# Patient Record
Sex: Male | Born: 1945
Health system: Southern US, Community
[De-identification: ages and names within clinical notes are randomized; demographics above are authoritative.]

## PROBLEM LIST (undated history)

## (undated) DIAGNOSIS — I219 Acute myocardial infarction, unspecified: Secondary | ICD-10-CM

## (undated) DIAGNOSIS — R7301 Impaired fasting glucose: Secondary | ICD-10-CM

## (undated) DIAGNOSIS — I872 Venous insufficiency (chronic) (peripheral): Secondary | ICD-10-CM

## (undated) DIAGNOSIS — M1990 Unspecified osteoarthritis, unspecified site: Secondary | ICD-10-CM

## (undated) DIAGNOSIS — G4733 Obstructive sleep apnea (adult) (pediatric): Secondary | ICD-10-CM

## (undated) DIAGNOSIS — F32A Depression, unspecified: Secondary | ICD-10-CM

## (undated) DIAGNOSIS — C4491 Basal cell carcinoma of skin, unspecified: Secondary | ICD-10-CM

## (undated) DIAGNOSIS — G609 Hereditary and idiopathic neuropathy, unspecified: Secondary | ICD-10-CM

## (undated) DIAGNOSIS — D472 Monoclonal gammopathy: Secondary | ICD-10-CM

## (undated) DIAGNOSIS — N63 Unspecified lump in unspecified breast: Secondary | ICD-10-CM

## (undated) DIAGNOSIS — K219 Gastro-esophageal reflux disease without esophagitis: Secondary | ICD-10-CM

## (undated) DIAGNOSIS — N200 Calculus of kidney: Secondary | ICD-10-CM

## (undated) DIAGNOSIS — K635 Polyp of colon: Secondary | ICD-10-CM

## (undated) DIAGNOSIS — G629 Polyneuropathy, unspecified: Secondary | ICD-10-CM

## (undated) DIAGNOSIS — U071 COVID-19: Secondary | ICD-10-CM

## (undated) DIAGNOSIS — M75100 Unspecified rotator cuff tear or rupture of unspecified shoulder, not specified as traumatic: Secondary | ICD-10-CM

## (undated) DIAGNOSIS — Z973 Presence of spectacles and contact lenses: Secondary | ICD-10-CM

## (undated) DIAGNOSIS — K5790 Diverticulosis of intestine, part unspecified, without perforation or abscess without bleeding: Secondary | ICD-10-CM

## (undated) DIAGNOSIS — I209 Angina pectoris, unspecified: Secondary | ICD-10-CM

## (undated) DIAGNOSIS — R7303 Prediabetes: Secondary | ICD-10-CM

## (undated) DIAGNOSIS — I10 Essential (primary) hypertension: Secondary | ICD-10-CM

## (undated) DIAGNOSIS — E785 Hyperlipidemia, unspecified: Secondary | ICD-10-CM

## (undated) DIAGNOSIS — E875 Hyperkalemia: Secondary | ICD-10-CM

## (undated) DIAGNOSIS — G4730 Sleep apnea, unspecified: Secondary | ICD-10-CM

## (undated) DIAGNOSIS — I2510 Atherosclerotic heart disease of native coronary artery without angina pectoris: Secondary | ICD-10-CM

## (undated) DIAGNOSIS — F329 Major depressive disorder, single episode, unspecified: Secondary | ICD-10-CM

## (undated) HISTORY — DX: Impaired fasting glucose: R73.01

## (undated) HISTORY — DX: COVID-19: U07.1

## (undated) HISTORY — DX: Hyperlipidemia, unspecified: E78.5

## (undated) HISTORY — DX: Polyneuropathy, unspecified: G62.9

## (undated) HISTORY — DX: Hyperkalemia: E87.5

## (undated) HISTORY — DX: Monoclonal gammopathy: D47.2

## (undated) HISTORY — DX: Diverticulosis of intestine, part unspecified, without perforation or abscess without bleeding: K57.90

## (undated) HISTORY — DX: Obstructive sleep apnea (adult) (pediatric): G47.33

## (undated) HISTORY — DX: Venous insufficiency (chronic) (peripheral): I87.2

## (undated) HISTORY — DX: Unspecified rotator cuff tear or rupture of unspecified shoulder, not specified as traumatic: M75.100

## (undated) HISTORY — DX: Prediabetes: R73.03

## (undated) HISTORY — DX: Essential (primary) hypertension: I10

## (undated) HISTORY — DX: Calculus of kidney: N20.0

## (undated) HISTORY — PX: BASAL CELL CARCINOMA EXCISION: SHX1214

## (undated) HISTORY — DX: Hereditary and idiopathic neuropathy, unspecified: G60.9

## (undated) HISTORY — DX: Gastro-esophageal reflux disease without esophagitis: K21.9

## (undated) HISTORY — PX: CARDIAC CATHETERIZATION: SHX172

## (undated) HISTORY — DX: Unspecified lump in unspecified breast: N63.0

## (undated) HISTORY — DX: Acute myocardial infarction, unspecified: I21.9

## (undated) HISTORY — DX: Polyp of colon: K63.5

---

## 1985-02-15 HISTORY — PX: VASECTOMY: SHX75

## 1993-02-12 HISTORY — PX: SPINE SURGERY: SHX786

## 2001-02-15 HISTORY — PX: ANGIOPLASTY: SHX39

## 2001-11-15 ENCOUNTER — Inpatient Hospital Stay (HOSPITAL_COMMUNITY): Admission: EM | Admit: 2001-11-15 | Discharge: 2001-11-18 | Payer: Self-pay | Admitting: Emergency Medicine

## 2001-11-15 ENCOUNTER — Encounter: Payer: Self-pay | Admitting: Interventional Cardiology

## 2001-11-15 DIAGNOSIS — I219 Acute myocardial infarction, unspecified: Secondary | ICD-10-CM

## 2001-11-15 HISTORY — DX: Acute myocardial infarction, unspecified: I21.9

## 2001-11-15 HISTORY — PX: CORONARY STENT PLACEMENT: SHX6853

## 2001-11-15 HISTORY — PX: OTHER SURGICAL HISTORY: SHX169

## 2001-12-04 ENCOUNTER — Encounter (HOSPITAL_COMMUNITY): Admission: RE | Admit: 2001-12-04 | Discharge: 2002-03-04 | Payer: Self-pay | Admitting: Interventional Cardiology

## 2003-04-30 ENCOUNTER — Ambulatory Visit (HOSPITAL_BASED_OUTPATIENT_CLINIC_OR_DEPARTMENT_OTHER): Admission: RE | Admit: 2003-04-30 | Discharge: 2003-04-30 | Payer: Self-pay | Admitting: Family Medicine

## 2003-06-17 HISTORY — PX: COLONOSCOPY: SHX174

## 2009-02-15 HISTORY — PX: SHOULDER ARTHROSCOPY W/ ROTATOR CUFF REPAIR: SHX2400

## 2010-01-13 ENCOUNTER — Ambulatory Visit (HOSPITAL_BASED_OUTPATIENT_CLINIC_OR_DEPARTMENT_OTHER): Admission: RE | Admit: 2010-01-13 | Discharge: 2010-01-13 | Payer: Self-pay | Admitting: Orthopedic Surgery

## 2010-04-28 LAB — BASIC METABOLIC PANEL
BUN: 23 mg/dL (ref 6–23)
CO2: 30 mEq/L (ref 19–32)
Calcium: 8.9 mg/dL (ref 8.4–10.5)
Chloride: 108 mEq/L (ref 96–112)
Creatinine, Ser: 0.96 mg/dL (ref 0.4–1.5)
GFR calc Af Amer: >60 mL/min (ref >60)
GFR calc non Af Amer: >60 mL/min (ref >60)
Glucose, Bld: 163 mg/dL — ABNORMAL HIGH (ref 70–99)
Potassium: 4.2 mEq/L (ref 3.5–5.1)
Sodium: 141 mEq/L (ref 135–145)

## 2010-04-28 LAB — POCT HEMOGLOBIN-HEMACUE: Hemoglobin: 14.2 g/dL (ref 13.0–17.0)

## 2010-07-03 NOTE — H&P (Signed)
NAME:  AH, BOTT NO.:  [id number]   MEDICAL RECORD NO.:  [id number]                   PATIENT TYPE:   LOCATION:                                       FACILITY:  [facility]   PHYSICIAN:  Lyn Records, M.D.                DATE OF BIRTH:  05-29-1945   DATE OF ADMISSION:  11/15/2001  DATE OF DISCHARGE:                                HISTORY & PHYSICAL   PRIMARY CARE [place]:  Dr. Chales Salmon. [place] [employer].   IMPRESSION:  (As dictated by Dr. Lyn Records.)  1. Symptoms suspicious for unstable angina pectoris in this 65 year old with     cardiac risk factors including borderline hypertension, dyslipidemia and     possible family history of CAD.  Discomfort is improved after sublingual     nitrates and heparin as well as aspirin.  He has electrocardiographic     changes suggestive of inferior/lateral ischemia.  No significant change     in serial electrocardiograms.  2. Dyslipidemia for which he is on Lipitor.  3. History of gastroesophageal reflux disease for which he is on Nexium.   PLAN:  (As dictated by Dr. Verdis Prime.)  1. Cardiac catheterization this afternoon with possible percutaneous     intervention if indicated and able.  The risks, potential complications,     benefits and alternatives to procedure were discussed in detail with the     patient by Dr. [name].  The patient and his wife indicated their questions     and concerns have been addressed and is agreeable to proceed.  2. IV Integrilin, IV heparin and IV nitroglycerin.  He is status post     aspirin, 5000 unit heparin bolus by EMS.   HISTORY OF PRESENT ILLNESS:  The patient is a very pleasant 65 year old male  with history of hypertension (borderline, untreated), dyslipidemia and  possible family history of CAD.  At 11 a.m., while aerating his lawn, he  developed new onset of upper chest tightness/pressure, may have had some  slight left finger  tingling.  He denied associated shortness of breath,  nausea, nor diaphoresis.  He presented to his primary care Kalise Fickett's office  where EKG revealed T wave abnormalities, III, slight ST depression, V4  through V6, and ST segment downsloping with associated T wave abnormalities,  I and aVL.  EMS was summoned and he received a total of three sublingual  nitrates and 5000 of IV heparin bolus with some easing of discomfort,  initially 7/10 and brought down to 2-3/10; in the ER, his discomfort is down  to less than 1/10.  He had received aspirin in primary M.D. clinic.  In the  emergency room, he has very mild tightness, residual, and is in no acute  distress; he is a little anxious.  His ST segment depression in V4 through  V6  seems improved.   PREVIOUS MEDICAL HISTORY:  1. Moderate hypertension, not treated.  2. Dyslipidemia, on Lipitor.  3. GERD, on Nexium.   PREVIOUS SURGICAL HISTORY:  1. Left lower extremity vein ligation a few weeks earlier.  2. History of lower back surgery, December 1994, lumbar area.   ALLERGIES:  No known drug allergies.  No problems with seafood, shellfish  nor with iodinated products.   MEDICATIONS:  A.  Zocor 50 mg p.o. every day.  B.  Nexium 40 mg p.o. every day.  C.  Lipitor 10 mg p.o. every day.  D.  Baby aspirin 81 mg a day.  E.  Vitamin E, vitamin B1 and multivitamin every day.   SOCIAL HISTORY AND HABITS:  Tobacco:  Very remote, none currently.  ETOH:  __________.  The patient is married with children.   FAMILY HISTORY:  Father died of suicide at age 5, did have complaints of  prior angina.  Mother is without CAD.  One sister alive and well.   REVIEW OF SYSTEMS:  As in HPI/previous medical history, otherwise, wears  glasses, hearing okay, episodic dysphagia, negative history of stomach  ulcers or bleeds, history of GERD for which he takes Nexium, nothing like  presenting symptoms.  Denies constipation, diarrhea, melena nor bright red  blood  PR.  Negative dysuria nor hematuria.  Denies pedal edema, orthopnea,  PND.  Negative DOE.   PHYSICAL EXAMINATION:  (As performed by Dr. Verdis Prime.)  VITAL SIGNS:  Blood pressure is 112/68, heart rate 69 and regular,  respiratory rate 12, temperature 98.2.  GENERAL:  In general, he is a well-nourished, pleasantly conversant 65-year-  old gentleman who is a little anxious.  His wife is in attendance.  HEENT/NECK:  Brisk bilateral carotid upstrokes without bruit.  No  significant JVD nor thyromegaly.  CHEST:  Scant left basilar crackles, otherwise, clear.  CARDIAC:  Regular rate and rhythm without murmur, rub nor gallop.  Normal S1  and S2.  ABDOMEN:  Soft, nondistended, normoactive bowel sounds.  Negative abdominal  aortic, renal nor femoral bruit.  Nontender to applied pressure.  No masses  nor organomegaly appreciated.  EXTREMITIES:  Distal pulses intact.  Negative pedal edema.  NEUROLOGIC:  Cranial nerves II-XII grossly intact, alert and oriented x3.  GENITAL:  Deferred.  RECTAL:  Deferred.   LABORATORY TESTS AND DATA:  EKG revealed NSR at 66 beats per minute with  slight septal (V1) ST curving; terminal T wave abnormality, V3 and aVL; ST  downsloping, I, aVL, with associated T wave abnormalities.  Followup EKG in  [facility] ER is about the same; there is some improvement in the ST slight  depression, lateral precordial leads, however.   His i-STAT 8, CMET, CBC, coagulations, cardiac enzymes are drawn and  pending.       Salomon Fick, N.P.                       Lyn Records, M.D.    MES/MEDQ  D:  11/15/2001  T:  11/15/2001  Job:  [id number]   cc:   Chales Salmon. Abigail Miyamoto, M.D.

## 2010-07-03 NOTE — Discharge Summary (Signed)
NAME:  Tim Walters, Tim Walters NO.:  [id number]   MEDICAL RECORD NO.:  [id number]                   PATIENT TYPE:  INP   LOCATION:  3738                                 FACILITY:  [facility]   PHYSICIAN:  Lyn Records, M.D.                DATE OF BIRTH:  1946/01/19   DATE OF ADMISSION:  11/15/2001  DATE OF DISCHARGE:  11/18/2001                                 DISCHARGE SUMMARY   PRIMARY CARE PHYSICIAN:  Chales Salmon. Abigail Miyamoto, M.D., [facility].   PROCEDURE:  November 15, 2001, stent(x2) RCA mid.  Residual 70 to 80% mid LAD,  85% diagonal I.  Left-to-right collateral flow to RCA territory.  Overall  normal LV function.   DISCHARGE DIAGNOSES:  1. Coronary atherosclerotic heart disease.     a. Status post acute inferior myocardial infarction with peak CK of 728,        MB 78, troponin I 13.83.     b. November 15, 2001, stent x2 mid right coronary artery.  Residual 70 to        80% mid left anterior descending, 85% diagonal I.  Preserved left        ventricular function.  2. History of dyslipidemia for which he is on Lipitor.  Lipid profile     revealing cholesterol 137, triglycerides 89, HDL 39, LDL 80.  3. History of gastroesophageal reflux disease for which he is on Nexium.   PLAN:  1. The patient is discharged home in stable condition.  2. Discharge medications:     a. (New) Plavix 75 mg one p.o. q.d. Take with food for one month. Do not        miss any doses.     b. (New) Nitroglycerin tablet 0.4 mg one sublingual p.r.n. chest pain, up        to three tablets in 15 minutes.     c. (Increased)  Enteric coated aspirin 325 mg once daily.     d. (New) Altace 2.5 mg one p.o. q.d.     e. (New) Coreg 6.25 mg one half tablet p.o. b.i.d.     f. Lipitor 10 mg per day.     g. Zoloft 50 mg per day.     h. Nexium 40 mg per day.        a. Multivitamins as before.  3. Activity:  As outlined by cardiac rehab nurse. No heavy exertion until     after  being seen by Dr. [name] in clinic.  4. He is to have follow-up Cardiolite (perfusion scan of his heart) to see     if further work is needed on LAD lesion.  5. Diet:  Low fat, low cholesterol, no added salt.  6. Wound care:  He may shower.  7. Special instructions:  The patient is to call our clinic if he develops a  large amount of swelling or bruising in the groin area.  8. Follow-up:  Lyn Records, M.D., on Wednesday, November 29, 2001, at     12:30 p.m.  At that time will hold discussion if the patient can go on     plane flights.  Will also discuss if the patient needs to postpone left     lower extremity vascular injections for varicose veins pending completion     of work on LAD if necessary.   HISTORY OF PRESENT ILLNESS:  [name]  with cardiac risk factors including borderline hypertension, dyslipidemia,  and possible family history for CAD.  He presented with symptoms suspicious  for unstable angina consisting of new onset upper chest tightness/pressure  with some associated left finger tingling.  There were EKG abnormalities  suggesting inferior/lateral ischemia but no significant change in serial  enzymes.  His discomfort improved after sublingual nitrates and heparin as  well as aspirin.  He was counseled and accepted plans for coronary  angiography with possible PCI.   HOSPITAL COURSE:  He was taken to the catheterization lab on November 15, 2001, the day of admission and underwent coronary angiography as detailed  below.  1. Left main:  Normal.  2. LAD:  70 to 80% mid.  Diagonal I:  85%.  3. Circumflex:  Luminal irregularities up to 40% mid.  4. RCA:  100% mid.  Collaterals left-to-right.   The patient underwent successful PTCA/stent x2 to the mid RCA with reduction  of stenosis from 100% to 0% and establishment of TIMI-3 flow.   The rest of the hospital admission was uneventful; no recurrent chest pain  suggestive of angina.   Right groin with slight ecchymosis and small  hematoma.  Beta-blocker and ACE inhibitor were added to his regimen and were  well tolerated hemodynamically.  Dr. [name] raises question of possible LAD  PCI if ischemia is apparent on follow-up Cardiolite and no RCA restenosis.  The patient can ambulate up and about with cardiac rehab and there are plans  for outpatient cardiac rehab program in his future.  He is discharged home  with further details as above.   PAST MEDICAL HISTORY:  As above.   PAST SURGICAL HISTORY:  Left lower extremity vein ligation a few weeks prior  to admission.  History of lower back surgery in December of 1994, lumbar  area.   LABORATORY DATA:  Admission wbc 9.1, hemoglobin 14.8, hematocrit 44.8,  platelets 193.  Differential significant for left shift, neutrophils 87%,  absolute neutrophils at 7.8 (very slightly elevated).  Admission pro time  13.6, INR 1, PTT 113.  Sodium 138, potassium 3.9, chloride 104, CO2 28,  glucose 195; 107 on November 17, 2001.  BUN 17, creatinine 1.1 and stable  during the course of admission.  LFTs were within normal range.  First CK  276, MB fraction 4.6, troponin I 0.02.  Second CK 619, MB fraction 76.6,  troponin I 7.82.  Third CK 728, MB 78, troponin I 13.83.  Fourth MB 333, MB  15.3.  Lipid profile revealed cholesterol 137, triglycerides 89, HDL 39, LDL  80.   Admission chest x-ray revealed coarse bibasilar markings without acute or  superimposed abnormality.   Admission EKG revealed NSR at 56 beats per minute with slight septal (V1) S2  curving; terminal Q-wave abnormality, V3 and aVL; ST down sloping I, aVL  with associated T-wave abnormalities.  Follow-up EKG in [facility]  Room about the same though seemed improvement in the ST slight depression in  the lateral precordial leads, however.   Total time preparing this discharge was greater than 40 minutes including dictating this discharge summary, filling out and  reviewing discharge  instructions with the patient, providing and calling in prescriptions, and  arranging follow-up office visit.    Salomon Fick, N.P.                       Lyn Records, M.D.   MES/MEDQ  D:  12/13/2001  T:  12/14/2001  Job:  [id number]   cc:   Chales Salmon. Abigail Miyamoto, M.D.

## 2010-07-03 NOTE — Cardiovascular Report (Signed)
NAME:  Tim Walters, Tim Walters NO.:  [id number]   MEDICAL RECORD NO.:  [id number]                   PATIENT TYPE:  INP   LOCATION:  1825                                 FACILITY:  [facility]   PHYSICIAN:  Lesleigh Noe, M.D.            DATE OF BIRTH:  [birth year]   DATE OF PROCEDURE:  11/15/2001  DATE OF DISCHARGE:                              CARDIAC CATHETERIZATION   INDICATIONS FOR PROCEDURE:  Acute coronary syndrome with prolonged chest  pain and possible non-Q-wave myocardial infarction.   PROCEDURE PERFORMED:  1. Left heart catheterization.  2. Selective coronary angiography.  3. Left ventriculography.  4. Percutaneous transluminal coronary angioplasty and stent of totally     occluded acutely, right coronary.   DESCRIPTION OF PROCEDURE:  After informed consent, the patient was brought  to the catheterization lab under emergency circumstances.  A #6 French  sheath was started in the right femoral artery using the modified Seldinger  technique.  A #6 French A2 Multipurpose catheter was used for hemodynamic  recordings, left ventriculography, and selective left and right coronary  angiography.  After reviewing the cineangiograms, it was determined that the  right coronary was acutely occluded.   We felt that percutaneous intervention was necessary on the right coronary.  He received a double bolus followed by an infusion of Integrilin.  An  additional 3000 units of heparin was given.  ACT during the procedure was  292 seconds.  The ACT post procedure was 233 seconds.  We performed  angioplasty using a #6 French sidehole guide catheter, JR4.  A BMW wire was  used to cross the total occlusion.  A 2.5 x 20-mm long Maverick balloon was  used for predilatation and two 2.75-mm diameter, 20-mm long Zeta stents were  deployed, each for 13 atmospheres which was an effective diameter of 2.93  mm.  The stents overlapped in the mid vessel.  TIMI grade 3 flow  was  reestablished.  The result was quite nice with 0% stenosis noted.   RESULTS:  1. Hemodynamic data     A. Aortic pressure:  125/61.     B. Left ventricular pressure:  124/12 mmHg.  2. Left ventriculography:  The left ventricle was normal in size.  Overall     contractility was normal.  No significant wall motion abnormalities     noted.  3. Coronary angiography     A. Left main coronary:  The left main coronary artery was free of any        significant obstruction.     B. Left anterior descending coronary:  The left anterior descending        coronary artery contains 80% proximal/mid narrowing.  A large second        diagonal also contains high-grade obstruction.  A third diagonal is        also severely diseased.  The LAD in the  distal segment contains 80%        stenosis.     C. Circumflex artery:  The circumflex coronary artery is large.  It gives        a large bifurcating system to the inferolateral wall.  Irregularities        are noted in the proximal circumflex but no high-grade obstruction is        felt to be present in the circumflex or the marginals.     D. Right coronary:  Totally occluded in the mid vessel.  4. Percutaneous intervention:  The right coronary, totally occluded in the     mid vessel, reduced to 0% with TIMI grade 3 flow after percutaneous     intervention with overlapping stent implantation.   CONCLUSION:  1. Acute inferior infarction.  Total occlusion of the right coronary.  2. Successful percutaneous coronary intervention on the right coronary with     reduction in stenosis from 100% to 0% with TIMI grade 3 flow.  3. Moderate to severe proximal/mid left anterior descending artery and     diagonal.  4. Overall normal left ventricular function.   PLAN:  Medical therapy.  Look for restenosis of the right coronary.  If  develops restenosis, consider coronary artery bypass grafting.  If the right  coronary stays patent, consider intervention on the  right coronary.                                               Lesleigh Noe, M.D.    HWS/MEDQ  D:  11/15/2001  T:  11/19/2001  Job:  [id number]   cc:   Chales Salmon. Abigail Miyamoto, M.D.   Cardiac Catheterization Lab

## 2012-02-15 ENCOUNTER — Ambulatory Visit (INDEPENDENT_AMBULATORY_CARE_PROVIDER_SITE_OTHER): Payer: 59 | Admitting: *Deleted

## 2012-02-15 DIAGNOSIS — Z23 Encounter for immunization: Secondary | ICD-10-CM

## 2013-01-05 ENCOUNTER — Ambulatory Visit (INDEPENDENT_AMBULATORY_CARE_PROVIDER_SITE_OTHER): Payer: Medicare Other | Admitting: General Surgery

## 2013-01-15 ENCOUNTER — Encounter (INDEPENDENT_AMBULATORY_CARE_PROVIDER_SITE_OTHER): Payer: Self-pay | Admitting: General Surgery

## 2013-01-15 ENCOUNTER — Ambulatory Visit (INDEPENDENT_AMBULATORY_CARE_PROVIDER_SITE_OTHER): Payer: Medicare Other | Admitting: General Surgery

## 2013-01-15 ENCOUNTER — Encounter (INDEPENDENT_AMBULATORY_CARE_PROVIDER_SITE_OTHER): Payer: Self-pay

## 2013-01-15 VITALS — BP 142/84 | HR 62 | Temp 98.6°F | Resp 16 | Ht 67.5 in | Wt 196.6 lb

## 2013-01-15 DIAGNOSIS — K4090 Unilateral inguinal hernia, without obstruction or gangrene, not specified as recurrent: Secondary | ICD-10-CM | POA: Insufficient documentation

## 2013-01-15 DIAGNOSIS — I252 Old myocardial infarction: Secondary | ICD-10-CM | POA: Insufficient documentation

## 2013-01-15 DIAGNOSIS — I10 Essential (primary) hypertension: Secondary | ICD-10-CM

## 2013-01-15 DIAGNOSIS — L723 Sebaceous cyst: Secondary | ICD-10-CM | POA: Insufficient documentation

## 2013-01-15 DIAGNOSIS — I2510 Atherosclerotic heart disease of native coronary artery without angina pectoris: Secondary | ICD-10-CM | POA: Insufficient documentation

## 2013-01-15 DIAGNOSIS — F329 Major depressive disorder, single episode, unspecified: Secondary | ICD-10-CM

## 2013-01-15 NOTE — Patient Instructions (Signed)
We have discussed management of the sebaceous cyst of the left upper back and the left inguinal hernia.  We have decided to schedule elective surgery to excise the sebaceous cyst on your back under monitored sedation as an outpatient surgery.  We will not plan any surgery on the left inguinal hernia at this time, although we recognize that it will probably become necessary in the future.      Epidermal Cyst An epidermal cyst is sometimes called a sebaceous cyst, epidermal inclusion cyst, or infundibular cyst. These cysts usually contain a substance that looks "pasty" or "cheesy" and may have a bad smell. This substance is a protein called keratin. Epidermal cysts are usually found on the face, neck, or trunk. They may also occur in the vaginal area or other parts of the genitalia of both men and women. Epidermal cysts are usually small, painless, slow-growing bumps or lumps that move freely under the skin. It is important not to try to pop them. This may cause an infection and lead to tenderness and swelling. CAUSES  Epidermal cysts may be caused by a deep penetrating injury to the skin or a plugged hair follicle, often associated with acne. SYMPTOMS  Epidermal cysts can become inflamed and cause:  Redness.  Tenderness.  Increased temperature of the skin over the bumps or lumps.  Grayish-white, bad smelling material that drains from the bump or lump. DIAGNOSIS  Epidermal cysts are easily diagnosed by your caregiver during an exam. Rarely, a tissue sample (biopsy) may be taken to rule out other conditions that may resemble epidermal cysts. TREATMENT   Epidermal cysts often get better and disappear on their own. They are rarely ever cancerous.  If a cyst becomes infected, it may become inflamed and tender. This may require opening and draining the cyst. Treatment with antibiotics may be necessary. When the infection is gone, the cyst may be removed with minor surgery.  Small, inflamed  cysts can often be treated with antibiotics or by injecting steroid medicines.  Sometimes, epidermal cysts become large and bothersome. If this happens, surgical removal in your caregiver's office may be necessary. HOME CARE INSTRUCTIONS  Only take over-the-counter or prescription medicines as directed by your caregiver.  Take your antibiotics as directed. Finish them even if you start to feel better. SEEK MEDICAL CARE IF:   Your cyst becomes tender, red, or swollen.  Your condition is not improving or is getting worse.  You have any other questions or concerns. MAKE SURE YOU:  Understand these instructions.  Will watch your condition.  Will get help right away if you are not doing well or get worse. Document Released: 01/03/2004 Document Revised: 04/26/2011 Document Reviewed: 08/10/2010 [facility] Patient Information 2014 [place], [place].       Inguinal Hernia, Adult Muscles help keep everything in the body in its proper place. But if a weak spot in the muscles develops, something can poke through. That is called a hernia. When this happens in the lower part of the belly (abdomen), it is called an inguinal hernia. (It takes its name from a part of the body in this region called the inguinal canal.) A weak spot in the wall of muscles lets some fat or part of the small intestine bulge through. An inguinal hernia can develop at any age. Men get them more often than women. CAUSES  In adults, an inguinal hernia develops over time.  It can be triggered by:  Suddenly straining the muscles of the lower abdomen.  Lifting  heavy objects.  Straining to have a bowel movement. Difficult bowel movements (constipation) can lead to this.  Constant coughing. This may be caused by smoking or lung disease.  Being overweight.  Being pregnant.  Working at a job that requires long periods of standing or heavy lifting.  Having had an inguinal hernia before. One type can be an emergency  situation. It is called a strangulated inguinal hernia. It develops if part of the small intestine slips through the weak spot and cannot get back into the abdomen. The blood supply can be cut off. If that happens, part of the intestine may die. This situation requires emergency surgery. SYMPTOMS  Often, a small inguinal hernia has no symptoms. It is found when a healthcare provider does a physical exam. Larger hernias usually have symptoms.   In adults, symptoms may include:  A lump in the groin. This is easier to see when the person is standing. It might disappear when lying down.  In men, a lump in the scrotum.  Pain or burning in the groin. This occurs especially when lifting, straining or coughing.  A dull ache or feeling of pressure in the groin.  Signs of a strangulated hernia can include:  A bulge in the groin that becomes very painful and tender to the touch.  A bulge that turns red or purple.  Fever, nausea and vomiting.  Inability to have a bowel movement or to pass gas. DIAGNOSIS  To decide if you have an inguinal hernia, a healthcare provider will probably do a physical examination.  This will include asking questions about any symptoms you have noticed.  The healthcare provider might feel the groin area and ask you to cough. If an inguinal hernia is felt, the healthcare provider may try to slide it back into the abdomen.  Usually no other tests are needed. TREATMENT  Treatments can vary. The size of the hernia makes a difference. Options include:  Watchful waiting. This is often suggested if the hernia is small and you have had no symptoms.  No medical procedure will be done unless symptoms develop.  You will need to watch closely for symptoms. If any occur, contact your healthcare provider right away.  Surgery. This is used if the hernia is larger or you have symptoms.  Open surgery. This is usually an outpatient procedure (you will not stay overnight in a  hospital). An cut (incision) is made through the skin in the groin. The hernia is put back inside the abdomen. The weak area in the muscles is then repaired by herniorrhaphy or hernioplasty. Herniorrhaphy: in this type of surgery, the weak muscles are sewn back together. Hernioplasty: a patch or mesh is used to close the weak area in the abdominal wall.  Laparoscopy. In this procedure, a surgeon makes small incisions. A thin tube with a tiny video camera (called a laparoscope) is put into the abdomen. The surgeon repairs the hernia with mesh by looking with the video camera and using two long instruments. HOME CARE INSTRUCTIONS   After surgery to repair an inguinal hernia:  You will need to take pain medicine prescribed by your healthcare provider. Follow all directions carefully.  You will need to take care of the wound from the incision.  Your activity will be restricted for awhile. This will probably include no heavy lifting for several weeks. You also should not do anything too active for a few weeks. When you can return to work will depend on the type of  job that you have.  During "watchful waiting" periods, you should:  Maintain a healthy weight.  Eat a diet high in fiber (fruits, vegetables and whole grains).  Drink plenty of fluids to avoid constipation. This means drinking enough water and other liquids to keep your urine clear or pale yellow.  Do not lift heavy objects.  Do not stand for long periods of time.  Quit smoking. This should keep you from developing a frequent cough. SEEK MEDICAL CARE IF:   A bulge develops in your groin area.  You feel pain, a burning sensation or pressure in the groin. This might be worse if you are lifting or straining.  You develop a fever of more than 100.5 F (38.1 C). SEEK IMMEDIATE MEDICAL CARE IF:   Pain in the groin increases suddenly.  A bulge in the groin gets bigger suddenly and does not go down.  For men, there is sudden pain  in the scrotum. Or, the size of the scrotum increases.  A bulge in the groin area becomes red or purple and is painful to touch.  You have nausea or vomiting that does not go away.  You feel your heart beating much faster than normal.  You cannot have a bowel movement or pass gas.  You develop a fever of more than 102.0 F (38.9 C). Document Released: 06/20/2008 Document Revised: 04/26/2011 Document Reviewed: 06/20/2008 [facility] Patient Information 2014 [place], [place].

## 2013-01-15 NOTE — Progress Notes (Signed)
Patient ID: Tim Walters, male   DOB: 06-23-1945, 67 y.o.   MRN: [id number]  Chief Complaint  Patient presents with  . Mass    back mass  . Inguinal Hernia    HPI EUCLID Walters is a 67 y.o. male.  He is referred by Dr. Catha Gosselin for ligation of an asymptomatic subcutaneous hernia and a symptomatic sebaceous cyst of the upper left back. Dr. Verdis Prime is his cardiologist.  This gentleman had has noticed a enlarging cyst on his left upper back for 3 years. He says his wife occasionally presses on it to get material out of it but still growing. He would like this removed. A recent physical exam Dr. Clarene Duke identified a reducible left inguinal hernia and wanted that evaluated as well.  Comorbidities include myocardial infarction and coronary artery disease with stent placement in the RCA. Dr. [name] sees him annually. Hypertension. Unspecified sleep apnea. Hyperlipidemia. Depression. He has no history of prior hernia.  HPI  Past Medical History  Diagnosis Date  . GERD (gastroesophageal reflux disease)   . Hyperlipidemia   . Hypertension   . Myocardial infarction 11/15/2001    Dr. Verdis Prime [facility])    Past Surgical History  Procedure Laterality Date  . Joint replacement  01/13/2010    Right Rotator Cuff  . Spine surgery  02/12/1993    L2, L3 fragmented disc  . Vasectomy  1987  . Coronary artery stent  11/15/2001    No family history on file.  Social History History  Substance Use Topics  . Smoking status: Never Smoker   . Smokeless tobacco: Not on file  . Alcohol Use: .5 - 1 oz/week    1-2 drink(s) per week    No Known Allergies  Current Outpatient Prescriptions  Medication Sig Dispense Refill  . atorvastatin (LIPITOR) 40 MG tablet       . carvedilol (COREG) 12.5 MG tablet       . OMEPRAZOLE PO Take 20 mg by mouth.      . ramipril (ALTACE) 2.5 MG capsule       . sertraline (ZOLOFT) 100 MG tablet        No current facility-administered medications for  this visit.    Review of Systems Review of Systems  Constitutional: Negative for fever, chills and unexpected weight change.  HENT: Negative for congestion, hearing loss, sore throat, trouble swallowing and voice change.   Eyes: Negative for visual disturbance.  Respiratory: Negative for cough and wheezing.   Cardiovascular: Negative for chest pain, palpitations and leg swelling.  Gastrointestinal: Negative for nausea, vomiting, abdominal pain, diarrhea, constipation, blood in stool, abdominal distention, anal bleeding and rectal pain.  Genitourinary: Negative for hematuria and difficulty urinating.  Musculoskeletal: Negative for arthralgias.  Skin: Negative for rash and wound.  Neurological: Negative for seizures, syncope, weakness and headaches.  Hematological: Negative for adenopathy. Does not bruise/bleed easily.  Psychiatric/Behavioral: Negative for confusion.    Blood pressure 142/84, pulse 62, temperature 98.6 F (37 C), temperature source Temporal, resp. rate 16, height 5' 7.5" (1.715 m), weight 196 lb 9.6 oz (89.177 kg).  Physical Exam Physical Exam  Constitutional: He is oriented to person, place, and time. He appears well-developed and well-nourished. No distress.  HENT:  Head: Normocephalic.  Nose: Nose normal.  Mouth/Throat: No oropharyngeal exudate.  Eyes: Conjunctivae and EOM are normal. Pupils are equal, round, and reactive to light. Right eye exhibits no discharge. Left eye exhibits no discharge. No scleral icterus.  Neck: Normal range of motion. Neck supple. No JVD present. No tracheal deviation present. No thyromegaly present.  Cardiovascular: Normal rate, regular rhythm, normal heart sounds and intact distal pulses.   No murmur heard. Pulmonary/Chest: Effort normal and breath sounds normal. No stridor. No respiratory distress. He has no wheezes. He has no rales. He exhibits no tenderness.  2.5 cm multilobulated subcutaneous mass left upper back. Not infected.  This has the physical characteristics of an epidermoid cyst.  Abdominal: Soft. Bowel sounds are normal. He exhibits no distension and no mass. There is no tenderness. There is no rebound and no guarding.  Umbilicus normal. No umbilical hernia.  Genitourinary:  Small to medium sized left inguinal hernia. Easily reducible. No evidence of hernia on the right. Penis scrotum testes are normal. No scrotal mass.  Musculoskeletal: Normal range of motion. He exhibits no edema and no tenderness.  Lymphadenopathy:    He has no cervical adenopathy.  Neurological: He is alert and oriented to person, place, and time. He has normal reflexes. Coordination normal.  Skin: Skin is warm and dry. No rash noted. He is not diaphoretic. No erythema. No pallor.  Psychiatric: He has a normal mood and affect. His behavior is normal. Judgment and thought content normal.    Data Reviewed Office notes from Dr. Clarene Duke.  Assessment    Symptomatic, enlarging epidermoid cyst left upper back.  Reducible left inguinal hernia. Not symptomatic.  Coronary artery disease  History myocardial infarction with stent placement  Hypertension  Unspecified sleep apnea  Depression  Hyperlipidemia     Plan    We talked about the sebaceous cyst and the hernia. It was his desire to get the cyst taken care of first and do the hernia at a  later date. That is very appropriate.  He was scheduled for elective excision of sebaceous cyst of his left upper back in the prone position sometime early next year after [place]. He would like to be sedated for this which is reasonable. We will try to set this up at [facility]  day surgery center as outpatient surgery  I discussed the indications, details, techniques, numerous risk of the surgery with him. He understands all these issues. At this time all of his questions are answered. It is planned.  Also spent a long time talking to her about about the natural history  of his inguinal hernia. I gave him patient information booklets with diagrams. He understands all this and we'll consider having his hernia repair as an open procedure in the future.        Angelia Mould. Derrell Lolling, M.D., [facility] General and Minimally invasive Surgery Breast and Colorectal Surgery Office:   [phone] Pager:   [phone]  01/15/2013, 3:53 PM

## 2013-05-03 ENCOUNTER — Encounter (HOSPITAL_BASED_OUTPATIENT_CLINIC_OR_DEPARTMENT_OTHER): Payer: Self-pay | Admitting: *Deleted

## 2013-05-03 NOTE — Progress Notes (Signed)
05/03/13 1637  OBSTRUCTIVE SLEEP APNEA  Have you ever been diagnosed with sleep apnea through a sleep study? No  Do you snore loudly (loud enough to be heard through closed doors)?  1  Do you often feel tired, fatigued, or sleepy during the daytime? 0  Has anyone observed you stop breathing during your sleep? 0  Do you have, or are you being treated for high blood pressure? 1  BMI more than 35 kg/m2? 0  Age over 70 years old? 1  Neck circumference greater than 40 cm/18 inches? 0  Gender: 1  Obstructive Sleep Apnea Score 4  Score 4 or greater  Results sent to PCP   

## 2013-05-03 NOTE — Progress Notes (Signed)
To come in for bmet-had ekg dr Tamala Julian 4/14- Was here 2011 for shoulder-stayed rcc-did well

## 2013-05-03 NOTE — Progress Notes (Signed)
05/03/13 1637  OBSTRUCTIVE SLEEP APNEA  Have you ever been diagnosed with sleep apnea through a sleep study? No  Do you snore loudly (loud enough to be heard through closed doors)?  1  Do you often feel tired, fatigued, or sleepy during the daytime? 0  Has anyone observed you stop breathing during your sleep? 0  Do you have, or are you being treated for high blood pressure? 1  BMI more than 35 kg/m2? 0  Age over 70 years old? 1  Neck circumference greater than 40 cm/18 inches? 0  Gender: 1  Obstructive Sleep Apnea Score 4  Score 4 or greater  Results sent to PCP

## 2013-05-07 ENCOUNTER — Encounter (HOSPITAL_BASED_OUTPATIENT_CLINIC_OR_DEPARTMENT_OTHER)
Admission: RE | Admit: 2013-05-07 | Discharge: 2013-05-07 | Disposition: A | Discharge status: Home / Self Care | Payer: Medicare Other | Source: Ambulatory Visit | Attending: General Surgery | Admitting: General Surgery

## 2013-05-07 LAB — BASIC METABOLIC PANEL
BUN: 26 mg/dL — ABNORMAL HIGH (ref 6–23)
CO2: 26 mEq/L (ref 19–32)
Calcium: 9.6 mg/dL (ref 8.4–10.5)
Chloride: 103 mEq/L (ref 96–112)
Creatinine, Ser: 0.89 mg/dL (ref 0.50–1.35)
GFR calc Af Amer: >90 mL/min (ref >90)
GFR calc non Af Amer: 85 mL/min — ABNORMAL LOW (ref >90)
Glucose, Bld: 100 mg/dL — ABNORMAL HIGH (ref 70–99)
Potassium: 4.6 mEq/L (ref 3.7–5.3)
Sodium: 140 mEq/L (ref 137–147)

## 2013-05-07 NOTE — H&P (Signed)
Tim Walters   MRN:  [id number]   Description: 68 year old male  Provider: Adin Hector, MD  Department: [facility]           Diagnoses      Sebaceous cyst    -  Primary      706.2      Left inguinal hernia          550.90      History of myocardial infarction          18      Essential hypertension, benign          401.1      Depression          311      Coronary atherosclerosis of native coronary artery          414.01             Reason for Visit      Mass      back mass      Inguinal Hernia              Current Vitals - Last Recorded      BP Pulse Temp(Src) Resp Ht Wt      142/84 62 98.6 F (37 C) (Temporal) 16 5' 7.5" (1.715 m) 196 lb 9.6 oz (89.177 kg)            BMI  30.32 kg/m2                      History and Physical     Adin Hector, MD      Status: Signed            Patient ID: Tim Walters, male   DOB: 1945-02-25, 68 y.o.   MRN: [id number]                 HPI Tim Walters is a 68 y.o. male.  He is referred by Dr. Hulan Fess for ligation of an asymptomatic subcutaneous hernia and a symptomatic sebaceous cyst of the upper left back. Dr. Daneen Schick is his cardiologist.   This gentleman had has noticed a enlarging cyst on his left upper back for 3 years. He says his wife occasionally presses on it to get material out of it but still growing. He would like this removed. A recent physical exam Dr. Rex Kras identified a reducible left inguinal hernia and wanted that evaluated as well.   Comorbidities include myocardial infarction and coronary artery disease with stent placement in the RCA. Dr. Tamala Julian sees him annually. Hypertension. Unspecified sleep apnea. Hyperlipidemia. Depression. He has no history of prior hernia.         Past Medical History   Diagnosis  Date   .  GERD (gastroesophageal reflux disease)     .  Hyperlipidemia     .  Hypertension     .  Myocardial infarction  11/15/2001       Dr. Daneen Schick  [facility])         Past Surgical History   Procedure  Laterality  Date   .  Joint replacement    01/13/2010       Right Rotator Cuff   .  Spine surgery    02/12/1993       L2, L3 fragmented disc   .  Vasectomy    1987   .  Coronary artery stent  11/15/2001        No family history on file.   Social History History   Substance Use Topics   .  Smoking status:  Never Smoker    .  Smokeless tobacco:  Not on file   .  Alcohol Use:  .5 - 1 oz/week       1-2 drink(s) per week        No Known Allergies    Current Outpatient Prescriptions   Medication  Sig  Dispense  Refill   .  atorvastatin (LIPITOR) 40 MG tablet           .  carvedilol (COREG) 12.5 MG tablet           .  OMEPRAZOLE PO  Take 20 mg by mouth.         .  ramipril (ALTACE) 2.5 MG capsule           .  sertraline (ZOLOFT) 100 MG tablet               No current facility-administered medications for this visit.        Review of Systems   Constitutional: Negative for fever, chills and unexpected weight change.  HENT: Negative for congestion, hearing loss, sore throat, trouble swallowing and voice change.   Eyes: Negative for visual disturbance.  Respiratory: Negative for cough and wheezing.   Cardiovascular: Negative for chest pain, palpitations and leg swelling.  Gastrointestinal: Negative for nausea, vomiting, abdominal pain, diarrhea, constipation, blood in stool, abdominal distention, anal bleeding and rectal pain.  Genitourinary: Negative for hematuria and difficulty urinating.  Musculoskeletal: Negative for arthralgias.  Skin: Negative for rash and wound.  Neurological: Negative for seizures, syncope, weakness and headaches.  Hematological: Negative for adenopathy. Does not bruise/bleed easily.  Psychiatric/Behavioral: Negative for confusion.      Blood pressure 142/84, pulse 62, temperature 98.6 F (37 C), temperature source Temporal, resp. rate 16, height 5' 7.5" (1.715 m), weight  196 lb 9.6 oz (89.177 kg).   Physical Exam  Constitutional: He is oriented to person, place, and time. He appears well-developed and well-nourished. No distress.  HENT:   Head: Normocephalic.   Nose: Nose normal.   Mouth/Throat: No oropharyngeal exudate.  Eyes: Conjunctivae and EOM are normal. Pupils are equal, round, and reactive to light. Right eye exhibits no discharge. Left eye exhibits no discharge. No scleral icterus.  Neck: Normal range of motion. Neck supple. No JVD present. No tracheal deviation present. No thyromegaly present.  Cardiovascular: Normal rate, regular rhythm, normal heart sounds and intact distal pulses.    No murmur heard. Pulmonary/Chest: Effort normal and breath sounds normal. No stridor. No respiratory distress. He has no wheezes. He has no rales. He exhibits no tenderness.  2.5 cm multilobulated subcutaneous mass left upper back. Not infected. This has the physical characteristics of an epidermoid cyst.  Abdominal: Soft. Bowel sounds are normal. He exhibits no distension and no mass. There is no tenderness. There is no rebound and no guarding.  Umbilicus normal. No umbilical hernia.  Genitourinary:  Small to medium sized left inguinal hernia. Easily reducible. No evidence of hernia on the right. Penis scrotum testes are normal. No scrotal mass.  Musculoskeletal: Normal range of motion. He exhibits no edema and no tenderness.  Lymphadenopathy:    He has no cervical adenopathy.  Neurological: He is alert and oriented to person, place, and time. He has normal reflexes. Coordination normal.  Skin: Skin is warm and dry. No rash  noted. He is not diaphoretic. No erythema. No pallor.  Psychiatric: He has a normal mood and affect. His behavior is normal. Judgment and thought content normal.      Data Reviewed Office notes from Dr. Rex Kras.   Assessment    Symptomatic, enlarging epidermoid cyst left upper back.   Reducible left inguinal hernia. Not  symptomatic.   Coronary artery disease   History myocardial infarction with stent placement   Hypertension   Unspecified sleep apnea   Depression   Hyperlipidemia      Plan    We talked about the sebaceous cyst and the hernia. It was his desire to get the cyst taken care of first and do the hernia at a  later date. That is very appropriate.   He was scheduled for elective excision of sebaceous cyst of his left upper back in the prone position sometime early next year after [place] returns [place]. He would like to be sedated for this which is reasonable. We will try to set this up at [facility]  day surgery center as outpatient surgery   I discussed the indications, details, techniques, numerous risk of the surgery with him. He understands all these issues. At this time all of his questions are answered. It is planned.   Also spent a long time talking to her about about the natural history of his inguinal hernia. I gave him patient information booklets with diagrams. He understands all this and we'll consider having his hernia repair as an open procedure in the future.           Edsel Petrin. Dalbert Batman, M.D., [facility] General and Minimally invasive Surgery Breast and Colorectal Surgery Office:   [phone] Pager:   [phone]

## 2013-05-08 ENCOUNTER — Encounter (HOSPITAL_BASED_OUTPATIENT_CLINIC_OR_DEPARTMENT_OTHER)
Admission: RE | Disposition: A | Discharge status: Home / Self Care | Payer: Self-pay | Source: Ambulatory Visit | Attending: General Surgery

## 2013-05-08 ENCOUNTER — Encounter (HOSPITAL_BASED_OUTPATIENT_CLINIC_OR_DEPARTMENT_OTHER): Payer: Medicare Other | Admitting: Anesthesiology

## 2013-05-08 ENCOUNTER — Encounter (HOSPITAL_BASED_OUTPATIENT_CLINIC_OR_DEPARTMENT_OTHER): Payer: Self-pay | Admitting: *Deleted

## 2013-05-08 ENCOUNTER — Ambulatory Visit (HOSPITAL_BASED_OUTPATIENT_CLINIC_OR_DEPARTMENT_OTHER)
Admission: RE | Admit: 2013-05-08 | Discharge: 2013-05-08 | Disposition: A | Discharge status: Home / Self Care | Payer: Medicare Other | Source: Ambulatory Visit | Attending: General Surgery | Admitting: General Surgery

## 2013-05-08 ENCOUNTER — Ambulatory Visit (HOSPITAL_BASED_OUTPATIENT_CLINIC_OR_DEPARTMENT_OTHER): Payer: Medicare Other | Admitting: Anesthesiology

## 2013-05-08 DIAGNOSIS — K219 Gastro-esophageal reflux disease without esophagitis: Secondary | ICD-10-CM | POA: Insufficient documentation

## 2013-05-08 DIAGNOSIS — K4090 Unilateral inguinal hernia, without obstruction or gangrene, not specified as recurrent: Secondary | ICD-10-CM | POA: Insufficient documentation

## 2013-05-08 DIAGNOSIS — L723 Sebaceous cyst: Secondary | ICD-10-CM

## 2013-05-08 DIAGNOSIS — I10 Essential (primary) hypertension: Secondary | ICD-10-CM | POA: Insufficient documentation

## 2013-05-08 DIAGNOSIS — G4730 Sleep apnea, unspecified: Secondary | ICD-10-CM | POA: Insufficient documentation

## 2013-05-08 DIAGNOSIS — Z9660 Presence of unspecified orthopedic joint implant: Secondary | ICD-10-CM | POA: Insufficient documentation

## 2013-05-08 DIAGNOSIS — E785 Hyperlipidemia, unspecified: Secondary | ICD-10-CM | POA: Insufficient documentation

## 2013-05-08 DIAGNOSIS — F329 Major depressive disorder, single episode, unspecified: Secondary | ICD-10-CM | POA: Insufficient documentation

## 2013-05-08 DIAGNOSIS — Z01812 Encounter for preprocedural laboratory examination: Secondary | ICD-10-CM | POA: Insufficient documentation

## 2013-05-08 DIAGNOSIS — Z9861 Coronary angioplasty status: Secondary | ICD-10-CM | POA: Insufficient documentation

## 2013-05-08 DIAGNOSIS — I252 Old myocardial infarction: Secondary | ICD-10-CM | POA: Insufficient documentation

## 2013-05-08 DIAGNOSIS — I2510 Atherosclerotic heart disease of native coronary artery without angina pectoris: Secondary | ICD-10-CM | POA: Insufficient documentation

## 2013-05-08 DIAGNOSIS — F3289 Other specified depressive episodes: Secondary | ICD-10-CM | POA: Insufficient documentation

## 2013-05-08 HISTORY — DX: Presence of spectacles and contact lenses: Z97.3

## 2013-05-08 HISTORY — DX: Major depressive disorder, single episode, unspecified: F32.9

## 2013-05-08 HISTORY — DX: Unspecified osteoarthritis, unspecified site: M19.90

## 2013-05-08 HISTORY — PX: CYST REMOVAL TRUNK: SHX6283

## 2013-05-08 HISTORY — DX: Atherosclerotic heart disease of native coronary artery without angina pectoris: I25.10

## 2013-05-08 HISTORY — DX: Depression, unspecified: F32.A

## 2013-05-08 HISTORY — DX: Sleep apnea, unspecified: G47.30

## 2013-05-08 LAB — POCT HEMOGLOBIN-HEMACUE: Hemoglobin: 15.9 g/dL (ref 13.0–17.0)

## 2013-05-08 SURGERY — CYST REMOVAL TRUNK
Anesthesia: Monitor Anesthesia Care | Laterality: Left

## 2013-05-08 MED ORDER — PROPOFOL INFUSION 10 MG/ML OPTIME
INTRAVENOUS | Status: DC | PRN
  Administered 2013-05-08: 100 ug/kg/min via INTRAVENOUS

## 2013-05-08 MED ORDER — FENTANYL CITRATE 0.05 MG/ML IJ SOLN: 50.0000 ug | INTRAMUSCULAR | Status: DC | PRN

## 2013-05-08 MED ORDER — MIDAZOLAM HCL 5 MG/5ML IJ SOLN
INTRAMUSCULAR | Status: DC | PRN
  Administered 2013-05-08 (×2): 1 mg via INTRAVENOUS

## 2013-05-08 MED ORDER — SODIUM BICARBONATE 4 % IV SOLN
INTRAVENOUS | Status: DC | PRN
  Administered 2013-05-08: 5 mL via INTRAVENOUS

## 2013-05-08 MED ORDER — FENTANYL CITRATE 0.05 MG/ML IJ SOLN
INTRAMUSCULAR | Status: DC | PRN
  Administered 2013-05-08 (×2): 50 ug via INTRAVENOUS

## 2013-05-08 MED ORDER — LIDOCAINE-EPINEPHRINE (PF) 1 %-1:200000 IJ SOLN
INTRAMUSCULAR | Status: DC | PRN
  Administered 2013-05-08: 17 mL

## 2013-05-08 MED ORDER — OXYCODONE HCL 5 MG/5ML PO SOLN: 5.0000 mg | Freq: Once | ORAL | Status: DC | PRN

## 2013-05-08 MED ORDER — LIDOCAINE HCL (CARDIAC) 20 MG/ML IV SOLN
INTRAVENOUS | Status: DC | PRN
  Administered 2013-05-08: 50 mg via INTRAVENOUS

## 2013-05-08 MED ORDER — CEFAZOLIN SODIUM-DEXTROSE 2-3 GM-% IV SOLR
INTRAVENOUS | Status: DC | PRN
  Administered 2013-05-08: 2 g via INTRAVENOUS

## 2013-05-08 MED ORDER — LIDOCAINE HCL (PF) 1 % IJ SOLN
INTRAMUSCULAR | Status: AC
  Filled 2013-05-08: qty 30

## 2013-05-08 MED ORDER — HYDROMORPHONE HCL PF 1 MG/ML IJ SOLN: 0.2500 mg | INTRAMUSCULAR | Status: DC | PRN

## 2013-05-08 MED ORDER — BUPIVACAINE-EPINEPHRINE PF 0.5-1:200000 % IJ SOLN
INTRAMUSCULAR | Status: AC
  Filled 2013-05-08: qty 30

## 2013-05-08 MED ORDER — LACTATED RINGERS IV SOLN
INTRAVENOUS | Status: DC
  Administered 2013-05-08 (×2): via INTRAVENOUS

## 2013-05-08 MED ORDER — FENTANYL CITRATE 0.05 MG/ML IJ SOLN
INTRAMUSCULAR | Status: AC
  Filled 2013-05-08: qty 4

## 2013-05-08 MED ORDER — HYDROCODONE-ACETAMINOPHEN 5-325 MG PO TABS: 1.0000 | ORAL_TABLET | Freq: Four times a day (QID) | ORAL | Status: DC | PRN

## 2013-05-08 MED ORDER — OXYCODONE HCL 5 MG PO TABS: 5.0000 mg | ORAL_TABLET | Freq: Once | ORAL | Status: DC | PRN

## 2013-05-08 MED ORDER — MIDAZOLAM HCL 2 MG/2ML IJ SOLN
INTRAMUSCULAR | Status: AC
  Filled 2013-05-08: qty 2

## 2013-05-08 MED ORDER — SODIUM BICARBONATE 4 % IV SOLN
INTRAVENOUS | Status: AC
  Filled 2013-05-08: qty 5

## 2013-05-08 MED ORDER — MIDAZOLAM HCL 2 MG/2ML IJ SOLN: 1.0000 mg | INTRAMUSCULAR | Status: DC | PRN

## 2013-05-08 SURGICAL SUPPLY — 54 items
BANDAGE ELASTIC 6 VELCRO ST LF (GAUZE/BANDAGES/DRESSINGS) IMPLANT
BENZOIN TINCTURE PRP APPL 2/3 (GAUZE/BANDAGES/DRESSINGS) IMPLANT
BLADE HEX COATED 2.75 (ELECTRODE) ×3 IMPLANT
BLADE SURG 15 STRL LF DISP TIS (BLADE) ×1 IMPLANT
BLADE SURG 15 STRL SS (BLADE) ×2
CANISTER SUCT 1200ML W/VALVE (MISCELLANEOUS) IMPLANT
CHLORAPREP W/TINT 26ML (MISCELLANEOUS) ×3 IMPLANT
CLOSURE WOUND 1/2 X4 (GAUZE/BANDAGES/DRESSINGS)
COVER MAYO STAND STRL (DRAPES) ×3 IMPLANT
COVER TABLE BACK 60X90 (DRAPES) ×3 IMPLANT
DECANTER SPIKE VIAL GLASS SM (MISCELLANEOUS) IMPLANT
DERMABOND ADVANCED (GAUZE/BANDAGES/DRESSINGS)
DERMABOND ADVANCED .7 DNX12 (GAUZE/BANDAGES/DRESSINGS) IMPLANT
DRAPE LAPAROTOMY TRNSV 102X78 (DRAPE) IMPLANT
DRAPE PED LAPAROTOMY (DRAPES) ×3 IMPLANT
DRAPE UTILITY XL STRL (DRAPES) ×3 IMPLANT
ELECT REM PT RETURN 9FT ADLT (ELECTROSURGICAL) ×3
ELECTRODE REM PT RTRN 9FT ADLT (ELECTROSURGICAL) ×1 IMPLANT
GAUZE PACKING IODOFORM 1/4X15 (GAUZE/BANDAGES/DRESSINGS) ×3 IMPLANT
GAUZE SPONGE 4X4 16PLY XRAY LF (GAUZE/BANDAGES/DRESSINGS) ×3 IMPLANT
GLOVE EUDERMIC 7 POWDERFREE (GLOVE) ×3 IMPLANT
GLOVE SURG SS PI 7.0 STRL IVOR (GLOVE) ×3 IMPLANT
GOWN STRL REUS W/ TWL LRG LVL3 (GOWN DISPOSABLE) ×1 IMPLANT
GOWN STRL REUS W/ TWL XL LVL3 (GOWN DISPOSABLE) ×1 IMPLANT
GOWN STRL REUS W/TWL LRG LVL3 (GOWN DISPOSABLE) ×2
GOWN STRL REUS W/TWL XL LVL3 (GOWN DISPOSABLE) ×2
NEEDLE HYPO 22GX1.5 SAFETY (NEEDLE) IMPLANT
NEEDLE HYPO 25X1 1.5 SAFETY (NEEDLE) ×3 IMPLANT
NS IRRIG 1000ML POUR BTL (IV SOLUTION) ×3 IMPLANT
PACK BASIN DAY SURGERY FS (CUSTOM PROCEDURE TRAY) ×3 IMPLANT
PENCIL BUTTON HOLSTER BLD 10FT (ELECTRODE) ×3 IMPLANT
SLEEVE SCD COMPRESS KNEE MED (MISCELLANEOUS) IMPLANT
SPONGE GAUZE 4X4 12PLY STER LF (GAUZE/BANDAGES/DRESSINGS) IMPLANT
SPONGE LAP 4X18 X RAY DECT (DISPOSABLE) IMPLANT
STAPLER VISISTAT 35W (STAPLE) IMPLANT
STRIP CLOSURE SKIN 1/2X4 (GAUZE/BANDAGES/DRESSINGS) IMPLANT
SUT ETHILON 3 0 FSL (SUTURE) ×3 IMPLANT
SUT ETHILON 4 0 PS 2 18 (SUTURE) IMPLANT
SUT MNCRL AB 4-0 PS2 18 (SUTURE) IMPLANT
SUT SILK 2 0 SH (SUTURE) ×3 IMPLANT
SUT VIC AB 2-0 SH 27 (SUTURE)
SUT VIC AB 2-0 SH 27XBRD (SUTURE) IMPLANT
SUT VIC AB 3-0 FS2 27 (SUTURE) IMPLANT
SUT VIC AB 4-0 P-3 18XBRD (SUTURE) IMPLANT
SUT VIC AB 4-0 P3 18 (SUTURE)
SUT VICRYL 3-0 CR8 SH (SUTURE) ×3 IMPLANT
SUT VICRYL 4-0 PS2 18IN ABS (SUTURE) IMPLANT
SYR BULB 3OZ (MISCELLANEOUS) IMPLANT
SYR CONTROL 10ML LL (SYRINGE) ×3 IMPLANT
TAPE HYPAFIX 4 X10 (GAUZE/BANDAGES/DRESSINGS) IMPLANT
TOWEL OR NON WOVEN STRL DISP B (DISPOSABLE) ×3 IMPLANT
TUBE CONNECTING 20'X1/4 (TUBING)
TUBE CONNECTING 20X1/4 (TUBING) IMPLANT
YANKAUER SUCT BULB TIP NO VENT (SUCTIONS) IMPLANT

## 2013-05-08 NOTE — Transfer of Care (Signed)
Immediate Anesthesia Transfer of Care Note  Patient: Tim Walters  Procedure(s) Performed: Procedure(s): CYST REMOVAL BACK (Left)  Patient Location: PACU  Anesthesia Type:MAC  Level of Consciousness: awake and alert   Airway & Oxygen Therapy: Patient Spontanous Breathing and Patient connected to face mask oxygen  Post-op Assessment: Report given to PACU RN and Post -op Vital signs reviewed and stable  Post vital signs: Reviewed and stable  Complications: No apparent anesthesia complications

## 2013-05-08 NOTE — Discharge Instructions (Signed)
Keep the wound clean and dry for 48-hour period.    change the dry gauze bandage if necessary.  On Thursday, remove the bandage and pulled the  wicks out of the wound and discarded. Do not repack the wound. At that point simply cover the wound with dry 4 x 4 gauze.  You may start taking a shower on Thursday night  Return to see Dr. Dalbert Batman in 10-14 days to get the sutures removed.  No sports or dirty activities for 2 weeks. You may walk as much as you wish. You may drive your car starting tomorrow.   Post Anesthesia Home Care Instructions  Activity: Get plenty of rest for the remainder of the day. A responsible adult should stay with you for 24 hours following the procedure.  For the next 24 hours, DO NOT: -Drive a car -Paediatric nurse -Drink alcoholic beverages -Take any medication unless instructed by your physician -Make any legal decisions or sign important papers.  Meals: Start with liquid foods such as gelatin or soup. Progress to regular foods as tolerated. Avoid greasy, spicy, heavy foods. If nausea and/or vomiting occur, drink only clear liquids until the nausea and/or vomiting subsides. Call your physician if vomiting continues.  Special Instructions/Symptoms: Your throat may feel dry or sore from the anesthesia or the breathing tube placed in your throat during surgery. If this causes discomfort, gargle with warm salt water. The discomfort should disappear within 24 hours.

## 2013-05-08 NOTE — Interval H&P Note (Signed)
History and Physical Interval Note:  05/08/2013 11:18 AM  Tim Walters  has presented today for surgery, with the diagnosis of back cyst  The various methods of treatment have been discussed with the patient and family. After consideration of risks, benefits and other options for treatment, the patient has consented to  Procedure(s): CYST REMOVAL BACK (N/A) as a surgical intervention .  [name] history has been reviewed, patient examined, no change in status, stable for surgery.  I have reviewed the patient's chart and labs.  Questions were answered to the patient's satisfaction.     Adin Hector

## 2013-05-08 NOTE — Anesthesia Postprocedure Evaluation (Signed)
  Anesthesia Post-op Note  Patient: Tim Walters  Procedure(s) Performed: Procedure(s): CYST REMOVAL BACK (Left)  Patient Location: PACU  Anesthesia Type: MAC  Level of Consciousness: awake and alert   Airway and Oxygen Therapy: Patient Spontanous Breathing  Post-op Pain: none  Post-op Assessment: Post-op Vital signs reviewed, Patient's Cardiovascular Status Stable and Respiratory Function Stable  Post-op Vital Signs: Reviewed  Filed Vitals:   05/08/13 1315  BP: 138/74  Pulse: 53  Temp:   Resp: 12    Complications: No apparent anesthesia complications

## 2013-05-08 NOTE — Anesthesia Preprocedure Evaluation (Addendum)
Anesthesia Evaluation  Patient identified by MRN, date of birth, ID band Patient awake    Reviewed: Allergy & Precautions, H&P , NPO status , Patient's Chart, lab work & pertinent test results, reviewed documented beta blocker date and time   Airway Mallampati: III TM Distance: >3 FB Neck ROM: Full    Dental no notable dental hx. (+) Teeth Intact, Dental Advisory Given   Pulmonary sleep apnea ,  breath sounds clear to auscultation  Pulmonary exam normal       Cardiovascular hypertension, On Medications and On Home Beta Blockers + CAD and + Past MI Rhythm:Regular Rate:Normal     Neuro/Psych PSYCHIATRIC DISORDERS Depression negative neurological ROS     GI/Hepatic Neg liver ROS, GERD-  Medicated and Controlled,  Endo/Other  negative endocrine ROS  Renal/GU negative Renal ROS  negative genitourinary   Musculoskeletal   Abdominal   Peds  Hematology negative hematology ROS (+)   Anesthesia Other Findings   Reproductive/Obstetrics negative OB ROS                         Anesthesia Physical Anesthesia Plan  ASA: III  Anesthesia Plan: MAC   Post-op Pain Management:    Induction: Intravenous  Airway Management Planned: Simple Face Mask  Additional Equipment:   Intra-op Plan:   Post-operative Plan:   Informed Consent: I have reviewed the patients History and Physical, chart, labs and discussed the procedure including the risks, benefits and alternatives for the proposed anesthesia with the patient or authorized representative who has indicated his/her understanding and acceptance.   Dental advisory given  Plan Discussed with: CRNA  Anesthesia Plan Comments:         Anesthesia Quick Evaluation

## 2013-05-08 NOTE — Op Note (Signed)
Patient Name:           Tim Walters   Date of Surgery:        05/08/2013  Pre op Diagnosis:      3 cm deep subcutaneous mass, left upper back, suspect epidermoid cyst  Post op Diagnosis:    Same  Procedure:                  3 cm soft tissue mass left upper back  Surgeon:                     Edsel Petrin. Dalbert Batman, M.D., FACS  Assistant:                      None  Operative Indications:   Tim Walters is a 68 y.o. male. He is referred by Dr. Hulan Fess for evaluation of  a symptomatic sebaceous cyst of the upper left back. Dr. Daneen Schick is his cardiologist.  This gentleman had has noticed a enlarging cyst on his left upper back for 3 years. He says his wife occasionally presses on it to get material out of it but still growing. He would like this removed.  Comorbidities include myocardial infarction and coronary artery disease with stent placement in the RCA. Dr. Tamala Julian sees him annually. Hypertension. Unspecified sleep apnea. Hyperlipidemia. Depression. He has no history of prior hernia.    Operative Findings:       To adequately excise this area with primary closure and I made a 5 cm long by 2 cm wide incision entered the dissection all the way down to the trapezius fascia. The specimen was removed intact  Procedure in Detail:          The patient was placed prone on the operating table. He was monitored and sedated by the anesthesia department. Surgical time out performed. The upper back was prepped and draped in a sterile fashion. 1% Xylocaine with epinephrine was used as a local infiltration anesthetic. An oblique elliptical incision was made as described above. Dissection was carried down into the subcutaneous tissue and around the subcutaneous mass. The mass went  all the way down to the trapezius fascia. This was sent to the laboratory. Hemostasis was excellent and achieved with  electrocautery. Wound irrigated with saline. I closed the wound with 3 interrupted sutures of 3-0 nylon and  placed iodoform wicks in between the sutures. A bandage was placed the patient taken to PACU in stable condition. EBL 5 cc. Counts correct. Palpitations.     Edsel Petrin. Dalbert Batman, M.D., FACS General and Minimally Invasive Surgery Breast and Colorectal Surgery  05/08/2013 1:02 PM

## 2013-05-09 ENCOUNTER — Encounter (HOSPITAL_BASED_OUTPATIENT_CLINIC_OR_DEPARTMENT_OTHER): Payer: Self-pay | Admitting: General Surgery

## 2013-05-09 ENCOUNTER — Telehealth (INDEPENDENT_AMBULATORY_CARE_PROVIDER_SITE_OTHER): Payer: Self-pay | Admitting: *Deleted

## 2013-05-09 NOTE — Telephone Encounter (Signed)
Let me see him April 6 at 3:15 PM, just before the new patient clinic.   hmi

## 2013-05-09 NOTE — Telephone Encounter (Signed)
Patient called to schedule PO appt in 11 days.  He states that is what he was told in the hospital.  I am unable to find an appt that will work with that time frame so I explained that I would send Dr. Dalbert Batman and Tilda Burrow a message to see what they can find.  Patient states understanding and agreeable with the plan at this time.

## 2013-05-09 NOTE — Telephone Encounter (Signed)
Called patient to inform him of the below appt time and scheduled the appt

## 2013-05-21 ENCOUNTER — Encounter (INDEPENDENT_AMBULATORY_CARE_PROVIDER_SITE_OTHER): Payer: Self-pay | Admitting: General Surgery

## 2013-05-21 ENCOUNTER — Ambulatory Visit (INDEPENDENT_AMBULATORY_CARE_PROVIDER_SITE_OTHER): Payer: Medicare Other | Admitting: General Surgery

## 2013-05-21 VITALS — BP 122/76 | HR 70 | Temp 98.4°F | Resp 16 | Ht 67.5 in | Wt 200.6 lb

## 2013-05-21 DIAGNOSIS — K4090 Unilateral inguinal hernia, without obstruction or gangrene, not specified as recurrent: Secondary | ICD-10-CM

## 2013-05-21 NOTE — Patient Instructions (Signed)
You are healing from the back surgery without any problems.  Call Dr. Dalbert Batman when you are ready to have the left inguinal hernia repaired.

## 2013-05-21 NOTE — Progress Notes (Signed)
Patient ID: Tim Walters, male   DOB: [birth year], 68 y.o.   MRN: [id number] History: Patient underwent excision of a histologically confirmed epidermoid cyst from his upper back on 05/08/2013. He's had no problems with healing. He states that his wife took the sutures out this weekend. He states that his left inguinal hernia is not bothering him and he wants to put off repair in that, at least for now  Exam: Patient looks well. Good spirits. Incisional upper back is healing normally. Tissues are soft. No infection. No hematoma. Left inguinal hernia is small to medium size. Does not extend into the scrotum. Reducible.  Assessment: 3 cm sebaceous cyst left upper back, recovering uneventfully following excision Left inguinal hernia, essentially asymptomatic  Plan: Wound care discussed Return to see me when he is ready to have his left inguinal hernia repaired. I told him that if it enlarges or cause any pain he should come in for repeat exam. Otherwise see me as needed   Edsel Petrin. Dalbert Batman, M.D., [facility] General and Minimally invasive Surgery Breast and Colorectal Surgery Office:   [phone] Pager:   [phone]

## 2013-05-24 ENCOUNTER — Encounter (INDEPENDENT_AMBULATORY_CARE_PROVIDER_SITE_OTHER): Payer: Medicare Other | Admitting: General Surgery

## 2013-05-29 ENCOUNTER — Other Ambulatory Visit: Payer: Self-pay | Admitting: Interventional Cardiology

## 2013-06-06 ENCOUNTER — Encounter: Payer: Self-pay | Admitting: Interventional Cardiology

## 2013-06-06 ENCOUNTER — Ambulatory Visit (INDEPENDENT_AMBULATORY_CARE_PROVIDER_SITE_OTHER): Payer: Medicare Other | Admitting: Interventional Cardiology

## 2013-06-06 VITALS — BP 122/74 | HR 52 | Ht 67.5 in | Wt 199.0 lb

## 2013-06-06 DIAGNOSIS — E785 Hyperlipidemia, unspecified: Secondary | ICD-10-CM | POA: Insufficient documentation

## 2013-06-06 DIAGNOSIS — I2510 Atherosclerotic heart disease of native coronary artery without angina pectoris: Secondary | ICD-10-CM

## 2013-06-06 DIAGNOSIS — R42 Dizziness and giddiness: Secondary | ICD-10-CM

## 2013-06-06 DIAGNOSIS — G4733 Obstructive sleep apnea (adult) (pediatric): Secondary | ICD-10-CM | POA: Insufficient documentation

## 2013-06-06 DIAGNOSIS — I10 Essential (primary) hypertension: Secondary | ICD-10-CM

## 2013-06-06 NOTE — Progress Notes (Signed)
Patient ID: Tim Walters, male   DOB: 04-28-1945, 68 y.o.   MRN: [id number]    [address] [place], [place]  [address] Phone: [phone] Fax:  [phone]  Date:  06/06/2013   ID:  Tim Walters, DOB [birth year], MRN [id number]  PCP:  Gennette Pac, MD   ASSESSMENT:  1. Coronary artery disease 2. Hyperlipidemia 3. Hypertension 4. Prior myocardial infarction 5. Occasional episodes of unexplained diaphoresis and does  PLAN:  1. Exercise treadmill test 2. No change in current medical regimen 3. Clinical followup in one year assuming no abnormalities on stress testing   SUBJECTIVE: Tim Walters is a 68 y.o. male was doing well. He complains of occasionally he will have spontaneous diaphoresis feel lightheaded. This can normal up to an hour. There is no associated chest discomfort. He has no palpitations or history of arrhythmia. He has never fainted. There is no associated nausea.  He denies chest pain. He remains active. He goes to the gym to 3 times per week. On occasion exercises associated with diaphoresis that he complains of. There is no associated discomfort.   Wt Readings from Last 3 Encounters:  06/06/13 199 lb (90.266 kg)  05/21/13 200 lb 9.6 oz (90.992 kg)  05/08/13 199 lb 4 oz (90.379 kg)     Past Medical History  Diagnosis Date  . GERD (gastroesophageal reflux disease)   . Hyperlipidemia   . Hypertension   . Myocardial infarction 11/15/2001    Dr. Daneen Schick [facility])  . Depression   . Arthritis   . Coronary artery disease   . Wears glasses   . Sleep apnea     had test several yr ago-said he did not need a cpap-still snores    Current Outpatient Prescriptions  Medication Sig Dispense Refill  . aspirin 325 MG tablet Take 325 mg by mouth daily.      Marland Kitchen atorvastatin (LIPITOR) 40 MG tablet       . carvedilol (COREG) 12.5 MG tablet Take 1 tablet by mouth  twice a day  180 tablet  0  . OMEPRAZOLE PO Take 20 mg by mouth every  morning.       . ramipril (ALTACE) 2.5 MG capsule Take 1 capsule by mouth  once a day  90 capsule  0  . sertraline (ZOLOFT) 100 MG tablet Take 1/2 tablet daily      . metroNIDAZOLE (METROGEL) 0.75 % gel        No current facility-administered medications for this visit.    Allergies:   No Known Allergies  Social History:  The patient  reports that he has never smoked. He does not have any smokeless tobacco history on file. He reports that he drinks about .5 - 1 ounces of alcohol per week.   ROS:  Please see the history of present illness.   Gaining weight. Appetite is good. No claudication.   All other systems reviewed and negative.   OBJECTIVE: VS:  BP 122/74  Pulse 52  Ht 5' 7.5" (1.715 m)  Wt 199 lb (90.266 kg)  BMI 30.69 kg/m2 Well nourished, well developed, in no acute distress, compatible with stated age 60: normal Neck: JVD flat. Carotid bruit absent  Cardiac:  normal S1, S2; RRR; no murmur Lungs:  clear to auscultation bilaterally, no wheezing, rhonchi or rales Abd: soft, nontender, no hepatomegaly Ext: Edema absent. Pulses 2+ Skin: warm and dry Neuro:  CNs 2-12 intact, no focal abnormalities noted  EKG:  Normal       Signed, Illene Labrador III, MD 06/06/2013 10:56 AM

## 2013-06-06 NOTE — Patient Instructions (Signed)
Your physician recommends that you continue on your current medications as directed. Please refer to the Current Medication list given to you today.  Your physician has requested that you have an exercise tolerance test. For further information please visit [email or web address]. Please also follow instruction sheet, as given.   Your physician wants you to follow-up in: 1 year You will receive a reminder letter in the mail two months in advance. If you don't receive a letter, please call our office to schedule the follow-up appointment.  

## 2013-07-17 ENCOUNTER — Ambulatory Visit (INDEPENDENT_AMBULATORY_CARE_PROVIDER_SITE_OTHER): Payer: Medicare Other | Admitting: Physician Assistant

## 2013-07-17 DIAGNOSIS — I2510 Atherosclerotic heart disease of native coronary artery without angina pectoris: Secondary | ICD-10-CM

## 2013-07-17 NOTE — Progress Notes (Signed)
Exercise Treadmill Test  Pre-Exercise Testing Evaluation Rhythm: sinus bradycardia  Rate: 57 bpm     Test  Exercise Tolerance Test Ordering MD: Daneen Schick, MD  Interpreting MD: Richardson Dopp, PA-C  Unique Test No: 1  Treadmill:  1  Indication for ETT: known ASHD  Contraindication to ETT: No   Stress Modality: exercise - treadmill  Cardiac Imaging Performed: non   Protocol: standard Bruce - maximal  Max BP:  210/101  Max MPHR (bpm):  151 85% MPR (bpm):  128  MPHR obtained (bpm):  129 % MPHR obtained:  85  Reached 85% MPHR (min:sec):  10:49 Total Exercise Time (min-sec):  11:00  Workload in METS:  13.4 Borg Scale: 17  Reason ETT Terminated:  desired heart rate attained    ST Segment Analysis At Rest: normal ST segments - no evidence of significant ST depression With Exercise: non-specific ST changes  Other Information Arrhythmia:  No Angina during ETT:  absent (0) Quality of ETT:  diagnostic  ETT Interpretation:  normal - no evidence of ischemia by ST analysis  Comments: Excellent exercise capacity. No chest pain. Normal BP response to exercise. Non-specific ST changes.  No ST changes to suggest ischemia.  Frequent PVCs in recovery. Resolved by end of test.   Recommendations: F/u with Dr. Daneen Schick as directed. Signed, Richardson Dopp, PA-C   07/17/2013 11:10 AM

## 2013-07-24 ENCOUNTER — Encounter: Payer: Self-pay | Admitting: Interventional Cardiology

## 2013-10-21 ENCOUNTER — Other Ambulatory Visit: Payer: Self-pay | Admitting: Interventional Cardiology

## 2014-03-21 ENCOUNTER — Other Ambulatory Visit: Payer: Self-pay

## 2014-03-21 MED ORDER — RAMIPRIL 2.5 MG PO CAPS: ORAL_CAPSULE | ORAL | Status: DC

## 2014-03-21 MED ORDER — CARVEDILOL 12.5 MG PO TABS: ORAL_TABLET | ORAL | Status: DC

## 2014-06-11 ENCOUNTER — Encounter: Payer: Self-pay | Admitting: Interventional Cardiology

## 2014-06-11 ENCOUNTER — Ambulatory Visit (INDEPENDENT_AMBULATORY_CARE_PROVIDER_SITE_OTHER): Payer: Medicare Other | Admitting: Interventional Cardiology

## 2014-06-11 VITALS — BP 132/76 | HR 59 | Ht 67.5 in | Wt 197.0 lb

## 2014-06-11 DIAGNOSIS — I252 Old myocardial infarction: Secondary | ICD-10-CM | POA: Diagnosis not present

## 2014-06-11 DIAGNOSIS — I2510 Atherosclerotic heart disease of native coronary artery without angina pectoris: Secondary | ICD-10-CM | POA: Diagnosis not present

## 2014-06-11 DIAGNOSIS — E785 Hyperlipidemia, unspecified: Secondary | ICD-10-CM | POA: Diagnosis not present

## 2014-06-11 DIAGNOSIS — I10 Essential (primary) hypertension: Secondary | ICD-10-CM | POA: Diagnosis not present

## 2014-06-11 DIAGNOSIS — G4733 Obstructive sleep apnea (adult) (pediatric): Secondary | ICD-10-CM

## 2014-06-11 MED ORDER — ASPIRIN 81 MG PO TABS: 81.0000 mg | ORAL_TABLET | Freq: Every day | ORAL | Status: DC

## 2014-06-11 NOTE — Patient Instructions (Signed)
Medication Instructions:  Your physician has recommended you make the following change in your medication:  REDUCE Aspirin to 81mg  daily  Labwork: None   Testing/Procedures: None   Follow-Up: Your physician wants you to follow-up in: 1 year with Dr.Smith You will receive a reminder letter in the mail two months in advance. If you don't receive a letter, please call our office to schedule the follow-up appointment.   Any Other Special Instructions Will Be Listed Below (If Applicable).

## 2014-06-11 NOTE — Progress Notes (Signed)
Cardiology Office Note   Date:  06/11/2014   ID:  Tim Walters, DOB [birth year], MRN [id number]  PCP:  Gennette Pac, MD  Cardiologist:   Sinclair Grooms, MD   Chief Complaint  Patient presents with  . Coronary Artery Disease      History of Present Illness: Tim Walters is a 69 y.o. male who presents for coronary artery disease, acute inferior infarction treated with bare-metal stent 2003.  Tim Walters is doing well. He has not had angina. He denies exertional dyspnea, palpitations, and syncope. He has noted lower extremity edema left leg greater than the right. What is present today is trace to 1+ ankle edema. This is as worse as he gets according to the patient. He denies orthopnea.    Past Medical History  Diagnosis Date  . GERD (gastroesophageal reflux disease)   . Hyperlipidemia   . Hypertension   . Myocardial infarction 11/15/2001    Dr. Daneen Schick [facility])  . Depression   . Arthritis   . Coronary artery disease   . Wears glasses   . Sleep apnea     had test several yr ago-said he did not need a cpap-still snores    Past Surgical History  Procedure Laterality Date  . Spine surgery  02/12/1993    L2, L3 fragmented disc  . Vasectomy  1987  . Coronary artery stent  11/15/2001  . Cardiac catheterization  [address]003    stent rca  . Shoulder arthroscopy w/ rotator cuff repair  2011    right  . Colonoscopy    . Cyst removal trunk Left 05/08/2013    Procedure: CYST REMOVAL BACK;  Surgeon: Adin Hector, MD;  Location: [facility];  Service: General;  Laterality: Left;     Current Outpatient Prescriptions  Medication Sig Dispense Refill  . aspirin 81 MG tablet Take 1 tablet (81 mg total) by mouth daily.    Marland Kitchen atorvastatin (LIPITOR) 40 MG tablet Take 40 mg by mouth daily.     . carvedilol (COREG) 12.5 MG tablet Take 1 tablet by mouth  twice a day 60 tablet 6  . metroNIDAZOLE (METROGEL) 0.75 % gel Apply 1 application topically as needed  (ACNE ROSACEA).     . OMEPRAZOLE PO Take 20 mg by mouth every morning.     . ramipril (ALTACE) 2.5 MG capsule Take 1 capsule by mouth  once a day 30 capsule 6  . sertraline (ZOLOFT) 100 MG tablet Take 1/2 tablet daily     No current facility-administered medications for this visit.    Allergies:   Review of patient's allergies indicates no known allergies.    Social History:  The patient  reports that he has quit smoking. He does not have any smokeless tobacco history on file. He reports that he drinks about 0.6 - 1.2 oz of alcohol per week.   Family History:  [name] family history includes COPD in his mother; Healthy in his sister; Hypertension in his father; Macular degeneration in his mother; Non-Hodgkin's lymphoma in his mother.    ROS:  Please see the history of present illness.   Otherwise, review of systems are positive for easy bruising, bilateral left greater than right trace ankle edema.   All other systems are reviewed and negative.    PHYSICAL EXAM: VS:  BP 132/76 mmHg  Pulse 59  Ht 5' 7.5" (1.715 m)  Wt 197 lb (89.359 kg)  BMI 30.38 kg/m2 , BMI Body  mass index is 30.38 kg/(m^2). GEN: Well nourished, well developed, in no acute distress HEENT: normal Neck: no JVD, carotid bruits, or masses Cardiac: RRR; no murmurs, rubs, or gallops,no edema  Respiratory:  clear to auscultation bilaterally, normal work of breathing GI: soft, nontender, nondistended, + BS MS: no deformity or atrophy Skin: warm and dry, no rash Neuro:  Strength and sensation are intact Psych: euthymic mood, full affect   EKG:  EKG is ordered today. The ekg ordered today demonstrates sinus bradycardia but otherwise normal   Recent Labs: No results found for requested labs within last 365 days.    Lipid Panel No results found for: CHOL, TRIG, HDL, CHOLHDL, VLDL, LDLCALC, LDLDIRECT    Wt Readings from Last 3 Encounters:  06/11/14 197 lb (89.359 kg)  06/06/13 199 lb (90.266 kg)    05/21/13 200 lb 9.6 oz (90.992 kg)      Other studies Reviewed: Additional studies/ records that were reviewed today include: .   ASSESSMENT AND PLAN:  Atherosclerosis of native coronary artery of native heart without angina pectoris  Essential hypertension, benign: Stable and well controlled  History of myocardial infarction  Hyperlipidemia  Obstructive sleep apnea      Current medicines are reviewed at length with the patient today.  The patient has concerns regarding medicines.  The following changes have been made:  The patient wondered about bruising on his extremities with minimal trauma. We will decrease aspirin to 81 mg daily.  Labs/ tests ordered today include:   Orders Placed This Encounter  Procedures  . EKG 12-Lead     Disposition:   FU with HS in 1 year  Signed, Sinclair Grooms, MD  06/11/2014 4:06 PM    [facility] [place], [place], [place]  [address] Phone: [phone]; Fax: [phone]

## 2014-09-30 ENCOUNTER — Other Ambulatory Visit: Payer: Self-pay

## 2014-09-30 MED ORDER — RAMIPRIL 2.5 MG PO CAPS: ORAL_CAPSULE | ORAL | Status: DC

## 2014-09-30 MED ORDER — CARVEDILOL 12.5 MG PO TABS: ORAL_TABLET | ORAL | Status: DC

## 2014-09-30 NOTE — Telephone Encounter (Signed)
Tim Crome, MD at 06/11/2014 3:50 PM   carvedilol (COREG) 12.5 MG tablet Take 1 tablet by mouth twice a day          ramipril (ALTACE) 2.5 MG capsule Take 1 capsule by mouth once a day         Current medicines are reviewed at length with the patient today. The patient has concerns regarding medicines.  The following changes have been made: The patient wondered about bruising on his extremities with minimal trauma. We will decrease aspirin to 81 mg daily.

## 2015-02-14 ENCOUNTER — Other Ambulatory Visit: Payer: Self-pay | Admitting: General Surgery

## 2015-02-19 ENCOUNTER — Other Ambulatory Visit: Payer: Self-pay | Admitting: Interventional Cardiology

## 2015-02-24 ENCOUNTER — Telehealth: Payer: Self-pay | Admitting: Interventional Cardiology

## 2015-02-24 NOTE — Telephone Encounter (Signed)
New message      Calling to get the status on a clearance for surgery from Dr Dalbert Batman sent to Dr Tamala Julian for his hernia surgery.  They have not received it back.  Did we get it?

## 2015-02-26 NOTE — Telephone Encounter (Signed)
Signed cardiac clearance faxed to [facility]

## 2015-03-04 ENCOUNTER — Encounter (HOSPITAL_BASED_OUTPATIENT_CLINIC_OR_DEPARTMENT_OTHER): Payer: Self-pay | Admitting: *Deleted

## 2015-03-18 ENCOUNTER — Encounter (HOSPITAL_BASED_OUTPATIENT_CLINIC_OR_DEPARTMENT_OTHER): Payer: Self-pay | Admitting: *Deleted

## 2015-03-19 ENCOUNTER — Encounter (HOSPITAL_BASED_OUTPATIENT_CLINIC_OR_DEPARTMENT_OTHER)
Admission: RE | Admit: 2015-03-19 | Discharge: 2015-03-19 | Disposition: A | Discharge status: Home / Self Care | Payer: Medicare Other | Source: Ambulatory Visit | Attending: General Surgery | Admitting: General Surgery

## 2015-03-19 DIAGNOSIS — I10 Essential (primary) hypertension: Secondary | ICD-10-CM | POA: Diagnosis not present

## 2015-03-19 DIAGNOSIS — Z01812 Encounter for preprocedural laboratory examination: Secondary | ICD-10-CM | POA: Insufficient documentation

## 2015-03-19 DIAGNOSIS — Z79899 Other long term (current) drug therapy: Secondary | ICD-10-CM | POA: Diagnosis not present

## 2015-03-19 DIAGNOSIS — Z955 Presence of coronary angioplasty implant and graft: Secondary | ICD-10-CM | POA: Diagnosis not present

## 2015-03-19 DIAGNOSIS — K4090 Unilateral inguinal hernia, without obstruction or gangrene, not specified as recurrent: Secondary | ICD-10-CM | POA: Insufficient documentation

## 2015-03-19 DIAGNOSIS — Z87891 Personal history of nicotine dependence: Secondary | ICD-10-CM | POA: Diagnosis not present

## 2015-03-19 DIAGNOSIS — F329 Major depressive disorder, single episode, unspecified: Secondary | ICD-10-CM | POA: Diagnosis not present

## 2015-03-19 DIAGNOSIS — K219 Gastro-esophageal reflux disease without esophagitis: Secondary | ICD-10-CM | POA: Diagnosis not present

## 2015-03-19 DIAGNOSIS — E7800 Pure hypercholesterolemia, unspecified: Secondary | ICD-10-CM | POA: Diagnosis not present

## 2015-03-19 DIAGNOSIS — I252 Old myocardial infarction: Secondary | ICD-10-CM | POA: Diagnosis not present

## 2015-03-19 DIAGNOSIS — Z7982 Long term (current) use of aspirin: Secondary | ICD-10-CM | POA: Diagnosis not present

## 2015-03-19 DIAGNOSIS — I2510 Atherosclerotic heart disease of native coronary artery without angina pectoris: Secondary | ICD-10-CM | POA: Diagnosis not present

## 2015-03-19 DIAGNOSIS — G4733 Obstructive sleep apnea (adult) (pediatric): Secondary | ICD-10-CM | POA: Diagnosis not present

## 2015-03-19 LAB — COMPREHENSIVE METABOLIC PANEL
ALT: 24 U/L (ref 17–63)
AST: 24 U/L (ref 15–41)
Albumin: 3.5 g/dL (ref 3.5–5.0)
Alkaline Phosphatase: 60 U/L (ref 38–126)
Anion gap: 9 (ref 5–15)
BILIRUBIN TOTAL: 0.9 mg/dL (ref 0.3–1.2)
BUN: 22 mg/dL — AB (ref 6–20)
CALCIUM: 9.3 mg/dL (ref 8.9–10.3)
CHLORIDE: 103 mmol/L (ref 101–111)
CO2: 27 mmol/L (ref 22–32)
CREATININE: 0.99 mg/dL (ref 0.61–1.24)
Glucose, Bld: 113 mg/dL — ABNORMAL HIGH (ref 65–99)
Potassium: 5.2 mmol/L — ABNORMAL HIGH (ref 3.5–5.1)
Sodium: 139 mmol/L (ref 135–145)
TOTAL PROTEIN: 6.4 g/dL — AB (ref 6.5–8.1)

## 2015-03-19 LAB — CBC WITH DIFFERENTIAL/PLATELET
Basophils Absolute: 0 10*3/uL (ref 0.0–0.1)
Basophils Relative: 1 %
EOS PCT: 2 %
Eosinophils Absolute: 0.2 10*3/uL (ref 0.0–0.7)
HEMATOCRIT: 42.9 % (ref 39.0–52.0)
Hemoglobin: 14.4 g/dL (ref 13.0–17.0)
LYMPHS ABS: 1.3 10*3/uL (ref 0.7–4.0)
LYMPHS PCT: 18 %
MCH: 31.4 pg (ref 26.0–34.0)
MCHC: 33.6 g/dL (ref 30.0–36.0)
MCV: 93.7 fL (ref 78.0–100.0)
MONO ABS: 0.7 10*3/uL (ref 0.1–1.0)
Monocytes Relative: 9 %
NEUTROS ABS: 5.1 10*3/uL (ref 1.7–7.7)
Neutrophils Relative %: 70 %
PLATELETS: 206 10*3/uL (ref 150–400)
RBC: 4.58 MIL/uL (ref 4.22–5.81)
RDW: 13.8 % (ref 11.5–15.5)
WBC: 7.3 10*3/uL (ref 4.0–10.5)

## 2015-03-23 NOTE — H&P (Signed)
Tim Walters  Location: [facility] Patient #: [id number] DOB: 05-Jun-1945 Married / Language: English / Race: White Male   History of Present Illness  .  The patient is a 70 year old male who presents with an inguinal hernia. He returns to discuss his left inguinal hernia and wants to have it repaired. Dr. Hulan Fess is his PCP. In 2015 he underwent excision of an epidermoid cyst from his back and healed uneventfully. He did point out a left inguinal hernia but it was not bothering him so nothing was done. He states that for the past 2 months she's been having daily increasing pain but no history of incarceration. He can push the hernia back in.  Comorbidities include coronary artery disease with myocardial infarction in October 2003. Dr. Tamala Julian placed a stent and he has done very well since that time with no other events. He sees Dr. Tamala Julian yearly. GERD. Hypertension. Depression controlled on Zoloft. Hyperlipidemia. Obstructive sleep apnea by sleep test but doesn't use any devices. He is retired. He was to be sure he can get back to normal activities.  We talked about timing of surgery, and I told him that it was elective, but with progressive pain it would probably be better to do it sooner rather than later. He agrees. We talked about the difference in open technique and laparoscopic technique. We talked about this at length and offered him both the choices. I felt that in his case the open technique might be simpler and with the lowest recurrence rates and that is what he wants to do. I discussed the indications, details, techniques, and numerous risk of left inguinal hernia repair with mesh. He is aware the risks of bleeding, infection, mesh complications, nerve damage and chronic pain, injury to the testicle ,intestine, recurrence of the hernia, and other unforeseen problems. He understands these issues well. All of his questions were answered. He agrees with  this plan.  We will ask Dr. Pernell Dupre for cardiac clearance   Other Problems  Back Pain Depression Gastroesophageal Reflux Disease Hypercholesterolemia Inguinal Hernia Kidney Stone Myocardial infarction Other disease, cancer, significant illness Sleep Apnea  Past Surgical History  Oral Surgery Shoulder Surgery Right. Spinal Surgery - Lower Back  Diagnostic Studies History  Colonoscopy >10 years ago  Allergies  No Known Drug Allergies  Medication History  Aspirin (81MG  Tablet DR, Oral) Active. Atorvastatin Calcium (40MG  Tablet, Oral) Active. Metrogel (0.75% Gel, External) Active. Omeprazole (20MG  Capsule DR, Oral) Active. Zoloft (50MG  Tablet, Oral) Active. Coreg (12.5MG  Tablet, Oral) Active. Altace (2.5MG  Capsule, Oral) Active. Medications Reconciled  Social History  Alcohol use Moderate alcohol use. Caffeine use Coffee. No drug use Tobacco use Never smoker.  Family History  Alcohol Abuse Father. Cancer Mother. Depression Father. Heart Disease Father. Hypertension Father.    Review of Systems General Not Present- Appetite Loss, Chills, Fatigue, Fever, Night Sweats, Weight Gain and Weight Loss. Skin Not Present- Change in Wart/Mole, Dryness, Hives, Jaundice, New Lesions, Non-Healing Wounds, Rash and Ulcer. HEENT Not Present- Earache, Hearing Loss, Hoarseness, Nose Bleed, Oral Ulcers, Ringing in the Ears, Seasonal Allergies, Sinus Pain, Sore Throat, Visual Disturbances, Wears glasses/contact lenses and Yellow Eyes. Respiratory Present- Snoring. Not Present- Bloody sputum, Chronic Cough, Difficulty Breathing and Wheezing. Cardiovascular Not Present- Chest Pain, Difficulty Breathing Lying Down, Leg Cramps, Palpitations, Rapid Heart Rate, Shortness of Breath and Swelling of Extremities. Gastrointestinal Not Present- Abdominal Pain, Bloating, Bloody Stool, Change in Bowel Habits, Chronic diarrhea, Constipation, Difficulty Swallowing,  Excessive gas,  Gets full quickly at meals, Hemorrhoids, Indigestion, Nausea, Rectal Pain and Vomiting. Male Genitourinary Not Present- Blood in Urine, Change in Urinary Stream, Frequency, Impotence, Nocturia, Painful Urination, Urgency and Urine Leakage. Neurological Present- Numbness. Not Present- Decreased Memory, Fainting, Headaches, Seizures, Tingling, Tremor, Trouble walking and Weakness. Psychiatric Present- Depression. Not Present- Anxiety, Bipolar, Change in Sleep Pattern, Fearful and Frequent crying. Hematology Not Present- Easy Bruising, Excessive bleeding, Gland problems, HIV and Persistent Infections.  Vitals   Weight: 192.4 lb Height: 67in Body Surface Area: 1.99 m Body Mass Index: 30.13 kg/m  Temp.: 97.22F  Pulse: 65 (Regular)  BP: 128/80 (Sitting, Left Arm, Standard)     Physical Exam  General Mental Status-Alert. General Appearance-Not in acute distress. Build & Nutrition-Well nourished. Posture-Normal posture. Gait-Normal. Note: BMI 30   Head and Neck Head-normocephalic, atraumatic with no lesions or palpable masses. Trachea-midline. Thyroid Gland Characteristics - normal size and consistency and no palpable nodules.  Chest and Lung Exam Chest and lung exam reveals -on auscultation, normal breath sounds, no adventitious sounds and normal vocal resonance.  Cardiovascular Cardiovascular examination reveals -normal heart sounds, regular rate and rhythm with no murmurs and femoral artery auscultation bilaterally reveals normal pulses, no bruits, no thrills.  Abdomen Inspection Inspection of the abdomen reveals - No Hernias. Palpation/Percussion Palpation and Percussion of the abdomen reveal - Soft, Non Tender, No Rigidity (guarding), No hepatosplenomegaly and No Palpable abdominal masses.  Male Genitourinary Note: Medium size left inguinal hernia. Complete irreducible. No inguinal hernia on the right. Both testes descended.  Borderline atrophic but smooth. No umbilical hernia.   Neurologic Neurologic evaluation reveals -alert and oriented x 3 with no impairment of recent or remote memory, normal attention span and ability to concentrate, normal sensation and normal coordination.  Musculoskeletal Normal Exam - Bilateral-Upper Extremity Strength Normal and Lower Extremity Strength Normal.    Assessment & Plan  LEFT INGUINAL HERNIA (K40.90)   Your left inguinal hernia is a little bit larger, but it has become much more painful. Fortunately, it is completely reducible. I do not feel any hernias elsewhere. We discussed open repair techniques and laparoscopic repair techniques.  We have decided to proceed with open repair of left inguinal hernia with mesh. I discussed the indications, techniques, and numerous risk of this surgery with you. Please read the written information that we gave you.  PRESENCE OF STENT IN CORONARY ARTERY IN PATIENT WITH CORONARY ARTERY DISEASE (I25.10) HISTORY OF MYOCARDIAL INFARCTION (I25.2) Impression: MI October 2003. Catheter with stent by Dr. Tamala Julian. Has done very well since that time with no further problems. Sees Dr. Tamala Julian yearly. CHRONIC GERD (K21.9) DEPRESSION, CONTROLLED (F32.9) Impression: Takes Zoloft daily HYPERTENSION, BENIGN (I10)   Pearce Littlefield M. Dalbert Batman, M.D., [facility] General and Minimally invasive Surgery Breast and Colorectal Surgery Office:   [phone] Pager:   [phone]

## 2015-03-24 ENCOUNTER — Encounter (HOSPITAL_BASED_OUTPATIENT_CLINIC_OR_DEPARTMENT_OTHER)
Admission: RE | Disposition: A | Discharge status: Home / Self Care | Payer: Self-pay | Source: Ambulatory Visit | Attending: General Surgery

## 2015-03-24 ENCOUNTER — Ambulatory Visit (HOSPITAL_BASED_OUTPATIENT_CLINIC_OR_DEPARTMENT_OTHER): Payer: Medicare Other | Admitting: Anesthesiology

## 2015-03-24 ENCOUNTER — Encounter (HOSPITAL_BASED_OUTPATIENT_CLINIC_OR_DEPARTMENT_OTHER): Payer: Self-pay | Admitting: Anesthesiology

## 2015-03-24 ENCOUNTER — Ambulatory Visit (HOSPITAL_BASED_OUTPATIENT_CLINIC_OR_DEPARTMENT_OTHER)
Admission: RE | Admit: 2015-03-24 | Discharge: 2015-03-25 | Disposition: A | Discharge status: Home / Self Care | Payer: Medicare Other | Source: Ambulatory Visit | Attending: General Surgery | Admitting: General Surgery

## 2015-03-24 DIAGNOSIS — Z79899 Other long term (current) drug therapy: Secondary | ICD-10-CM | POA: Insufficient documentation

## 2015-03-24 DIAGNOSIS — I10 Essential (primary) hypertension: Secondary | ICD-10-CM | POA: Insufficient documentation

## 2015-03-24 DIAGNOSIS — I2510 Atherosclerotic heart disease of native coronary artery without angina pectoris: Secondary | ICD-10-CM | POA: Insufficient documentation

## 2015-03-24 DIAGNOSIS — K219 Gastro-esophageal reflux disease without esophagitis: Secondary | ICD-10-CM | POA: Insufficient documentation

## 2015-03-24 DIAGNOSIS — F329 Major depressive disorder, single episode, unspecified: Secondary | ICD-10-CM | POA: Insufficient documentation

## 2015-03-24 DIAGNOSIS — K4090 Unilateral inguinal hernia, without obstruction or gangrene, not specified as recurrent: Secondary | ICD-10-CM

## 2015-03-24 DIAGNOSIS — E7800 Pure hypercholesterolemia, unspecified: Secondary | ICD-10-CM | POA: Insufficient documentation

## 2015-03-24 DIAGNOSIS — I252 Old myocardial infarction: Secondary | ICD-10-CM | POA: Insufficient documentation

## 2015-03-24 DIAGNOSIS — G4733 Obstructive sleep apnea (adult) (pediatric): Secondary | ICD-10-CM | POA: Insufficient documentation

## 2015-03-24 DIAGNOSIS — Z87891 Personal history of nicotine dependence: Secondary | ICD-10-CM | POA: Insufficient documentation

## 2015-03-24 DIAGNOSIS — Z955 Presence of coronary angioplasty implant and graft: Secondary | ICD-10-CM | POA: Insufficient documentation

## 2015-03-24 DIAGNOSIS — Z7982 Long term (current) use of aspirin: Secondary | ICD-10-CM | POA: Insufficient documentation

## 2015-03-24 DIAGNOSIS — Z4889 Encounter for other specified surgical aftercare: Secondary | ICD-10-CM

## 2015-03-24 HISTORY — PX: INGUINAL HERNIA REPAIR: SHX194

## 2015-03-24 HISTORY — PX: INSERTION OF MESH: SHX5868

## 2015-03-24 LAB — POCT I-STAT, CHEM 8
BUN: 34 mg/dL — AB (ref 6–20)
CALCIUM ION: 1.16 mmol/L (ref 1.13–1.30)
CREATININE: 0.8 mg/dL (ref 0.61–1.24)
Chloride: 105 mmol/L (ref 101–111)
GLUCOSE: 124 mg/dL — AB (ref 65–99)
HCT: 45 % (ref 39.0–52.0)
Hemoglobin: 15.3 g/dL (ref 13.0–17.0)
Potassium: 4.1 mmol/L (ref 3.5–5.1)
Sodium: 142 mmol/L (ref 135–145)
TCO2: 27 mmol/L (ref 0–100)

## 2015-03-24 SURGERY — REPAIR, HERNIA, INGUINAL, ADULT
Anesthesia: General | Site: Groin | Laterality: Left

## 2015-03-24 MED ORDER — HYDROCODONE-ACETAMINOPHEN 5-325 MG PO TABS: 1.0000 | ORAL_TABLET | Freq: Four times a day (QID) | ORAL | Status: DC | PRN

## 2015-03-24 MED ORDER — MIDAZOLAM HCL 2 MG/2ML IJ SOLN
1.0000 mg | INTRAMUSCULAR | Status: DC | PRN
  Administered 2015-03-24: 1 mg via INTRAVENOUS

## 2015-03-24 MED ORDER — FENTANYL CITRATE (PF) 100 MCG/2ML IJ SOLN
50.0000 ug | INTRAMUSCULAR | Status: DC | PRN
  Administered 2015-03-24: 100 ug via INTRAVENOUS

## 2015-03-24 MED ORDER — ACETAMINOPHEN 650 MG RE SUPP
650.0000 mg | RECTAL | Status: DC | PRN
Start: 2015-03-24 — End: 2015-03-25

## 2015-03-24 MED ORDER — SODIUM CHLORIDE 0.9 % IV SOLN: 250.0000 mL | INTRAVENOUS | Status: DC | PRN

## 2015-03-24 MED ORDER — OXYCODONE HCL 5 MG PO TABS
5.0000 mg | ORAL_TABLET | ORAL | Status: DC | PRN
Start: 2015-03-24 — End: 2015-03-25
  Administered 2015-03-24 – 2015-03-25 (×4): 10 mg via ORAL
  Filled 2015-03-24 (×4): qty 2

## 2015-03-24 MED ORDER — LIDOCAINE HCL (CARDIAC) 20 MG/ML IV SOLN
INTRAVENOUS | Status: DC | PRN
  Administered 2015-03-24: 50 mg via INTRAVENOUS

## 2015-03-24 MED ORDER — SODIUM CHLORIDE 0.9% FLUSH: 3.0000 mL | INTRAVENOUS | Status: DC | PRN

## 2015-03-24 MED ORDER — OXYCODONE HCL 5 MG PO TABS
ORAL_TABLET | ORAL | Status: AC
  Filled 2015-03-24: qty 1

## 2015-03-24 MED ORDER — DEXAMETHASONE SODIUM PHOSPHATE 10 MG/ML IJ SOLN
INTRAMUSCULAR | Status: AC
  Filled 2015-03-24: qty 1

## 2015-03-24 MED ORDER — FENTANYL CITRATE (PF) 100 MCG/2ML IJ SOLN
INTRAMUSCULAR | Status: DC | PRN
  Administered 2015-03-24 (×2): 25 ug via INTRAVENOUS
  Administered 2015-03-24: 50 ug via INTRAVENOUS

## 2015-03-24 MED ORDER — OXYCODONE HCL 5 MG/5ML PO SOLN: 5.0000 mg | Freq: Once | ORAL | Status: AC | PRN

## 2015-03-24 MED ORDER — SODIUM CHLORIDE 0.9% FLUSH: 3.0000 mL | Freq: Two times a day (BID) | INTRAVENOUS | Status: DC

## 2015-03-24 MED ORDER — CEFAZOLIN SODIUM-DEXTROSE 2-3 GM-% IV SOLR
INTRAVENOUS | Status: AC
  Filled 2015-03-24: qty 50

## 2015-03-24 MED ORDER — BUPIVACAINE HCL (PF) 0.5 % IJ SOLN
INTRAMUSCULAR | Status: DC | PRN
  Administered 2015-03-24: 30 mL via PERINEURAL

## 2015-03-24 MED ORDER — PHENYLEPHRINE 40 MCG/ML (10ML) SYRINGE FOR IV PUSH (FOR BLOOD PRESSURE SUPPORT)
PREFILLED_SYRINGE | INTRAVENOUS | Status: AC
  Filled 2015-03-24: qty 10

## 2015-03-24 MED ORDER — FENTANYL CITRATE (PF) 100 MCG/2ML IJ SOLN
INTRAMUSCULAR | Status: AC
  Filled 2015-03-24: qty 2

## 2015-03-24 MED ORDER — EPHEDRINE SULFATE 50 MG/ML IJ SOLN
INTRAMUSCULAR | Status: DC | PRN
  Administered 2015-03-24: 10 mg via INTRAVENOUS

## 2015-03-24 MED ORDER — ONDANSETRON HCL 4 MG/2ML IJ SOLN
INTRAMUSCULAR | Status: AC
  Filled 2015-03-24: qty 2

## 2015-03-24 MED ORDER — PROMETHAZINE HCL 25 MG/ML IJ SOLN: 6.2500 mg | INTRAMUSCULAR | Status: DC | PRN

## 2015-03-24 MED ORDER — CHLORHEXIDINE GLUCONATE 4 % EX LIQD: 1.0000 "application " | Freq: Once | CUTANEOUS | Status: DC

## 2015-03-24 MED ORDER — GLYCOPYRROLATE 0.2 MG/ML IJ SOLN
INTRAMUSCULAR | Status: AC
  Filled 2015-03-24: qty 1

## 2015-03-24 MED ORDER — HYDROMORPHONE HCL 1 MG/ML IJ SOLN
INTRAMUSCULAR | Status: AC
  Filled 2015-03-24: qty 1

## 2015-03-24 MED ORDER — LACTATED RINGERS IV SOLN
INTRAVENOUS | Status: DC
  Administered 2015-03-24: 10 mL/h via INTRAVENOUS
  Administered 2015-03-24 (×2): via INTRAVENOUS

## 2015-03-24 MED ORDER — EPHEDRINE SULFATE 50 MG/ML IJ SOLN
INTRAMUSCULAR | Status: AC
  Filled 2015-03-24: qty 1

## 2015-03-24 MED ORDER — PROPOFOL 10 MG/ML IV BOLUS
INTRAVENOUS | Status: DC | PRN
  Administered 2015-03-24: 200 mg via INTRAVENOUS

## 2015-03-24 MED ORDER — GLYCOPYRROLATE 0.2 MG/ML IJ SOLN: 0.2000 mg | Freq: Once | INTRAMUSCULAR | Status: DC | PRN

## 2015-03-24 MED ORDER — PHENYLEPHRINE HCL 10 MG/ML IJ SOLN
INTRAMUSCULAR | Status: DC | PRN
  Administered 2015-03-24: 10 ug via INTRAVENOUS

## 2015-03-24 MED ORDER — PROPOFOL 10 MG/ML IV BOLUS
INTRAVENOUS | Status: AC
  Filled 2015-03-24: qty 40

## 2015-03-24 MED ORDER — MIDAZOLAM HCL 2 MG/2ML IJ SOLN
INTRAMUSCULAR | Status: AC
  Filled 2015-03-24: qty 2

## 2015-03-24 MED ORDER — FENTANYL CITRATE (PF) 100 MCG/2ML IJ SOLN: 25.0000 ug | INTRAMUSCULAR | Status: DC | PRN

## 2015-03-24 MED ORDER — OXYCODONE HCL 5 MG PO TABS
5.0000 mg | ORAL_TABLET | Freq: Once | ORAL | Status: AC | PRN
  Administered 2015-03-24: 5 mg via ORAL

## 2015-03-24 MED ORDER — BUPIVACAINE-EPINEPHRINE 0.5% -1:200000 IJ SOLN
INTRAMUSCULAR | Status: DC | PRN
  Administered 2015-03-24: 10 mL

## 2015-03-24 MED ORDER — HYDROMORPHONE HCL 1 MG/ML IJ SOLN
0.2500 mg | INTRAMUSCULAR | Status: DC | PRN
  Administered 2015-03-24 (×2): 0.25 mg via INTRAVENOUS

## 2015-03-24 MED ORDER — KETOROLAC TROMETHAMINE 30 MG/ML IJ SOLN
INTRAMUSCULAR | Status: AC
  Filled 2015-03-24: qty 1

## 2015-03-24 MED ORDER — ACETAMINOPHEN 325 MG PO TABS: 650.0000 mg | ORAL_TABLET | ORAL | Status: DC | PRN

## 2015-03-24 MED ORDER — KETOROLAC TROMETHAMINE 30 MG/ML IJ SOLN
15.0000 mg | Freq: Once | INTRAMUSCULAR | Status: AC | PRN
  Administered 2015-03-24: 15 mg via INTRAVENOUS

## 2015-03-24 MED ORDER — LIDOCAINE HCL (CARDIAC) 20 MG/ML IV SOLN
INTRAVENOUS | Status: AC
  Filled 2015-03-24: qty 5

## 2015-03-24 MED ORDER — GLYCOPYRROLATE 0.2 MG/ML IJ SOLN
INTRAMUSCULAR | Status: DC | PRN
  Administered 2015-03-24 (×2): 0.2 mg via INTRAVENOUS

## 2015-03-24 MED ORDER — DEXAMETHASONE SODIUM PHOSPHATE 4 MG/ML IJ SOLN
INTRAMUSCULAR | Status: DC | PRN
  Administered 2015-03-24: 10 mg via INTRAVENOUS

## 2015-03-24 MED ORDER — SODIUM CHLORIDE 0.9 % IV SOLN
INTRAVENOUS | Status: DC
  Administered 2015-03-24: 15:00:00 via INTRAVENOUS

## 2015-03-24 MED ORDER — CEFAZOLIN SODIUM-DEXTROSE 2-3 GM-% IV SOLR
2.0000 g | INTRAVENOUS | Status: AC
  Administered 2015-03-24: 2 g via INTRAVENOUS

## 2015-03-24 MED ORDER — SCOPOLAMINE 1 MG/3DAYS TD PT72: 1.0000 | MEDICATED_PATCH | Freq: Once | TRANSDERMAL | Status: DC

## 2015-03-24 MED ORDER — MIDAZOLAM HCL 5 MG/5ML IJ SOLN
INTRAMUSCULAR | Status: DC | PRN
  Administered 2015-03-24: 2 mg via INTRAVENOUS

## 2015-03-24 SURGICAL SUPPLY — 62 items
BENZOIN TINCTURE PRP APPL 2/3 (GAUZE/BANDAGES/DRESSINGS) IMPLANT
BINDER ABDOMINAL 10 UNV 27-48 (MISCELLANEOUS) ×3 IMPLANT
BLADE CLIPPER SURG (BLADE) IMPLANT
BLADE HEX COATED 2.75 (ELECTRODE) ×3 IMPLANT
BLADE SURG 10 STRL SS (BLADE) ×3 IMPLANT
CANISTER SUCT 1200ML W/VALVE (MISCELLANEOUS) ×3 IMPLANT
CHLORAPREP W/TINT 26ML (MISCELLANEOUS) ×3 IMPLANT
CLOSURE WOUND 1/2 X4 (GAUZE/BANDAGES/DRESSINGS)
COVER BACK TABLE 60X90IN (DRAPES) ×3 IMPLANT
COVER MAYO STAND STRL (DRAPES) ×3 IMPLANT
DECANTER SPIKE VIAL GLASS SM (MISCELLANEOUS) IMPLANT
DERMABOND ADVANCED (GAUZE/BANDAGES/DRESSINGS) ×2
DERMABOND ADVANCED .7 DNX12 (GAUZE/BANDAGES/DRESSINGS) ×1 IMPLANT
DRAIN PENROSE 1/2X12 LTX STRL (WOUND CARE) ×3 IMPLANT
DRAPE LAPAROTOMY 100X72 PEDS (DRAPES) ×3 IMPLANT
DRAPE LAPAROTOMY TRNSV 102X78 (DRAPE) IMPLANT
DRAPE UTILITY XL STRL (DRAPES) ×3 IMPLANT
ELECT REM PT RETURN 9FT ADLT (ELECTROSURGICAL) ×3
ELECTRODE REM PT RTRN 9FT ADLT (ELECTROSURGICAL) ×1 IMPLANT
GLOVE BIOGEL PI IND STRL 6.5 (GLOVE) ×2 IMPLANT
GLOVE BIOGEL PI INDICATOR 6.5 (GLOVE) ×4
GLOVE ECLIPSE 6.5 STRL STRAW (GLOVE) ×3 IMPLANT
GLOVE EUDERMIC 7 POWDERFREE (GLOVE) ×3 IMPLANT
GOWN STRL REUS W/ TWL LRG LVL3 (GOWN DISPOSABLE) ×1 IMPLANT
GOWN STRL REUS W/ TWL XL LVL3 (GOWN DISPOSABLE) ×1 IMPLANT
GOWN STRL REUS W/TWL LRG LVL3 (GOWN DISPOSABLE) ×2
GOWN STRL REUS W/TWL XL LVL3 (GOWN DISPOSABLE) ×2
MESH ULTRAPRO 3X6 7.6X15CM (Mesh General) ×3 IMPLANT
NEEDLE HYPO 22GX1.5 SAFETY (NEEDLE) ×3 IMPLANT
NEEDLE HYPO 25X1 1.5 SAFETY (NEEDLE) ×3 IMPLANT
NS IRRIG 1000ML POUR BTL (IV SOLUTION) ×3 IMPLANT
PACK BASIN DAY SURGERY FS (CUSTOM PROCEDURE TRAY) ×3 IMPLANT
PENCIL BUTTON HOLSTER BLD 10FT (ELECTRODE) ×3 IMPLANT
SLEEVE SCD COMPRESS KNEE MED (MISCELLANEOUS) IMPLANT
SPONGE GAUZE 4X4 12PLY STER LF (GAUZE/BANDAGES/DRESSINGS) IMPLANT
SPONGE LAP 4X18 X RAY DECT (DISPOSABLE) ×3 IMPLANT
STAPLER VISISTAT 35W (STAPLE) IMPLANT
STRIP CLOSURE SKIN 1/2X4 (GAUZE/BANDAGES/DRESSINGS) IMPLANT
SUT MNCRL AB 4-0 PS2 18 (SUTURE) ×3 IMPLANT
SUT PROLENE 1 CT (SUTURE) ×3 IMPLANT
SUT PROLENE 2 0 CT2 30 (SUTURE) ×9 IMPLANT
SUT SILK 2 0 SH (SUTURE) IMPLANT
SUT SILK 2 0 TIES 17X18 (SUTURE) ×2
SUT SILK 2-0 18XBRD TIE BLK (SUTURE) ×1 IMPLANT
SUT SILK 3 0 SH 30 (SUTURE) IMPLANT
SUT VIC AB 2-0 CT1 27 (SUTURE)
SUT VIC AB 2-0 CT1 TAPERPNT 27 (SUTURE) IMPLANT
SUT VIC AB 2-0 SH 27 (SUTURE) ×4
SUT VIC AB 2-0 SH 27XBRD (SUTURE) ×2 IMPLANT
SUT VIC AB 3-0 54X BRD REEL (SUTURE) ×1 IMPLANT
SUT VIC AB 3-0 BRD 54 (SUTURE) ×2
SUT VIC AB 3-0 FS2 27 (SUTURE) IMPLANT
SUT VIC AB 3-0 SH 27 (SUTURE) ×2
SUT VIC AB 3-0 SH 27X BRD (SUTURE) ×1 IMPLANT
SUT VICRYL 3-0 CR8 SH (SUTURE) IMPLANT
SYR 20CC LL (SYRINGE) ×3 IMPLANT
SYRINGE 10CC LL (SYRINGE) ×3 IMPLANT
TOWEL OR NON WOVEN STRL DISP B (DISPOSABLE) ×3 IMPLANT
TRAY DSU PREP LF (CUSTOM PROCEDURE TRAY) ×3 IMPLANT
TUBE CONNECTING 20'X1/4 (TUBING) ×1
TUBE CONNECTING 20X1/4 (TUBING) ×2 IMPLANT
YANKAUER SUCT BULB TIP NO VENT (SUCTIONS) ×3 IMPLANT

## 2015-03-24 NOTE — Progress Notes (Signed)
Pt developed a mild swelling over the Left lower lateral abdomen several hours postop.  The abdominal wall is not painful or rigid to my exam.  Possibly from nicking of a small blood vessel in the abdominal wall during TAP block. Pt and his wife understand.  They would like to stay overnight for observation and Dr. Dalbert Batman agrees. He and I saw the pt together.   The picture is for comparison.  I will inform the physicians covering at [facility] tomorrow and they will look in on him prior to his discharge.

## 2015-03-24 NOTE — Anesthesia Postprocedure Evaluation (Signed)
Anesthesia Post Note  Patient: Tim Walters  Procedure(s) Performed: Procedure(s) (LRB): OPEN REPAIR LEFT INGUINAL HERNIA  (Left) INSERTION OF MESH (Left)  Patient location during evaluation: PACU Anesthesia Type: General Level of consciousness: awake and alert Pain management: pain level controlled Vital Signs Assessment: post-procedure vital signs reviewed and stable Respiratory status: spontaneous breathing, nonlabored ventilation and respiratory function stable Cardiovascular status: blood pressure returned to baseline and stable Postop Assessment: no signs of nausea or vomiting Anesthetic complications: yes Anesthetic complication details: Mild swelling over Left lower lateral abdomen.  Possibly from TAP block.  Pt desires to be admitted for observation and Dr. Dalbert Batman agrees. and anesthesia complications   Last Vitals:  Filed Vitals:   03/24/15 1230 03/24/15 1300  BP: 133/81 144/77  Pulse: 67 56  Temp:  36.8 C  Resp: 12 14    Last Pain:  Filed Vitals:   03/24/15 1304  PainSc: 4                  [name]

## 2015-03-24 NOTE — Discharge Instructions (Signed)
[facility] _______[facility] ° °UMBILICAL OR INGUINAL HERNIA REPAIR: POST OP INSTRUCTIONS ° °Always review your discharge instruction sheet given to you by the facility where your surgery was performed. °IF YOU HAVE DISABILITY OR FAMILY LEAVE FORMS, YOU MUST BRING THEM TO THE OFFICE FOR PROCESSING.   °DO NOT GIVE THEM TO YOUR DOCTOR. ° °1. A  prescription for pain medication may be given to you upon discharge.  Take your pain medication as prescribed, if needed.  If narcotic pain medicine is not needed, then you may take acetaminophen (Tylenol) or ibuprofen (Advil) as needed. °2. Take your usually prescribed medications unless otherwise directed. °3. If you need a refill on your pain medication, please contact your pharmacy.  They will contact our office to request authorization. Prescriptions will not be filled after 5 pm or on week-ends. °4. You should follow a light diet the first 24 hours after arrival home, such as soup and crackers, etc.  Be sure to include lots of fluids daily.  Resume your normal diet the day after surgery. °5. Most patients will experience some swelling and bruising around the umbilicus or in the groin and scrotum.  Ice packs and reclining will help.  Swelling and bruising can take several days to resolve.  °6. It is common to experience some constipation if taking pain medication after surgery.  Increasing fluid intake and taking a stool softener (such as Colace) will usually help or prevent this problem from occurring.  A mild laxative (Milk of Magnesia or Miralax) should be taken according to package directions if there are no bowel movements after 48 hours. °7. Unless discharge instructions indicate otherwise, you may remove your bandages 24-48 hours after surgery, and you may shower at that time.  You may have steri-strips (small skin tapes) in place directly over the incision.  These strips should be left on the skin for 7-10 days.  If your surgeon used skin glue on the  incision, you may shower in 24 hours.  The glue will flake off over the next 2-3 weeks.  Any sutures or staples will be removed at the office during your follow-up visit. °8. ACTIVITIES:  You may resume regular (light) daily activities beginning the next day--such as daily self-care, walking, climbing stairs--gradually increasing activities as tolerated.  You may have sexual intercourse when it is comfortable.  Refrain from any heavy lifting or straining until approved by your doctor. °a. You may drive when you are no longer taking prescription pain medication, you can comfortably wear a seatbelt, and you can safely maneuver your car and apply brakes. °b. RETURN TO WORK:  __________________________________________________________ °9. You should see your doctor in the office for a follow-up appointment approximately 2-3 weeks after your surgery.  Make sure that you call for this appointment within a day or two after you arrive home to insure a convenient appointment time. °10. OTHER INSTRUCTIONS:  __________________________________________________________________________________________________________________________________________________________________________________________  °WHEN TO CALL YOUR DOCTOR: °1. Fever over 101.0 °2. Inability to urinate °3. Nausea and/or vomiting °4. Extreme swelling or bruising °5. Continued bleeding from incision. °6. Increased pain, redness, or drainage from the incision ° °The clinic staff is available to answer your questions during regular business hours.  Please don’t hesitate to call and ask to speak to one of the nurses for clinical concerns.  If you have a medical emergency, go to the nearest emergency room or call 911.  A surgeon from [facility] is always on call at the hospital ° ° °  [address], [address], [place], [place]  [address] ? ° [address] [address], [place], [place]   [address] °[phone] ? [phone] ? FAX [phone] °Web site:  [email or web address] ° °Post Anesthesia Home Care Instructions ° °Activity: °Get plenty of rest for the remainder of the day. A responsible adult should stay with you for 24 hours following the procedure.  °For the next 24 hours, DO NOT: °-Drive a car °-Operate machinery °-Drink alcoholic beverages °-Take any medication unless instructed by your physician °-Make any legal decisions or sign important papers. ° °Meals: °Start with liquid foods such as gelatin or soup. Progress to regular foods as tolerated. Avoid greasy, spicy, heavy foods. If nausea and/or vomiting occur, drink only clear liquids until the nausea and/or vomiting subsides. Call your physician if vomiting continues. ° °Special Instructions/Symptoms: °Your throat may feel dry or sore from the anesthesia or the breathing tube placed in your throat during surgery. If this causes discomfort, gargle with warm salt water. The discomfort should disappear within 24 hours. ° °If you had a scopolamine patch placed behind your ear for the management of post- operative nausea and/or vomiting: ° °1. The medication in the patch is effective for 72 hours, after which it should be removed.  Wrap patch in a tissue and discard in the trash. Wash hands thoroughly with soap and water. °2. You may remove the patch earlier than 72 hours if you experience unpleasant side effects which may include dry mouth, dizziness or visual disturbances. °3. Avoid touching the patch. Wash your hands with soap and water after contact with the patch. °  ° °

## 2015-03-24 NOTE — Anesthesia Preprocedure Evaluation (Addendum)
Anesthesia Evaluation  Patient identified by MRN, date of birth, ID band Patient awake    Reviewed: Allergy & Precautions, NPO status , Patient's Chart, lab work & pertinent test results  Airway Mallampati: II  TM Distance: >3 FB Neck ROM: Full    Dental no notable dental hx.    Pulmonary neg pulmonary ROS, former smoker,    Pulmonary exam normal breath sounds clear to auscultation       Cardiovascular hypertension, + CAD, + Past MI and + Cardiac Stents  Normal cardiovascular exam Rhythm:Regular Rate:Normal     Neuro/Psych negative neurological ROS  negative psych ROS   GI/Hepatic negative GI ROS, Neg liver ROS,   Endo/Other  negative endocrine ROS  Renal/GU negative Renal ROS  negative genitourinary   Musculoskeletal negative musculoskeletal ROS (+)   Abdominal   Peds negative pediatric ROS (+)  Hematology negative hematology ROS (+)   Anesthesia Other Findings   Reproductive/Obstetrics negative OB ROS                            Anesthesia Physical Anesthesia Plan  ASA: III  Anesthesia Plan: General   Post-op Pain Management: GA combined w/ Regional for post-op pain   Induction: Intravenous  Airway Management Planned: LMA  Additional Equipment:   Intra-op Plan:   Post-operative Plan: Extubation in OR  Informed Consent: I have reviewed the patients History and Physical, chart, labs and discussed the procedure including the risks, benefits and alternatives for the proposed anesthesia with the patient or authorized representative who has indicated his/her understanding and acceptance.   Dental advisory given  Plan Discussed with: CRNA and Surgeon  Anesthesia Plan Comments:         Anesthesia Quick Evaluation

## 2015-03-24 NOTE — Progress Notes (Signed)
Assisted Dr. Rose with left, ultrasound guided, transabdominal plane block. Side rails up, monitors on throughout procedure. See vital signs in flow sheet. Tolerated Procedure well.  

## 2015-03-24 NOTE — Anesthesia Procedure Notes (Addendum)
Anesthesia Regional Block:  TAP block  Pre-Anesthetic Checklist: ,, timeout performed, Correct Patient, Correct Site, Correct Laterality, Correct Procedure, Correct Position, site marked, Risks and benefits discussed,  Surgical consent,  Pre-op evaluation,  At surgeon's request and post-op pain management  Laterality: Left  Prep: chloraprep       Needles:  Injection technique: Single-shot  Needle Type: Echogenic Needle     Needle Length: 9cm 9 cm Needle Gauge: 21 and 21 G    Additional Needles:  Procedures: ultrasound guided (picture in chart) TAP block Narrative:  Injection made incrementally with aspirations every 5 mL.  Performed by: Personally   Additional Notes: Patient tolerated the procedure well without complications   Procedure Name: LMA Insertion Date/Time: 03/24/2015 9:00 AM Performed by: Toula Moos L Pre-anesthesia Checklist: Patient identified, Emergency Drugs available, Suction available, Patient being monitored and Timeout performed Patient Re-evaluated:Patient Re-evaluated prior to inductionOxygen Delivery Method: Circle System Utilized Preoxygenation: Pre-oxygenation with 100% oxygen Intubation Type: IV induction Ventilation: Mask ventilation without difficulty LMA: LMA inserted LMA Size: 5.0 Number of attempts: 1 Airway Equipment and Method: Bite block Placement Confirmation: positive ETCO2 Tube secured with: Tape Dental Injury: Teeth and Oropharynx as per pre-operative assessment

## 2015-03-24 NOTE — Interval H&P Note (Signed)
History and Physical Interval Note:  03/24/2015 8:51 AM  Maggie Schwalbe  has presented today for surgery, with the diagnosis of left inguinal hernia   The various methods of treatment have been discussed with the patient and family. After consideration of risks, benefits and other options for treatment, the patient has consented to  Procedure(s): OPEN REPAIR LEFT INGUINAL HERNIA  (Left) INSERTION OF MESH (Left) as a surgical intervention .  [name] history has been reviewed, patient examined, no change in status, stable for surgery.  I have reviewed the patient's chart and labs.  Questions were answered to the patient's satisfaction.     Adin Hector

## 2015-03-24 NOTE — Transfer of Care (Signed)
Immediate Anesthesia Transfer of Care Note  Patient: Tim Walters  Procedure(s) Performed: Procedure(s): OPEN REPAIR LEFT INGUINAL HERNIA  (Left) INSERTION OF MESH (Left)  Patient Location: PACU  Anesthesia Type:GA combined with regional for post-op pain  Level of Consciousness: sedated  Airway & Oxygen Therapy: Patient Spontanous Breathing and Patient connected to face mask oxygen  Post-op Assessment: Report given to RN and Post -op Vital signs reviewed and stable  Post vital signs: Reviewed and stable  Last Vitals:  Filed Vitals:   03/24/15 0930 03/24/15 0935  BP: 112/61 139/78  Pulse: 51 52  Temp:    Resp: 10 11    Complications: No apparent anesthesia complications

## 2015-03-24 NOTE — Op Note (Signed)
Patient Name:           Tim Walters   Date of Surgery:        03/24/2015  Pre op Diagnosis:      Left inguinal hernia  Post op Diagnosis:    Left inguinal hernia  Procedure:                 Open repair left inguinal hernia with meshKarl Pock repair)  Surgeon:                     Edsel Petrin. Dalbert Batman, M.D., FACS  Assistant:                      OR staff  Operative Indications:   The patient is a 70 year old male who presents with an inguinal hernia. He returns to discuss his left inguinal hernia and wants to have it repaired. Dr. Hulan Fess is his PCP. He states that for the past 2 months he's been having daily increasing pain and bulge in his left inguinal area but no history of incarceration. He can push the hernia back in.      Comorbidities include coronary artery disease with myocardial infarction in October 2003. Dr. Tamala Julian placed a stent and he has done very well since that time with no other events. He sees Dr. Tamala Julian yearly. GERD. Hypertension. Depression controlled on Zoloft. Hyperlipidemia. Obstructive sleep apnea by sleep test but doesn't use any devices. He is retired. He was to be sure he can get back to normal activities.       We talked about timing of surgery, and I told him that it was elective, but with progressive pain it would probably be better to do it sooner rather than later. He agrees. We talked about the difference in open technique and laparoscopic technique. We talked about this at length and offered him both the choices. I felt that in his case the open technique might be simpler and with the lowest recurrence rates and that is what he wants to do.    Operative Findings:        He had an indirect left inguinal hernia and a direct left inguinal hernia.  These were repaired in the usual fashion with onlay ultra Pro mesh..  Procedure in Detail:          The patient received a Tapp block in the holding area by Dr. Kalman Shan.  He was taken to the operating  room and underwent general anesthesia with an LMA device.  The abdomen and left groin and genitalia were prepped and draped in a sterile fashion.  Surgical timeout was performed.  Intravenous antibiotics were given.  0.5% Marcaine with epinephrine was used as local infiltration anesthetic to the skin and subcutaneous tissue.      A transverse incision was made in the left groin overlying the inguinal canal.  Dissection was carried down to the external oblique.  The external oblique was incised in the direction of its fibers, opening of the external inguinal ring.  The external oblique was dissected away from the underlying tissues and self-retaining retractors were placed.  The cord structures were mobilized and encircled with a Penrose drain.  Small sensory nerve that was intimately is associated with cremasteric muscle fibers was traced back to its emergence from the muscles laterally, clamped, divided, and ligated with 2-0 silk tie.  The redundant nerve was resected medially.  Cremasteric muscle fibers were  skeletonized off of the cord and the direct hernia.     I dissected the indirect hernia sac away from the cord structures all the way back to the internal ring, opened the sac and found a small amount of omentum that was easily reduced, and then twisted the indirect sac and suture ligated at the level of the internal ring with a 2-0 Vicryl suture ligature.  The redundant sac was excised and discarded.  The direct hernia sac was also simply mobilized and reduced and oversewn with a running suture of 2-0 Vicryl to hold in place during the repair.  A 3" x 6" piece of ultra Pro mesh was brought to the operative field.  It was trimmed the corners to accommodate the anatomy of the wound and sutured in place with running sutures of 2-0 Prolene and interrupted mattress sutures of 2-0 Prolene.  The mesh was sutured so as to generously overlap the fascia at the pubic tubercle, then along the inguinal ligament  inferiorly.  Medially, superiorly, and superiolaterally several mattress sutures of Prolene were placed to secure the mesh.  The mesh was incised laterally so as to wraparound the cord structures at the internal ring.  The tails of the mesh were overlapped laterally and further sutures were placed laterally.  This provided very secure repair and coverage both medial and lateral to the internal ring, but allowed adequate fingertip opening for the cord structures.  The wound was irrigated with saline.  The external oblique was closed with a running suture of 2-0 Vicryl placing the cord structures deep to the external oblique.  Scarpa's fascia was closed with 3-0 Vicryl sutures and the skin closed with a running subcuticular 4-0 Monocryl and Dermabond.  The patient tolerated the procedure well was taken to PACU in stable condition.  EBL 15 mL.  Counts correct.  Complications none.     Edsel Petrin. Dalbert Batman, M.D., FACS General and Minimally Invasive Surgery Breast and Colorectal Surgery  03/24/2015 11:07 AM

## 2015-03-25 ENCOUNTER — Encounter (HOSPITAL_BASED_OUTPATIENT_CLINIC_OR_DEPARTMENT_OTHER): Payer: Self-pay | Admitting: General Surgery

## 2015-03-25 DIAGNOSIS — K4090 Unilateral inguinal hernia, without obstruction or gangrene, not specified as recurrent: Secondary | ICD-10-CM | POA: Diagnosis not present

## 2015-06-13 ENCOUNTER — Ambulatory Visit (INDEPENDENT_AMBULATORY_CARE_PROVIDER_SITE_OTHER): Payer: Medicare Other | Admitting: Interventional Cardiology

## 2015-06-13 ENCOUNTER — Encounter: Payer: Self-pay | Admitting: Interventional Cardiology

## 2015-06-13 VITALS — BP 128/60 | HR 65 | Ht 67.0 in | Wt 196.8 lb

## 2015-06-13 DIAGNOSIS — I2510 Atherosclerotic heart disease of native coronary artery without angina pectoris: Secondary | ICD-10-CM

## 2015-06-13 DIAGNOSIS — I208 Other forms of angina pectoris: Secondary | ICD-10-CM

## 2015-06-13 DIAGNOSIS — I872 Venous insufficiency (chronic) (peripheral): Secondary | ICD-10-CM

## 2015-06-13 DIAGNOSIS — I10 Essential (primary) hypertension: Secondary | ICD-10-CM | POA: Diagnosis not present

## 2015-06-13 DIAGNOSIS — E785 Hyperlipidemia, unspecified: Secondary | ICD-10-CM | POA: Diagnosis not present

## 2015-06-13 DIAGNOSIS — G4733 Obstructive sleep apnea (adult) (pediatric): Secondary | ICD-10-CM | POA: Diagnosis not present

## 2015-06-13 NOTE — Patient Instructions (Signed)
Medication Instructions:   Your physician recommends that you continue on your current medications as directed. Please refer to the Current Medication list given to you today.   If you need a refill on your cardiac medications before your next appointment, please call your pharmacy.  Labwork:  NONE ORDER TODAY    Testing/Procedures:  Your physician has requested that you have en exercise stress myoview. For further information please visit [email or web address]. Please follow instruction sheet, as given.     Follow-Up: Your physician wants you to follow-up in: [place] will receive a reminder letter in the mail two months in advance. If you don't receive a letter, please call our office to schedule the follow-up appointment.     Any Other Special Instructions Will Be Listed Below (If Applicable).

## 2015-06-13 NOTE — Progress Notes (Signed)
Cardiology Office Note   Date:  06/13/2015   ID:  Tim Walters, DOB [birth year], MRN [id number]  PCP:  Gennette Pac, MD  Cardiologist:  Sinclair Grooms, MD   Chief Complaint  Patient presents with  . Coronary Artery Disease      History of Present Illness: Tim Walters is a 70 y.o. male who presents for Follow-up of coronary artery disease, acute inferior infarction treated with bare-metal stent 2003.  While in the [place] walking with a kayak, he developed pressure in the chest. This happened approximately 3 months ago. Since that time if he does any heavy lifting or walking while carrying a heavy load, he will develop discomfort in the chest. Stationary biking and walking on the treadmill at the gym does not cause a similar discomfort.   Past Medical History  Diagnosis Date  . GERD (gastroesophageal reflux disease)   . Hyperlipidemia   . Hypertension   . Myocardial infarction ([place]) 11/15/2001    Dr. Daneen Schick [facility])  . Depression   . Coronary artery disease   . Wears glasses   . Sleep apnea     had test several yr ago-said he did not need a cpap-still snores  . Arthritis     lt ankle    Past Surgical History  Procedure Laterality Date  . Spine surgery  02/12/1993    L2, L3 fragmented disc  . Vasectomy  1987  . Coronary artery stent  11/15/2001  . Cardiac catheterization  [address]    stent rca  . Shoulder arthroscopy w/ rotator cuff repair  2011    right  . Colonoscopy    . Cyst removal trunk Left 05/08/2013    Procedure: CYST REMOVAL BACK;  Surgeon: Adin Hector, MD;  Location: [place];  Service: General;  Laterality: Left;  . Inguinal hernia repair Left 03/24/2015    Procedure: OPEN REPAIR LEFT INGUINAL HERNIA ;  Surgeon: Fanny Skates, MD;  Location: [place];  Service: General;  Laterality: Left;  . Insertion of mesh Left 03/24/2015    Procedure: INSERTION OF MESH;  Surgeon: Fanny Skates, MD;   Location: [place];  Service: General;  Laterality: Left;     Current Outpatient Prescriptions  Medication Sig Dispense Refill  . aspirin 81 MG tablet Take 1 tablet (81 mg total) by mouth daily.    Marland Kitchen atorvastatin (LIPITOR) 40 MG tablet Take 40 mg by mouth daily.     . carvedilol (COREG) 12.5 MG tablet Take 1 tablet by mouth  twice a day 180 tablet 1  . Multiple Vitamin (MULTIVITAMIN WITH MINERALS) TABS tablet Take 1 tablet by mouth daily.    Marland Kitchen OMEPRAZOLE PO Take 20 mg by mouth every morning.     . ramipril (ALTACE) 2.5 MG capsule Take 1 capsule by mouth  once a day 90 capsule 1  . sertraline (ZOLOFT) 100 MG tablet Take 1/2 tablet daily     No current facility-administered medications for this visit.    Allergies:   Review of patient's allergies indicates no known allergies.    Social History:  The patient  reports that he has quit smoking. He does not have any smokeless tobacco history on file. He reports that he drinks about 0.6 - 1.2 oz of alcohol per week. He reports that he does not use illicit drugs.   Family History:  [name] family history includes COPD in his mother; Healthy in his sister;  Hypertension in his father; Macular degeneration in his mother; Non-Hodgkin's lymphoma in his mother.    ROS:  Please see the history of present illness.   Otherwise, review of systems are positive for Left lower extremity swelling, intermittent. Near syncopal episodes with blanching of the hands bilaterally. Occurred recently after heavy workout..   All other systems are reviewed and negative.    PHYSICAL EXAM: VS:  BP 128/60 mmHg  Pulse 65  Ht 5\' 7"  (1.702 m)  Wt 196 lb 12.8 oz (89.268 kg)  BMI 30.82 kg/m2 , BMI Body mass index is 30.82 kg/(m^2). GEN: Well nourished, well developed, in no acute distress HEENT: normal Neck: no JVD, carotid bruits, or masses Cardiac: RRR.  There is no murmur, rub, or gallop. There is trace left lower extremity edema. There are  bilateral calf to thigh varicosities noted in both legs.Marland Kitchen Respiratory:  clear to auscultation bilaterally, normal work of breathing. GI: soft, nontender, nondistended, + BS MS: no deformity or atrophy Skin: warm and dry, no rash Neuro:  Strength and sensation are intact Psych: euthymic mood, full affect   EKG:  EKG is ordered today. The ekg reveals demonstrates normal sinus rhythm and occasional premature ventricular contraction. Otherwise no change compared to prior.   Recent Labs: 03/19/2015: ALT 24; Platelets 206 03/24/2015: BUN 34*; Creatinine, Ser 0.80; Hemoglobin 15.3; Potassium 4.1; Sodium 142    Lipid Panel No results found for: CHOL, TRIG, HDL, CHOLHDL, VLDL, LDLCALC, LDLDIRECT    Wt Readings from Last 3 Encounters:  06/13/15 196 lb 12.8 oz (89.268 kg)  03/24/15 189 lb (85.73 kg)  06/11/14 197 lb (89.359 kg)      Other studies Reviewed: Additional studies/ records that were reviewed today include: None. The findings include none.    ASSESSMENT AND PLAN:  1. Atherosclerosis of native coronary artery of native heart without angina pectoris Exertional chest discomfort with heavy activity. Rule out progression of coronary disease.  2. Essential hypertension, benign Adequate control  3. Hyperlipidemia On therapy and followed by primary care  4. Obstructive sleep apnea Using C Pap  5. Left greater than the right lower extremity venous insufficiency  Current medicines are reviewed at length with the patient today.  The patient has the following concerns regarding medicines: None.  The following changes/actions have been instituted:    Stress Cardiolite. Hold carvedilol dose on the morning of the test.  Aerobic activity as tolerated  Consider lower extremity moderate tension support stockings for venous insufficiency  Labs/ tests ordered today include:  No orders of the defined types were placed in this encounter.     Disposition:   FU with HS in 1  year  Signed, Sinclair Grooms, MD  06/13/2015 1:59 PM    [facility] [place], [place], [place]  [address] Phone: [phone]; Fax: [phone]

## 2015-06-23 ENCOUNTER — Telehealth (HOSPITAL_COMMUNITY): Payer: Self-pay | Admitting: *Deleted

## 2015-06-23 NOTE — Telephone Encounter (Signed)
Left message on voicemail in reference to upcoming appointment scheduled for 06/26/15. Phone number given for a call back so details instructions can be given. Tim Walters   

## 2015-06-24 ENCOUNTER — Telehealth (HOSPITAL_COMMUNITY): Payer: Self-pay | Admitting: *Deleted

## 2015-06-24 NOTE — Telephone Encounter (Signed)
Patient given detailed instructions per Myocardial Perfusion Study Information Sheet for the test on 06/26/15 at 1000. Patient notified to arrive 15 minutes early and that it is imperative to arrive on time for appointment to keep from having the test rescheduled.  If you need to cancel or reschedule your appointment, please call the office within 24 hours of your appointment. Failure to do so may result in a cancellation of your appointment, and a $50 no show fee. Patient verbalized understanding.Tim Walters

## 2015-06-26 ENCOUNTER — Ambulatory Visit (HOSPITAL_COMMUNITY): Payer: Medicare Other | Attending: Internal Medicine

## 2015-06-26 DIAGNOSIS — R9439 Abnormal result of other cardiovascular function study: Secondary | ICD-10-CM | POA: Diagnosis not present

## 2015-06-26 DIAGNOSIS — R0609 Other forms of dyspnea: Secondary | ICD-10-CM | POA: Diagnosis not present

## 2015-06-26 DIAGNOSIS — I208 Other forms of angina pectoris: Secondary | ICD-10-CM | POA: Insufficient documentation

## 2015-06-26 DIAGNOSIS — I10 Essential (primary) hypertension: Secondary | ICD-10-CM | POA: Diagnosis not present

## 2015-06-26 LAB — MYOCARDIAL PERFUSION IMAGING
CHL RATE OF PERCEIVED EXERTION: 19
CSEPEW: 12.4 METS
CSEPHR: 88 %
CSEPPHR: 131 {beats}/min
Exercise duration (min): 10 min
Exercise duration (sec): 31 s
LHR: 0.31
LVDIAVOL: 87 mL (ref 62–150)
LVSYSVOL: 36 mL
MPHR: 149 {beats}/min
NUC STRESS TID: 0.95
Rest HR: 53 {beats}/min
SDS: 6
SRS: 9
SSS: 15

## 2015-06-26 MED ORDER — TECHNETIUM TC 99M SESTAMIBI GENERIC - CARDIOLITE
32.6000 | Freq: Once | INTRAVENOUS | Status: AC | PRN
  Administered 2015-06-26: 33 via INTRAVENOUS

## 2015-06-26 MED ORDER — TECHNETIUM TC 99M SESTAMIBI GENERIC - CARDIOLITE
10.4000 | Freq: Once | INTRAVENOUS | Status: AC | PRN
  Administered 2015-06-26: 10 via INTRAVENOUS

## 2015-08-04 ENCOUNTER — Other Ambulatory Visit: Payer: Self-pay | Admitting: Interventional Cardiology

## 2016-02-16 HISTORY — PX: CARDIOVASCULAR STRESS TEST: SHX262

## 2016-02-26 ENCOUNTER — Encounter: Payer: Self-pay | Admitting: Interventional Cardiology

## 2016-03-02 LAB — COLOGUARD: Cologuard: NEGATIVE

## 2016-03-30 ENCOUNTER — Encounter: Payer: Self-pay | Admitting: Neurology

## 2016-03-30 ENCOUNTER — Ambulatory Visit (INDEPENDENT_AMBULATORY_CARE_PROVIDER_SITE_OTHER): Payer: Medicare Other | Admitting: Neurology

## 2016-03-30 VITALS — BP 180/91 | HR 62 | Ht 67.0 in | Wt 203.5 lb

## 2016-03-30 DIAGNOSIS — R202 Paresthesia of skin: Secondary | ICD-10-CM | POA: Diagnosis not present

## 2016-03-30 DIAGNOSIS — E538 Deficiency of other specified B group vitamins: Secondary | ICD-10-CM | POA: Diagnosis not present

## 2016-03-30 NOTE — Progress Notes (Signed)
Reason for visit: Numbness of the toes  Referring physician: [name] is a 71 y.o. male  History of present illness:  Tim Walters is a 72 year old right-handed white male with a history of some numbness in the toes of both feet that has developed about 3 years ago. There is an asymmetry to the symptoms, the left foot is worse than the right. The numbness is on the top and the bottom of the toes, mainly on the second, third, and fourth toes of the feet. The patient denies any significant balance changes, he does have some discomfort in the feet at times, but the pain is not severe. He has had a prior surgery in his low back, he believes at the L2-3 level done in 1994. He no longer has back pain or pain down the legs. The symptoms of discomfort in the feet is more when he is inactive, better when he is walking. The patient does not believe there is any weakness of the legs. He denies any neck pain or pain down the arms. Occasionally he may wake up with one hand or the other somewhat numb and it resolves when he shakes the hand out. This is a very occasional event. The patient denies any issues controlling the bowels or the bladder. He is sent for further evaluation. He does report some mild blood sugar elevations, this is diet controlled.  Past Medical History:  Diagnosis Date  . Arthritis    lt ankle  . Coronary artery disease   . Depression   . GERD (gastroesophageal reflux disease)   . Hyperlipidemia   . Hypertension   . Myocardial infarction 11/15/2001   Dr. Daneen Schick [facility])  . Sleep apnea    had test several yr ago-said he did not need a cpap-still snores  . Wears glasses     Past Surgical History:  Procedure Laterality Date  . CARDIAC CATHETERIZATION  23003   stent rca  . COLONOSCOPY    . coronary artery stent  11/15/2001  . CYST REMOVAL TRUNK Left 05/08/2013   Procedure: CYST REMOVAL BACK;  Surgeon: Adin Hector, MD;  Location: [place];  Service: General;  Laterality: Left;  . INGUINAL HERNIA REPAIR Left 03/24/2015   Procedure: OPEN REPAIR LEFT INGUINAL HERNIA ;  Surgeon: Fanny Skates, MD;  Location: [place];  Service: General;  Laterality: Left;  . INSERTION OF MESH Left 03/24/2015   Procedure: INSERTION OF MESH;  Surgeon: Fanny Skates, MD;  Location: [place];  Service: General;  Laterality: Left;  . SHOULDER ARTHROSCOPY W/ ROTATOR CUFF REPAIR  2011   right  . SPINE SURGERY  02/12/1993   L2, L3 fragmented disc  . VASECTOMY  1987    Family History  Problem Relation Age of Onset  . Macular degeneration Mother   . COPD Mother   . Non-Hodgkin's lymphoma Mother   . Hypertension Father   . Healthy Sister     Social history:  reports that he quit smoking about 42 years ago. He has never used smokeless tobacco. He reports that he drinks about 0.6 - 1.2 oz of alcohol per week . He reports that he does not use drugs.  Medications:  Prior to Admission medications   Medication Sig Start Date End Date Taking? Authorizing Provider  aspirin 81 MG tablet Take 1 tablet (81 mg total) by mouth daily. 06/11/14  Yes Belva Crome, MD  atorvastatin (  LIPITOR) 40 MG tablet Take 40 mg by mouth daily.  01/08/13  Yes Historical Provider, MD  carvedilol (COREG) 12.5 MG tablet Take 1 tablet by mouth  twice a day 08/05/15  Yes Belva Crome, MD  Multiple Vitamin (MULTIVITAMIN WITH MINERALS) TABS tablet Take 1 tablet by mouth daily.   Yes Historical Provider, MD  OMEPRAZOLE PO Take 20 mg by mouth every morning.    Yes Historical Provider, MD  ramipril (ALTACE) 2.5 MG capsule Take 1 capsule by mouth  once a day 08/05/15  Yes Belva Crome, MD  sertraline (ZOLOFT) 100 MG tablet Take 1/2 tablet daily 01/08/13  Yes Historical Provider, MD     No Known Allergies  ROS:  Out of a complete 14 system review of symptoms, the patient complains only of the following symptoms, and all other reviewed  systems are negative.  Swelling in ankles Easy bruising Numbness of the feet Depression  Blood pressure (!) 180/91, pulse 62, height 5\' 7"  (1.702 m), weight 203 lb 8 oz (92.3 kg).  Physical Exam  General: The patient is alert and cooperative at the time of the examination. This patient is moderately obese.  Eyes: Pupils are equal, round, and reactive to light. Discs are flat bilaterally.  Neck: The neck is supple, no carotid bruits are noted.  Respiratory: The respiratory examination is clear.  Cardiovascular: The cardiovascular examination reveals a regular rate and rhythm, no obvious murmurs or rubs are noted.  Skin: Extremities are without significant edema.  Neurologic Exam  Mental status: The patient is alert and oriented x 3 at the time of the examination. The patient has apparent normal recent and remote memory, with an apparently normal attention span and concentration ability.  Cranial nerves: Facial symmetry is present. There is good sensation of the face to pinprick and soft touch bilaterally. The strength of the facial muscles and the muscles to head turning and shoulder shrug are normal bilaterally. Speech is well enunciated, no aphasia or dysarthria is noted. Extraocular movements are full. Visual fields are full. The tongue is midline, and the patient has symmetric elevation of the soft palate. No obvious hearing deficits are noted.  Motor: The motor testing reveals 5 over 5 strength of all 4 extremities. Good symmetric motor tone is noted throughout.  Sensory: Sensory testing is intact to pinprick, soft touch, vibration sensation, and position sense on all 4 extremities, with exception there is a slight stocking pattern sensory deficit to pinprick in the distal third of the feet bilaterally. No evidence of extinction is noted.  Coordination: Cerebellar testing reveals good finger-nose-finger and heel-to-shin bilaterally.  Gait and station: Gait is normal. Tandem  gait is normal. Romberg is negative. No drift is seen.  Reflexes: Deep tendon reflexes are symmetric and normal bilaterally. The ankle jerk reflexes are well-maintained bilaterally. Toes are downgoing bilaterally.   Assessment/Plan:  1. Probable early peripheral neuropathy  The patient has symptoms of an early peripheral neuropathy. He does have a stocking pattern sensory deficit in the distal feet bilaterally, he has more discomfort with inactivity. The patient does have some issues with glucose intolerance, this could be the source of the early neuropathy. The patient will be sent for further blood work. It is probably too early to perform nerve conduction study evaluation to pick up the peripheral neuropathy. The patient will follow-up through this office if needed, if he desires to go on medications for discomfort he will call our office.  Jill Alexanders MD 03/30/2016 3:25  PM  [facility] [address] [place], [place] [address]  Phone [phone] Fax [phone]

## 2016-04-02 LAB — B. BURGDORFI ANTIBODIES: Lyme IgG/IgM Ab: <0.91 {ISR} (ref 0.00–0.90)

## 2016-04-02 LAB — MULTIPLE MYELOMA PANEL, SERUM
ALBUMIN SERPL ELPH-MCNC: 3.7 g/dL (ref 2.9–4.4)
Albumin/Glob SerPl: 1.2 (ref 0.7–1.7)
Alpha 1: 0.2 g/dL (ref 0.0–0.4)
Alpha2 Glob SerPl Elph-Mcnc: 0.8 g/dL (ref 0.4–1.0)
B-Globulin SerPl Elph-Mcnc: 1.1 g/dL (ref 0.7–1.3)
Gamma Glob SerPl Elph-Mcnc: 1.1 g/dL (ref 0.4–1.8)
Globulin, Total: 3.2 g/dL (ref 2.2–3.9)
IGA/IMMUNOGLOBULIN A, SERUM: 226 mg/dL (ref 61–437)
IgG (Immunoglobin G), Serum: 968 mg/dL (ref 700–1600)
IgM (Immunoglobulin M), Srm: 147 mg/dL — ABNORMAL HIGH (ref 15–143)
TOTAL PROTEIN: 6.9 g/dL (ref 6.0–8.5)

## 2016-04-02 LAB — VITAMIN B12: VITAMIN B 12: 1091 pg/mL (ref 232–1245)

## 2016-04-02 LAB — RHEUMATOID FACTOR: Rhuematoid fact SerPl-aCnc: <10 IU/mL (ref 0.0–13.9)

## 2016-04-02 LAB — ANGIOTENSIN CONVERTING ENZYME: Angio Convert Enzyme: <15 U/L (ref 14–82)

## 2016-04-02 LAB — ANA W/REFLEX: ANA: NEGATIVE

## 2016-04-02 LAB — SEDIMENTATION RATE: SED RATE: 10 mm/h (ref 0–30)

## 2016-04-04 ENCOUNTER — Telehealth: Payer: Self-pay | Admitting: Neurology

## 2016-04-04 DIAGNOSIS — D472 Monoclonal gammopathy: Secondary | ICD-10-CM

## 2016-04-04 NOTE — Telephone Encounter (Signed)
I called the patient. The blood work shows a monoclonal antibody, IgM. Doubt this is to cause of the chronic sensory symptoms, but may still consider a hematology referral. He is to call if he is OK with the referral.

## 2016-04-06 NOTE — Telephone Encounter (Signed)
Pt called back said he is not clear on the results. He said once the results are released to the web he will discuss with PCP.

## 2016-04-06 NOTE — Addendum Note (Signed)
Addended by: Margette Fast on: 04/06/2016 11:03 AM   Modules accepted: Orders

## 2016-04-06 NOTE — Telephone Encounter (Signed)
I called patient. The patient has a monoclonal antibody with an IgM antibody, Kappa. The patient has had minimal symptoms of numbness in the toes over 3 years, I would doubt that this is related to his neuropathy. Given the monoclonal antibody, I have recommended a hematology evaluation, the patient is amenable to this.

## 2016-04-17 ENCOUNTER — Encounter: Payer: Self-pay | Admitting: Oncology

## 2016-04-17 ENCOUNTER — Telehealth: Payer: Self-pay | Admitting: Oncology

## 2016-04-17 NOTE — Telephone Encounter (Signed)
Appt has been scheduled for the pt to see Dr. Alen Blew on 4/19 at 2pm. Offered a sooner appt, but pt states that he will be going out of town for 3 weeks. Pt agreed to the appt date and time. Aware to arrive 30 minutes early.

## 2016-06-02 ENCOUNTER — Telehealth: Payer: Self-pay | Admitting: *Deleted

## 2016-06-02 NOTE — Telephone Encounter (Signed)
Called patient and left a message reminding him of his new patient appointment with Dr. Alen Blew tomorrow at 2:00 pm. Instructed him to call [phone] if he had any questions or concerns.

## 2016-06-03 ENCOUNTER — Telehealth: Payer: Self-pay | Admitting: Oncology

## 2016-06-03 ENCOUNTER — Ambulatory Visit (HOSPITAL_BASED_OUTPATIENT_CLINIC_OR_DEPARTMENT_OTHER): Payer: Medicare Other | Admitting: Oncology

## 2016-06-03 VITALS — BP 151/67 | HR 62 | Temp 97.6°F | Resp 18 | Ht 67.0 in | Wt 202.9 lb

## 2016-06-03 DIAGNOSIS — G620 Drug-induced polyneuropathy: Secondary | ICD-10-CM

## 2016-06-03 DIAGNOSIS — Z807 Family history of other malignant neoplasms of lymphoid, hematopoietic and related tissues: Secondary | ICD-10-CM

## 2016-06-03 DIAGNOSIS — Z87891 Personal history of nicotine dependence: Secondary | ICD-10-CM | POA: Diagnosis not present

## 2016-06-03 DIAGNOSIS — D472 Monoclonal gammopathy: Secondary | ICD-10-CM

## 2016-06-03 DIAGNOSIS — I10 Essential (primary) hypertension: Secondary | ICD-10-CM

## 2016-06-03 NOTE — Progress Notes (Signed)
Reason for Referral: Monoclonal gammopathy.   HPI: 71 year old gentleman currently of [place] where he lived for the last 30 years. He is a gentleman with history of hypertension, coronary disease and peripheral neuropathy. He was noted to have left lower extremity sensory neuropathy for the last 2-3 years. He was evaluated recently by Dr. Jannifer Franklin and his workup included a serum protein electrophoresis. His serum protein electrophoresis showed no M spike detected but immunofixation did show a monoclonal protein IgM kappa subtype. His IgM level is 147 which is slightly elevated. He reports no other symptoms at this time. He denied any constitutional symptoms or excessive fatigue. He denied any bulky adenopathy or rash. He remains active and attends to activities of daily living. He is no adenopathy is a little limited to his left foot without any other areas affected. He still able to ambulate without any difficulties.   He denies any headaches, blurry vision, syncope or seizures. He does not report any fevers, chills, sweats or weight loss. He does not report any chest pain, palpitation, orthopnea or leg edema. He does not report any cough, wheezing or hemoptysis. He does not report any nausea, vomiting or abdominal pain. He does not report any frequency urgency or hesitancy. He does not report any skeletal complaints. Remaining review of system is unremarkable.   Past Medical History:  Diagnosis Date  . Arthritis    lt ankle  . Coronary artery disease   . Depression   . GERD (gastroesophageal reflux disease)   . Hyperlipidemia   . Hypertension   . Myocardial infarction 11/15/2001   Dr. Daneen Schick [facility])  . Sleep apnea    had test several yr ago-said he did not need a cpap-still snores  . Wears glasses   :  Past Surgical History:  Procedure Laterality Date  . CARDIAC CATHETERIZATION  23003   stent rca  . COLONOSCOPY    . coronary artery stent  11/15/2001  . CYST REMOVAL  TRUNK Left 05/08/2013   Procedure: CYST REMOVAL BACK;  Surgeon: Adin Hector, MD;  Location: [place];  Service: General;  Laterality: Left;  . INGUINAL HERNIA REPAIR Left 03/24/2015   Procedure: OPEN REPAIR LEFT INGUINAL HERNIA ;  Surgeon: Fanny Skates, MD;  Location: [place];  Service: General;  Laterality: Left;  . INSERTION OF MESH Left 03/24/2015   Procedure: INSERTION OF MESH;  Surgeon: Fanny Skates, MD;  Location: [place];  Service: General;  Laterality: Left;  . SHOULDER ARTHROSCOPY W/ ROTATOR CUFF REPAIR  2011   right  . SPINE SURGERY  02/12/1993   L2, L3 fragmented disc  . VASECTOMY  1987  :   Current Outpatient Prescriptions:  .  aspirin 81 MG tablet, Take 1 tablet (81 mg total) by mouth daily., Disp: , Rfl:  .  atorvastatin (LIPITOR) 40 MG tablet, Take 40 mg by mouth daily. , Disp: , Rfl:  .  carvedilol (COREG) 12.5 MG tablet, Take 1 tablet by mouth  twice a day, Disp: 180 tablet, Rfl: 3 .  Multiple Vitamin (MULTIVITAMIN WITH MINERALS) TABS tablet, Take 1 tablet by mouth daily., Disp: , Rfl:  .  OMEPRAZOLE PO, Take 20 mg by mouth every morning. , Disp: , Rfl:  .  ramipril (ALTACE) 2.5 MG capsule, Take 1 capsule by mouth  once a day, Disp: 90 capsule, Rfl: 3 .  sertraline (ZOLOFT) 100 MG tablet, Take 1/2 tablet daily, Disp: , Rfl: :  No Known Allergies:  Family History  Problem Relation Age of Onset  . Macular degeneration Mother   . COPD Mother   . Non-Hodgkin's lymphoma Mother   . Hypertension Father   . Healthy Sister   :  Social History   Social History  . Marital status: Married    Spouse name: Katharine Look  . Number of children: 7  . Years of education: 59   Occupational History  . Retired    Social History Main Topics  . Smoking status: Former Smoker    Quit date: 02/15/1974  . Smokeless tobacco: Never Used  . Alcohol use 0.6 - 1.2 oz/week    1 - 2 Standard drinks or equivalent per week     Comment:  occ  . Drug use: No  . Sexual activity: Not on file   Other Topics Concern  . Not on file   Social History Narrative   Lives with wife, Katharine Look   Caffeine use: Coffee daily   Caffeine free soda  :  Pertinent items are noted in HPI.  Exam: Blood pressure (!) 151/67, pulse 62, temperature 97.6 F (36.4 C), temperature source Oral, resp. rate 18, height '5\' 7"'  (1.702 m), weight 202 lb 14.4 oz (92 kg), SpO2 98 %.  ECOG 0.  General appearance: alert and cooperative without distress. Head: Normocephalic, without obvious abnormality Throat: No oral thrush. Neck: no adenopathy Back: negative Resp: clear to auscultation bilaterally Chest wall: no tenderness Cardio: regular rate and rhythm, S1, S2 normal, no murmur, click, rub or gallop GI: soft, non-tender; bowel sounds normal; no masses,  no organomegaly Extremities: extremities normal, atraumatic, no cyanosis or edema Pulses: 2+ and symmetric Skin: Skin color, texture, turgor normal. No rashes or lesions  CBC    Component Value Date/Time   WBC 7.3 03/19/2015 1210   RBC 4.58 03/19/2015 1210   HGB 15.3 03/24/2015 0926   HCT 45.0 03/24/2015 0926   PLT 206 03/19/2015 1210   MCV 93.7 03/19/2015 1210   MCH 31.4 03/19/2015 1210   MCHC 33.6 03/19/2015 1210   RDW 13.8 03/19/2015 1210   LYMPHSABS 1.3 03/19/2015 1210   MONOABS 0.7 03/19/2015 1210   EOSABS 0.2 03/19/2015 1210   BASOSABS 0.0 03/19/2015 1210     Chemistry      Component Value Date/Time   NA 142 03/24/2015 0926   K 4.1 03/24/2015 0926   CL 105 03/24/2015 0926   CO2 27 03/19/2015 1210   BUN 34 (H) 03/24/2015 0926   CREATININE 0.80 03/24/2015 0926      Component Value Date/Time   CALCIUM 9.3 03/19/2015 1210   ALKPHOS 60 03/19/2015 1210   AST 24 03/19/2015 1210   ALT 24 03/19/2015 1210   BILITOT 0.9 03/19/2015 1210       Assessment and Plan:   71 year old gentleman with the following issues:  1. Monoclonal protein IgM kappa subtype. This was discovered  incidentally as a workup of peripheral neuropathy. His M spike was not detected and these findings were picked up on immunofixation. His quantitative immunoglobulins are all within normal range otherwise. He has no other laboratory abnormalities including a normal CBC and chemistries.  The differential diagnosis was reviewed today with the patient. Reactive monoclonal gammopathy is a likely explanation for this. Other conditions that can cause include autoimmune disorders and liver disease. Plasma cell disorder could also be a possibility in the form of MGUS or lymphoproliferative disorder such as of [place]strm's macroglobulinemia. These are considered less likely however. I see no evidence to  suggest amyloidosis or multiple myeloma.  A management standpoint, I recommended observation and surveillance and repeat protein studies in 12 months.  If he develops progressive cytopenias, rapid increase in his protein studies, or other symptoms complete staging will be needed. I would include a CT scan and a bone marrow biopsy. I doubt these will be needed at any time.  2. Peripheral neuropathy: Likely related to a plasma cell disorder. He continues to follow with neurology regarding this issue.

## 2016-06-03 NOTE — Telephone Encounter (Signed)
Appointments scheduled per 4.19.18 LOS. Patient given AVS report and calendars with future scheduled appointments. °

## 2016-08-23 ENCOUNTER — Other Ambulatory Visit: Payer: Self-pay | Admitting: Interventional Cardiology

## 2016-10-08 ENCOUNTER — Telehealth: Payer: Self-pay | Admitting: Interventional Cardiology

## 2016-10-08 NOTE — Telephone Encounter (Signed)
Patient requesting a call a regards to his medication

## 2016-10-08 NOTE — Telephone Encounter (Signed)
He needs meds refilled until f/u OV in April 2019. Please be sure he has f/u arranged.

## 2016-10-08 NOTE — Telephone Encounter (Signed)
Pt states that he last saw Dr. Tamala Julian in April 2017 and their agreement then was ok to follow up in 2 yrs.  Informed pt that MDs note states one year.  Will forward to Dr. Tamala Julian and his nurse, Anderson Malta, to address next week on when pt should follow up.  He also get Carvedilol and Ramipril filled through Dr. Tamala Julian.  He was given a 30 day supply, since he is "overdue" for follow up. Depending on Dr. Thompson Caul follow up recommendation pt understands that we will send in enough medication to cover him until he sees physician.  Pt would like [place] to send in 90 days supply for the above listed medications.

## 2016-10-11 NOTE — Telephone Encounter (Signed)
lmtcb

## 2016-10-12 MED ORDER — RAMIPRIL 2.5 MG PO CAPS: 2.5000 mg | ORAL_CAPSULE | Freq: Every day | ORAL | 1 refills | Status: DC

## 2016-10-12 MED ORDER — CARVEDILOL 12.5 MG PO TABS
12.5000 mg | ORAL_TABLET | Freq: Two times a day (BID) | ORAL | 1 refills | Status: DC
Start: 2016-10-12 — End: 2017-03-22

## 2016-10-12 NOTE — Telephone Encounter (Signed)
Left message to call back  

## 2016-10-12 NOTE — Telephone Encounter (Signed)
Spoke with pt and made him aware Dr. Tamala Julian said ok to refill meds until pt is due back in April 2019.  Sent to pt's preferred pharmacy.  Pt appreciative for call.

## 2017-02-22 ENCOUNTER — Other Ambulatory Visit: Payer: Self-pay | Admitting: Interventional Cardiology

## 2017-03-21 LAB — BASIC METABOLIC PANEL
BUN: 22 — AB (ref 4–21)
Creatinine: 0.9 (ref 0.6–1.3)
Glucose: 129
Potassium: 4.7 (ref 3.4–5.3)
Sodium: 140 (ref 137–147)

## 2017-03-21 LAB — CBC AND DIFFERENTIAL
HCT: 42 (ref 41–53)
Hemoglobin: 13.7 (ref 13.5–17.5)
Platelets: 173 (ref 150–399)
WBC: 5.9

## 2017-03-21 LAB — LIPID PANEL
Cholesterol: 140 (ref 0–200)
HDL: 91 — AB (ref 35–70)
LDL Cholesterol: 76
Triglycerides: 75 (ref 40–160)

## 2017-03-21 LAB — TSH: TSH: 2.73 (ref 0.41–5.90)

## 2017-03-21 LAB — HEPATIC FUNCTION PANEL
ALT: 24 (ref 10–40)
AST: 17 (ref 14–40)
Alkaline Phosphatase: 56 (ref 25–125)
Bilirubin, Total: 0.6

## 2017-03-21 LAB — PSA: PSA: 0.41

## 2017-03-22 ENCOUNTER — Other Ambulatory Visit: Payer: Self-pay | Admitting: Interventional Cardiology

## 2017-03-22 MED ORDER — RAMIPRIL 2.5 MG PO CAPS: 2.5000 mg | ORAL_CAPSULE | Freq: Every day | ORAL | 0 refills | Status: DC

## 2017-03-22 NOTE — Telephone Encounter (Signed)
Pt's medication was sent to pt's pharmacy as requested. Confirmation received.  °

## 2017-03-23 LAB — HEMOGLOBIN A1C: Hemoglobin A1C: 6.2

## 2017-04-06 ENCOUNTER — Encounter: Payer: Self-pay | Admitting: Interventional Cardiology

## 2017-04-11 ENCOUNTER — Encounter: Payer: Self-pay | Admitting: Physician Assistant

## 2017-04-11 NOTE — Progress Notes (Signed)
Cardiology Office Note    Date:  04/13/2017  ID:  Tim Walters, DOB [birth year], MRN [id number] PCP:  Hulan Fess, MD  Cardiologist:  Dr. Tamala Julian   Chief Complaint: f/u CAD (overdue)  History of Present Illness:  Tim Walters is a 72 y.o. male with history of CAD (anterior MI s/p BMS to RCA 2003 with residual LAD/diagonal disease), HTN, HLD, depression, sleep apnea (not using CPAP), GERD, arthritis, MGUS, pre-diabetes who presents for overdue follow-up. Last cath was in 2003 at time of MI with 80% prox-mid LAD, high-grade obstruction in D2, and 3rd diagonal severely diseased, 80% distal LAD, totally occluded mRCA, normal EF -> s/p overlapping stent to RCA, medical therapy recommended for residual disease. He was last seen by Dr. Tamala Julian in 2017 for some exertional-type chest discomfort. Stress test was low risk with small, moderate intensity, partially reversible inferoseptal defect consistent with prior infarct and very mild peri-infarct ischemia; EF 58 with normal wall motion. Dr. Tamala Julian recommended to continue medical therapy but would consider cath if symptoms persisted. He has history of hyperkalemia with K of 5.2 in 03/2015. More recently he saw his PCP 03/21/17 for routine physical. A1C was 6.2 and LDL was 76 so primary care titrated atorvastatin to 80mg  and counseled patient on risk of progression to diabetes. The patient took this to heart and has since focused on weight loss/diet and has lost 7 lbs. His daughter has a master's degree in nutrition/dietician. He otherwise has been feeling well without any cardiac symptoms. 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 He has chronic trace RLE in ankle related to varicose veins. No syncope, orthopnea, PND.   He does not follow his BP at home. He reports this was 150/80s at his PCP's office. It was 151/67 in 05/2016 heme-onc office. It was 138/88 by initial tech check today, with recheck 150/88  by me. He reports a possible white coat HTN component, but denies ever having any history of low BP. Otherwise recent labs 2/4 showed HDL 49, trig 75, Hgb 13.7, Cr 0.860, K 4.7, ALT 24, TSH wnl.   Past Medical History:  Diagnosis Date  . Arthritis    lt ankle  . Coronary artery disease    a. BMS to RCA 2003 with residual LAD/diag disease treated medically, normal EF.  Marland Kitchen Depression   . GERD (gastroesophageal reflux disease)   . Hyperlipidemia   . Hypertension   . Myocardial infarction ([place]) 11/15/2001   Dr. Daneen Schick [facility])  . Sleep apnea    had test several yr ago-said he did not need a cpap-still snores  . Wears glasses     Past Surgical History:  Procedure Laterality Date  . CARDIAC CATHETERIZATION  23003   stent rca  . COLONOSCOPY    . coronary artery stent  11/15/2001  . CYST REMOVAL TRUNK Left 05/08/2013   Procedure: CYST REMOVAL BACK;  Surgeon: Adin Hector, MD;  Location: [place];  Service: General;  Laterality: Left;  . INGUINAL HERNIA REPAIR Left 03/24/2015   Procedure: OPEN REPAIR LEFT INGUINAL HERNIA ;  Surgeon: Fanny Skates, MD;  Location: [place];  Service: General;  Laterality: Left;  . INSERTION OF MESH Left 03/24/2015   Procedure: INSERTION OF MESH;  Surgeon: Fanny Skates, MD;  Location: [place];  Service: General;  Laterality: Left;  . SHOULDER ARTHROSCOPY W/ ROTATOR CUFF REPAIR  2011  right  . SPINE SURGERY  02/12/1993   L2, L3 fragmented disc  . VASECTOMY  1987    Current Medications: Current Meds  Medication Sig  . aspirin 81 MG tablet Take 1 tablet (81 mg total) by mouth daily.  Marland Kitchen atorvastatin (LIPITOR) 80 MG tablet Take 80 mg by mouth daily.  . carvedilol (COREG) 12.5 MG tablet TAKE 1 TABLET BY MOUTH TWO  TIMES DAILY  . Multiple Vitamin (MULTIVITAMIN WITH MINERALS) TABS tablet Take 1 tablet by mouth daily.  . Multiple Vitamins-Minerals (PRESERVISION AREDS PO) Take 1 capsule by  mouth 2 (two) times daily.  Marland Kitchen OMEPRAZOLE PO Take 20 mg by mouth every morning.   . ramipril (ALTACE) 2.5 MG capsule Take 1 capsule (2.5 mg total) by mouth daily. Please keep upcoming appt before anymore refills. Final Attempt  . sertraline (ZOLOFT) 50 MG tablet Take 50 mg by mouth daily.    Allergies:   Patient has no known allergies.   Social History   Socioeconomic History  . Marital status: Married    Spouse name: Katharine Look  . Number of children: 7  . Years of education: 52  . Highest education level: None  Social Needs  . Financial resource strain: None  . Food insecurity - worry: None  . Food insecurity - inability: None  . Transportation needs - medical: None  . Transportation needs - non-medical: None  Occupational History  . Occupation: Retired  Tobacco Use  . Smoking status: Former Smoker    Last attempt to quit: 02/15/1974    Years since quitting: 43.1  . Smokeless tobacco: Never Used  Substance and Sexual Activity  . Alcohol use: Yes    Alcohol/week: 0.6 - 1.2 oz    Types: 1 - 2 Standard drinks or equivalent per week    Comment: occ  . Drug use: No  . Sexual activity: None  Other Topics Concern  . None  Social History Narrative   Lives with wife, Katharine Look   Caffeine use: Coffee daily   Caffeine free soda     Family History:  Family History  Problem Relation Age of Onset  . Macular degeneration Mother   . COPD Mother   . Non-Hodgkin's lymphoma Mother   . Hypertension Father   . Healthy Sister    ROS:   Please see the history of present illness.  All other systems are reviewed and otherwise negative.    PHYSICAL EXAM:   VS:  BP 138/88   Pulse 60   Ht 5\' 7"  (1.702 m)   Wt 202 lb 1.9 oz (91.7 kg)   BMI 31.66 kg/m   BMI: Body mass index is 31.66 kg/m. GEN: Well nourished, well developed WM, in no acute distress  HEENT: normocephalic, atraumatic Neck: no JVD, carotid bruits, or masses Cardiac: RRR; no murmurs, rubs, or gallops, no edema    Respiratory:  clear to auscultation bilaterally, normal work of breathing GI: soft, nontender, nondistended, + BS MS: no deformity or atrophy  Skin: warm and dry, no rash Neuro:  Alert and Oriented x 3, Strength and sensation are intact, follows commands Psych: euthymic mood, full affect  Wt Readings from Last 3 Encounters:  04/13/17 202 lb 1.9 oz (91.7 kg)  06/03/16 202 lb 14.4 oz (92 kg)  03/30/16 203 lb 8 oz (92.3 kg)      Studies/Labs Reviewed:   EKG:  EKG was ordered today and personally reviewed by me and demonstrates NSR 60bpm with one PAC, TWI III otherwise no  acute changes  Recent Labs: No results found for requested labs within last 8760 hours.   Lipid Panel No results found for: CHOL, TRIG, HDL, CHOLHDL, VLDL, LDLCALC, LDLDIRECT  Additional studies/ records that were reviewed today include: Summarized above.    ASSESSMENT & PLAN:   1. CAD - doing very well on medical therapy. Asymptomatic. Continue current regimen. I am very impressed with the care his primary care provider has given with a proactive approach to addressing his cardiac risk factors. Continue ASA, BB, statin. EKG shows one PAC but he is asymptomatic. 2. Essential HTN - BP consistently elevated. Would like to see this closer to <130/80. Do not think we can titrate ramipril further given baseline K of 4.7 and prior history of hyperkalemia in 2017 with potassium of 5.2. HR is 60 thus will keep carvedilol at present dose. Given his concomitant residual CAD, will add amlodipine 5mg  daily. This was sent in as 30 day fill to local pharmacy. I told him to check his BP at home daily for several days and then send in readings either by phone or mychart for my review. If BP is at goal, will need to send in refill to [facility] to fill. If BP remains suboptimal, would recommend increasing to amlodipine 10mg  daily if tolerating this. If he develops edema with amlodipine, diuretic could be considered instead. He has an  appointment with PCP in May at which time a formal office reading can be reviewed. Will refill ramipril and carvedilol. 3. Hyperlipidemia - PCP recently titrated atorvastatin. Patient reports he has f/u in May of this year for further monitoring. 4. Sleep apnea - he could not previously tolerate CPAP. Discussed CV risks including heart/vascular issues, rhythm problems, BP control problems, and memory loss if left untreated. I asked him to give it some thought and call us if he wishes to revisit an updated sleep study or treatment - would refer to Dr. Radford Pax or Dr. Claiborne Billings.  Disposition: F/u with Dr. Tamala Julian in 1 year.   Medication Adjustments/Labs and Tests Ordered: Current medicines are reviewed at length with the patient today.  Concerns regarding medicines are outlined above. Medication changes, Labs and Tests ordered today are summarized above and listed in the Patient Instructions accessible in Encounters.   Signed, Charlie Pitter, PA-C  04/13/2017 11:47 AM    [address], [place], [place]  [address] Phone: [phone]; Fax: [phone]

## 2017-04-13 ENCOUNTER — Ambulatory Visit: Payer: Medicare Other | Admitting: Interventional Cardiology

## 2017-04-13 ENCOUNTER — Encounter: Payer: Self-pay | Admitting: Physician Assistant

## 2017-04-13 ENCOUNTER — Ambulatory Visit (INDEPENDENT_AMBULATORY_CARE_PROVIDER_SITE_OTHER): Payer: Medicare Other | Admitting: Physician Assistant

## 2017-04-13 VITALS — BP 150/88 | HR 60 | Ht 67.0 in | Wt 202.1 lb

## 2017-04-13 DIAGNOSIS — I10 Essential (primary) hypertension: Secondary | ICD-10-CM

## 2017-04-13 DIAGNOSIS — I2510 Atherosclerotic heart disease of native coronary artery without angina pectoris: Secondary | ICD-10-CM

## 2017-04-13 DIAGNOSIS — G4730 Sleep apnea, unspecified: Secondary | ICD-10-CM | POA: Diagnosis not present

## 2017-04-13 DIAGNOSIS — E785 Hyperlipidemia, unspecified: Secondary | ICD-10-CM

## 2017-04-13 MED ORDER — CARVEDILOL 12.5 MG PO TABS: 12.5000 mg | ORAL_TABLET | Freq: Two times a day (BID) | ORAL | 3 refills | Status: DC

## 2017-04-13 MED ORDER — AMLODIPINE BESYLATE 5 MG PO TABS: 5.0000 mg | ORAL_TABLET | Freq: Every day | ORAL | 1 refills | Status: DC

## 2017-04-13 MED ORDER — RAMIPRIL 2.5 MG PO CAPS: 2.5000 mg | ORAL_CAPSULE | Freq: Every day | ORAL | 3 refills | Status: DC

## 2017-04-13 NOTE — Patient Instructions (Addendum)
Medication Instructions:  Your physician has recommended you make the following change in your medication:  1. START Amlodipine 5 mg once daily  * If you need a refill on your cardiac medications before your next appointment, please call your pharmacy. *  Labwork: None ordered  Testing/Procedures: None ordered  Follow-Up: Your physician wants you to follow-up in: 1 year with Dr. Tamala Julian.  You will receive a reminder letter in the mail two months in advance. If you don't receive a letter, please call our office to schedule the follow-up appointment.  Thank you for choosing [facility]!!    Any Other Special Instructions Will Be Listed Below (If Applicable).  - Check your blood pressure daily for 4-5 days.  Send in the readings via telephone/MyChart so that Dayna may look over them (after starting your new medication Amlodipine).  * Call the office: if you notice your top number (systolic) is greater that 130  and/or bottom number (diastolic) is greater than 80  - If you would like an updated sleep study for your sleep apnea, please let the office know.   Amlodipine tablets What is this medicine? AMLODIPINE (am LOE di peen) is a calcium-channel blocker. It affects the amount of calcium found in your heart and muscle cells. This relaxes your blood vessels, which can reduce the amount of work the heart has to do. This medicine is used to lower high blood pressure. It is also used to prevent chest pain. This medicine may be used for other purposes; ask your health care provider or pharmacist if you have questions. COMMON BRAND NAME(S): Norvasc What should I tell my health care provider before I take this medicine? They need to know if you have any of these conditions: -heart problems like heart failure or aortic stenosis -liver disease -an unusual or allergic reaction to amlodipine, other medicines, foods, dyes, or preservatives -pregnant or trying to get  pregnant -breast-feeding How should I use this medicine? Take this medicine by mouth with a glass of water. Follow the directions on the prescription label. Take your medicine at regular intervals. Do not take more medicine than directed. Talk to your pediatrician regarding the use of this medicine in children. Special care may be needed. This medicine has been used in children as young as 6. Persons over 41 years old may have a stronger reaction to this medicine and need smaller doses. Overdosage: If you think you have taken too much of this medicine contact a poison control center or emergency room at once. NOTE: This medicine is only for you. Do not share this medicine with others. What if I miss a dose? If you miss a dose, take it as soon as you can. If it is almost time for your next dose, take only that dose. Do not take double or extra doses. What may interact with this medicine? -herbal or dietary supplements -local or general anesthetics -medicines for high blood pressure -medicines for prostate problems -rifampin This list may not describe all possible interactions. Give your health care provider a list of all the medicines, herbs, non-prescription drugs, or dietary supplements you use. Also tell them if you smoke, drink alcohol, or use illegal drugs. Some items may interact with your medicine. What should I watch for while using this medicine? Visit your doctor or health care professional for regular check ups. Check your blood pressure and pulse rate regularly. Ask your health care professional what your blood pressure and pulse rate should be, and when you  should contact him or her. This medicine may make you feel confused, dizzy or lightheaded. Do not drive, use machinery, or do anything that needs mental alertness until you know how this medicine affects you. To reduce the risk of dizzy or fainting spells, do not sit or stand up quickly, especially if you are an older patient. Avoid  alcoholic drinks; they can make you more dizzy. Do not suddenly stop taking amlodipine. Ask your doctor or health care professional how you can gradually reduce the dose. What side effects may I notice from receiving this medicine? Side effects that you should report to your doctor or health care professional as soon as possible: -allergic reactions like skin rash, itching or hives, swelling of the face, lips, or tongue -breathing problems -changes in vision or hearing -chest pain -fast, irregular heartbeat -swelling of legs or ankles Side effects that usually do not require medical attention (report to your doctor or health care professional if they continue or are bothersome): -dry mouth -facial flushing -nausea, vomiting -stomach gas, pain -tired, weak -trouble sleeping This list may not describe all possible side effects. Call your doctor for medical advice about side effects. You may report side effects to FDA at 1-800-FDA-1088. Where should I keep my medicine? Keep out of the reach of children. Store at room temperature between 59 and 86 degrees F (15 and 30 degrees C). Protect from light. Keep container tightly closed. Throw away any unused medicine after the expiration date. NOTE: This sheet is a summary. It may not cover all possible information. If you have questions about this medicine, talk to your doctor, pharmacist, or health care provider.  2018 Elsevier/Gold Standard (2011-12-31 11:40:58)

## 2017-04-22 ENCOUNTER — Encounter: Payer: Self-pay | Admitting: Physician Assistant

## 2017-05-03 ENCOUNTER — Other Ambulatory Visit: Payer: Self-pay | Admitting: *Deleted

## 2017-05-03 MED ORDER — AMLODIPINE BESYLATE 5 MG PO TABS: 5.0000 mg | ORAL_TABLET | Freq: Every day | ORAL | 3 refills | Status: DC

## 2017-05-20 ENCOUNTER — Other Ambulatory Visit: Payer: Self-pay | Admitting: Family Medicine

## 2017-05-20 DIAGNOSIS — N6310 Unspecified lump in the right breast, unspecified quadrant: Secondary | ICD-10-CM

## 2017-05-20 DIAGNOSIS — N6320 Unspecified lump in the left breast, unspecified quadrant: Principal | ICD-10-CM

## 2017-05-31 ENCOUNTER — Ambulatory Visit: Payer: Medicare Other

## 2017-05-31 ENCOUNTER — Ambulatory Visit
Admission: RE | Admit: 2017-05-31 | Discharge: 2017-05-31 | Disposition: A | Discharge status: Home / Self Care | Payer: Medicare Other | Source: Ambulatory Visit | Attending: Family Medicine | Admitting: Family Medicine

## 2017-05-31 DIAGNOSIS — N6320 Unspecified lump in the left breast, unspecified quadrant: Principal | ICD-10-CM

## 2017-05-31 DIAGNOSIS — N6310 Unspecified lump in the right breast, unspecified quadrant: Secondary | ICD-10-CM

## 2017-06-02 ENCOUNTER — Ambulatory Visit: Payer: Medicare Other | Admitting: Oncology

## 2017-06-02 ENCOUNTER — Inpatient Hospital Stay: Payer: Medicare Other | Attending: Oncology

## 2017-06-02 ENCOUNTER — Other Ambulatory Visit: Payer: Medicare Other

## 2017-06-02 DIAGNOSIS — D472 Monoclonal gammopathy: Secondary | ICD-10-CM | POA: Insufficient documentation

## 2017-06-02 LAB — COMPREHENSIVE METABOLIC PANEL
ALT: 27 U/L (ref 0–55)
AST: 25 U/L (ref 5–34)
Albumin: 3.8 g/dL (ref 3.5–5.0)
Alkaline Phosphatase: 80 U/L (ref 40–150)
Anion gap: 7 (ref 3–11)
BUN: 22 mg/dL (ref 7–26)
CHLORIDE: 106 mmol/L (ref 98–109)
CO2: 26 mmol/L (ref 22–29)
Calcium: 9.5 mg/dL (ref 8.4–10.4)
Creatinine, Ser: 0.88 mg/dL (ref 0.70–1.30)
GFR calc non Af Amer: >60 mL/min (ref >60)
Glucose, Bld: 112 mg/dL (ref 70–140)
POTASSIUM: 4.3 mmol/L (ref 3.5–5.1)
SODIUM: 139 mmol/L (ref 136–145)
Total Bilirubin: 0.7 mg/dL (ref 0.2–1.2)
Total Protein: 7.1 g/dL (ref 6.4–8.3)

## 2017-06-02 LAB — CBC WITH DIFFERENTIAL/PLATELET
BASOS ABS: 0 10*3/uL (ref 0.0–0.1)
BASOS PCT: 1 %
EOS ABS: 0.2 10*3/uL (ref 0.0–0.5)
EOS PCT: 3 %
HCT: 42.8 % (ref 38.4–49.9)
Hemoglobin: 14.2 g/dL (ref 13.0–17.1)
Lymphocytes Relative: 19 %
Lymphs Abs: 1 10*3/uL (ref 0.9–3.3)
MCH: 31 pg (ref 27.2–33.4)
MCHC: 33.2 g/dL (ref 32.0–36.0)
MCV: 93.6 fL (ref 79.3–98.0)
Monocytes Absolute: 0.6 10*3/uL (ref 0.1–0.9)
Monocytes Relative: 11 %
Neutro Abs: 3.5 10*3/uL (ref 1.5–6.5)
Neutrophils Relative %: 66 %
PLATELETS: 174 10*3/uL (ref 140–400)
RBC: 4.57 MIL/uL (ref 4.20–5.82)
RDW: 14.2 % (ref 11.0–14.6)
WBC: 5.2 10*3/uL (ref 4.0–10.3)

## 2017-06-03 LAB — KAPPA/LAMBDA LIGHT CHAINS
KAPPA, LAMDA LIGHT CHAIN RATIO: 1.63 (ref 0.26–1.65)
Kappa free light chain: 29.5 mg/L — ABNORMAL HIGH (ref 3.3–19.4)
LAMDA FREE LIGHT CHAINS: 18.1 mg/L (ref 5.7–26.3)

## 2017-06-06 ENCOUNTER — Encounter: Payer: Self-pay | Admitting: Oncology

## 2017-06-06 LAB — MULTIPLE MYELOMA PANEL, SERUM
ALBUMIN SERPL ELPH-MCNC: 3.8 g/dL (ref 2.9–4.4)
ALBUMIN/GLOB SERPL: 1.5 (ref 0.7–1.7)
ALPHA 1: 0.2 g/dL (ref 0.0–0.4)
Alpha2 Glob SerPl Elph-Mcnc: 0.7 g/dL (ref 0.4–1.0)
B-GLOBULIN SERPL ELPH-MCNC: 0.9 g/dL (ref 0.7–1.3)
GAMMA GLOB SERPL ELPH-MCNC: 0.9 g/dL (ref 0.4–1.8)
GLOBULIN, TOTAL: 2.7 g/dL (ref 2.2–3.9)
IgA: 230 mg/dL (ref 61–437)
IgG (Immunoglobin G), Serum: 965 mg/dL (ref 700–1600)
IgM (Immunoglobulin M), Srm: 144 mg/dL — ABNORMAL HIGH (ref 15–143)
Total Protein ELP: 6.5 g/dL (ref 6.0–8.5)

## 2017-06-07 ENCOUNTER — Telehealth: Payer: Self-pay | Admitting: *Deleted

## 2017-06-07 NOTE — Telephone Encounter (Signed)
Spoke with patient. Per dr Alen Blew, patient's labs are normal, okay to cancel f/u appt 06/09/17

## 2017-06-08 ENCOUNTER — Telehealth: Payer: Self-pay | Admitting: *Deleted

## 2017-06-08 NOTE — Telephone Encounter (Signed)
Patient calling to request that his labs be faxed to his primary care, dr Lennette Bihari little. faxed

## 2017-06-09 ENCOUNTER — Inpatient Hospital Stay: Payer: Medicare Other | Admitting: Oncology

## 2017-06-10 ENCOUNTER — Telehealth: Payer: Self-pay | Admitting: *Deleted

## 2017-06-10 NOTE — Telephone Encounter (Signed)
That's normal and should not be addressed.

## 2017-06-10 NOTE — Telephone Encounter (Signed)
Patient noticed that his kappa free light chain labs were elevated above normal and wants to know how this should be addressed?

## 2017-06-10 NOTE — Telephone Encounter (Signed)
Tried to call patient twice, Tim Walters

## 2017-06-21 LAB — HEPATIC FUNCTION PANEL: ALT: 23 (ref 10–40)

## 2017-06-21 LAB — LIPID PANEL
Cholesterol: 132 (ref 0–200)
HDL: 58 (ref 35–70)
LDL CALC: 65
Triglycerides: 44 (ref 40–160)

## 2017-06-21 LAB — HEMOGLOBIN A1C: Hemoglobin A1C: 5.8

## 2017-06-21 LAB — BASIC METABOLIC PANEL: Glucose: 117

## 2017-07-07 ENCOUNTER — Encounter: Payer: Self-pay | Admitting: Physician Assistant

## 2017-08-31 ENCOUNTER — Other Ambulatory Visit: Payer: Self-pay | Admitting: Orthopedic Surgery

## 2017-08-31 DIAGNOSIS — M25512 Pain in left shoulder: Secondary | ICD-10-CM

## 2017-09-23 ENCOUNTER — Ambulatory Visit
Admission: RE | Admit: 2017-09-23 | Discharge: 2017-09-23 | Disposition: A | Discharge status: Home / Self Care | Payer: Medicare Other | Source: Ambulatory Visit | Attending: Orthopedic Surgery | Admitting: Orthopedic Surgery

## 2017-09-23 DIAGNOSIS — M25512 Pain in left shoulder: Secondary | ICD-10-CM

## 2018-02-21 ENCOUNTER — Ambulatory Visit: Payer: Medicare Other | Admitting: Nurse Practitioner

## 2018-03-14 ENCOUNTER — Other Ambulatory Visit: Payer: Self-pay | Admitting: Physician Assistant

## 2018-03-28 ENCOUNTER — Ambulatory Visit (INDEPENDENT_AMBULATORY_CARE_PROVIDER_SITE_OTHER): Payer: Medicare Other | Admitting: Nurse Practitioner

## 2018-03-28 ENCOUNTER — Ambulatory Visit: Payer: Medicare Other | Admitting: Nurse Practitioner

## 2018-03-28 ENCOUNTER — Encounter: Payer: Self-pay | Admitting: Nurse Practitioner

## 2018-03-28 VITALS — BP 132/78 | HR 67 | Temp 98.0°F | Resp 10 | Ht 67.0 in | Wt 208.0 lb

## 2018-03-28 DIAGNOSIS — F325 Major depressive disorder, single episode, in full remission: Secondary | ICD-10-CM

## 2018-03-28 DIAGNOSIS — G4733 Obstructive sleep apnea (adult) (pediatric): Secondary | ICD-10-CM

## 2018-03-28 DIAGNOSIS — E785 Hyperlipidemia, unspecified: Secondary | ICD-10-CM

## 2018-03-28 DIAGNOSIS — T148XXA Other injury of unspecified body region, initial encounter: Secondary | ICD-10-CM

## 2018-03-28 DIAGNOSIS — I872 Venous insufficiency (chronic) (peripheral): Secondary | ICD-10-CM

## 2018-03-28 DIAGNOSIS — I10 Essential (primary) hypertension: Secondary | ICD-10-CM | POA: Diagnosis not present

## 2018-03-28 DIAGNOSIS — I2510 Atherosclerotic heart disease of native coronary artery without angina pectoris: Secondary | ICD-10-CM

## 2018-03-28 DIAGNOSIS — R739 Hyperglycemia, unspecified: Secondary | ICD-10-CM

## 2018-03-28 NOTE — Patient Instructions (Signed)
Follow up in 1 month for AWV and Physical  To get fasting blood work prior to appt

## 2018-03-28 NOTE — Progress Notes (Signed)
Careteam: Patient Care Team: Hulan Fess, MD as PCP - General (Family Medicine) Fanny Skates, MD as Consulting Physician (General Surgery) Marchia Bond, MD as Consulting Physician (Orthopedic Surgery) Jarome Matin, MD as Consulting Physician (Dermatology) Belva Crome, MD as Consulting Physician (Cardiology) Dyke Maes, OD as Referring Physician (Optometry) Kathrynn Ducking, MD as Consulting Physician (Neurology)  Advanced Directive information Does Patient Have a Medical Advance Directive?: No, Would patient like information on creating a medical advance directive?: Yes (MAU/Ambulatory/Procedural Areas - Information given)  No Known Allergies  Chief Complaint  Patient presents with  . Establish Care    New patient establish care, patient with elevated blood sugar concerns 155 fasting this am. Patient also with object in left hand x 2 weeks, ? splinter.   . Best Practice Recommendations    Discuss need for Hep C screening     HPI: Patient is a 73 y.o. male seen in the office today to establish care.  Former pt at [employer]. Former provider currently cut hours and plans to retire soon so looking for somewhere new.   Due for AWV and physical.   Hypertension- norvasc 5 mg by mouth daily, coreg 12.5 mg by mouth twice daily and ramipril 2.5 mg by mouth daily following with cardiology   CAD- followed with Cardiology, continues on ASA 81 mg s/p stent in 2003, previously on ASA 325 mg now down to 81 mg   Depression- controlled on zoloft 50 mg by mouth daily.   GERD- controlled on omeprazole.   Hx of macular degeneration- no current evidence of this but using preservision for prevention. Following with ophthalmologist yearly  Hyperlipidemia- currently on lipitor 80 mg daily- reports this is controlled.   Venous insufficieny lower extremity edema noted at time.  Hyperglycemia- has made dietary modifications, checking blood sugars occasionally 147 this  morning fasting. He takes weight off and puts weight on and blood sugar vary.   OSA- can not tolerate CPAP  Torn rotator cuff in left shoulder- not repairable. Hx of cortisone shot. Had been following with orthopedic. Has restricted movement which he is used to.   Had a splinter in left hand that he removed ~2 weeks ago. Feels like there is still some left in there however it completely healed but continues to pick at area Review of Systems:  Review of Systems  Constitutional: Negative for chills, fever and weight loss.  HENT: Negative for tinnitus.   Respiratory: Negative for cough, sputum production and shortness of breath.   Cardiovascular: Negative for chest pain, palpitations and leg swelling.  Gastrointestinal: Negative for abdominal pain, constipation, diarrhea and heartburn.  Genitourinary: Negative for dysuria, frequency and urgency.  Musculoskeletal: Negative for back pain, falls, joint pain and myalgias.  Skin: Negative.   Neurological: Positive for tingling (neuropathy in left foot). Negative for dizziness and headaches.  Endo/Heme/Allergies:       Occasional hot flashes and will feel dizzy occasionally- been ongoing for ~2 years, no changes or worsening  Psychiatric/Behavioral: Negative for depression and memory loss. The patient does not have insomnia.     Past Medical History:  Diagnosis Date  . Arthritis    lt ankle  . Coronary artery disease    a. BMS to RCA 2003 with residual LAD/diag disease treated medically, normal EF.  Marland Kitchen Depression   . GERD (gastroesophageal reflux disease)   . Hyperkalemia    a. K of 5.2 in 2017.  Marland Kitchen Hyperlipidemia   . Hypertension   .  Myocardial infarction ([place]) 11/15/2001   Dr. Daneen Schick [facility] Cardiology)  . OSA (obstructive sleep apnea)    Per [facility] New Patient Packet  . Peripheral neuropathy    Left Foot, Per [place] New Patient Packet  . Pre-diabetes   . Sleep apnea    had test several yr ago-said he did not need a cpap-still snores    . Torn rotator cuff    Per [place] Patient Packet  . Venous insufficiency of left leg    Per [place] Patient Packet  . Wears glasses    Past Surgical History:  Procedure Laterality Date  . CARDIAC CATHETERIZATION  23003   stent rca  . COLONOSCOPY    . coronary artery stent  11/15/2001  . CYST REMOVAL TRUNK Left 05/08/2013   Procedure: CYST REMOVAL BACK;  Surgeon: Adin Hector, MD;  Location: [place];  Service: General;  Laterality: Left;  . INGUINAL HERNIA REPAIR Left 03/24/2015   Procedure: OPEN REPAIR LEFT INGUINAL HERNIA ;  Surgeon: Fanny Skates, MD;  Location: [place];  Service: General;  Laterality: Left;  . INSERTION OF MESH Left 03/24/2015   Procedure: INSERTION OF MESH;  Surgeon: Fanny Skates, MD;  Location: [place];  Service: General;  Laterality: Left;  . SHOULDER ARTHROSCOPY W/ ROTATOR CUFF REPAIR  2011   right  . SPINE SURGERY  02/12/1993   L2, L3 fragmented disc  . VASECTOMY  1987   Social History:   reports that he quit smoking about 44 years ago. He quit after 12.00 years of use. He has never used smokeless tobacco. He reports current alcohol use of about 1.0 - 2.0 standard drinks of alcohol per week. He reports that he does not use drugs.  Family History  Problem Relation Age of Onset  . Macular degeneration Mother   . COPD Mother   . Non-Hodgkin's lymphoma Mother   . Lung cancer Mother        Per [facility] New Patient Packet   . Hypertension Father   . Suicidality Father        Per [facility] New Patient Packet   . High Cholesterol Sister   . Macular degeneration Maternal Grandmother   . Autism Son   . Post-traumatic stress disorder Son   . Diabetes type II Daughter   . Breast cancer Neg Hx     Medications: Patient's Medications  New Prescriptions   No medications on file  Previous Medications   AMLODIPINE (NORVASC) 5 MG TABLET    TAKE 1 TABLET BY MOUTH  DAILY   ASPIRIN 81 MG TABLET    Take 1 tablet (81  mg total) by mouth daily.   ATORVASTATIN (LIPITOR) 80 MG TABLET    Take 80 mg by mouth daily.   CARVEDILOL (COREG) 12.5 MG TABLET    Take 1 tablet (12.5 mg total) by mouth 2 (two) times daily.   MULTIPLE VITAMIN (MULTIVITAMIN WITH MINERALS) TABS TABLET    Take 1 tablet by mouth daily.   MULTIPLE VITAMINS-MINERALS (PRESERVISION AREDS PO)    Take 1 capsule by mouth 2 (two) times daily.   OMEPRAZOLE PO    Take 20 mg by mouth every morning.    RAMIPRIL (ALTACE) 2.5 MG CAPSULE    Take 1 capsule (2.5 mg total) by mouth daily.   SERTRALINE (ZOLOFT) 50 MG TABLET    Take 50 mg by mouth daily.  Modified Medications   No medications on file  Discontinued Medications  No medications on file     Physical Exam:  Vitals:   03/28/18 1331  BP: 132/78  Pulse: 67  Resp: 10  Temp: 98 F (36.7 C)  TempSrc: Oral  SpO2: 94%  Weight: 208 lb (94.3 kg)  Height: 5\' 7"  (1.702 m)   Body mass index is 32.58 kg/m.  Physical Exam Constitutional:      General: He is not in acute distress.    Appearance: He is well-developed. He is not diaphoretic.  HENT:     Head: Normocephalic and atraumatic.     Mouth/Throat:     Pharynx: No oropharyngeal exudate.  Eyes:     Conjunctiva/sclera: Conjunctivae normal.     Pupils: Pupils are equal, round, and reactive to light.  Neck:     Musculoskeletal: Normal range of motion and neck supple.  Cardiovascular:     Rate and Rhythm: Normal rate and regular rhythm.     Heart sounds: Normal heart sounds.  Pulmonary:     Effort: Pulmonary effort is normal.     Breath sounds: Normal breath sounds.  Abdominal:     General: Bowel sounds are normal.     Palpations: Abdomen is soft.  Musculoskeletal:        General: No swelling or tenderness.     Comments: Tenderness noted at base of left hand middle finger over MCP joint, open area examined and without foreign body noted. No redness, warmth or swelling noted.   Skin:    General: Skin is warm and dry.  Neurological:       Mental Status: He is alert and oriented to person, place, and time.     Labs reviewed: Basic Metabolic Panel: Recent Labs    06/02/17 0903  NA 139  K 4.3  CL 106  CO2 26  GLUCOSE 112  BUN 22  CREATININE 0.88  CALCIUM 9.5   Liver Function Tests: Recent Labs    06/02/17 0903  AST 25  ALT 27  ALKPHOS 80  BILITOT 0.7  PROT 7.1  ALBUMIN 3.8   No results for input(s): LIPASE, AMYLASE in the last 8760 hours. No results for input(s): AMMONIA in the last 8760 hours. CBC: Recent Labs    06/02/17 0903  WBC 5.2  NEUTROABS 3.5  HGB 14.2  HCT 42.8  MCV 93.6  PLT 174   Lipid Panel: No results for input(s): CHOL, HDL, LDLCALC, TRIG, CHOLHDL, LDLDIRECT in the last 8760 hours. TSH: No results for input(s): TSH in the last 8760 hours. A1C: No results found for: HGBA1C   Assessment/Plan 1. Hyperlipidemia, unspecified hyperlipidemia type -continues on Lipitor with dietary modifications.  - Lipid Panel; Future - COMPLETE METABOLIC PANEL WITH GFR; Future  2. Hyperglycemia -diet controlled. Will follow up A1c - Hemoglobin A1c; Future  3. Essential hypertension, benign Stable on coreg, ramipril and novasc  4. Venous insufficiency of left leg Stable at this time. novasc may be contributing to swelling  5. Atherosclerosis of native coronary artery of native heart without angina pectoris Stable, without chest pains, followed by cardiology, continues on ramipril and coreg  - CBC with Differential/Platelets; Future  6. Obstructive sleep apnea Unable to tolerate CPAP  7. Major depressive disorder in full remission, unspecified whether recurrent ([place]) Well controlled on zoloft 50 mg daily   8. Splinter in skin Appears that he had removed splinter. No signs of foreign body left. Encouraged pt to let area heal and monitor without disrupting area to see if tenderness improves. To notify if  redness, swelling or increase in pain.  Next appt: 1 month for AWV and EV, with  labs prior  Isabele Lollar K. [place], [facility] [phone]

## 2018-03-31 ENCOUNTER — Other Ambulatory Visit: Payer: Self-pay | Admitting: *Deleted

## 2018-03-31 MED ORDER — ATORVASTATIN CALCIUM 80 MG PO TABS: 80.0000 mg | ORAL_TABLET | Freq: Every day | ORAL | 1 refills | Status: DC

## 2018-03-31 NOTE — Telephone Encounter (Signed)
Patient requested refill to be sent to [facility].  

## 2018-04-03 ENCOUNTER — Other Ambulatory Visit: Payer: Self-pay | Admitting: Nurse Practitioner

## 2018-04-03 ENCOUNTER — Ambulatory Visit
Admission: RE | Admit: 2018-04-03 | Discharge: 2018-04-03 | Disposition: A | Discharge status: Home / Self Care | Payer: Medicare Other | Source: Ambulatory Visit | Attending: Nurse Practitioner | Admitting: Nurse Practitioner

## 2018-04-03 DIAGNOSIS — T148XXA Other injury of unspecified body region, initial encounter: Secondary | ICD-10-CM

## 2018-04-05 ENCOUNTER — Telehealth: Payer: Self-pay

## 2018-04-05 ENCOUNTER — Ambulatory Visit (INDEPENDENT_AMBULATORY_CARE_PROVIDER_SITE_OTHER): Payer: Medicare Other | Admitting: Nurse Practitioner

## 2018-04-05 ENCOUNTER — Other Ambulatory Visit: Payer: Medicare Other

## 2018-04-05 ENCOUNTER — Encounter: Payer: Self-pay | Admitting: Nurse Practitioner

## 2018-04-05 VITALS — BP 142/90 | HR 59 | Ht 67.0 in | Wt 207.0 lb

## 2018-04-05 DIAGNOSIS — M79642 Pain in left hand: Secondary | ICD-10-CM

## 2018-04-05 MED ORDER — TETANUS-DIPHTHERIA TOXOIDS TD 2-2 LF/0.5ML IM SUSP: 0.5000 mL | Freq: Once | INTRAMUSCULAR | 0 refills | Status: DC

## 2018-04-05 MED ORDER — DOXYCYCLINE HYCLATE 100 MG PO TABS: 100.0000 mg | ORAL_TABLET | Freq: Two times a day (BID) | ORAL | 0 refills | Status: DC

## 2018-04-05 NOTE — Telephone Encounter (Signed)
-----   Message from Lauree Chandler, NP sent at 04/05/2018 11:12 AM EST ----- Please notify pt and let him know we called in doxycyline 100 mg by mouth twice daily for hand, concern of infection. To have him take this with food and probiotic twice daily to avoid GI upset. Take all medication that is prescribed.

## 2018-04-05 NOTE — Telephone Encounter (Signed)
According to patient last Tetanus was 8 years ago. Janett Billow I am confirming, you said if not done within last 5 years to send rx to pharmacy?

## 2018-04-05 NOTE — Progress Notes (Signed)
Careteam: Patient Care Team: Lauree Chandler, NP as PCP - General (Geriatric Medicine) Fanny Skates, MD as Consulting Physician (General Surgery) Marchia Bond, MD as Consulting Physician (Orthopedic Surgery) Jarome Matin, MD as Consulting Physician (Dermatology) Belva Crome, MD as Consulting Physician (Cardiology) Dyke Maes, OD as Referring Physician (Optometry) Kathrynn Ducking, MD as Consulting Physician (Neurology)  Advanced Directive information    No Known Allergies  Chief Complaint  Patient presents with  . Follow-up    Discuss hand Xray results      HPI: Patient is a 73 y.o. male seen in the office today due to hand pain. Thought it was supposed to come in. Took some ibuprofen which was benefit.   Xray 04/03/2018 impression reviewed with pt No evidence of radiopaque foreign body in the hand. Osteoarthritis, greatest at the 2nd MCP and 1st CMC joints.  Continues to have some redness and swelling with tenderness. Area where he had splinter has healed.   Review of Systems:  Review of Systems  Musculoskeletal: Positive for myalgias.       Hand pain.    Past Medical History:  Diagnosis Date  . Arthritis    lt ankle  . Coronary artery disease    a. BMS to RCA 2003 with residual LAD/diag disease treated medically, normal EF.  Marland Kitchen Depression   . GERD (gastroesophageal reflux disease)   . Hyperkalemia    a. K of 5.2 in 2017.  Marland Kitchen Hyperlipidemia   . Hypertension   . Myocardial infarction ([place]) 11/15/2001   Dr. Daneen Schick [facility] Cardiology)  . OSA (obstructive sleep apnea)    Per [facility] New Patient Packet  . Peripheral neuropathy    Left Foot, Per [place] New Patient Packet  . Pre-diabetes   . Sleep apnea    had test several yr ago-said he did not need a cpap-still snores  . Torn rotator cuff    Per [place] Patient Packet  . Venous insufficiency of left leg    Per [place] Patient Packet  . Wears glasses    Past Surgical History:  Procedure  Laterality Date  . CARDIAC CATHETERIZATION  23003   stent rca  . COLONOSCOPY    . coronary artery stent  11/15/2001  . CYST REMOVAL TRUNK Left 05/08/2013   Procedure: CYST REMOVAL BACK;  Surgeon: Adin Hector, MD;  Location: [place];  Service: General;  Laterality: Left;  . INGUINAL HERNIA REPAIR Left 03/24/2015   Procedure: OPEN REPAIR LEFT INGUINAL HERNIA ;  Surgeon: Fanny Skates, MD;  Location: [place];  Service: General;  Laterality: Left;  . INSERTION OF MESH Left 03/24/2015   Procedure: INSERTION OF MESH;  Surgeon: Fanny Skates, MD;  Location: [place];  Service: General;  Laterality: Left;  . SHOULDER ARTHROSCOPY W/ ROTATOR CUFF REPAIR  2011   right  . SPINE SURGERY  02/12/1993   L2, L3 fragmented disc  . VASECTOMY  1987   Social History:   reports that he quit smoking about 44 years ago. His smoking use included cigarettes. He quit after 12.00 years of use. He has never used smokeless tobacco. He reports current alcohol use of about 14.0 standard drinks of alcohol per week. He reports that he does not use drugs.  Family History  Problem Relation Age of Onset  . Macular degeneration Mother   . COPD Mother   . Non-Hodgkin's lymphoma Mother   . Lung cancer Mother  Per [facility] New Patient Packet   . Hypertension Father   . Suicidality Father        Per [facility] New Patient Packet   . Depression Father   . High Cholesterol Sister   . Macular degeneration Maternal Grandmother   . Autism Son   . Post-traumatic stress disorder Son   . Diabetes type II Daughter   . Breast cancer Neg Hx     Medications: Patient's Medications  New Prescriptions   No medications on file  Previous Medications   AMLODIPINE (NORVASC) 5 MG TABLET    TAKE 1 TABLET BY MOUTH  DAILY   ASPIRIN 81 MG TABLET    Take 1 tablet (81 mg total) by mouth daily.   ATORVASTATIN (LIPITOR) 80 MG TABLET    Take 1 tablet (80 mg total) by mouth daily.    CARVEDILOL (COREG) 12.5 MG TABLET    Take 1 tablet (12.5 mg total) by mouth 2 (two) times daily.   MULTIPLE VITAMIN (MULTIVITAMIN WITH MINERALS) TABS TABLET    Take 1 tablet by mouth daily.   MULTIPLE VITAMINS-MINERALS (PRESERVISION AREDS PO)    Take 1 capsule by mouth 2 (two) times daily.   OMEPRAZOLE PO    Take 20 mg by mouth every morning.    RAMIPRIL (ALTACE) 2.5 MG CAPSULE    Take 1 capsule (2.5 mg total) by mouth daily.   SERTRALINE (ZOLOFT) 50 MG TABLET    Take 50 mg by mouth daily.  Modified Medications   No medications on file  Discontinued Medications   No medications on file     Physical Exam:  Vitals:   04/05/18 0903  BP: (!) 142/90  Pulse: (!) 59  SpO2: 92%  Weight: 207 lb (93.9 kg)  Height: 5\' 7"  (1.702 m)   Body mass index is 32.42 kg/m.  Physical Exam Constitutional:      General: He is not in acute distress.    Appearance: He is well-developed. He is not diaphoretic.  HENT:     Head: Normocephalic and atraumatic.     Mouth/Throat:     Pharynx: No oropharyngeal exudate.  Eyes:     Conjunctiva/sclera: Conjunctivae normal.     Pupils: Pupils are equal, round, and reactive to light.  Cardiovascular:     Heart sounds: Normal heart sounds.  Abdominal:     General: Bowel sounds are normal.     Palpations: Abdomen is soft.  Musculoskeletal:        General: No swelling.     Left hand: He exhibits tenderness. He exhibits normal range of motion. Normal sensation noted. Normal strength noted.       Hands:     Comments: Tenderness noted at the 3rd and 4th MCP joints, open area healed. Continues to have redness and swelling.    Skin:    General: Skin is warm and dry.  Neurological:     Mental Status: He is alert and oriented to person, place, and time.     Labs reviewed: Basic Metabolic Panel: Recent Labs    06/02/17 0903  NA 139  K 4.3  CL 106  CO2 26  GLUCOSE 112  BUN 22  CREATININE 0.88  CALCIUM 9.5   Liver Function Tests: Recent Labs     06/02/17 0903  AST 25  ALT 27  ALKPHOS 80  BILITOT 0.7  PROT 7.1  ALBUMIN 3.8   No results for input(s): LIPASE, AMYLASE in the last 8760 hours. No results for input(s): AMMONIA  in the last 8760 hours. CBC: Recent Labs    06/02/17 0903  WBC 5.2  NEUTROABS 3.5  HGB 14.2  HCT 42.8  MCV 93.6  PLT 174   Lipid Panel: No results for input(s): CHOL, HDL, LDLCALC, TRIG, CHOLHDL, LDLDIRECT in the last 8760 hours. TSH: No results for input(s): TSH in the last 8760 hours. A1C: No results found for: HGBA1C   Assessment/Plan 1. Left hand pain -to use aleve by mouth twice daily x 5 days  -to use ice and muscle rub -concern for cellulitis due to swelling, redness and pain even though initial open area has healed.  - doxycycline (VIBRA-TABS) 100 MG tablet; Take 1 tablet (100 mg total) by mouth 2 (two) times daily.  Dispense: 14 tablet; Refill: 0 -unsure if he is UTD on Td but feels like he has had in the past 5 years. Will notify office if not.   Return precautions discussed.  Carlos American. [place], [facility] [phone]

## 2018-04-05 NOTE — Telephone Encounter (Signed)
Also, Per Janett Billow when was last TDaP, if patient has not had one within the last 5 years rx will need to be sent to local pharmacy.      Spoke with patient, patient aware rx sent to pharmacy for antibiotic (treatment for cellulitis per Janett Billow). Patient states [facility] on [address] and [address] is closer and requested that I send to that location instead. RX sent as requested.  Patient thinks he had TDaP within last 5 years. Patient will find out and let me know by return call or mychart message.

## 2018-04-05 NOTE — Patient Instructions (Signed)
To use aleve 220 mg by mouth twice daily with food for 5 days To ice hand 2 times daily ~20 mins.  To use aspercream/bengay/aspercream/biofreeze 2-3 times daily (after ice)   Notify if symptoms worsen or fail to improve we can place a referral to orthopedic (hand specialist)

## 2018-04-06 ENCOUNTER — Other Ambulatory Visit: Payer: Medicare Other

## 2018-04-06 ENCOUNTER — Telehealth: Payer: Self-pay

## 2018-04-06 LAB — CBC WITH DIFFERENTIAL/PLATELET
Absolute Monocytes: 661 cells/uL (ref 200–950)
Basophils Absolute: 52 cells/uL (ref 0–200)
Basophils Relative: 0.9 %
Eosinophils Absolute: 313 cells/uL (ref 15–500)
Eosinophils Relative: 5.4 %
HCT: 41.5 % (ref 38.5–50.0)
Hemoglobin: 14.1 g/dL (ref 13.2–17.1)
Lymphs Abs: 1137 cells/uL (ref 850–3900)
MCH: 31.5 pg (ref 27.0–33.0)
MCHC: 34 g/dL (ref 32.0–36.0)
MCV: 92.8 fL (ref 80.0–100.0)
MPV: 10.7 fL (ref 7.5–12.5)
Monocytes Relative: 11.4 %
Neutro Abs: 3637 cells/uL (ref 1500–7800)
Neutrophils Relative %: 62.7 %
Platelets: 174 10*3/uL (ref 140–400)
RBC: 4.47 10*6/uL (ref 4.20–5.80)
RDW: 12.8 % (ref 11.0–15.0)
Total Lymphocyte: 19.6 %
WBC: 5.8 10*3/uL (ref 3.8–10.8)

## 2018-04-06 LAB — COMPLETE METABOLIC PANEL WITH GFR
AG Ratio: 1.7 (calc) (ref 1.0–2.5)
ALT: 28 U/L (ref 9–46)
AST: 22 U/L (ref 10–35)
Albumin: 4 g/dL (ref 3.6–5.1)
Alkaline phosphatase (APISO): 70 U/L (ref 35–144)
BUN/Creatinine Ratio: 33 (calc) — ABNORMAL HIGH (ref 6–22)
BUN: 31 mg/dL — ABNORMAL HIGH (ref 7–25)
CO2: 26 mmol/L (ref 20–32)
Calcium: 9 mg/dL (ref 8.6–10.3)
Chloride: 105 mmol/L (ref 98–110)
Creat: 0.95 mg/dL (ref 0.70–1.18)
GFR, Est African American: 91 mL/min/{1.73_m2} (ref >=60)
GFR, Est Non African American: 79 mL/min/{1.73_m2} (ref >=60)
Globulin: 2.3 g/dL (calc) (ref 1.9–3.7)
Glucose, Bld: 127 mg/dL — ABNORMAL HIGH (ref 65–99)
Potassium: 4.3 mmol/L (ref 3.5–5.3)
Sodium: 137 mmol/L (ref 135–146)
Total Bilirubin: 0.5 mg/dL (ref 0.2–1.2)
Total Protein: 6.3 g/dL (ref 6.1–8.1)

## 2018-04-06 LAB — LIPID PANEL
Cholesterol: 130 mg/dL (ref <200)
HDL: 55 mg/dL (ref >=40)
LDL CHOLESTEROL (CALC): 62 mg/dL
Non-HDL Cholesterol (Calc): 75 mg/dL (calc) (ref <130)
TRIGLYCERIDES: 54 mg/dL (ref <150)
Total CHOL/HDL Ratio: 2.4 (calc) (ref <5.0)

## 2018-04-06 LAB — HEMOGLOBIN A1C
Hgb A1c MFr Bld: 5.9 % of total Hgb — ABNORMAL HIGH (ref <5.7)
Mean Plasma Glucose: 123 (calc)
eAG (mmol/L): 6.8 (calc)

## 2018-04-06 MED ORDER — TETANUS-DIPHTHERIA TOXOIDS TD 2-2 LF/0.5ML IM SUSP: 0.5000 mL | Freq: Once | INTRAMUSCULAR | 0 refills | Status: AC

## 2018-04-06 NOTE — Telephone Encounter (Signed)
Call was placed to patient to discuss his lab results. Patient also expressed interest in getting Tetanus vaccine which he is due.I will send over a RX to his [place]

## 2018-04-06 NOTE — Telephone Encounter (Signed)
-----   Message from Lauree Chandler, NP sent at 04/06/2018  7:16 AM EST ----- Blood counts look good, blood sugar slightly elevated, A1c at 5.9, doing good! continue dietary modifications to keep this controlled. Cholesterol is at goal. Electrolytes, kidney, and  liver function look good.

## 2018-04-07 ENCOUNTER — Encounter: Payer: Self-pay | Admitting: Nurse Practitioner

## 2018-04-10 ENCOUNTER — Encounter: Payer: Self-pay | Admitting: Nurse Practitioner

## 2018-04-11 ENCOUNTER — Encounter: Payer: Self-pay | Admitting: Nurse Practitioner

## 2018-04-11 ENCOUNTER — Ambulatory Visit (INDEPENDENT_AMBULATORY_CARE_PROVIDER_SITE_OTHER): Payer: Medicare Other | Admitting: Nurse Practitioner

## 2018-04-11 VITALS — BP 122/78 | HR 61 | Temp 97.4°F | Resp 10 | Ht 67.0 in | Wt 206.6 lb

## 2018-04-11 DIAGNOSIS — R600 Localized edema: Secondary | ICD-10-CM | POA: Diagnosis not present

## 2018-04-11 DIAGNOSIS — M79642 Pain in left hand: Secondary | ICD-10-CM | POA: Diagnosis not present

## 2018-04-11 NOTE — Patient Instructions (Signed)
To try to "rest" hand Avoid over use Cont aleve twice daily for 5 additional days.  To monitor- notify for increase of the redness, swelling, pain, fevers, malaise

## 2018-04-11 NOTE — Progress Notes (Signed)
Careteam: Patient Care Team: Lauree Chandler, NP as PCP - General (Geriatric Medicine) Fanny Skates, MD as Consulting Physician (General Surgery) Marchia Bond, MD as Consulting Physician (Orthopedic Surgery) Jarome Matin, MD as Consulting Physician (Dermatology) Belva Crome, MD as Consulting Physician (Cardiology) Dyke Maes, OD as Referring Physician (Optometry) Kathrynn Ducking, MD as Consulting Physician (Neurology)  Advanced Directive information    No Known Allergies  Chief Complaint  Patient presents with  . Acute Visit    Left hand concerns      HPI: Patient is a 73 y.o. male seen in the office today due to ongoing pain, swelling and redness to left hand. Previously pt stated it was a splitter and today patient reports he was thinking and it was actually a nail/screw that caused the trauma. He has received his updated td vaccine and prior to that it had been 7 years. Cbc obtained at last visit and no elevation in wbc Completed doxycycline. Overall pain has improved but tender when touched. Continues to be swollen with slight redness. He did yard work this weekend.   Review of Systems:  Review of Systems  Constitutional: Negative for chills, fever and malaise/fatigue.  Musculoskeletal: Positive for joint pain (to base of finger to left hand) and myalgias.    Past Medical History:  Diagnosis Date  . Arthritis    lt ankle  . Coronary artery disease    a. BMS to RCA 2003 with residual LAD/diag disease treated medically, normal EF.  Marland Kitchen Depression   . GERD (gastroesophageal reflux disease)   . Hyperkalemia    a. K of 5.2 in 2017.  Marland Kitchen Hyperlipidemia   . Hypertension   . Myocardial infarction ([place]) 11/15/2001   Dr. Daneen Schick [facility] Cardiology)  . OSA (obstructive sleep apnea)    Per [facility] New Patient Packet  . Peripheral neuropathy    Left Foot, Per [place] New Patient Packet  . Pre-diabetes   . Sleep apnea    had test several yr ago-said he did  not need a cpap-still snores  . Torn rotator cuff    Per [place] Patient Packet  . Venous insufficiency of left leg    Per [place] Patient Packet  . Wears glasses    Past Surgical History:  Procedure Laterality Date  . CARDIAC CATHETERIZATION  23003   stent rca  . COLONOSCOPY    . coronary artery stent  11/15/2001  . CYST REMOVAL TRUNK Left 05/08/2013   Procedure: CYST REMOVAL BACK;  Surgeon: Adin Hector, MD;  Location: [place];  Service: General;  Laterality: Left;  . INGUINAL HERNIA REPAIR Left 03/24/2015   Procedure: OPEN REPAIR LEFT INGUINAL HERNIA ;  Surgeon: Fanny Skates, MD;  Location: [place];  Service: General;  Laterality: Left;  . INSERTION OF MESH Left 03/24/2015   Procedure: INSERTION OF MESH;  Surgeon: Fanny Skates, MD;  Location: [place];  Service: General;  Laterality: Left;  . SHOULDER ARTHROSCOPY W/ ROTATOR CUFF REPAIR  2011   right  . SPINE SURGERY  02/12/1993   L2, L3 fragmented disc  . VASECTOMY  1987   Social History:   reports that he quit smoking about 44 years ago. His smoking use included cigarettes. He quit after 12.00 years of use. He has never used smokeless tobacco. He reports current alcohol use of about 14.0 standard drinks of alcohol per week. He reports that he does not use drugs.  Family  History  Problem Relation Age of Onset  . Macular degeneration Mother   . COPD Mother   . Non-Hodgkin's lymphoma Mother   . Lung cancer Mother        Per [facility] New Patient Packet   . Hypertension Father   . Suicidality Father        Per [facility] New Patient Packet   . Depression Father   . High Cholesterol Sister   . Macular degeneration Maternal Grandmother   . Autism Son   . Post-traumatic stress disorder Son   . Diabetes type II Daughter   . Breast cancer Neg Hx     Medications: Patient's Medications  New Prescriptions   No medications on file  Previous Medications   AMLODIPINE (NORVASC) 5 MG  TABLET    TAKE 1 TABLET BY MOUTH  DAILY   ASPIRIN 81 MG TABLET    Take 1 tablet (81 mg total) by mouth daily.   ATORVASTATIN (LIPITOR) 80 MG TABLET    Take 1 tablet (80 mg total) by mouth daily.   CARVEDILOL (COREG) 12.5 MG TABLET    Take 1 tablet (12.5 mg total) by mouth 2 (two) times daily.   DOXYCYCLINE (VIBRA-TABS) 100 MG TABLET    Take 1 tablet (100 mg total) by mouth 2 (two) times daily.   MULTIPLE VITAMIN (MULTIVITAMIN WITH MINERALS) TABS TABLET    Take 1 tablet by mouth daily.   MULTIPLE VITAMINS-MINERALS (PRESERVISION AREDS PO)    Take 1 capsule by mouth 2 (two) times daily.   OMEPRAZOLE PO    Take 20 mg by mouth every morning.    RAMIPRIL (ALTACE) 2.5 MG CAPSULE    Take 1 capsule (2.5 mg total) by mouth daily.   SERTRALINE (ZOLOFT) 50 MG TABLET    Take 50 mg by mouth daily.  Modified Medications   No medications on file  Discontinued Medications   No medications on file     Physical Exam:  Vitals:   04/11/18 0857  BP: 122/78  Pulse: 61  Resp: 10  Temp: (!) 97.4 F (36.3 C)  TempSrc: Oral  SpO2: 97%  Weight: 206 lb 9.6 oz (93.7 kg)  Height: 5\' 7"  (1.702 m)   Body mass index is 32.36 kg/m.  Physical Exam Constitutional:      General: He is not in acute distress.    Appearance: He is well-developed. He is not diaphoretic.  Musculoskeletal:     Left hand: He exhibits tenderness. He exhibits normal range of motion. Normal sensation noted. Normal strength noted.       Hands:     Comments: Tenderness noted at the 3rd and 4th MCP joints. Continues to have redness and swelling, no heat. No open area  Skin:    General: Skin is warm and dry.  Neurological:     Mental Status: He is alert and oriented to person, place, and time.     Labs reviewed: Basic Metabolic Panel: Recent Labs    06/02/17 0903 04/05/18 0934  NA 139 137  K 4.3 4.3  CL 106 105  CO2 26 26  GLUCOSE 112 127*  BUN 22 31*  CREATININE 0.88 0.95  CALCIUM 9.5 9.0   Liver Function  Tests: Recent Labs    06/02/17 0903 04/05/18 0934  AST 25 22  ALT 27 28  ALKPHOS 80  --   BILITOT 0.7 0.5  PROT 7.1 6.3  ALBUMIN 3.8  --    No results for input(s): LIPASE, AMYLASE in the  last 8760 hours. No results for input(s): AMMONIA in the last 8760 hours. CBC: Recent Labs    06/02/17 0903 04/05/18 0934  WBC 5.2 5.8  NEUTROABS 3.5 3,637  HGB 14.2 14.1  HCT 42.8 41.5  MCV 93.6 92.8  PLT 174 174   Lipid Panel: Recent Labs    04/05/18 0934  CHOL 130  HDL 55  LDLCALC 62  TRIG 54  CHOLHDL 2.4   TSH: No results for input(s): TSH in the last 8760 hours. A1C: Lab Results  Component Value Date   HGBA1C 5.9 (H) 04/05/2018     Assessment/Plan 1. Localized edema 2. Left hand pain Overall improving, continue to monitor. encouraged to minimize use of hand (has been doing strenuous yard work). To complete doxycyline. Tetanus UTD. To follow up if worsening of pain, redness or swelling occurs.   Carlos American. [place], [facility] [phone]

## 2018-04-14 ENCOUNTER — Ambulatory Visit: Payer: Medicare Other | Admitting: Interventional Cardiology

## 2018-04-26 ENCOUNTER — Encounter: Payer: Self-pay | Admitting: Nurse Practitioner

## 2018-04-26 ENCOUNTER — Ambulatory Visit: Payer: Medicare Other | Admitting: Nurse Practitioner

## 2018-04-26 ENCOUNTER — Ambulatory Visit (INDEPENDENT_AMBULATORY_CARE_PROVIDER_SITE_OTHER): Payer: Medicare Other | Admitting: Nurse Practitioner

## 2018-04-26 ENCOUNTER — Other Ambulatory Visit: Payer: Self-pay

## 2018-04-26 VITALS — BP 140/82 | HR 67 | Temp 97.5°F | Ht 67.0 in | Wt 202.8 lb

## 2018-04-26 DIAGNOSIS — R739 Hyperglycemia, unspecified: Secondary | ICD-10-CM

## 2018-04-26 DIAGNOSIS — Z0000 Encounter for general adult medical examination without abnormal findings: Secondary | ICD-10-CM | POA: Diagnosis not present

## 2018-04-26 DIAGNOSIS — R2232 Localized swelling, mass and lump, left upper limb: Secondary | ICD-10-CM | POA: Diagnosis not present

## 2018-04-26 DIAGNOSIS — I2510 Atherosclerotic heart disease of native coronary artery without angina pectoris: Secondary | ICD-10-CM

## 2018-04-26 DIAGNOSIS — I10 Essential (primary) hypertension: Secondary | ICD-10-CM | POA: Diagnosis not present

## 2018-04-26 DIAGNOSIS — F325 Major depressive disorder, single episode, in full remission: Secondary | ICD-10-CM | POA: Diagnosis not present

## 2018-04-26 DIAGNOSIS — E785 Hyperlipidemia, unspecified: Secondary | ICD-10-CM

## 2018-04-26 NOTE — Patient Instructions (Signed)
Tim Walters , Thank you for taking time to come for your Medicare Wellness Visit. I appreciate your ongoing commitment to your health goals. Please review the following plan we discussed and let me know if I can assist you in the future.   Screening recommendations/referrals: Colonoscopy -awaiting records Recommended yearly ophthalmology/optometry visit for glaucoma screening and checkup Recommended yearly dental visit for hygiene and checkup  Vaccinations: awaiting records Influenza vaccine  Pneumococcal vaccine  Tdap vaccine  Shingles vaccine     Advanced directives: complete and bring to follow up.   Conditions/risks identified:   Next appointment: 1 year for AWV  Preventive Care 75 Years and Older, Male Preventive care refers to lifestyle choices and visits with your health care provider that can promote health and wellness. What does preventive care include?  A yearly physical exam. This is also called an annual well check.  Dental exams once or twice a year.  Routine eye exams. Ask your health care provider how often you should have your eyes checked.  Personal lifestyle choices, including:  Daily care of your teeth and gums.  Regular physical activity.  Eating a healthy diet.  Avoiding tobacco and drug use.  Limiting alcohol use.  Practicing safe sex.  Taking low doses of aspirin every day.  Taking vitamin and mineral supplements as recommended by your health care provider. What happens during an annual well check? The services and screenings done by your health care provider during your annual well check will depend on your age, overall health, lifestyle risk factors, and family history of disease. Counseling  Your health care provider may ask you questions about your:  Alcohol use.  Tobacco use.  Drug use.  Emotional well-being.  Home and relationship well-being.  Sexual activity.  Eating habits.  History of falls.  Memory and ability to  understand (cognition).  Work and work Statistician. Screening  You may have the following tests or measurements:  Height, weight, and BMI.  Blood pressure.  Lipid and cholesterol levels. These may be checked every 5 years, or more frequently if you are over 63 years old.  Skin check.  Lung cancer screening. You may have this screening every year starting at age 35 if you have a 30-pack-year history of smoking and currently smoke or have quit within the past 15 years.  Fecal occult blood test (FOBT) of the stool. You may have this test every year starting at age 50.  Flexible sigmoidoscopy or colonoscopy. You may have a sigmoidoscopy every 5 years or a colonoscopy every 10 years starting at age 75.  Prostate cancer screening. Recommendations will vary depending on your family history and other risks.  Hepatitis C blood test.  Hepatitis B blood test.  Sexually transmitted disease (STD) testing.  Diabetes screening. This is done by checking your blood sugar (glucose) after you have not eaten for a while (fasting). You may have this done every 1-3 years.  Abdominal aortic aneurysm (AAA) screening. You may need this if you are a current or former smoker.  Osteoporosis. You may be screened starting at age 68 if you are at high risk. Talk with your health care provider about your test results, treatment options, and if necessary, the need for more tests. Vaccines  Your health care provider may recommend certain vaccines, such as:  Influenza vaccine. This is recommended every year.  Tetanus, diphtheria, and acellular pertussis (Tdap, Td) vaccine. You may need a Td booster every 10 years.  Zoster vaccine. You may need  this after age 41.  Pneumococcal 13-valent conjugate (PCV13) vaccine. One dose is recommended after age 8.  Pneumococcal polysaccharide (PPSV23) vaccine. One dose is recommended after age 22. Talk to your health care provider about which screenings and vaccines you  need and how often you need them. This information is not intended to replace advice given to you by your health care provider. Make sure you discuss any questions you have with your health care provider. Document Released: 02/28/2015 Document Revised: 10/22/2015 Document Reviewed: 12/03/2014 Elsevier Interactive Patient Education  2017 [place] Prevention in the Home Falls can cause injuries. They can happen to people of all ages. There are many things you can do to make your home safe and to help prevent falls. What can I do on the outside of my home?  Regularly fix the edges of walkways and driveways and fix any cracks.  Remove anything that might make you trip as you walk through a door, such as a raised step or threshold.  Trim any bushes or trees on the path to your home.  Use bright outdoor lighting.  Clear any walking paths of anything that might make someone trip, such as rocks or tools.  Regularly check to see if handrails are loose or broken. Make sure that both sides of any steps have handrails.  Any raised decks and porches should have guardrails on the edges.  Have any leaves, snow, or ice cleared regularly.  Use sand or salt on walking paths during winter.  Clean up any spills in your garage right away. This includes oil or grease spills. What can I do in the bathroom?  Use night lights.  Install grab bars by the toilet and in the tub and shower. Do not use towel bars as grab bars.  Use non-skid mats or decals in the tub or shower.  If you need to sit down in the shower, use a plastic, non-slip stool.  Keep the floor dry. Clean up any water that spills on the floor as soon as it happens.  Remove soap buildup in the tub or shower regularly.  Attach bath mats securely with double-sided non-slip rug tape.  Do not have throw rugs and other things on the floor that can make you trip. What can I do in the bedroom?  Use night lights.  Make sure  that you have a light by your bed that is easy to reach.  Do not use any sheets or blankets that are too big for your bed. They should not hang down onto the floor.  Have a firm chair that has side arms. You can use this for support while you get dressed.  Do not have throw rugs and other things on the floor that can make you trip. What can I do in the kitchen?  Clean up any spills right away.  Avoid walking on wet floors.  Keep items that you use a lot in easy-to-reach places.  If you need to reach something above you, use a strong step stool that has a grab bar.  Keep electrical cords out of the way.  Do not use floor polish or wax that makes floors slippery. If you must use wax, use non-skid floor wax.  Do not have throw rugs and other things on the floor that can make you trip. What can I do with my stairs?  Do not leave any items on the stairs.  Make sure that there are handrails on both sides of  the stairs and use them. Fix handrails that are broken or loose. Make sure that handrails are as long as the stairways.  Check any carpeting to make sure that it is firmly attached to the stairs. Fix any carpet that is loose or worn.  Avoid having throw rugs at the top or bottom of the stairs. If you do have throw rugs, attach them to the floor with carpet tape.  Make sure that you have a light switch at the top of the stairs and the bottom of the stairs. If you do not have them, ask someone to add them for you. What else can I do to help prevent falls?  Wear shoes that:  Do not have high heels.  Have rubber bottoms.  Are comfortable and fit you well.  Are closed at the toe. Do not wear sandals.  If you use a stepladder:  Make sure that it is fully opened. Do not climb a closed stepladder.  Make sure that both sides of the stepladder are locked into place.  Ask someone to hold it for you, if possible.  Clearly mark and make sure that you can see:  Any grab bars or  handrails.  First and last steps.  Where the edge of each step is.  Use tools that help you move around (mobility aids) if they are needed. These include:  Canes.  Walkers.  Scooters.  Crutches.  Turn on the lights when you go into a dark area. Replace any light bulbs as soon as they burn out.  Set up your furniture so you have a clear path. Avoid moving your furniture around.  If any of your floors are uneven, fix them.  If there are any pets around you, be aware of where they are.  Review your medicines with your doctor. Some medicines can make you feel dizzy. This can increase your chance of falling. Ask your doctor what other things that you can do to help prevent falls. This information is not intended to replace advice given to you by your health care provider. Make sure you discuss any questions you have with your health care provider. Document Released: 11/28/2008 Document Revised: 07/10/2015 Document Reviewed: 03/08/2014 Elsevier Interactive Patient Education  2017 [employer].

## 2018-04-26 NOTE — Progress Notes (Signed)
Subjective:   Tim Walters is a 73 y.o. male who presents for a Welcome to Medicare exam.   Review of Systems:  Cardiac Risk Factors include: advanced age (>23men, >16 women);dyslipidemia;hypertension;male gender;sedentary lifestyle     Objective:    Today's Vitals   04/26/18 1426  BP: 140/82  Pulse: 67  Temp: (!) 97.5 F (36.4 C)  TempSrc: Oral  SpO2: 96%  Weight: 202 lb 12.8 oz (92 kg)  Height: 5\' 7"  (1.702 m)   Body mass index is 31.76 kg/m.  Medications Outpatient Encounter Medications as of 04/26/2018  Medication Sig  . amLODipine (NORVASC) 5 MG tablet TAKE 1 TABLET BY MOUTH  DAILY  . aspirin 81 MG tablet Take 1 tablet (81 mg total) by mouth daily.  Marland Kitchen atorvastatin (LIPITOR) 80 MG tablet Take 1 tablet (80 mg total) by mouth daily.  . carvedilol (COREG) 12.5 MG tablet Take 1 tablet (12.5 mg total) by mouth 2 (two) times daily.  . Multiple Vitamin (MULTIVITAMIN WITH MINERALS) TABS tablet Take 1 tablet by mouth daily.  . Multiple Vitamins-Minerals (PRESERVISION AREDS PO) Take 1 capsule by mouth 2 (two) times daily.  Marland Kitchen OMEPRAZOLE PO Take 20 mg by mouth every morning.   . ramipril (ALTACE) 2.5 MG capsule Take 1 capsule (2.5 mg total) by mouth daily.  . sertraline (ZOLOFT) 50 MG tablet Take 50 mg by mouth daily.  . [DISCONTINUED] doxycycline (VIBRA-TABS) 100 MG tablet Take 1 tablet (100 mg total) by mouth 2 (two) times daily. (Patient not taking: Reported on 04/26/2018)   No facility-administered encounter medications on file as of 04/26/2018.      History: Past Medical History:  Diagnosis Date  . Arthritis    lt ankle  . Coronary artery disease    a. BMS to RCA 2003 with residual LAD/diag disease treated medically, normal EF.  Marland Kitchen Depression   . GERD (gastroesophageal reflux disease)   . Hyperkalemia    a. K of 5.2 in 2017.  Marland Kitchen Hyperlipidemia   . Hypertension   . Myocardial infarction ([place]) 11/15/2001   Dr. Daneen Schick [facility] Cardiology)  . OSA (obstructive  sleep apnea)    Per [facility] New Patient Packet  . Peripheral neuropathy    Left Foot, Per [place] New Patient Packet  . Pre-diabetes   . Sleep apnea    had test several yr ago-said he did not need a cpap-still snores  . Torn rotator cuff    Per [place] Patient Packet  . Venous insufficiency of left leg    Per [place] Patient Packet  . Wears glasses    Past Surgical History:  Procedure Laterality Date  . CARDIAC CATHETERIZATION  23003   stent rca  . COLONOSCOPY    . coronary artery stent  11/15/2001  . CYST REMOVAL TRUNK Left 05/08/2013   Procedure: CYST REMOVAL BACK;  Surgeon: Adin Hector, MD;  Location: [place];  Service: General;  Laterality: Left;  . INGUINAL HERNIA REPAIR Left 03/24/2015   Procedure: OPEN REPAIR LEFT INGUINAL HERNIA ;  Surgeon: Fanny Skates, MD;  Location: [place];  Service: General;  Laterality: Left;  . INSERTION OF MESH Left 03/24/2015   Procedure: INSERTION OF MESH;  Surgeon: Fanny Skates, MD;  Location: [place];  Service: General;  Laterality: Left;  . SHOULDER ARTHROSCOPY W/ ROTATOR CUFF REPAIR  2011   right  . SPINE SURGERY  02/12/1993   L2, L3 fragmented disc  . VASECTOMY  23    Family History  Problem Relation Age of Onset  . Macular degeneration Mother   . COPD Mother   . Non-Hodgkin's lymphoma Mother   . Lung cancer Mother        Per [facility] New Patient Packet   . Hypertension Father   . Suicidality Father        Per [facility] New Patient Packet   . Depression Father   . High Cholesterol Sister   . Macular degeneration Maternal Grandmother   . Autism Son   . Post-traumatic stress disorder Son   . Diabetes type II Daughter   . Breast cancer Neg Hx    Social History   Occupational History  . Occupation: Retired  Tobacco Use  . Smoking status: Former Smoker    Years: 12.00    Types: Cigarettes    Last attempt to quit: 02/15/1974    Years since quitting: 44.2  . Smokeless tobacco: Never Used   Substance and Sexual Activity  . Alcohol use: Yes    Alcohol/week: 14.0 standard drinks    Types: 14 Standard drinks or equivalent per week    Comment: 14-21 drinks weekly in the evening   . Drug use: No  . Sexual activity: Not on file   Tobacco Counseling Counseling given: Not Answered   Immunizations and Health Maintenance Immunization History  Administered Date(s) Administered  . Influenza, Seasonal, Injecte, Preservative Fre 02/15/2012  . Influenza-Unspecified 01/15/2018  . Tdap 04/10/2018   Health Maintenance Due  Topic Date Due  . Hepatitis C Screening  08-Jul-1945  . COLONOSCOPY  11/27/1993  . PNA vac Low Risk Adult (1 of 2 - PCV13) 11/27/2008    Activities of Daily Living In your present state of health, do you have any difficulty performing the following activities: 04/26/2018  Hearing? N  Vision? N  Difficulty concentrating or making decisions? N  Walking or climbing stairs? N  Dressing or bathing? N  Doing errands, shopping? N  Preparing Food and eating ? N  Using the Toilet? N  In the past six months, have you accidently leaked urine? N  Do you have problems with loss of bowel control? N  Managing your Medications? N  Managing your Finances? N  Housekeeping or managing your Housekeeping? N  Some recent data might be hidden    Physical Exam   Advanced Directives:      Assessment:    This is a routine wellness  examination for this patient .   Vision/Hearing screen  Visual Acuity Screening   Right eye Left eye Both eyes  Without correction: 20/25 20/30 20/25   With correction:       Dietary issues and exercise activities discussed:  Current Exercise Habits: Structured exercise class, Type of exercise: calisthenics;strength training/weights;treadmill, Time (Minutes): 60, Frequency (Times/Week): 5, Weekly Exercise (Minutes/Week): 300, Intensity: Moderate  Goals    . Weight (lb) < 186 lb (84.4 kg) (pt-stated)       Depression Screen PHQ 2/9  Scores 04/26/2018 03/28/2018  PHQ - 2 Score 0 0     Fall Risk Fall Risk  04/26/2018  Falls in the past year? 0  Number falls in past yr: 0  Injury with Fall? 0    Cognitive Function MMSE - Mini Mental State Exam 04/26/2018  Orientation to time 5  Orientation to Place 5  Registration 3  Recall 3  Language- name 2 objects 2  Language- repeat 1  Language- follow 3 step command 3  Language- read &  follow direction 1  Write a sentence 1  Copy design 1        Patient Care Team: Lauree Chandler, NP as PCP - General (Geriatric Medicine) Fanny Skates, MD as Consulting Physician (General Surgery) Marchia Bond, MD as Consulting Physician (Orthopedic Surgery) Jarome Matin, MD as Consulting Physician (Dermatology) Belva Crome, MD as Consulting Physician (Cardiology) Dyke Maes, OD as Referring Physician (Optometry) Kathrynn Ducking, MD as Consulting Physician (Neurology)     Plan:     I have personally reviewed and noted the following in the patient's chart:   . Medical and social history . Use of alcohol, tobacco or illicit drugs  . Current medications and supplements . Functional ability and status . Nutritional status . Physical activity . Advanced directives . List of other physicians . Hospitalizations, surgeries, and ER visits in previous 12 months . Vitals . Screenings to include cognitive, depression, and falls . Referrals and appointments  In addition, I have reviewed and discussed with patient certain preventive protocols, quality metrics, and best practice recommendations. A written personalized care plan for preventive services as well as general preventive health recommendations were provided to patient.    Lauree Chandler, NP 04/26/2018

## 2018-04-26 NOTE — Progress Notes (Signed)
Provider: Lauree Chandler, NP  Patient Care Team: Lauree Chandler, NP as PCP - General (Geriatric Medicine) Fanny Skates, MD as Consulting Physician (General Surgery) Marchia Bond, MD as Consulting Physician (Orthopedic Surgery) Jarome Matin, MD as Consulting Physician (Dermatology) Belva Crome, MD as Consulting Physician (Cardiology) Dyke Maes, OD as Referring Physician (Optometry) Kathrynn Ducking, MD as Consulting Physician (Neurology)  Extended Emergency Contact Information Primary Emergency Contact: Darnelle Spangle Address: [address]          [place], [place] [address] Johnnette Litter of [place] Phone: [phone] Mobile Phone: [phone] Relation: Spouse No Known Allergies Code Status: FULL Goals of Care: Advanced Directive information Advanced Directives 03/28/2018  Does Patient Have a Medical Advance Directive? No  Type of Advance Directive -  Does patient want to make changes to medical advance directive? -  Copy of [place] in Chart? -  Would patient like information on creating a medical advance directive? Yes (MAU/Ambulatory/Procedural Areas - Information given)     Chief Complaint  Patient presents with  . Annual Exam    Yearly check-up, will see Dr.Smith in April 2020 for EKG up date, and AWV completed today.   . Hand Problem    Re-examine left hand. Patient c/o tenderness in hand  . Arm Problem    Raised area on left forearm x few months    HPI: Patient is a 73 y.o. male seen in today for an annual wellness exam.    Diet? Liberalized but tries to avoid carbs and sugars but does not always.   Exercise? 50-75 mins 5 days week at the gym   Dentition: twice yearly  Ophthalmology appt: yearly  Routine specialist: Cardiologist, follows EKG, Dermatology occasionally  Hyperlipidemia- taking lipitor 80 mg daily;  LDL at goal   htn- controlled on norvasc, coreg and amipril  CAD- continues on asa, no  chest pain, following with cardiology  Depression- controlled on zoloft.  Depression screen [facility] 2/9 04/26/2018 03/28/2018  Decreased Interest 0 0  Down, Depressed, Hopeless 0 0  PHQ - 2 Score 0 0    Fall Risk  04/26/2018 04/11/2018 03/28/2018  Falls in the past year? 0 0 0  Number falls in past yr: 0 0 0  Injury with Fall? 0 0 0   MMSE - Mini Mental State Exam 04/26/2018  Orientation to time 5  Orientation to Place 5  Registration 3  Recall 3  Language- name 2 objects 2  Language- repeat 1  Language- follow 3 step command 3  Language- read & follow direction 1  Write a sentence 1  Copy design 1     Health Maintenance  Topic Date Due  . Hepatitis C Screening  [birth year]  . COLONOSCOPY  11/27/1993  . PNA vac Low Risk Adult (1 of 2 - PCV13) 11/27/2008  . TETANUS/TDAP  04/10/2028  . INFLUENZA VACCINE  Completed    Past Medical History:  Diagnosis Date  . Arthritis    lt ankle  . Coronary artery disease    a. BMS to RCA 2003 with residual LAD/diag disease treated medically, normal EF.  Marland Kitchen Depression   . GERD (gastroesophageal reflux disease)   . Hyperkalemia    a. K of 5.2 in 2017.  Marland Kitchen Hyperlipidemia   . Hypertension   . Myocardial infarction ([place]) 11/15/2001   Dr. Daneen Schick [facility] Cardiology)  . OSA (obstructive sleep apnea)    Per [facility] New Patient Packet  . Peripheral neuropathy  Left Foot, Per [place] New Patient Packet  . Pre-diabetes   . Sleep apnea    had test several yr ago-said he did not need a cpap-still snores  . Torn rotator cuff    Per [place] Patient Packet  . Venous insufficiency of left leg    Per [place] Patient Packet  . Wears glasses     Past Surgical History:  Procedure Laterality Date  . CARDIAC CATHETERIZATION  23003   stent rca  . COLONOSCOPY    . coronary artery stent  11/15/2001  . CYST REMOVAL TRUNK Left 05/08/2013   Procedure: CYST REMOVAL BACK;  Surgeon: Adin Hector, MD;  Location: [place];  Service: General;   Laterality: Left;  . INGUINAL HERNIA REPAIR Left 03/24/2015   Procedure: OPEN REPAIR LEFT INGUINAL HERNIA ;  Surgeon: Fanny Skates, MD;  Location: [place];  Service: General;  Laterality: Left;  . INSERTION OF MESH Left 03/24/2015   Procedure: INSERTION OF MESH;  Surgeon: Fanny Skates, MD;  Location: [place];  Service: General;  Laterality: Left;  . SHOULDER ARTHROSCOPY W/ ROTATOR CUFF REPAIR  2011   right  . SPINE SURGERY  02/12/1993   L2, L3 fragmented disc  . VASECTOMY  1987    Social History   Socioeconomic History  . Marital status: Married    Spouse name: Katharine Look  . Number of children: 7  . Years of education: 27  . Highest education level: Not on file  Occupational History  . Occupation: Retired  Scientific laboratory technician  . Financial resource strain: Not on file  . Food insecurity:    Worry: Not on file    Inability: Not on file  . Transportation needs:    Medical: Not on file    Non-medical: Not on file  Tobacco Use  . Smoking status: Former Smoker    Years: 12.00    Types: Cigarettes    Last attempt to quit: 02/15/1974    Years since quitting: 44.2  . Smokeless tobacco: Never Used  Substance and Sexual Activity  . Alcohol use: Yes    Alcohol/week: 14.0 standard drinks    Types: 14 Standard drinks or equivalent per week    Comment: 14-21 drinks weekly in the evening   . Drug use: No  . Sexual activity: Not on file  Lifestyle  . Physical activity:    Days per week: Not on file    Minutes per session: Not on file  . Stress: Not on file  Relationships  . Social connections:    Talks on phone: Not on file    Gets together: Not on file    Attends religious service: Not on file    Active member of club or organization: Not on file    Attends meetings of clubs or organizations: Not on file    Relationship status: Not on file  Other Topics Concern  . Not on file  Social History Narrative   Lives with wife, Katharine Look   Caffeine use: Coffee  daily   Caffeine free soda      As of 03/28/2018:   Diet: N/A      Caffeine: Yes      Married, if yes what year: Yes, 1971      Do you live in a house, apartment, assisted living, condo, trailer, ect: House, one stories, 2 persons      Pets: 1 cat      Current/Past profession: Dietitian,  Contractor      Exercise: Yes, cardio strength 5 days weekly          Living Will: No   DNR: No, would like to discuss   POA/HPOA: Yes      Functional Status:   Do you have difficulty bathing or dressing yourself? No   Do you have difficulty preparing food or eating? No   Do you have difficulty managing your medications? No   Do you have difficulty managing your finances? No   Do you have difficulty affording your medications? No    Family History  Problem Relation Age of Onset  . Macular degeneration Mother   . COPD Mother   . Non-Hodgkin's lymphoma Mother   . Lung cancer Mother        Per [facility] New Patient Packet   . Hypertension Father   . Suicidality Father        Per [facility] New Patient Packet   . Depression Father   . High Cholesterol Sister   . Macular degeneration Maternal Grandmother   . Autism Son   . Post-traumatic stress disorder Son   . Diabetes type II Daughter   . Breast cancer Neg Hx     Review of Systems:  Review of Systems  Constitutional: Negative for chills, fever and weight loss.  HENT: Negative for tinnitus.   Respiratory: Negative for cough, sputum production and shortness of breath.   Cardiovascular: Negative for chest pain, palpitations and leg swelling.  Gastrointestinal: Negative for abdominal pain, constipation, diarrhea and heartburn.  Genitourinary: Positive for frequency. Negative for dysuria and urgency.       No changes in urinary frequency.   Musculoskeletal: Negative for back pain, falls, joint pain and myalgias.  Skin: Negative.   Neurological: Positive for tingling (neuropathy in left foot). Negative for dizziness and headaches.   Psychiatric/Behavioral: Negative for depression and memory loss. The patient does not have insomnia.      Allergies as of 04/26/2018   No Known Allergies     Medication List       Accurate as of April 26, 2018  2:58 PM. Always use your most recent med list.        amLODipine 5 MG tablet Commonly known as:  NORVASC TAKE 1 TABLET BY MOUTH  DAILY   aspirin 81 MG tablet Take 1 tablet (81 mg total) by mouth daily.   atorvastatin 80 MG tablet Commonly known as:  LIPITOR Take 1 tablet (80 mg total) by mouth daily.   carvedilol 12.5 MG tablet Commonly known as:  COREG Take 1 tablet (12.5 mg total) by mouth 2 (two) times daily.   multivitamin with minerals Tabs tablet Take 1 tablet by mouth daily.   OMEPRAZOLE PO Take 20 mg by mouth every morning.   PRESERVISION AREDS PO Take 1 capsule by mouth 2 (two) times daily.   ramipril 2.5 MG capsule Commonly known as:  ALTACE Take 1 capsule (2.5 mg total) by mouth daily.   sertraline 50 MG tablet Commonly known as:  ZOLOFT Take 50 mg by mouth daily.         Physical Exam: Vitals:   04/26/18 1427  BP: 140/82  Pulse: 67  Temp: (!) 97.5 F (36.4 C)  TempSrc: Oral  SpO2: 96%  Weight: 202 lb 12.8 oz (92 kg)  Height: 5\' 7"  (1.702 m)   Body mass index is 31.76 kg/m. Physical Exam Exam conducted with a chaperone present.  Constitutional:  General: He is not in acute distress.    Appearance: He is well-developed. He is not diaphoretic.  HENT:     Head: Normocephalic and atraumatic.     Right Ear: Tympanic membrane, ear canal and external ear normal.     Left Ear: Tympanic membrane, ear canal and external ear normal.     Nose: Nose normal.     Mouth/Throat:     Mouth: Mucous membranes are moist.     Pharynx: Oropharynx is clear. No oropharyngeal exudate.  Eyes:     Conjunctiva/sclera: Conjunctivae normal.     Pupils: Pupils are equal, round, and reactive to light.  Neck:     Musculoskeletal: Normal range of  motion and neck supple.  Cardiovascular:     Rate and Rhythm: Normal rate and regular rhythm.     Heart sounds: Normal heart sounds.  Pulmonary:     Effort: Pulmonary effort is normal.     Breath sounds: Normal breath sounds.  Abdominal:     General: Bowel sounds are normal.     Palpations: Abdomen is soft.  Musculoskeletal:     Left hand: He exhibits tenderness. He exhibits normal range of motion. Normal sensation noted. Normal strength noted.       Hands:     Right lower leg: No edema.     Left lower leg: No edema.     Comments: Tenderness with fluid noted in the web around 3rd MCP joint Continues to have redness and swelling, no heat. No open area  Skin:    General: Skin is warm and dry.  Neurological:     Mental Status: He is alert and oriented to person, place, and time.  Psychiatric:        Mood and Affect: Mood normal.        Behavior: Behavior normal.     Labs reviewed: Basic Metabolic Panel: Recent Labs    06/02/17 0903 04/05/18 0934  NA 139 137  K 4.3 4.3  CL 106 105  CO2 26 26  GLUCOSE 112 127*  BUN 22 31*  CREATININE 0.88 0.95  CALCIUM 9.5 9.0   Liver Function Tests: Recent Labs    06/02/17 0903 04/05/18 0934  AST 25 22  ALT 27 28  ALKPHOS 80  --   BILITOT 0.7 0.5  PROT 7.1 6.3  ALBUMIN 3.8  --    No results for input(s): LIPASE, AMYLASE in the last 8760 hours. No results for input(s): AMMONIA in the last 8760 hours. CBC: Recent Labs    06/02/17 0903 04/05/18 0934  WBC 5.2 5.8  NEUTROABS 3.5 3,637  HGB 14.2 14.1  HCT 42.8 41.5  MCV 93.6 92.8  PLT 174 174   Lipid Panel: Recent Labs    04/05/18 0934  CHOL 130  HDL 55  LDLCALC 62  TRIG 54  CHOLHDL 2.4   Lab Results  Component Value Date   HGBA1C 5.9 (H) 04/05/2018    Procedures: Dg Hand Complete Left  Result Date: 04/03/2018 CLINICAL DATA:  History of splinter in the base of the middle finger with persistent pain and swelling. Evaluate for foreign body. EXAM: LEFT HAND -  COMPLETE 3+ VIEW COMPARISON:  None. FINDINGS: The mineralization and alignment are normal. There is no evidence of acute fracture or dislocation. There are osteoarthritic changes at the 2nd metacarpal phalangeal and the 1st carpometacarpal joints. There is minimal interphalangeal involvement. No radiopaque foreign body, soft tissue emphysema or bone destruction identified. IMPRESSION: No evidence of radiopaque  foreign body in the hand. Osteoarthritis, greatest at the 2nd MCP and 1st CMC joints. Electronically Signed   By: Richardean Sale M.D.   On: 04/03/2018 17:00    Assessment/Plan 1. Localized swelling on left hand Pt with 8 week hx of redness, swelling, tenderness to left hand after injury, originally thought it was a splitter but later came back and said it was nail/screw puncture wound. Td was sent to pharmacy after initial visit however. He was then treated for cellulitis (no elevation in wbc noted with cbc) with doxycycline however without much improvement in redness, swelling. Pain overall has improved. xray without significant findings. Now with ongoing swelling around 3rd MCP joint with edema noted. Non-tender at this time but redness persist. - Ambulatory referral to Orthopedics  2. Essential hypertension, benign Controlled on current regimen.  3. Major depressive disorder in full remission, unspecified whether recurrent ([place]) Stable on zoloft. Will continue current regimen.   4. Hyperlipidemia, unspecified hyperlipidemia type LDL at goal, continues on lipitor 80 mg daily   5. Atherosclerosis of native coronary artery of native heart without angina pectoris Stable, continues to follow up with cardiology, remains on ASA  6. Hyperglycemia A1c at 5.9, aware of diet modifications.   7. Wellness examination AWV completed today, awaiting records from previous PCP at this time.    Next appt: 6 months with labs before visit.  Carlos American. [place], [facility]  [phone]

## 2018-04-27 ENCOUNTER — Encounter: Payer: Self-pay | Admitting: Nurse Practitioner

## 2018-04-28 ENCOUNTER — Telehealth: Payer: Self-pay | Admitting: *Deleted

## 2018-04-28 NOTE — Telephone Encounter (Signed)
Pt left message on VM, asking for referral to Dr. Alen Blew, pt states he had another spot show up on his hand. He has an appt with [facility] ortho Wednesday but think hematology would be a better option. Please advise

## 2018-04-28 NOTE — Telephone Encounter (Signed)
Lets have him see orthopedic first and see what their finds are.

## 2018-05-01 NOTE — Telephone Encounter (Signed)
Spoke with patient and he will start with ortho first

## 2018-05-02 ENCOUNTER — Other Ambulatory Visit: Payer: Self-pay

## 2018-05-02 ENCOUNTER — Ambulatory Visit (INDEPENDENT_AMBULATORY_CARE_PROVIDER_SITE_OTHER): Payer: Medicare Other | Admitting: Orthopaedic Surgery

## 2018-05-02 ENCOUNTER — Encounter (INDEPENDENT_AMBULATORY_CARE_PROVIDER_SITE_OTHER): Payer: Self-pay | Admitting: Orthopaedic Surgery

## 2018-05-02 ENCOUNTER — Encounter: Payer: Self-pay | Admitting: Nurse Practitioner

## 2018-05-02 DIAGNOSIS — M79642 Pain in left hand: Secondary | ICD-10-CM

## 2018-05-02 NOTE — Addendum Note (Signed)
Addended by: Precious Bard on: 05/02/2018 12:49 PM   Modules accepted: Orders
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
## 2018-05-03 ENCOUNTER — Encounter: Payer: Self-pay | Admitting: Nurse Practitioner

## 2018-05-03 ENCOUNTER — Other Ambulatory Visit (INDEPENDENT_AMBULATORY_CARE_PROVIDER_SITE_OTHER): Payer: Self-pay

## 2018-05-03 ENCOUNTER — Ambulatory Visit (INDEPENDENT_AMBULATORY_CARE_PROVIDER_SITE_OTHER): Payer: Medicare Other | Admitting: Orthopaedic Surgery

## 2018-05-03 ENCOUNTER — Encounter (INDEPENDENT_AMBULATORY_CARE_PROVIDER_SITE_OTHER): Payer: Self-pay | Admitting: Orthopaedic Surgery

## 2018-05-03 ENCOUNTER — Other Ambulatory Visit: Payer: Self-pay | Admitting: Nurse Practitioner

## 2018-05-03 DIAGNOSIS — M79642 Pain in left hand: Secondary | ICD-10-CM

## 2018-05-03 DIAGNOSIS — R2232 Localized swelling, mass and lump, left upper limb: Secondary | ICD-10-CM

## 2018-05-04 ENCOUNTER — Encounter: Payer: Self-pay | Admitting: Nurse Practitioner

## 2018-05-04 NOTE — Telephone Encounter (Signed)
Routed to Lauree Chandler, NP

## 2018-05-09 DIAGNOSIS — M79642 Pain in left hand: Secondary | ICD-10-CM | POA: Insufficient documentation

## 2018-05-30 ENCOUNTER — Other Ambulatory Visit: Payer: Self-pay | Admitting: Interventional Cardiology

## 2018-05-30 ENCOUNTER — Other Ambulatory Visit: Payer: Self-pay | Admitting: Physician Assistant

## 2018-06-07 ENCOUNTER — Telehealth: Payer: Medicare Other | Admitting: Family

## 2018-06-07 ENCOUNTER — Ambulatory Visit: Payer: Medicare Other | Admitting: Interventional Cardiology

## 2018-06-07 DIAGNOSIS — R300 Dysuria: Secondary | ICD-10-CM

## 2018-06-07 NOTE — Progress Notes (Signed)
We are sorry that you are not feeling well.  Here is how we plan to help!  Male bladder infections are not very common.  We worry about prostate or kidney conditions.  The standard of care is to examine the abdomen and kidneys, and to do a urine and blood test to make sure that something more serious is not going on.   NOTE: If you entered your credit card information for this eVisit, you will not be charged. You may see a "hold" on your card for the $35 but that hold will drop off and you will not have a charge processed.  We recommend that you see a provider today.  If your doctor's office is closed [place] has the following Urgent Cares:  If you need care fast and have a high deductible or no insurance consider:  [email or web address] to reserve your spot online an avoid wait times  [facility] [address] [place], [place] [address] Modified hours of operation: Monday-Friday, 10 AM to 6 PM  Saturday & Sunday 10 AM to 4 PM  [facility] (New Address!) [address], [place], [place] [address] *Just off [address], across the road from [place] hours of operation: Monday-Friday, 10 AM to 5 PM  Closed Saturday & Sunday  [facility]'s modified hours of operation will be in effect from Wednesday, April 1st through Thursday, April 30th.   The following sites will take your  insurance:  . [facility] [place] a Provider at this Location  [address] [place], [place] [address] . 10 am to 8 pm Monday-Friday . 12 pm to 8 pm Saturday-Sunday   . [facility] at [place] a Provider at this Location  [address] [place], [place], [place] [address] . 8 am to 8 pm Monday-Friday . 9 am to 6 pm Saturday . 11 am to 6 pm Sunday   . [facility] at [place] Get Driving Directions  [address] [place], [place] [address] . 8 am to 8 pm Monday-Friday . 8 am to 4 pm Saturday-Sunday   Your e-visit answers were reviewed by a board certified advanced clinical practitioner to complete your personal care plan.  Thank you for using e-Visits.   Greater than 5 minutes, yet less than 10 minutes of time have been spent researching, coordinating, and implementing care for this patient today.  Thank you for the details you included in the comment boxes. Those details are very helpful in determining the best course of treatment for you and help [place] to provide the best care.

## 2018-06-23 DIAGNOSIS — Z4789 Encounter for other orthopedic aftercare: Secondary | ICD-10-CM | POA: Insufficient documentation

## 2018-06-24 DIAGNOSIS — L089 Local infection of the skin and subcutaneous tissue, unspecified: Secondary | ICD-10-CM | POA: Insufficient documentation

## 2018-07-05 ENCOUNTER — Other Ambulatory Visit: Payer: Self-pay | Admitting: *Deleted

## 2018-07-05 MED ORDER — SERTRALINE HCL 50 MG PO TABS: 50.0000 mg | ORAL_TABLET | Freq: Every day | ORAL | 1 refills | Status: DC

## 2018-07-05 NOTE — Telephone Encounter (Signed)
Patient requested Rx to be sent to [facility].  

## 2018-07-06 ENCOUNTER — Telehealth: Payer: Self-pay | Admitting: Internal Medicine

## 2018-07-06 NOTE — Telephone Encounter (Signed)
COVID-19 Pre-Screening Questions: ° °Do you currently have a fever (>100 °F), chills or unexplained body aches? No  ° °Are you currently experiencing new cough, shortness of breath, sore throat, runny nose? No  °•  °Have you recently travelled outside the state of [place] in the last 14 days? No  °•  °Have you been in contact with someone that is currently pending confirmation of Covid19 testing or has been confirmed to have the Covid19 virus?  No  °

## 2018-07-11 ENCOUNTER — Ambulatory Visit (INDEPENDENT_AMBULATORY_CARE_PROVIDER_SITE_OTHER): Payer: Medicare Other | Admitting: Internal Medicine

## 2018-07-11 ENCOUNTER — Encounter: Payer: Self-pay | Admitting: Internal Medicine

## 2018-07-11 ENCOUNTER — Other Ambulatory Visit: Payer: Self-pay

## 2018-07-11 DIAGNOSIS — S61432A Puncture wound without foreign body of left hand, initial encounter: Secondary | ICD-10-CM

## 2018-07-11 DIAGNOSIS — L089 Local infection of the skin and subcutaneous tissue, unspecified: Secondary | ICD-10-CM | POA: Diagnosis not present

## 2018-07-11 MED ORDER — LEVOFLOXACIN 500 MG PO TABS: 500.0000 mg | ORAL_TABLET | Freq: Every day | ORAL | 0 refills | Status: DC
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
## 2018-07-24 ENCOUNTER — Other Ambulatory Visit: Payer: Self-pay

## 2018-07-24 ENCOUNTER — Encounter: Payer: Self-pay | Admitting: Internal Medicine

## 2018-07-24 ENCOUNTER — Ambulatory Visit (INDEPENDENT_AMBULATORY_CARE_PROVIDER_SITE_OTHER): Payer: Medicare Other | Admitting: Internal Medicine

## 2018-07-24 VITALS — BP 154/80 | HR 62 | Temp 98.1°F | Wt 205.0 lb

## 2018-07-24 DIAGNOSIS — S61432A Puncture wound without foreign body of left hand, initial encounter: Secondary | ICD-10-CM | POA: Diagnosis not present

## 2018-07-24 DIAGNOSIS — L089 Local infection of the skin and subcutaneous tissue, unspecified: Secondary | ICD-10-CM
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
## 2018-08-08 ENCOUNTER — Telehealth: Payer: Self-pay | Admitting: Interventional Cardiology

## 2018-08-08 NOTE — Telephone Encounter (Signed)

## 2018-08-09 ENCOUNTER — Other Ambulatory Visit: Payer: Self-pay

## 2018-08-09 ENCOUNTER — Ambulatory Visit (INDEPENDENT_AMBULATORY_CARE_PROVIDER_SITE_OTHER): Payer: Medicare Other | Admitting: Interventional Cardiology

## 2018-08-09 ENCOUNTER — Encounter: Payer: Self-pay | Admitting: Interventional Cardiology

## 2018-08-09 VITALS — BP 144/78 | HR 64 | Ht 67.0 in | Wt 203.8 lb

## 2018-08-09 DIAGNOSIS — G4730 Sleep apnea, unspecified: Secondary | ICD-10-CM

## 2018-08-09 DIAGNOSIS — I10 Essential (primary) hypertension: Secondary | ICD-10-CM

## 2018-08-09 DIAGNOSIS — I2510 Atherosclerotic heart disease of native coronary artery without angina pectoris: Secondary | ICD-10-CM | POA: Diagnosis not present

## 2018-08-09 DIAGNOSIS — Z7189 Other specified counseling: Secondary | ICD-10-CM

## 2018-08-09 DIAGNOSIS — E785 Hyperlipidemia, unspecified: Secondary | ICD-10-CM

## 2018-08-09 DIAGNOSIS — R7303 Prediabetes: Secondary | ICD-10-CM

## 2018-08-09 MED ORDER — RAMIPRIL 10 MG PO CAPS: 10.0000 mg | ORAL_CAPSULE | Freq: Every day | ORAL | 3 refills | Status: DC

## 2018-08-09 NOTE — Patient Instructions (Signed)
Medication Instructions:  1) CONTINUE to hold Amlodipine 2) INCREASE Ramipril to 10mg  once daily  If you need a refill on your cardiac medications before your next appointment, please call your pharmacy.   Lab work: Your physician recommends that you return for lab work in: 2 weeks (BMET)  If you have labs (blood work) drawn today and your tests are completely normal, you will receive your results only by: Marland Kitchen MyChart Message (if you have MyChart) OR . A paper copy in the mail If you have any lab test that is abnormal or we need to change your treatment, we will call you to review the results.  Testing/Procedures: None  Follow-Up: At [facility], you and your health needs are our priority.  As part of our continuing mission to provide you with exceptional heart care, we have created designated Provider Care Teams.  These Care Teams include your primary Cardiologist (physician) and Advanced Practice Providers (APPs -  Physician Assistants and Nurse Practitioners) who all work together to provide you with the care you need, when you need it. You will need a follow up appointment in 12 months.  Please call our office 2 months in advance to schedule this appointment.  You may see Dr. Tamala Julian or one of the following Advanced Practice Providers on your designated Care Team:   Truitt Merle, NP Cecilie Kicks, NP . Kathyrn Drown, NP  Any Other Special Instructions Will Be Listed Below (If Applicable).  Monitor your blood pressure daily for the next 7-10 days.  Check it 2-3 hours after your medications and then call us with those readings after the 7-10 days.

## 2018-08-09 NOTE — Progress Notes (Signed)
Cardiology Office Note:    Date:  08/09/2018   ID:  Tim Walters, DOB 11/29/1945, MRN [id number]  PCP:  Lauree Chandler, NP  Cardiologist:  No primary care provider on file.   Referring MD: Hulan Fess, MD   Chief Complaint  Patient presents with  . Coronary Artery Disease  . Hypertension    History of Present Illness:    Tim Walters is a 73 y.o. male with a hx of CAD (anterior MI s/p BMS to RCA 2003 with residual LAD/diagonal disease), HTN, HLD, depression, sleep apnea (not using CPAP), GERD, arthritis, MGUS, and pre-diabetes.   Is doing relatively well.  No cardiac complaints.  He is status post stenting in 2003.  He has had no significant recurrence of ischemic heart disease progression since that time.  He denies orthopnea, PND, and syncope.  When last seen amlodipine 5 mg/day was added.  He has noticed some lower extremity edema.  Past Medical History:  Diagnosis Date  . Arthritis    lt ankle  . Breast lump    Per records from [facility]   . Coronary artery disease    a. BMS to RCA 2003 with residual LAD/diag disease treated medically, normal EF.  Marland Kitchen Depression   . Diverticulosis    Per records from [facility]   . GERD (gastroesophageal reflux disease)   . Hyperkalemia    a. K of 5.2 in 2017.  Marland Kitchen Hyperlipidemia   . Hypertension   . Idiopathic peripheral neuropathy    Per records from [facility]   . Impaired fasting glucose    Per records from [facility]   . Monoclonal gammopathy of undetermined significance    Per records from [facility]   . Myocardial infarction ([place]) 11/15/2001   Dr. Daneen Schick [facility])  . OSA (obstructive sleep apnea)    Per [facility] New Patient Packet  . Peripheral neuropathy    Left Foot, Per [place] New Patient Packet  . Pre-diabetes   . Sleep apnea    had test several yr ago-said he did not need a cpap-still snores  . Torn rotator cuff    Per [place] Patient Packet  . Venous  insufficiency of left leg    Per [place] [facility] Patient Packet  . Wears glasses     Past Surgical History:  Procedure Laterality Date  . ANGIOPLASTY  2003   Per records from [facility]   . CARDIAC CATHETERIZATION  23003   stent rca  . CARDIOVASCULAR STRESS TEST  02/16/2016   Per records from [facility]   . COLONOSCOPY  06/17/2003   Per records from [facility], Dr.Ganem to be repeated 2015  . coronary artery stent  11/15/2001  . CORONARY STENT PLACEMENT  11/15/2001   Per records from [facility]   . CYST REMOVAL TRUNK Left 05/08/2013   Procedure: CYST REMOVAL BACK;  Surgeon: Adin Hector, MD;  Location: [place];  Service: General;  Laterality: Left;  . INGUINAL HERNIA REPAIR Left 03/24/2015   Procedure: OPEN REPAIR LEFT INGUINAL HERNIA ;  Surgeon: Fanny Skates, MD;  Location: [place];  Service: General;  Laterality: Left;  . INSERTION OF MESH Left 03/24/2015   Procedure: INSERTION OF MESH;  Surgeon: Fanny Skates, MD;  Location: [place];  Service: General;  Laterality: Left;  . SHOULDER ARTHROSCOPY W/ ROTATOR CUFF REPAIR  2011   right  . SPINE SURGERY  02/12/1993   L2,  L3 fragmented disc  . VASECTOMY  1987    Current Medications: Current Meds  Medication Sig  . amLODipine (NORVASC) 5 MG tablet TAKE 1 TABLET BY MOUTH  DAILY  . aspirin 81 MG tablet Take 1 tablet (81 mg total) by mouth daily.  Marland Kitchen atorvastatin (LIPITOR) 80 MG tablet Take 1 tablet (80 mg total) by mouth daily.  . carvedilol (COREG) 12.5 MG tablet TAKE 1 TABLET BY MOUTH TWO  TIMES DAILY  . Multiple Vitamin (MULTIVITAMIN WITH MINERALS) TABS tablet Take 1 tablet by mouth daily.  . Multiple Vitamins-Minerals (PRESERVISION AREDS PO) Take 1 capsule by mouth 2 (two) times daily.  Marland Kitchen OMEPRAZOLE PO Take 20 mg by mouth every morning.   . ramipril (ALTACE) 2.5 MG capsule TAKE 1 CAPSULE BY MOUTH  DAILY  . sertraline (ZOLOFT) 50 MG tablet Take 1 tablet (50  mg total) by mouth daily.     Allergies:   Patient has no known allergies.   Social History   Socioeconomic History  . Marital status: Married    Spouse name: Katharine Look  . Number of children: 7  . Years of education: 40  . Highest education level: Not on file  Occupational History  . Occupation: Retired  Scientific laboratory technician  . Financial resource strain: Not on file  . Food insecurity    Worry: Not on file    Inability: Not on file  . Transportation needs    Medical: Not on file    Non-medical: Not on file  Tobacco Use  . Smoking status: Former Smoker    Years: 12.00    Types: Cigarettes    Quit date: 02/15/1974    Years since quitting: 44.5  . Smokeless tobacco: Never Used  Substance and Sexual Activity  . Alcohol use: Yes    Alcohol/week: 14.0 standard drinks    Types: 14 Standard drinks or equivalent per week    Comment: 14-21 drinks weekly in the evening   . Drug use: No  . Sexual activity: Not on file  Lifestyle  . Physical activity    Days per week: Not on file    Minutes per session: Not on file  . Stress: Not on file  Relationships  . Social Herbalist on phone: Not on file    Gets together: Not on file    Attends religious service: Not on file    Active member of club or organization: Not on file    Attends meetings of clubs or organizations: Not on file    Relationship status: Not on file  Other Topics Concern  . Not on file  Social History Narrative   Lives with wife, Katharine Look   Caffeine use: Coffee daily   Caffeine free soda      As of 03/28/2018:   Diet: N/A      Caffeine: Yes      Married, if yes what year: Yes, 1971      Do you live in a house, apartment, assisted living, condo, trailer, ect: House, one stories, 2 persons      Pets: 1 cat      Current/Past profession: Dietitian, Chief Strategy Officer      Exercise: Yes, cardio strength 5 days weekly          Living Will: No   DNR: No, would like to discuss   POA/HPOA: Yes       Functional Status:   Do you have difficulty bathing or dressing yourself? No   Do  you have difficulty preparing food or eating? No   Do you have difficulty managing your medications? No   Do you have difficulty managing your finances? No   Do you have difficulty affording your medications? No     Family History: [name] family history includes Angina in his father; Autism in his son; COPD in his mother; Depression in his father; Diabetes type II in his daughter; Diverticulosis in his father; High Cholesterol in his sister; Hypertension in his father; Lung cancer in his mother; Macular degeneration in his maternal grandmother and mother; Non-Hodgkin's lymphoma in his mother; Post-traumatic stress disorder in his son; Suicidality (age of onset: 50) in his father. There is no history of Breast cancer.  ROS:   Please see the history of present illness.    He has a mycobacterial infection in his left hand that has been excised and drained.  May need to have further surgery.  All other systems reviewed and are negative.  EKGs/Labs/Other Studies Reviewed:    The following studies were reviewed today: No new functional studies  EKG:  EKG normal sinus rhythm, PACs, PVC, otherwise unremarkable.  When compared to the prior tracing from February 2019, no significant change other than the PVC activity is new.  Recent Labs: 04/05/2018: ALT 28; BUN 31; Creat 0.95; Hemoglobin 14.1; Platelets 174; Potassium 4.3; Sodium 137  Recent Lipid Panel    Component Value Date/Time   CHOL 130 04/05/2018 0934   TRIG 54 04/05/2018 0934   HDL 55 04/05/2018 0934   CHOLHDL 2.4 04/05/2018 0934   LDLCALC 62 04/05/2018 0934    Physical Exam:    VS:  BP (!) 144/78   Pulse 64   Ht 5\' 7"  (1.702 m)   Wt 203 lb 12.8 oz (92.4 kg)   SpO2 96%   BMI 31.92 kg/m     Wt Readings from Last 3 Encounters:  08/09/18 203 lb 12.8 oz (92.4 kg)  07/24/18 205 lb (93 kg)  07/11/18 206 lb (93.4 kg)     GEN: Obese. No  acute distress HEENT: Normal NECK: No JVD. LYMPHATICS: No lymphadenopathy CARDIAC: RRR.  No murmur, no gallop, no edema VASCULAR: 2+ radial pulses, no bruits RESPIRATORY:  Clear to auscultation without rales, wheezing or rhonchi  ABDOMEN: Soft, non-tender, non-distended, No pulsatile mass, MUSCULOSKELETAL: No deformity  SKIN: Warm and dry NEUROLOGIC:  Alert and oriented x 3 PSYCHIATRIC:  Normal affect   ASSESSMENT:    1. CAD in native artery   2. Essential hypertension   3. Hyperlipidemia, unspecified hyperlipidemia type   4. Sleep apnea, unspecified type   5. Educated About Covid-19 Virus Infection   6. Prediabetes    PLAN:    In order of problems listed above:  1. Stable.  We discussed secondary prevention.  This is outlined below. 2. Since he is having edema on amlodipine we will increase Altace to 10 mg/day and stop amlodipine.  Monitor blood pressure for 7 to 10 days.  Bmet in 2 weeks.  Target blood pressure 130/80 mmHg.  Low-dose diuretic may be a better choice if his pressure remains too high.  Low-salt diet and weight loss also encouraged.  He has sleep apnea but will not use CPAP. 3. Target LDL less than 70 was achieved in January. 4. Not being treated.  Chronic sleep deprivation causing fatigue. 5. Social distancing, masking, and handwashing. 6. Last A1c was 5.6. Overall education and awareness concerning primary/secondary risk prevention was discussed in detail: LDL less than 70,  hemoglobin A1c less than 7, blood pressure target less than 130/80 mmHg, >150 minutes of moderate aerobic activity per week, avoidance of smoking, weight control (via diet and exercise), and continued surveillance/management of/for obstructive sleep apnea.    Medication Adjustments/Labs and Tests Ordered: Current medicines are reviewed at length with the patient today.  Concerns regarding medicines are outlined above.  No orders of the defined types were placed in this encounter.  No orders  of the defined types were placed in this encounter.   There are no Patient Instructions on file for this visit.   Signed, Sinclair Grooms, MD  08/09/2018 3:03 PM    [facility]

## 2018-08-21 ENCOUNTER — Ambulatory Visit: Payer: Medicare Other | Admitting: Internal Medicine

## 2018-08-24 ENCOUNTER — Other Ambulatory Visit: Payer: Self-pay

## 2018-08-24 ENCOUNTER — Other Ambulatory Visit: Payer: Medicare Other | Admitting: *Deleted

## 2018-08-24 DIAGNOSIS — I10 Essential (primary) hypertension: Secondary | ICD-10-CM

## 2018-08-24 DIAGNOSIS — I2510 Atherosclerotic heart disease of native coronary artery without angina pectoris: Secondary | ICD-10-CM

## 2018-08-24 LAB — BASIC METABOLIC PANEL
BUN/Creatinine Ratio: 24 (ref 10–24)
BUN: 21 mg/dL (ref 8–27)
CO2: 22 mmol/L (ref 20–29)
Calcium: 9.1 mg/dL (ref 8.6–10.2)
Chloride: 104 mmol/L (ref 96–106)
Creatinine, Ser: 0.88 mg/dL (ref 0.76–1.27)
GFR calc Af Amer: 98 mL/min/{1.73_m2} (ref >59)
GFR calc non Af Amer: 85 mL/min/{1.73_m2} (ref >59)
Glucose: 123 mg/dL — ABNORMAL HIGH (ref 65–99)
Potassium: 4.4 mmol/L (ref 3.5–5.2)
Sodium: 138 mmol/L (ref 134–144)

## 2018-09-05 ENCOUNTER — Other Ambulatory Visit: Payer: Self-pay | Admitting: Internal Medicine

## 2018-09-16 HISTORY — PX: VEIN REPAIR: SHX6524

## 2018-09-29 ENCOUNTER — Ambulatory Visit: Payer: Medicare Other | Admitting: Interventional Cardiology

## 2018-10-09 ENCOUNTER — Encounter: Payer: Self-pay | Admitting: Nurse Practitioner

## 2018-10-16 ENCOUNTER — Other Ambulatory Visit: Payer: Self-pay

## 2018-10-16 ENCOUNTER — Other Ambulatory Visit: Payer: Medicare Other

## 2018-10-16 DIAGNOSIS — R739 Hyperglycemia, unspecified: Secondary | ICD-10-CM

## 2018-10-16 DIAGNOSIS — I2510 Atherosclerotic heart disease of native coronary artery without angina pectoris: Secondary | ICD-10-CM

## 2018-10-16 DIAGNOSIS — E785 Hyperlipidemia, unspecified: Secondary | ICD-10-CM

## 2018-10-17 LAB — CBC WITH DIFFERENTIAL/PLATELET
Absolute Monocytes: 684 cells/uL (ref 200–950)
Basophils Absolute: 60 cells/uL (ref 0–200)
Basophils Relative: 1 %
Eosinophils Absolute: 282 cells/uL (ref 15–500)
Eosinophils Relative: 4.7 %
HCT: 42.3 % (ref 38.5–50.0)
Hemoglobin: 14 g/dL (ref 13.2–17.1)
Lymphs Abs: 1218 cells/uL (ref 850–3900)
MCH: 31.5 pg (ref 27.0–33.0)
MCHC: 33.1 g/dL (ref 32.0–36.0)
MCV: 95.3 fL (ref 80.0–100.0)
MPV: 10.3 fL (ref 7.5–12.5)
Monocytes Relative: 11.4 %
Neutro Abs: 3756 cells/uL (ref 1500–7800)
Neutrophils Relative %: 62.6 %
Platelets: 176 10*3/uL (ref 140–400)
RBC: 4.44 10*6/uL (ref 4.20–5.80)
RDW: 12.7 % (ref 11.0–15.0)
Total Lymphocyte: 20.3 %
WBC: 6 10*3/uL (ref 3.8–10.8)

## 2018-10-17 LAB — COMPLETE METABOLIC PANEL WITH GFR
AG Ratio: 1.6 (calc) (ref 1.0–2.5)
ALT: 23 U/L (ref 9–46)
AST: 23 U/L (ref 10–35)
Albumin: 3.9 g/dL (ref 3.6–5.1)
Alkaline phosphatase (APISO): 62 U/L (ref 35–144)
BUN/Creatinine Ratio: 37 (calc) — ABNORMAL HIGH (ref 6–22)
BUN: 28 mg/dL — ABNORMAL HIGH (ref 7–25)
CO2: 27 mmol/L (ref 20–32)
Calcium: 8.9 mg/dL (ref 8.6–10.3)
Chloride: 107 mmol/L (ref 98–110)
Creat: 0.75 mg/dL (ref 0.70–1.18)
GFR, Est African American: 105 mL/min/{1.73_m2} (ref >=60)
GFR, Est Non African American: 90 mL/min/{1.73_m2} (ref >=60)
Globulin: 2.5 g/dL (calc) (ref 1.9–3.7)
Glucose, Bld: 106 mg/dL — ABNORMAL HIGH (ref 65–99)
Potassium: 4.5 mmol/L (ref 3.5–5.3)
Sodium: 139 mmol/L (ref 135–146)
Total Bilirubin: 0.7 mg/dL (ref 0.2–1.2)
Total Protein: 6.4 g/dL (ref 6.1–8.1)

## 2018-10-17 LAB — LIPID PANEL
Cholesterol: 129 mg/dL (ref <200)
HDL: 64 mg/dL (ref >=40)
LDL Cholesterol (Calc): 54 mg/dL (calc)
Non-HDL Cholesterol (Calc): 65 mg/dL (calc) (ref <130)
Total CHOL/HDL Ratio: 2 (calc) (ref <5.0)
Triglycerides: 40 mg/dL (ref <150)

## 2018-10-17 LAB — HEMOGLOBIN A1C
Hgb A1c MFr Bld: 5.8 % of total Hgb — ABNORMAL HIGH (ref <5.7)
Mean Plasma Glucose: 120 (calc)
eAG (mmol/L): 6.6 (calc)

## 2018-10-19 ENCOUNTER — Ambulatory Visit: Payer: Medicare Other | Admitting: Nurse Practitioner

## 2018-10-24 ENCOUNTER — Other Ambulatory Visit: Payer: Medicare Other

## 2018-10-27 ENCOUNTER — Ambulatory Visit: Payer: Medicare Other | Admitting: Nurse Practitioner

## 2018-11-06 ENCOUNTER — Other Ambulatory Visit: Payer: Self-pay

## 2018-11-06 ENCOUNTER — Ambulatory Visit (INDEPENDENT_AMBULATORY_CARE_PROVIDER_SITE_OTHER): Payer: Medicare Other | Admitting: Nurse Practitioner

## 2018-11-06 ENCOUNTER — Encounter: Payer: Self-pay | Admitting: Nurse Practitioner

## 2018-11-06 VITALS — BP 128/72 | HR 62 | Temp 97.7°F | Resp 10 | Ht 67.0 in | Wt 204.0 lb

## 2018-11-06 DIAGNOSIS — Z23 Encounter for immunization: Secondary | ICD-10-CM | POA: Diagnosis not present

## 2018-11-06 DIAGNOSIS — I10 Essential (primary) hypertension: Secondary | ICD-10-CM

## 2018-11-06 DIAGNOSIS — I2510 Atherosclerotic heart disease of native coronary artery without angina pectoris: Secondary | ICD-10-CM

## 2018-11-06 DIAGNOSIS — F325 Major depressive disorder, single episode, in full remission: Secondary | ICD-10-CM | POA: Diagnosis not present

## 2018-11-06 DIAGNOSIS — E785 Hyperlipidemia, unspecified: Secondary | ICD-10-CM

## 2018-11-06 DIAGNOSIS — M1610 Unilateral primary osteoarthritis, unspecified hip: Secondary | ICD-10-CM

## 2018-11-06 DIAGNOSIS — R2232 Localized swelling, mass and lump, left upper limb: Secondary | ICD-10-CM | POA: Diagnosis not present

## 2018-11-06 DIAGNOSIS — R739 Hyperglycemia, unspecified: Secondary | ICD-10-CM

## 2018-11-06 NOTE — Patient Instructions (Signed)
Follow up in 6 months with labs prior to appt

## 2018-11-06 NOTE — Progress Notes (Signed)
Careteam: Patient Care Team: Lauree Chandler, NP as PCP - General (Geriatric Medicine) Fanny Skates, MD as Consulting Physician (General Surgery) Marchia Bond, MD as Consulting Physician (Orthopedic Surgery) Jarome Matin, MD as Consulting Physician (Dermatology) Belva Crome, MD as Consulting Physician (Cardiology) Dyke Maes, OD as Referring Physician (Optometry) Kathrynn Ducking, MD as Consulting Physician (Neurology)  Advanced Directive information Does Patient Have a Medical Advance Directive?: No, Does patient want to make changes to medical advance directive?: Yes (MAU/Ambulatory/Procedural Areas - Information given)(In process)  No Known Allergies  Chief Complaint  Patient presents with  . Medical Management of Chronic Issues    6 month follow-up   . Immunizations    Flu vaccine today   . Hip Pain    Right hip pain- arthritis flare up      HPI: Patient is a 73 y.o. male seen in the office today for routine follow up.  Hand still with issues-none today, he was diagnosised with mycobacteria went on another antibiotic and had an I & D from ortho and then sent to ID-  will have pain occasionally if he bumps it or using his hand excessively. Ortho said the only way to completely clear it out was to go to surgery but pt does not wish to do that at this time.   Hyperlipidemia- taking lipitor 80 mg daily;  LDL at goal. Continue dietary modification. Walking 4-5 miles a day  htn- controlled on coreg and ramipril  CAD- continues on asa, no chest pain, following with cardiology  Plans to get flu shot today  Anxiety/depression- no anxiety or depression, in remission, continues on zoloft 50 mg daily.   Hip pain- has resolved with exercises and aleve 220 mg by mouth twice daily for 4 days.   GERD- stable on omeprazole.   Review of Systems:  Review of Systems  Constitutional: Negative for chills, fever and weight loss.  HENT: Negative for tinnitus.    Respiratory: Negative for cough, sputum production and shortness of breath.   Cardiovascular: Negative for chest pain, palpitations and leg swelling.  Gastrointestinal: Negative for abdominal pain, constipation, diarrhea and heartburn.  Genitourinary: Positive for frequency. Negative for dysuria and urgency.       No changes in urinary frequency.   Musculoskeletal: Positive for joint pain. Negative for back pain, falls and myalgias.       Pain in right hip which has flared before- now pretty just resolved  Skin: Negative.   Neurological: Positive for tingling (neuropathy in left foot). Negative for dizziness and headaches.  Psychiatric/Behavioral: Negative for depression and memory loss. The patient does not have insomnia.     Past Medical History:  Diagnosis Date  . Arthritis    lt ankle  . Breast lump    Per records from [facility]   . Coronary artery disease    a. BMS to RCA 2003 with residual LAD/diag disease treated medically, normal EF.  Marland Kitchen Depression   . Diverticulosis    Per records from [facility]   . GERD (gastroesophageal reflux disease)   . Hyperkalemia    a. K of 5.2 in 2017.  Marland Kitchen Hyperlipidemia   . Hypertension   . Idiopathic peripheral neuropathy    Per records from [facility]   . Impaired fasting glucose    Per records from [facility]   . Monoclonal gammopathy of undetermined significance    Per records from [facility]   . Myocardial infarction ([place]) 11/15/2001  Dr. Daneen Schick [facility])  . OSA (obstructive sleep apnea)    Per [facility] New Patient Packet  . Peripheral neuropathy    Left Foot, Per [place] New Patient Packet  . Pre-diabetes   . Sleep apnea    had test several yr ago-said he did not need a cpap-still snores  . Torn rotator cuff    Per [place] Patient Packet  . Venous insufficiency of left leg    Per [place] Patient Packet  . Wears glasses    Past Surgical History:  Procedure Laterality Date  . ANGIOPLASTY   2003   Per records from [facility]   . CARDIAC CATHETERIZATION  23003   stent rca  . CARDIOVASCULAR STRESS TEST  02/16/2016   Per records from [facility]   . COLONOSCOPY  06/17/2003   Per records from [facility], Dr.Ganem to be repeated 2015  . coronary artery stent  11/15/2001  . CORONARY STENT PLACEMENT  11/15/2001   Per records from [facility]   . CYST REMOVAL TRUNK Left 05/08/2013   Procedure: CYST REMOVAL BACK;  Surgeon: Adin Hector, MD;  Location: [place];  Service: General;  Laterality: Left;  . INGUINAL HERNIA REPAIR Left 03/24/2015   Procedure: OPEN REPAIR LEFT INGUINAL HERNIA ;  Surgeon: Fanny Skates, MD;  Location: [place];  Service: General;  Laterality: Left;  . INSERTION OF MESH Left 03/24/2015   Procedure: INSERTION OF MESH;  Surgeon: Fanny Skates, MD;  Location: [place];  Service: General;  Laterality: Left;  . SHOULDER ARTHROSCOPY W/ ROTATOR CUFF REPAIR  2011   right  . SPINE SURGERY  02/12/1993   L2, L3 fragmented disc  . VASECTOMY  1987  . VEIN REPAIR  09/2018   Vein injections    Social History:   reports that he quit smoking about 44 years ago. His smoking use included cigarettes. He quit after 12.00 years of use. He has never used smokeless tobacco. He reports current alcohol use of about 14.0 standard drinks of alcohol per week. He reports that he does not use drugs.  Family History  Problem Relation Age of Onset  . Macular degeneration Mother   . COPD Mother   . Non-Hodgkin's lymphoma Mother   . Lung cancer Mother        Per [facility] New Patient Packet   . Hypertension Father   . Suicidality Father 53       Per [facility] New Patient Packet   . Depression Father   . Angina Father        Per records from [facility]   . Diverticulosis Father        Per records from [facility]   . High Cholesterol Sister   . Macular degeneration Maternal Grandmother   . Autism Son   .  Post-traumatic stress disorder Son   . Diabetes type II Daughter   . Breast cancer Neg Hx     Medications: Patient's Medications  New Prescriptions   No medications on file  Previous Medications   ASPIRIN 81 MG TABLET    Take 1 tablet (81 mg total) by mouth daily.   ATORVASTATIN (LIPITOR) 80 MG TABLET    TAKE 1 TABLET BY MOUTH  DAILY   CARVEDILOL (COREG) 12.5 MG TABLET    TAKE 1 TABLET BY MOUTH TWO  TIMES DAILY   MULTIPLE VITAMIN (MULTIVITAMIN WITH MINERALS) TABS TABLET    Take 1 tablet by mouth  daily.   MULTIPLE VITAMINS-MINERALS (PRESERVISION AREDS PO)    Take 1 capsule by mouth 2 (two) times daily.   NAPROXEN SODIUM (ALEVE) 220 MG TABLET    Take 220 mg by mouth as needed.   OMEPRAZOLE PO    Take 20 mg by mouth every morning.    RAMIPRIL (ALTACE) 10 MG CAPSULE    Take 1 capsule (10 mg total) by mouth daily.   SERTRALINE (ZOLOFT) 50 MG TABLET    Take 1 tablet (50 mg total) by mouth daily.  Modified Medications   No medications on file  Discontinued Medications   AMLODIPINE (NORVASC) 5 MG TABLET    TAKE 1 TABLET BY MOUTH  DAILY    Physical Exam:  Vitals:   11/06/18 1314  BP: 128/72  Pulse: 72  Resp: 10  Temp: 97.7 F (36.5 C)  TempSrc: Temporal  Weight: 204 lb (92.5 kg)  Height: '5\' 7"'  (1.702 m)   Body mass index is 31.95 kg/m. Wt Readings from Last 3 Encounters:  11/06/18 204 lb (92.5 kg)  08/09/18 203 lb 12.8 oz (92.4 kg)  07/24/18 205 lb (93 kg)    Physical Exam Exam conducted with a chaperone present.  Constitutional:      General: He is not in acute distress.    Appearance: He is well-developed. He is not diaphoretic.  HENT:     Head: Normocephalic and atraumatic.     Right Ear: Tympanic membrane, ear canal and external ear normal.     Left Ear: Tympanic membrane, ear canal and external ear normal.     Nose: Nose normal.     Mouth/Throat:     Mouth: Mucous membranes are moist.     Pharynx: Oropharynx is clear. No oropharyngeal exudate.  Eyes:      Conjunctiva/sclera: Conjunctivae normal.     Pupils: Pupils are equal, round, and reactive to light.  Neck:     Musculoskeletal: Normal range of motion and neck supple.  Cardiovascular:     Rate and Rhythm: Normal rate and regular rhythm.     Heart sounds: Normal heart sounds.  Pulmonary:     Effort: Pulmonary effort is normal.     Breath sounds: Normal breath sounds.  Abdominal:     General: Bowel sounds are normal.     Palpations: Abdomen is soft.  Musculoskeletal: Normal range of motion.     Left hand: He exhibits tenderness. He exhibits normal range of motion. Normal sensation noted. Normal strength noted.       Hands:     Right lower leg: No edema.     Left lower leg: No edema.     Comments: No open area, 2 raised ares which are mildly tender but no erythema or warmth noted  Skin:    General: Skin is warm and dry.  Neurological:     Mental Status: He is alert and oriented to person, place, and time.  Psychiatric:        Mood and Affect: Mood normal.        Behavior: Behavior normal.     Labs reviewed: Basic Metabolic Panel: Recent Labs    04/05/18 0934 08/24/18 1000 10/16/18 0836  NA 137 138 139  K 4.3 4.4 4.5  CL 105 104 107  CO2 '26 22 27  ' GLUCOSE 127* 123* 106*  BUN 31* 21 28*  CREATININE 0.95 0.88 0.75  CALCIUM 9.0 9.1 8.9   Liver Function Tests: Recent Labs    04/05/18 0934 10/16/18 0836  AST 22 23  ALT 28 23  BILITOT 0.5 0.7  PROT 6.3 6.4   No results for input(s): LIPASE, AMYLASE in the last 8760 hours. No results for input(s): AMMONIA in the last 8760 hours. CBC: Recent Labs    04/05/18 0934 10/16/18 0836  WBC 5.8 6.0  NEUTROABS 3,637 3,756  HGB 14.1 14.0  HCT 41.5 42.3  MCV 92.8 95.3  PLT 174 176   Lipid Panel: Recent Labs    04/05/18 0934 10/16/18 0836  CHOL 130 129  HDL 55 64  LDLCALC 62 54  TRIG 54 40  CHOLHDL 2.4 2.0   TSH: No results for input(s): TSH in the last 8760 hours. A1C: Lab Results  Component Value Date    HGBA1C 5.8 (H) 10/16/2018     Assessment/Plan 1. Need for influenza vaccination - Flu Vaccine QUAD High Dose(Fluad)  2. Essential hypertension, benign -stable on coreg ad ramipril. Following with cardiologist at this time. - CBC with Differential/Platelet; Future - CMP with eGFR([facility]); Future  3. Localized swelling on left hand Has followed with orthopedic with I&D and infectious disease for extended antibiotics (which were not beneficial per pt) He was offered surgery but declined at this time because he is not having that much issue with it.  4. Major depressive disorder in full remission, unspecified whether recurrent (HCC) Stable on zoloft 50 mg  5. Hyperlipidemia, unspecified hyperlipidemia type -stable on lipitor with exercise modifications.  - CMP with eGFR([facility]); Future - Lipid Panel; Future  6. Hyperglycemia -a1c 5.8. continues to work on dietary modifications at this time - Hemoglobin A1c; Future  7. Atherosclerosis of native coronary artery of native heart without angina pectoris Stable, close follow up with cardiologist, continues on ASA mg daily with lipitor, coreg and ramipril  8. OA of hip -stable did exercise/streches to hip with aleve for 4 days with benefit.   Next appt: 6 months with lab work before.  Carlos American. [place], [place] [facility] [phone]

## 2018-11-29 ENCOUNTER — Other Ambulatory Visit: Payer: Self-pay | Admitting: Nurse Practitioner

## 2018-11-29 ENCOUNTER — Other Ambulatory Visit: Payer: Self-pay | Admitting: Physician Assistant

## 2019-02-24 ENCOUNTER — Ambulatory Visit: Payer: Medicare Other | Attending: Internal Medicine

## 2019-02-24 DIAGNOSIS — Z23 Encounter for immunization: Secondary | ICD-10-CM | POA: Insufficient documentation

## 2019-02-24 NOTE — Progress Notes (Signed)
   Covid-19 Vaccination Clinic  Name:  Tim Walters    MRN: [id number] DOB: [birth year]  02/24/2019  Tim Walters was observed post Covid-19 immunization for 30 minutes based on pre-vaccination screening without incidence. He was provided with Vaccine Information Sheet and instruction to access the V-Safe system.   Tim Walters was instructed to call 911 with any severe reactions post vaccine: Marland Kitchen Difficulty breathing  . Swelling of your face and throat  . A fast heartbeat  . A bad rash all over your body  . Dizziness and weakness    Immunizations Administered    Name Date Dose VIS Date Route   Pfizer COVID-19 Vaccine 02/24/2019 12:40 PM 0.3 mL 01/26/2019 Intramuscular   Manufacturer: [place]   Lot: [id number]   [place]: [id number]

## 2019-03-15 ENCOUNTER — Ambulatory Visit: Payer: Medicare Other

## 2019-03-17 ENCOUNTER — Ambulatory Visit: Payer: Medicare Other | Attending: Internal Medicine

## 2019-03-17 DIAGNOSIS — Z23 Encounter for immunization: Secondary | ICD-10-CM

## 2019-03-17 NOTE — Progress Notes (Signed)
   Covid-19 Vaccination Clinic  Name:  Jerode Moscatello    MRN: [id number] DOB: 1945/10/15  03/17/2019  Mr. Kravets was observed post Covid-19 immunization for 15 minutes without incidence. He was provided with Vaccine Information Sheet and instruction to access the V-Safe system.   Mr. Arther was instructed to call 911 with any severe reactions post vaccine: Marland Kitchen Difficulty breathing  . Swelling of your face and throat  . A fast heartbeat  . A bad rash all over your body  . Dizziness and weakness    Immunizations Administered    Name Date Dose VIS Date Route   Pfizer COVID-19 Vaccine 03/17/2019 10:14 AM 0.3 mL 01/26/2019 Intramuscular   Manufacturer: [place]   Lot: [id number]   [place]: [id number]

## 2019-04-04 ENCOUNTER — Other Ambulatory Visit: Payer: Self-pay | Admitting: Nurse Practitioner

## 2019-04-20 ENCOUNTER — Encounter: Payer: Self-pay | Admitting: Nurse Practitioner

## 2019-04-23 NOTE — Telephone Encounter (Signed)
Message routed to [place], Jessica K, NP  

## 2019-04-30 ENCOUNTER — Other Ambulatory Visit: Payer: Self-pay

## 2019-04-30 ENCOUNTER — Encounter: Payer: Self-pay | Admitting: Nurse Practitioner

## 2019-04-30 ENCOUNTER — Ambulatory Visit (INDEPENDENT_AMBULATORY_CARE_PROVIDER_SITE_OTHER): Payer: Medicare Other | Admitting: Nurse Practitioner

## 2019-04-30 DIAGNOSIS — Z1159 Encounter for screening for other viral diseases: Secondary | ICD-10-CM | POA: Diagnosis not present

## 2019-04-30 DIAGNOSIS — Z0000 Encounter for general adult medical examination without abnormal findings: Secondary | ICD-10-CM | POA: Diagnosis not present

## 2019-04-30 NOTE — Progress Notes (Signed)
Subjective:   Tim Walters is a 74 y.o. male who presents for Medicare Annual/Subsequent preventive examination.  Review of Systems:   Cardiac Risk Factors include: obesity (BMI >30kg/m2);hypertension;dyslipidemia;advanced age (>69men, >28 women)     Objective:    Vitals: There were no vitals taken for this visit.  There is no height or weight on file to calculate BMI.  Advanced Directives 04/30/2019 11/06/2018 03/28/2018 06/03/2016 03/24/2015 03/04/2015 05/03/2013  Does Patient Have a Medical Advance Directive? No No No Yes No Yes Patient has advance directive, copy not in chart  Type of Advance Directive - - - [place];Living will - [place]  Does patient want to make changes to medical advance directive? - Yes (MAU/Ambulatory/Procedural Areas - Information given) - - - - No change requested  Copy of [place] in Chart? - - - No - copy requested - - -  Would patient like information on creating a medical advance directive? Yes (MAU/Ambulatory/Procedural Areas - Information given) - Yes (MAU/Ambulatory/Procedural Areas - Information given) - - - -    Tobacco Social History   Tobacco Use  Smoking Status Former Smoker  . Years: 12.00  . Types: Cigarettes  . Quit date: 02/15/1974  . Years since quitting: 45.2  Smokeless Tobacco Never Used     Counseling given: Not Answered   Clinical Intake:  Pre-visit preparation completed: Yes  Pain : No/denies pain     BMI - recorded: 31.95 Nutritional Status: BMI > 30  Obese Nutritional Risks: None Diabetes: No  How often do you need to have someone help you when you read instructions, pamphlets, or other written materials from your doctor or pharmacy?: 1 - Never What is the last grade level you completed in school?: college degree        Past Medical History:  Diagnosis Date  . Arthritis    lt ankle  . Breast lump    Per  records from [facility]   . Coronary artery disease    a. BMS to RCA 2003 with residual LAD/diag disease treated medically, normal EF.  Marland Kitchen Depression   . Diverticulosis    Per records from [facility]   . GERD (gastroesophageal reflux disease)   . Hyperkalemia    a. K of 5.2 in 2017.  Marland Kitchen Hyperlipidemia   . Hypertension   . Idiopathic peripheral neuropathy    Per records from [facility]   . Impaired fasting glucose    Per records from [facility]   . Monoclonal gammopathy of undetermined significance    Per [facility] from [facility]   . Myocardial infarction ([place]) 11/15/2001   Dr. Daneen Schick [facility])  . OSA (obstructive sleep apnea)    Per [facility] New Patient Packet  . Peripheral neuropathy    Left Foot, Per [place] New Patient Packet  . Pre-diabetes   . Sleep apnea    had test several yr ago-said he did not need a cpap-still snores  . Torn rotator cuff    Per [place] Patient Packet  . Venous insufficiency of left leg    Per [place] Patient Packet  . Wears glasses    Past Surgical History:  Procedure Laterality Date  . ANGIOPLASTY  2003   Per records from [facility]   . CARDIAC CATHETERIZATION  23003   stent rca  . CARDIOVASCULAR STRESS TEST  02/16/2016   Per records from [facility]   .  COLONOSCOPY  06/17/2003   Per records [facility], Dr.Ganem to be repeated 2015  . coronary artery stent  11/15/2001  . CORONARY STENT PLACEMENT  11/15/2001   Per records from [facility]   . CYST REMOVAL TRUNK Left 05/08/2013   Procedure: CYST REMOVAL BACK;  Surgeon: Adin Hector, MD;  Location: [place];  Service: General;  Laterality: Left;  . INGUINAL HERNIA REPAIR Left 03/24/2015   Procedure: OPEN REPAIR LEFT INGUINAL HERNIA ;  Surgeon: Fanny Skates, MD;  Location: [place];  Service: General;  Laterality: Left;  . INSERTION OF MESH Left 03/24/2015   Procedure: INSERTION OF MESH;  Surgeon:  Fanny Skates, MD;  Location: [place];  Service: General;  Laterality: Left;  . SHOULDER ARTHROSCOPY W/ ROTATOR CUFF REPAIR  2011   right  . SPINE SURGERY  02/12/1993   L2, L3 fragmented disc  . VASECTOMY  1987  . VEIN REPAIR  09/2018   Vein injections    Family History  Problem Relation Age of Onset  . Macular degeneration Mother   . COPD Mother   . Non-Hodgkin's lymphoma Mother   . Lung cancer Mother        Per [facility] New Patient Packet   . Hypertension Father   . Suicidality Father 69       Per [place] New Patient Packet   . Depression Father   . Angina Father        Per records from [facility]   . Diverticulosis Father        Per records from [facility]   . High Cholesterol Sister   . Macular degeneration Maternal Grandmother   . Autism Son   . Post-traumatic stress disorder Son   . Diabetes type II Daughter   . Breast cancer Neg Hx    Social History   Socioeconomic History  . Marital status: Married    Spouse name: Katharine Look  . Number of children: 7  . Years of education: 61  . Highest education level: Not on file  Occupational History  . Occupation: Retired  Tobacco Use  . Smoking status: Former Smoker    Years: 12.00    Types: Cigarettes    Quit date: 02/15/1974    Years since quitting: 45.2  . Smokeless tobacco: Never Used  Substance and Sexual Activity  . Alcohol use: Yes    Alcohol/week: 14.0 standard drinks    Types: 14 Standard drinks or equivalent per week    Comment: 14-21 drinks weekly in the evening   . Drug use: No  . Sexual activity: Not on file  Other Topics Concern  . Not on file  Social History Narrative   Lives with wife, Katharine Look   Caffeine use: Coffee daily   Caffeine free soda      As of 03/28/2018:   Diet: N/A      Caffeine: Yes      Married, if yes what year: Yes, 1971      Do you live in a house, apartment, assisted living, condo, trailer, ect: House, one stories, 2 persons      Pets: 1 cat       Current/Past profession: Dietitian, Chief Strategy Officer      Exercise: Yes, cardio strength 5 days weekly          Living Will: No   DNR: No, would like to discuss   POA/HPOA: Yes      Functional Status:  Do you have difficulty bathing or dressing yourself? No   Do you have difficulty preparing food or eating? No   Do you have difficulty managing your medications? No   Do you have difficulty managing your finances? No   Do you have difficulty affording your medications? No   Social Determinants of Health   Financial Resource Strain:   . Difficulty of Paying Living Expenses:   Food Insecurity:   . Worried About Charity fundraiser in the Last Year:   . Arboriculturist in the Last Year:   Transportation Needs:   . Film/video editor (Medical):   Marland Kitchen Lack of Transportation (Non-Medical):   Physical Activity:   . Days of Exercise per Week:   . Minutes of Exercise per Session:   Stress:   . Feeling of Stress :   Social Connections:   . Frequency of Communication with Friends and Family:   . Frequency of Social Gatherings with Friends and Family:   . Attends Religious Services:   . Active Member of Clubs or Organizations:   . Attends Archivist Meetings:   Marland Kitchen Marital Status:     Outpatient Encounter Medications as of 04/30/2019  Medication Sig  . aspirin 81 MG tablet Take 1 tablet (81 mg total) by mouth daily.  Marland Kitchen atorvastatin (LIPITOR) 80 MG tablet TAKE 1 TABLET BY MOUTH  DAILY  . carvedilol (COREG) 12.5 MG tablet TAKE 1 TABLET BY MOUTH  TWICE DAILY  . Multiple Vitamin (MULTIVITAMIN WITH MINERALS) TABS tablet Take 1 tablet by mouth daily.  . naproxen sodium (ALEVE) 220 MG tablet Take 220 mg by mouth as needed.  Marland Kitchen OMEPRAZOLE PO Take 20 mg by mouth every morning.   . ramipril (ALTACE) 10 MG capsule Take 1 capsule (10 mg total) by mouth daily.  . sertraline (ZOLOFT) 50 MG tablet TAKE 1 TABLET BY MOUTH  DAILY  . [DISCONTINUED] Multiple Vitamins-Minerals (PRESERVISION  AREDS PO) Take 1 capsule by mouth 2 (two) times daily.   No facility-administered encounter medications on file as of 04/30/2019.    Activities of Daily Living In your present state of health, do you have any difficulty performing the following activities: 04/30/2019  Hearing? N  Vision? N  Difficulty concentrating or making decisions? N  Walking or climbing stairs? N  Dressing or bathing? N  Doing errands, shopping? N  Preparing Food and eating ? N  Using the Toilet? N  In the past six months, have you accidently leaked urine? Y  Do you have problems with loss of bowel control? N  Managing your Medications? N  Managing your Finances? N  Housekeeping or managing your Housekeeping? N  Some recent data might be hidden    Patient Care Team: Lauree Chandler, NP as PCP - General (Geriatric Medicine) Fanny Skates, MD as Consulting Physician (General Surgery) Marchia Bond, MD as Consulting Physician (Orthopedic Surgery) Jarome Matin, MD as Consulting Physician (Dermatology) Belva Crome, MD as Consulting Physician (Cardiology) Dyke Maes, OD as Referring Physician (Optometry) Kathrynn Ducking, MD as Consulting Physician (Neurology)   Assessment:   This is a routine wellness examination for [place].  Exercise Activities and Dietary recommendations Current Exercise Habits: Home exercise routine, Type of exercise: walking;strength training/weights, Time (Minutes): > 60(90 mins), Frequency (Times/Week): 7, Weekly Exercise (Minutes/Week): 0, Intensity: Mild  Goals    . Weight (lb) < 186 lb (84.4 kg) (pt-stated)       Fall Risk Fall Risk  04/30/2019 11/06/2018  07/11/2018 04/26/2018 04/11/2018  Falls in the past year? 0 0 0 0 0  Number falls in past yr: 0 0 - 0 0  Injury with Fall? 0 0 - 0 0   Is the patient's home free of loose throw rugs in walkways, pet beds, electrical cords, etc?   yes      Grab bars in the bathroom? no      Handrails on the stairs?   yes      Adequate  lighting?   yes  Timed Get Up and Go Performed: na  Depression Screen PHQ 2/9 Scores 04/30/2019 07/11/2018 04/26/2018 03/28/2018  PHQ - 2 Score 0 0 0 0    Cognitive Function MMSE - Mini Mental State Exam 04/26/2018  Orientation to time 5  Orientation to Place 5  Registration 3  Recall 3  Language- name 2 objects 2  Language- repeat 1  Language- follow 3 step command 3  Language- read & follow direction 1  Write a sentence 1  Copy design 1     6CIT Screen 04/30/2019  What Year? 0 points  What month? 0 points  What time? 0 points  Count back from 20 0 points  Months in reverse 0 points  Repeat phrase 0 points  Total Score 0    Immunization History  Administered Date(s) Administered  . Fluad Quad(high Dose 65+) 11/06/2018  . Influenza, Seasonal, Injecte, Preservative Fre 02/15/2012  . Influenza-Unspecified 12/17/2016, 01/15/2018  . PFIZER SARS-COV-2 Vaccination 02/24/2019, 03/17/2019  . Pneumococcal Conjugate-13 02/19/2015  . Pneumococcal Polysaccharide-23 09/17/2009  . Td 03/31/2001  . Tdap 10/13/2010, 04/10/2018  . Zoster 05/25/2012  . Zoster Recombinat (Shingrix) 02/03/2017, 04/14/2017    Qualifies for Shingles Vaccine? Yes, done.   Screening Tests Health Maintenance  Topic Date Due  . Hepatitis C Screening  Never done  . Fecal DNA (Cologuard)  03/03/2019  . TETANUS/TDAP  04/10/2028  . INFLUENZA VACCINE  Completed  . PNA vac Low Risk Adult  Completed   Cancer Screenings: Lung: Low Dose CT Chest recommended if Age 62-80 years, 30 pack-year currently smoking OR have quit w/in 15years. Patient does not qualify. Colorectal: due for cologuard.   Additional Screenings:  Hepatitis C Screening:will get with blood work.       Plan:     I have personally reviewed and noted the following in the patient's chart:   . Medical and social history . Use of alcohol, tobacco or illicit drugs  . Current medications and supplements . Functional ability and  status . Nutritional status . Physical activity . Advanced directives . List of other physicians . Hospitalizations, surgeries, and ER visits in previous 12 months . Vitals . Screenings to include cognitive, depression, and falls . Referrals and appointments  In addition, I have reviewed and discussed with patient certain preventive protocols, quality metrics, and best practice recommendations. A written personalized care plan for preventive services as well as general preventive health recommendations were provided to patient.     Lauree Chandler, NP  04/30/2019

## 2019-04-30 NOTE — Progress Notes (Signed)
   This service is provided via telemedicine  No vital signs collected/recorded due to the encounter was a telemedicine visit.   Location of patient (ex: home, work):  Home  Patient consents to a telephone visit:  Yes  Location of the provider (ex: office, home):  [facility], Office   Name of any referring provider: N/A  Names of all persons participating in the telemedicine service and their role in the encounter:  S.Chrae B/CMA, Sherrie Mustache, NP, and Patient   Time spent on call:  8 min with medical assistant

## 2019-04-30 NOTE — Patient Instructions (Signed)
[name] , Thank you for taking time to come for your Medicare Wellness Visit. I appreciate your ongoing commitment to your health goals. Please review the following plan we discussed and let me know if I can assist you in the future.   Screening recommendations/referrals: Colonoscopy - will send off for cologuard Recommended yearly ophthalmology/optometry visit for glaucoma screening and checkup Recommended yearly dental visit for hygiene and checkup  Vaccinations: Influenza vaccine up to date Pneumococcal vaccine up to date Tdap vaccine up to date Shingles vaccine up to date    Advanced directives: recommended to complete. Will complete MOST form via virtual visit.   Conditions/risks identified: obesity noted based on BMI   Next appointment: 1 year   Preventive Care 76 Years and Older, Male Preventive care refers to lifestyle choices and visits with your health care provider that can promote health and wellness. What does preventive care include?  A yearly physical exam. This is also called an annual well check.  Dental exams once or twice a year.  Routine eye exams. Ask your health care provider how often you should have your eyes checked.  Personal lifestyle choices, including:  Daily care of your teeth and gums.  Regular physical activity.  Eating a healthy diet.  Avoiding tobacco and drug use.  Limiting alcohol use.  Practicing safe sex.  Taking low doses of aspirin every day.  Taking vitamin and mineral supplements as recommended by your health care provider. What happens during an annual well check? The services and screenings done by your health care provider during your annual well check will depend on your age, overall health, lifestyle risk factors, and family history of disease. Counseling  Your health care provider may ask you questions about your:  Alcohol use.  Tobacco use.  Drug use.  Emotional well-being.  Home and relationship  well-being.  Sexual activity.  Eating habits.  History of falls.  Memory and ability to understand (cognition).  Work and work Statistician. Screening  You may have the following tests or measurements:  Height, weight, and BMI.  Blood pressure.  Lipid and cholesterol levels. These may be checked every 5 years, or more frequently if you are over 76 years old.  Skin check.  Lung cancer screening. You may have this screening every year starting at age 76 if you have a 30-pack-year history of smoking and currently smoke or have quit within the past 15 years.  Fecal occult blood test (FOBT) of the stool. You may have this test every year starting at age 76.  Flexible sigmoidoscopy or colonoscopy. You may have a sigmoidoscopy every 5 years or a colonoscopy every 10 years starting at age 76.  Prostate cancer screening. Recommendations will vary depending on your family history and other risks.  Hepatitis C blood test.  Hepatitis B blood test.  Sexually transmitted disease (STD) testing.  Diabetes screening. This is done by checking your blood sugar (glucose) after you have not eaten for a while (fasting). You may have this done every 1-3 years.  Abdominal aortic aneurysm (AAA) screening. You may need this if you are a current or former smoker.  Osteoporosis. You may be screened starting at age 76 if you are at high risk. Talk with your health care provider about your test results, treatment options, and if necessary, the need for more tests. Vaccines  Your health care provider may recommend certain vaccines, such as:  Influenza vaccine. This is recommended every year.  Tetanus, diphtheria, and acellular pertussis (  Tdap, Td) vaccine. You may need a Td booster every 10 years.  Zoster vaccine. You may need this after age 76.  Pneumococcal 13-valent conjugate (PCV13) vaccine. One dose is recommended after age 76.  Pneumococcal polysaccharide (PPSV23) vaccine. One dose is  recommended after age 76. Talk to your health care provider about which screenings and vaccines you need and how often you need them. This information is not intended to replace advice given to you by your health care provider. Make sure you discuss any questions you have with your health care provider. Document Released: 02/28/2015 Document Revised: 10/22/2015 Document Reviewed: 12/03/2014 Elsevier Interactive Patient Education  2017 [place] Prevention in the Home Falls can cause injuries. They can happen to people of all ages. There are many things you can do to make your home safe and to help prevent falls. What can I do on the outside of my home?  Regularly fix the edges of walkways and driveways and fix any cracks.  Remove anything that might make you trip as you walk through a door, such as a raised step or threshold.  Trim any bushes or trees on the path to your home.  Use bright outdoor lighting.  Clear any walking paths of anything that might make someone trip, such as rocks or tools.  Regularly check to see if handrails are loose or broken. Make sure that both sides of any steps have handrails.  Any raised decks and porches should have guardrails on the edges.  Have any leaves, snow, or ice cleared regularly.  Use sand or salt on walking paths during winter.  Clean up any spills in your garage right away. This includes oil or grease spills. What can I do in the bathroom?  Use night lights.  Install grab bars by the toilet and in the tub and shower. Do not use towel bars as grab bars.  Use non-skid mats or decals in the tub or shower.  If you need to sit down in the shower, use a plastic, non-slip stool.  Keep the floor dry. Clean up any water that spills on the floor as soon as it happens.  Remove soap buildup in the tub or shower regularly.  Attach bath mats securely with double-sided non-slip rug tape.  Do not have throw rugs and other things on  the floor that can make you trip. What can I do in the bedroom?  Use night lights.  Make sure that you have a light by your bed that is easy to reach.  Do not use any sheets or blankets that are too big for your bed. They should not hang down onto the floor.  Have a firm chair that has side arms. You can use this for support while you get dressed.  Do not have throw rugs and other things on the floor that can make you trip. What can I do in the kitchen?  Clean up any spills right away.  Avoid walking on wet floors.  Keep items that you use a lot in easy-to-reach places.  If you need to reach something above you, use a strong step stool that has a grab bar.  Keep electrical cords out of the way.  Do not use floor polish or wax that makes floors slippery. If you must use wax, use non-skid floor wax.  Do not have throw rugs and other things on the floor that can make you trip. What can I do with my stairs?  Do  not leave any items on the stairs.  Make sure that there are handrails on both sides of the stairs and use them. Fix handrails that are broken or loose. Make sure that handrails are as long as the stairways.  Check any carpeting to make sure that it is firmly attached to the stairs. Fix any carpet that is loose or worn.  Avoid having throw rugs at the top or bottom of the stairs. If you do have throw rugs, attach them to the floor with carpet tape.  Make sure that you have a light switch at the top of the stairs and the bottom of the stairs. If you do not have them, ask someone to add them for you. What else can I do to help prevent falls?  Wear shoes that:  Do not have high heels.  Have rubber bottoms.  Are comfortable and fit you well.  Are closed at the toe. Do not wear sandals.  If you use a stepladder:  Make sure that it is fully opened. Do not climb a closed stepladder.  Make sure that both sides of the stepladder are locked into place.  Ask someone to  hold it for you, if possible.  Clearly mark and make sure that you can see:  Any grab bars or handrails.  First and last steps.  Where the edge of each step is.  Use tools that help you move around (mobility aids) if they are needed. These include:  Canes.  Walkers.  Scooters.  Crutches.  Turn on the lights when you go into a dark area. Replace any light bulbs as soon as they burn out.  Set up your furniture so you have a clear path. Avoid moving your furniture around.  If any of your floors are uneven, fix them.  If there are any pets around you, be aware of where they are.  Review your medicines with your doctor. Some medicines can make you feel dizzy. This can increase your chance of falling. Ask your doctor what other things that you can do to help prevent falls. This information is not intended to replace advice given to you by your health care provider. Make sure you discuss any questions you have with your health care provider. Document Released: 11/28/2008 Document Revised: 07/10/2015 Document Reviewed: 03/08/2014 Elsevier Interactive Patient Education  2017 [employer].

## 2019-05-07 DIAGNOSIS — Z1211 Encounter for screening for malignant neoplasm of colon: Secondary | ICD-10-CM | POA: Diagnosis not present

## 2019-05-07 DIAGNOSIS — Z1212 Encounter for screening for malignant neoplasm of rectum: Secondary | ICD-10-CM | POA: Diagnosis not present

## 2019-05-07 LAB — COLOGUARD: Cologuard: POSITIVE — AB

## 2019-05-09 ENCOUNTER — Other Ambulatory Visit: Payer: Self-pay

## 2019-05-09 ENCOUNTER — Other Ambulatory Visit: Payer: Medicare Other

## 2019-05-09 DIAGNOSIS — R739 Hyperglycemia, unspecified: Secondary | ICD-10-CM

## 2019-05-09 DIAGNOSIS — Z1159 Encounter for screening for other viral diseases: Secondary | ICD-10-CM | POA: Diagnosis not present

## 2019-05-09 DIAGNOSIS — I10 Essential (primary) hypertension: Secondary | ICD-10-CM

## 2019-05-09 DIAGNOSIS — E785 Hyperlipidemia, unspecified: Secondary | ICD-10-CM | POA: Diagnosis not present

## 2019-05-10 LAB — CBC WITH DIFFERENTIAL/PLATELET
Absolute Monocytes: 702 cells/uL (ref 200–950)
Basophils Absolute: 42 cells/uL (ref 0–200)
Basophils Relative: 0.7 %
Eosinophils Absolute: 180 cells/uL (ref 15–500)
Eosinophils Relative: 3 %
HCT: 43 % (ref 38.5–50.0)
Hemoglobin: 14.2 g/dL (ref 13.2–17.1)
Lymphs Abs: 1140 cells/uL (ref 850–3900)
MCH: 31.3 pg (ref 27.0–33.0)
MCHC: 33 g/dL (ref 32.0–36.0)
MCV: 94.7 fL (ref 80.0–100.0)
MPV: 10.2 fL (ref 7.5–12.5)
Monocytes Relative: 11.7 %
Neutro Abs: 3936 cells/uL (ref 1500–7800)
Neutrophils Relative %: 65.6 %
Platelets: 182 10*3/uL (ref 140–400)
RBC: 4.54 10*6/uL (ref 4.20–5.80)
RDW: 12.9 % (ref 11.0–15.0)
Total Lymphocyte: 19 %
WBC: 6 10*3/uL (ref 3.8–10.8)

## 2019-05-10 LAB — COMPLETE METABOLIC PANEL WITH GFR
AG Ratio: 1.6 (calc) (ref 1.0–2.5)
ALT: 23 U/L (ref 9–46)
AST: 22 U/L (ref 10–35)
Albumin: 4.1 g/dL (ref 3.6–5.1)
Alkaline phosphatase (APISO): 64 U/L (ref 35–144)
BUN: 21 mg/dL (ref 7–25)
CO2: 26 mmol/L (ref 20–32)
Calcium: 9 mg/dL (ref 8.6–10.3)
Chloride: 107 mmol/L (ref 98–110)
Creat: 0.85 mg/dL (ref 0.70–1.18)
GFR, Est African American: 99 mL/min/{1.73_m2} (ref >=60)
GFR, Est Non African American: 85 mL/min/{1.73_m2} (ref >=60)
Globulin: 2.5 g/dL (calc) (ref 1.9–3.7)
Glucose, Bld: 119 mg/dL — ABNORMAL HIGH (ref 65–99)
Potassium: 4.4 mmol/L (ref 3.5–5.3)
Sodium: 139 mmol/L (ref 135–146)
Total Bilirubin: 0.6 mg/dL (ref 0.2–1.2)
Total Protein: 6.6 g/dL (ref 6.1–8.1)

## 2019-05-10 LAB — HEMOGLOBIN A1C
Hgb A1c MFr Bld: 5.8 % of total Hgb — ABNORMAL HIGH (ref <5.7)
Mean Plasma Glucose: 120 (calc)
eAG (mmol/L): 6.6 (calc)

## 2019-05-10 LAB — LIPID PANEL
Cholesterol: 147 mg/dL (ref <200)
HDL: 63 mg/dL (ref >=40)
LDL Cholesterol (Calc): 72 mg/dL (calc)
Non-HDL Cholesterol (Calc): 84 mg/dL (calc) (ref <130)
Total CHOL/HDL Ratio: 2.3 (calc) (ref <5.0)
Triglycerides: 41 mg/dL (ref <150)

## 2019-05-10 LAB — HEPATITIS C ANTIBODY
Hepatitis C Ab: NONREACTIVE
SIGNAL TO CUT-OFF: 0.01 (ref <1.00)

## 2019-05-11 ENCOUNTER — Ambulatory Visit (INDEPENDENT_AMBULATORY_CARE_PROVIDER_SITE_OTHER): Payer: Medicare Other | Admitting: Nurse Practitioner

## 2019-05-11 ENCOUNTER — Other Ambulatory Visit: Payer: Self-pay

## 2019-05-11 ENCOUNTER — Encounter: Payer: Self-pay | Admitting: Nurse Practitioner

## 2019-05-11 VITALS — BP 118/72 | HR 78 | Temp 97.5°F | Ht 67.0 in | Wt 200.4 lb

## 2019-05-11 DIAGNOSIS — L989 Disorder of the skin and subcutaneous tissue, unspecified: Secondary | ICD-10-CM

## 2019-05-11 DIAGNOSIS — I10 Essential (primary) hypertension: Secondary | ICD-10-CM | POA: Diagnosis not present

## 2019-05-11 DIAGNOSIS — E785 Hyperlipidemia, unspecified: Secondary | ICD-10-CM

## 2019-05-11 DIAGNOSIS — I25118 Atherosclerotic heart disease of native coronary artery with other forms of angina pectoris: Secondary | ICD-10-CM | POA: Diagnosis not present

## 2019-05-11 DIAGNOSIS — F325 Major depressive disorder, single episode, in full remission: Secondary | ICD-10-CM

## 2019-05-11 DIAGNOSIS — R739 Hyperglycemia, unspecified: Secondary | ICD-10-CM

## 2019-05-11 DIAGNOSIS — G4733 Obstructive sleep apnea (adult) (pediatric): Secondary | ICD-10-CM

## 2019-05-11 DIAGNOSIS — Z1283 Encounter for screening for malignant neoplasm of skin: Secondary | ICD-10-CM

## 2019-05-11 NOTE — Patient Instructions (Addendum)
Look over MOST form Will do virtual visit and will complete in 2-4 weeks (can do on Tuesday or Thursday)    Keep up the good work with walking.   6 months follow up with labs before.   If you do not hear back about referral in a week please call our office back and ask to speak with Lattie Haw (our referral coordinator) for an update.

## 2019-05-11 NOTE — Progress Notes (Signed)
Careteam: Patient Care Team: Lauree Chandler, NP as PCP - General (Geriatric Medicine) Fanny Skates, MD as Consulting Physician (General Surgery) Marchia Bond, MD as Consulting Physician (Orthopedic Surgery) Jarome Matin, MD as Consulting Physician (Dermatology) Belva Crome, MD as Consulting Physician (Cardiology) Dyke Maes, OD as Referring Physician (Optometry) Kathrynn Ducking, MD as Consulting Physician (Neurology)  PLACE OF SERVICE:  [place] Directive information Does Patient Have a Medical Advance Directive?: No, Would patient like information on creating a medical advance directive?: Yes (MAU/Ambulatory/Procedural Areas - Information given)(Already has paperwork)  No Known Allergies  Chief Complaint  Patient presents with  . Medical Management of Chronic Issues    6 month follow-up and discuss labs (copy printed)      HPI: Patient is a 74 y.o. male for routine follow up. Has AWV earlier this month  Walks every morning, 4-5 miles.   Hand discomfort has been ongoing- got covid vaccine and it has improved. Lumps on arms have also went down.  Hyperlipidemia- LDL 72, at goal on lipitor 80 mg daily, continues on morning walks- has not changed diet.   htn-well controlled. On current regimen   CAD- continues on asa 81 mg daily  Anxiety/depression-continues to be in remission and feeling good on zoloft   GERD- continues on omeprazole.   Ongoing numbness and tingling in left foot- not an issue when walking, 2nd toe gets very tender. Wearing a sock helps. Does not both him at night. Followed with neurologist due to issue but did not follow up.     Hx of OSA- suggested cpap which was YEARS ago- more than 5 maybe 10.   Continues with fatigue and not feeling well rested. Hx of OSA, not using CPAP  Occasional hot flash, light headed, break out in a sweat. Will lay down and feels better. Will happen and then resolve. Happened twice in 1 week  about 1 month ago. But generally only 3-4 times a year.   Has tender spot on face, over 2 years that never changes. Talked to Dr Ronnald Ramp, (dermatology). Wants a second opinion   Review of Systems:  Review of Systems  Constitutional: Negative for chills, fever and weight loss.  HENT: Negative for tinnitus.   Respiratory: Negative for cough, sputum production and shortness of breath.   Cardiovascular: Negative for chest pain, palpitations and leg swelling.  Gastrointestinal: Negative for abdominal pain, constipation, diarrhea and heartburn.  Genitourinary: Negative for dysuria, frequency and urgency.  Musculoskeletal: Negative for back pain, falls, joint pain and myalgias.  Skin: Negative.   Neurological: Negative for dizziness and headaches.  Psychiatric/Behavioral: Negative for depression and memory loss. The patient does not have insomnia.     Past Medical History:  Diagnosis Date  . Arthritis    lt ankle  . Breast lump    Per [facility] from [facility]   . Coronary artery disease    a. BMS to RCA 2003 with residual LAD/diag disease treated medically, normal EF.  Marland Kitchen Depression   . Diverticulosis    Per records from [facility]   . GERD (gastroesophageal reflux disease)   . Hyperkalemia    a. K of 5.2 in 2017.  Marland Kitchen Hyperlipidemia   . Hypertension   . Idiopathic peripheral neuropathy    Per records from [facility]   . Impaired fasting glucose    Per [facility] from [facility]   . Monoclonal gammopathy of undetermined significance    Per records from [facility]   . Myocardial infarction ([place]) 11/15/2001   Dr. Daneen Schick [facility])  . OSA (obstructive sleep apnea)    Per [facility] New Patient Packet  . Peripheral neuropathy    Left Foot, Per [place] New Patient Packet  . Pre-diabetes   . Sleep apnea    had test several yr ago-said he did not need a cpap-still snores  . Torn rotator cuff    Per [place] Patient Packet  . Venous insufficiency of left leg      Per [place] Patient Packet  . Wears glasses    Past Surgical History:  Procedure Laterality Date  . ANGIOPLASTY  2003   [facility]   . CARDIAC CATHETERIZATION  23003   stent rca  . CARDIOVASCULAR STRESS TEST  02/16/2016   [facility]   . COLONOSCOPY  06/17/2003   Per records from [facility], Dr.Ganem to be repeated 2015  . coronary artery stent  11/15/2001  . CORONARY STENT PLACEMENT  11/15/2001   Per records from [facility]   . CYST REMOVAL TRUNK Left 05/08/2013   Procedure: CYST REMOVAL BACK;  Surgeon: Adin Hector, MD;  Location: [place];  Service: General;  Laterality: Left;  . INGUINAL HERNIA REPAIR Left 03/24/2015   Procedure: OPEN REPAIR LEFT INGUINAL HERNIA ;  Surgeon: Fanny Skates, MD;  Location: [place];  Service: General;  Laterality: Left;  . INSERTION OF MESH Left 03/24/2015   Procedure: INSERTION OF MESH;  Surgeon: Fanny Skates, MD;  Location: [place];  Service: General;  Laterality: Left;  . SHOULDER ARTHROSCOPY W/ ROTATOR CUFF REPAIR  2011   right  . SPINE SURGERY  02/12/1993   L2, L3 fragmented disc  . VASECTOMY  1987  . VEIN REPAIR  09/2018   Vein injections    Social History:   reports that he quit smoking about 45 years ago. His smoking use included cigarettes. He quit after 12.00 years of use. He has never used smokeless tobacco. He reports current alcohol use of about 14.0 standard drinks of alcohol per week. He reports that he does not use drugs.  Family History  Problem Relation Age of Onset  . Macular degeneration Mother   . COPD Mother   . Non-Hodgkin's lymphoma Mother   . Lung cancer Mother        Per [facility] New Patient Packet   . Hypertension Father   . Suicidality Father 52       Per [place] New Patient Packet   . Depression Father   . Angina Father        Per [facility]   . Diverticulosis Father        Per records  from [facility]   . High Cholesterol Sister   . Macular degeneration Maternal Grandmother   . Autism Son   . Post-traumatic stress disorder Son   . Diabetes type II Daughter   . Breast cancer Neg Hx     Medications: Patient's Medications  New Prescriptions   No medications on file  Previous Medications   ASPIRIN 81 MG TABLET    Take 1 tablet (81 mg total) by mouth daily.   ATORVASTATIN (LIPITOR) 80 MG TABLET    TAKE 1 TABLET BY MOUTH  DAILY   CARVEDILOL (COREG) 12.5 MG TABLET    TAKE 1 TABLET BY MOUTH  TWICE DAILY   MULTIPLE VITAMIN (MULTIVITAMIN WITH MINERALS)  TABS TABLET    Take 1 tablet by mouth daily.   NAPROXEN SODIUM (ALEVE) 220 MG TABLET    Take 220 mg by mouth as needed.   OMEPRAZOLE PO    Take 20 mg by mouth every morning.    RAMIPRIL (ALTACE) 10 MG CAPSULE    Take 1 capsule (10 mg total) by mouth daily.   SERTRALINE (ZOLOFT) 50 MG TABLET    TAKE 1 TABLET BY MOUTH  DAILY  Modified Medications   No medications on file  Discontinued Medications   No medications on file    Physical Exam:  Vitals:   05/11/19 1108  BP: 118/72  Pulse: 78  Temp: (!) 97.5 F (36.4 C)  TempSrc: Temporal  SpO2: 96%  Weight: 200 lb 6.4 oz (90.9 kg)  Height: 5\' 7"  (1.702 m)   Body mass index is 31.39 kg/m. Wt Readings from Last 3 Encounters:  05/11/19 200 lb 6.4 oz (90.9 kg)  11/06/18 204 lb (92.5 kg)  08/09/18 203 lb 12.8 oz (92.4 kg)    Physical Exam Constitutional:      General: He is not in acute distress.    Appearance: He is well-developed. He is not diaphoretic.  HENT:     Head: Normocephalic and atraumatic.  Eyes:     Conjunctiva/sclera: Conjunctivae normal.     Pupils: Pupils are equal, round, and reactive to light.  Cardiovascular:     Rate and Rhythm: Normal rate and regular rhythm.     Heart sounds: Normal heart sounds.  Pulmonary:     Effort: Pulmonary effort is normal.     Breath sounds: Normal breath sounds.  Abdominal:     General: Bowel sounds are  normal.     Palpations: Abdomen is soft.  Musculoskeletal:        General: No tenderness.     Cervical back: Normal range of motion and neck supple.  Skin:    General: Skin is warm and dry.  Neurological:     Mental Status: He is alert and oriented to person, place, and time. Mental status is at baseline.  Psychiatric:        Mood and Affect: Mood normal.     Labs reviewed: Basic Metabolic Panel: Recent Labs    08/24/18 1000 10/16/18 0836 05/09/19 0825  NA 138 139 139  K 4.4 4.5 4.4  CL 104 107 107  CO2 22 27 26   GLUCOSE 123* 106* 119*  BUN 21 28* 21  CREATININE 0.88 0.75 0.85  CALCIUM 9.1 8.9 9.0   Liver Function Tests: Recent Labs    10/16/18 0836 05/09/19 0825  AST 23 22  ALT 23 23  BILITOT 0.7 0.6  PROT 6.4 6.6   No results for input(s): LIPASE, AMYLASE in the last 8760 hours. No results for input(s): AMMONIA in the last 8760 hours. CBC: Recent Labs    10/16/18 0836 05/09/19 0825  WBC 6.0 6.0  NEUTROABS 3,756 3,936  HGB 14.0 14.2  HCT 42.3 43.0  MCV 95.3 94.7  PLT 176 182   Lipid Panel: Recent Labs    10/16/18 0836 05/09/19 0825  CHOL 129 147  HDL 64 63  LDLCALC 54 72  TRIG 40 41  CHOLHDL 2.0 2.3   TSH: No results for input(s): TSH in the last 8760 hours. A1C: Lab Results  Component Value Date   HGBA1C 5.8 (H) 05/09/2019     Assessment/Plan 1. Major depressive disorder in full remission, unspecified whether recurrent (HCC) Stable on zoloft  50 mg daily   2. Atherosclerosis of native coronary artery of native heart with stable angina pectoris (HCC) Stable without chest pains on coreg, continues on ASA 81 mg daily with coreg and lipitor.  3. Essential hypertension, benign Controlled on ramipril, coreg  4. Obstructive sleep apnea Encouraged follow up with pulmonary for evaluation and discussion on alternate mask (had trouble tolerating this in the past)   5. Hyperlipidemia, unspecified hyperlipidemia type Stable on lipitor, to  continue dietary modifications with increase in physical activity   6. Skin cancer screening - Ambulatory referral to Dermatology  7. Skin lesion of cheek -chronic area on cheek that has been unchanged, previous dermatologist did not seem concern but he would like another option.  - Ambulatory referral to Dermatology  8. Hyperglycemia A1c remains stable. Encouraged pt to check blood sugar if he had another event of hot flash and feeling light headed to make sure it was not related to high or low blood sugar.   Next appt: 4 weeks for MOST,  52month for routine follow up  [place]. [place], [place] [facility] [phone]

## 2019-05-16 ENCOUNTER — Telehealth: Payer: Self-pay

## 2019-05-16 ENCOUNTER — Encounter: Payer: Self-pay | Admitting: Gastroenterology

## 2019-05-16 DIAGNOSIS — R195 Other fecal abnormalities: Secondary | ICD-10-CM

## 2019-05-16 NOTE — Telephone Encounter (Signed)
Called and spoke with patient about his Cologuard results and that Janett Billow would like to refer him to a GI Specialist.  And would he be willing to go see them, he answered yes I informed him that a referral wold be sent on his behalf and they would contact him to schedule an appointment

## 2019-05-17 NOTE — Telephone Encounter (Signed)
Order placed for GI referral.   

## 2019-05-23 DIAGNOSIS — L853 Xerosis cutis: Secondary | ICD-10-CM | POA: Diagnosis not present

## 2019-05-23 DIAGNOSIS — R202 Paresthesia of skin: Secondary | ICD-10-CM | POA: Diagnosis not present

## 2019-05-23 DIAGNOSIS — L710 Perioral dermatitis: Secondary | ICD-10-CM | POA: Diagnosis not present

## 2019-05-23 DIAGNOSIS — D485 Neoplasm of uncertain behavior of skin: Secondary | ICD-10-CM | POA: Diagnosis not present

## 2019-05-23 DIAGNOSIS — L820 Inflamed seborrheic keratosis: Secondary | ICD-10-CM | POA: Diagnosis not present

## 2019-05-23 DIAGNOSIS — B079 Viral wart, unspecified: Secondary | ICD-10-CM | POA: Diagnosis not present

## 2019-05-24 ENCOUNTER — Encounter: Payer: Self-pay | Admitting: Nurse Practitioner

## 2019-05-24 ENCOUNTER — Telehealth (INDEPENDENT_AMBULATORY_CARE_PROVIDER_SITE_OTHER): Payer: Medicare Other | Admitting: Nurse Practitioner

## 2019-05-24 ENCOUNTER — Other Ambulatory Visit: Payer: Self-pay

## 2019-05-24 DIAGNOSIS — Z7189 Other specified counseling: Secondary | ICD-10-CM

## 2019-05-24 NOTE — Progress Notes (Signed)
This service is provided via telemedicine  No vital signs collected/recorded due to the encounter was a telemedicine visit.   Location of patient (ex: home, work): Home.  Patient consents to a telephone visit: Yes  Location of the provider (ex: office, home):  [facility].   Name of any referring provider:  N/A  Names of all persons participating in the telemedicine service and their role in the encounter:  Patient, Tim Walters, RMA, Sherrie Mustache, NP.    Time spent on call:  8 minutes on the phone with Medical Assistant.       Careteam: Patient Care Team: Lauree Chandler, NP as PCP - General (Geriatric Medicine) Fanny Skates, MD as Consulting Physician (General Surgery) Marchia Bond, MD as Consulting Physician (Orthopedic Surgery) Jarome Matin, MD as Consulting Physician (Dermatology) Belva Crome, MD as Consulting Physician (Cardiology) Dyke Maes, OD as Referring Physician (Optometry) Kathrynn Ducking, MD as Consulting Physician (Neurology)   Advanced Directive information Does Patient Have a Medical Advance Directive?: No  No Known Allergies  Chief Complaint  Patient presents with  . Form Completion    Most Form Completion.     HPI: Patient is a 74 y.o. male seen today via virtual visit to complete MOST form.  Wife with pt during visit.   Review of Systems:  Review of Systems  Constitutional: Negative for chills, fever and malaise/fatigue.  All other systems reviewed and are negative.   Past Medical History:  Diagnosis Date  . Arthritis    lt ankle  . Breast lump    Per records from [facility]   . Coronary artery disease    a. BMS to RCA 2003 with residual LAD/diag disease treated medically, normal EF.  Marland Kitchen Depression   . Diverticulosis    Per records [facility]   . GERD (gastroesophageal reflux disease)   . Hyperkalemia    a. K of 5.2 in 2017.  Marland Kitchen Hyperlipidemia   . Hypertension   . Idiopathic  peripheral neuropathy    Per records from [facility]   . Impaired fasting glucose    Per records from [facility]   . Monoclonal gammopathy of undetermined significance    Per records [facility]   . Myocardial infarction ([place]) 11/15/2001   Dr. Daneen Schick [facility] Cardiology)  . OSA (obstructive sleep apnea)    Per [facility] New Patient Packet  . Peripheral neuropathy    Left Foot, Per [place] New Patient Packet  . Pre-diabetes   . Sleep apnea    had test several yr ago-said he did not need a cpap-still snores  . Torn rotator cuff    Per [place] Patient Packet  . Venous insufficiency of left leg    Per [place] Patient Packet  . Wears glasses    Past Surgical History:  Procedure Laterality Date  . ANGIOPLASTY  2003   Per records from [facility]   . CARDIAC CATHETERIZATION  23003   stent rca  . CARDIOVASCULAR STRESS TEST  02/16/2016   Per records from [facility]   . COLONOSCOPY  06/17/2003   Per records from [facility], Dr.Ganem to be repeated 2015  . coronary artery stent  11/15/2001  . CORONARY STENT PLACEMENT  11/15/2001   Per records from [facility]   . CYST REMOVAL TRUNK Left 05/08/2013   Procedure: CYST REMOVAL BACK;  Surgeon: Adin Hector, MD;  Location: [place];  Service: General;  Laterality: Left;  . INGUINAL HERNIA REPAIR Left 03/24/2015   Procedure: OPEN REPAIR LEFT INGUINAL HERNIA ;  Surgeon: Fanny Skates, MD;  Location: [place];  Service: General;  Laterality: Left;  . INSERTION OF MESH Left 03/24/2015   Procedure: INSERTION OF MESH;  Surgeon: Fanny Skates, MD;  Location: [place];  Service: General;  Laterality: Left;  . SHOULDER ARTHROSCOPY W/ ROTATOR CUFF REPAIR  2011   right  . SPINE SURGERY  02/12/1993   L2, L3 fragmented disc  . VASECTOMY  1987  . VEIN REPAIR  09/2018   Vein injections    Social History:   reports that he quit smoking about 45 years ago. His  smoking use included cigarettes. He quit after 12.00 years of use. He has never used smokeless tobacco. He reports current alcohol use of about 14.0 standard drinks of alcohol per week. He reports that he does not use drugs.  Family History  Problem Relation Age of Onset  . Macular degeneration Mother   . COPD Mother   . Non-Hodgkin's lymphoma Mother   . Lung cancer Mother        Per [facility] New Patient Packet   . Hypertension Father   . Suicidality Father 74       Per [place] New Patient Packet   . Depression Father   . Angina Father        Per records from [facility]   . Diverticulosis Father        Per [facility]   . High Cholesterol Sister   . Macular degeneration Maternal Grandmother   . Autism Son   . Post-traumatic stress disorder Son   . Diabetes type II Daughter   . Breast cancer Neg Hx     Medications: Patient's Medications  New Prescriptions   No medications on file  Previous Medications   ASPIRIN 81 MG TABLET    Take 1 tablet (81 mg total) by mouth daily.   ATORVASTATIN (LIPITOR) 80 MG TABLET    TAKE 1 TABLET BY MOUTH  DAILY   CARVEDILOL (COREG) 12.5 MG TABLET    TAKE 1 TABLET BY MOUTH  TWICE DAILY   MULTIPLE VITAMIN (MULTIVITAMIN WITH MINERALS) TABS TABLET    Take 1 tablet by mouth daily.   NAPROXEN SODIUM (ALEVE) 220 MG TABLET    Take 220 mg by mouth as needed.   OMEPRAZOLE PO    Take 20 mg by mouth every morning.    RAMIPRIL (ALTACE) 10 MG CAPSULE    Take 1 capsule (10 mg total) by mouth daily.   SERTRALINE (ZOLOFT) 50 MG TABLET    TAKE 1 TABLET BY MOUTH  DAILY  Modified Medications   No medications on file  Discontinued Medications   No medications on file    Physical Exam:  There were no vitals filed for this visit. There is no height or weight on file to calculate BMI. Wt Readings from Last 3 Encounters:  05/11/19 200 lb 6.4 oz (90.9 kg)  11/06/18 204 lb (92.5 kg)  08/09/18 203 lb 12.8 oz (92.4 kg)      Labs reviewed: Basic  Metabolic Panel: Recent Labs    08/24/18 1000 10/16/18 0836 05/09/19 0825  NA 138 139 139  K 4.4 4.5 4.4  CL 104 107 107  CO2 22 27 26   GLUCOSE 123* 106* 119*  BUN 21 28* 21  CREATININE 0.88 0.75 0.85  CALCIUM 9.1 8.9 9.0   Liver  Function Tests: Recent Labs    10/16/18 0836 05/09/19 0825  AST 23 22  ALT 23 23  BILITOT 0.7 0.6  PROT 6.4 6.6   No results for input(s): LIPASE, AMYLASE in the last 8760 hours. No results for input(s): AMMONIA in the last 8760 hours. CBC: Recent Labs    10/16/18 0836 05/09/19 0825  WBC 6.0 6.0  NEUTROABS 3,756 3,936  HGB 14.0 14.2  HCT 42.3 43.0  MCV 95.3 94.7  PLT 176 182   Lipid Panel: Recent Labs    10/16/18 0836 05/09/19 0825  CHOL 129 147  HDL 64 63  LDLCALC 54 72  TRIG 40 41  CHOLHDL 2.0 2.3   TSH: No results for input(s): TSH in the last 8760 hours. A1C: Lab Results  Component Value Date   HGBA1C 5.8 (H) 05/09/2019     Assessment/Plan 1. Advance care planning -most form completed with pt and all questions answered -pink form will be mailed to pt - DNR (Do Not Resuscitate)- yellow form will be mailed to pt   Next appt: 11/08/2019 as scheduled Shalanda Brogden K. Harle Battiest  [facility] [phone]   Virtual Visit via Video Note  I connected with Tim Walters on 05/24/19 at  1:00 PM EDT by a video enabled telemedicine application and verified that I am speaking with the correct person using two identifiers.  Location: Patient: home Provider: [facility]   I discussed the limitations of evaluation and management by telemedicine and the availability of in person appointments. The patient expressed understanding and agreed to proceed.    I discussed the assessment and treatment plan with the patient. The patient was provided an opportunity to ask questions and all were answered. The patient agreed with the plan and demonstrated an understanding of the instructions.   The  patient was advised to call back or seek an in-person evaluation if the symptoms worsen or if the condition fails to improve as anticipated.  I provided 12 minutes of non-face-to-face time during this encounter.  Carlos American. Dewaine Oats, AGNP Avs printed and mailed.

## 2019-05-31 ENCOUNTER — Encounter: Payer: Self-pay | Admitting: Gastroenterology

## 2019-05-31 ENCOUNTER — Ambulatory Visit: Payer: Medicare Other | Admitting: Gastroenterology

## 2019-05-31 VITALS — BP 152/70 | HR 60 | Temp 98.0°F | Ht 67.0 in | Wt 201.0 lb

## 2019-05-31 DIAGNOSIS — R195 Other fecal abnormalities: Secondary | ICD-10-CM | POA: Diagnosis not present

## 2019-05-31 MED ORDER — NA SULFATE-K SULFATE-MG SULF 17.5-3.13-1.6 GM/177ML PO SOLN: 1.0000 | Freq: Once | ORAL | 0 refills | Status: AC

## 2019-05-31 NOTE — Patient Instructions (Signed)
If you are age 76 or older, your body mass index should be between 23-30. Your Body mass index is 31.48 kg/m. If this is out of the aforementioned range listed, please consider follow up with your Primary Care Provider.  If you are age 6 or younger, your body mass index should be between 19-25. Your Body mass index is 31.48 kg/m. If this is out of the aformentioned range listed, please consider follow up with your Primary Care Provider.   You have been scheduled for a colonoscopy. Please follow written instructions given to you at your visit today.  Please pick up your prep supplies at the pharmacy within the next 1-3 days. If you use inhalers (even only as needed), please bring them with you on the day of your procedure.  Due to recent changes in healthcare laws, you may see the results of your imaging and laboratory studies on MyChart before your provider has had a chance to review them.  We understand that in some cases there may be results that are confusing or concerning to you. Not all laboratory results come back in the same time frame and the provider may be waiting for multiple results in order to interpret others.  Please give [place] 48 hours in order for your provider to thoroughly review all the results before contacting the office for clarification of your results.

## 2019-05-31 NOTE — Progress Notes (Signed)
05/31/2019 Tim Walters [id number] 1945-10-26   HISTORY OF PRESENT ILLNESS:  This is a 74 year old male who is new to our office.  He was referred here by his PCP, Sherrie Mustache, NP, for evaluation regarding a positive Cologuard study.  He tells me that he had a Cologuard 3 years ago that was negative.  This one recently was positive.  He reports that he had a colonoscopy or a flexible sigmoidoscopy remotely, sounds like probably by [facility].  Recalls possibly having polyps removed at that time.  Denies any complaints.  Moves his bowels well without issues.  No rectal bleeding.    Does report some acid reflux issues, but takes omeprazole 20 mg daily and that controls his symptoms well.  Only has symptoms once every couple of months, usually with something acidic like spaghetti, etc.  Past Medical History:  Diagnosis Date  . Arthritis    lt ankle  . Breast lump    Per records from [facility]   . Colon polyp   . Coronary artery disease    a. BMS to RCA 2003 with residual LAD/diag disease treated medically, normal EF.  Marland Kitchen Depression   . Diverticulosis    Per records from [facility]   . GERD (gastroesophageal reflux disease)   . Hyperkalemia    a. K of 5.2 in 2017.  Marland Kitchen Hyperlipidemia   . Hypertension   . Idiopathic peripheral neuropathy    Per records from [facility]   . Impaired fasting glucose    Per records from [facility]   . Kidney stones   . Monoclonal gammopathy of undetermined significance    Per records from [facility]   . Myocardial infarction ([place]) 11/15/2001   Dr. Daneen Schick [facility])  . OSA (obstructive sleep apnea)    Per [facility] New Patient Packet  . Peripheral neuropathy    Left Foot, Per [place] New Patient Packet  . Pre-diabetes   . Sleep apnea    had test several yr ago-said he did not need a cpap-still snores  . Torn rotator cuff    Per [place] Patient Packet  . Venous insufficiency of left leg    Per [place]  Patient Packet  . Wears glasses    Past Surgical History:  Procedure Laterality Date  . ANGIOPLASTY  2003   Per records from [facility]   . CARDIAC CATHETERIZATION  23003   stent rca  . CARDIOVASCULAR STRESS TEST  02/16/2016   Per records from [facility]   . COLONOSCOPY  06/17/2003   Per records from [facility], Dr.Ganem to be repeated 2015  . coronary artery stent  11/15/2001  . CORONARY STENT PLACEMENT  11/15/2001   Per records from [facility]   . CYST REMOVAL TRUNK Left 05/08/2013   Procedure: CYST REMOVAL BACK;  Surgeon: Adin Hector, MD;  Location: [place];  Service: General;  Laterality: Left;  . INGUINAL HERNIA REPAIR Left 03/24/2015   Procedure: OPEN REPAIR LEFT INGUINAL HERNIA ;  Surgeon: Fanny Skates, MD;  Location: [place];  Service: General;  Laterality: Left;  . INSERTION OF MESH Left 03/24/2015   Procedure: INSERTION OF MESH;  Surgeon: Fanny Skates, MD;  Location: [place];  Service: General;  Laterality: Left;  . SHOULDER ARTHROSCOPY W/ ROTATOR CUFF REPAIR  2011   right  . SPINE SURGERY  02/12/1993   L2, L3 fragmented disc  . VASECTOMY  [place]  09/2018   Vein injections     reports that he quit smoking about 45 years ago. His smoking use included cigarettes. He quit after 12.00 years of use. He has never used smokeless tobacco. He reports current alcohol use of about 14.0 standard drinks of alcohol per week. He reports that he does not use drugs. family history includes Angina in his father; Autism in his son; COPD in his mother; Depression in his father; Diabetes type II in his daughter; Diverticulosis in his father; Heart disease in his father; High Cholesterol in his sister; Hypertension in his father; Lung cancer in his mother; Macular degeneration in his maternal grandmother and mother; Non-Hodgkin's lymphoma in his mother; Post-traumatic stress disorder in his son;  Suicidality (age of onset: 68) in his father. No Known Allergies    Outpatient Encounter Medications as of 05/31/2019  Medication Sig  . aspirin 81 MG tablet Take 1 tablet (81 mg total) by mouth daily.  Marland Kitchen atorvastatin (LIPITOR) 80 MG tablet TAKE 1 TABLET BY MOUTH  DAILY  . carvedilol (COREG) 12.5 MG tablet TAKE 1 TABLET BY MOUTH  TWICE DAILY  . Multiple Vitamin (MULTIVITAMIN WITH MINERALS) TABS tablet Take 1 tablet by mouth daily.  . naproxen sodium (ALEVE) 220 MG tablet Take 220 mg by mouth as needed.  Marland Kitchen OMEPRAZOLE PO Take 20 mg by mouth every morning.   . ramipril (ALTACE) 10 MG capsule Take 1 capsule (10 mg total) by mouth daily.  . sertraline (ZOLOFT) 50 MG tablet TAKE 1 TABLET BY MOUTH  DAILY  . Na Sulfate-K Sulfate-Mg Sulf 17.5-3.13-1.6 GM/177ML SOLN Take 1 kit by mouth once for 1 dose.   No facility-administered encounter medications on file as of 05/31/2019.     REVIEW OF SYSTEMS  : All other systems reviewed and negative except where noted in the History of Present Illness.   PHYSICAL EXAM: BP (!) 152/70   Pulse 60   Temp 98 F (36.7 C)   Ht '5\' 7"'  (1.702 m)   Wt 201 lb (91.2 kg)   BMI 31.48 kg/m  General: Well developed white male in no acute distress Head: Normocephalic and atraumatic Eyes:  Sclerae anicteric, conjunctiva pink. Ears: Normal auditory acuity Lungs: Clear throughout to auscultation; no increased WOB. Heart: Regular rate and rhythm; no M/R/G. Abdomen: Soft, non-distended.  BS present.  Non-tender. Rectal:  Will be done at the time of colonoscopy. Musculoskeletal: Symmetrical with no gross deformities  Skin: No lesions on visible extremities Extremities: No edema  Neurological: Alert oriented x 4, grossly non-focal Psychological:  Alert and cooperative. Normal mood and affect  ASSESSMENT AND PLAN: *Positive Cologuard:  Had a negative cologuard 3 years ago.  This time positive.  Had remote colonoscopy or flex sig through [place], but was years ago.  No  symptoms.  Will plan for colonoscopy with Dr. Fuller Plan.  The risks, benefits, and alternatives to colonoscopy were discussed with the patient and he consents to proceed.    CC:  Lauree Chandler, NP

## 2019-05-31 NOTE — Progress Notes (Signed)
Reviewed and agree with management plan.  Areta Terwilliger T. Folashade Gamboa, MD FACG [facility]  

## 2019-06-13 ENCOUNTER — Other Ambulatory Visit: Payer: Self-pay

## 2019-06-13 ENCOUNTER — Encounter: Payer: Self-pay | Admitting: Gastroenterology

## 2019-06-13 ENCOUNTER — Ambulatory Visit (AMBULATORY_SURGERY_CENTER): Payer: Medicare Other | Admitting: Gastroenterology

## 2019-06-13 VITALS — BP 122/72 | HR 53 | Temp 98.2°F | Resp 13 | Ht 67.0 in | Wt 201.0 lb

## 2019-06-13 DIAGNOSIS — K640 First degree hemorrhoids: Secondary | ICD-10-CM | POA: Diagnosis not present

## 2019-06-13 DIAGNOSIS — D124 Benign neoplasm of descending colon: Secondary | ICD-10-CM | POA: Diagnosis not present

## 2019-06-13 DIAGNOSIS — D122 Benign neoplasm of ascending colon: Secondary | ICD-10-CM | POA: Diagnosis not present

## 2019-06-13 DIAGNOSIS — D123 Benign neoplasm of transverse colon: Secondary | ICD-10-CM

## 2019-06-13 DIAGNOSIS — K5730 Diverticulosis of large intestine without perforation or abscess without bleeding: Secondary | ICD-10-CM | POA: Diagnosis not present

## 2019-06-13 DIAGNOSIS — R195 Other fecal abnormalities: Secondary | ICD-10-CM

## 2019-06-13 MED ORDER — SODIUM CHLORIDE 0.9 % IV SOLN: 500.0000 mL | Freq: Once | INTRAVENOUS | Status: DC

## 2019-06-13 NOTE — Patient Instructions (Signed)
Discharge instructions given. Handouts on polyps,diverticulosis and hemorrhoids. Resume previous medications. YOU HAD AN ENDOSCOPIC PROCEDURE TODAY AT [facility]:   Refer to the procedure report that was given to you for any specific questions about what was found during the examination.  If the procedure report does not answer your questions, please call your gastroenterologist to clarify.  If you requested that your care partner not be given the details of your procedure findings, then the procedure report has been included in a sealed envelope for you to review at your convenience later.  YOU SHOULD EXPECT: Some feelings of bloating in the abdomen. Passage of more gas than usual.  Walking can help get rid of the air that was put into your GI tract during the procedure and reduce the bloating. If you had a lower endoscopy (such as a colonoscopy or flexible sigmoidoscopy) you may notice spotting of blood in your stool or on the toilet paper. If you underwent a bowel prep for your procedure, you may not have a normal bowel movement for a few days.  Please Note:  You might notice some irritation and congestion in your nose or some drainage.  This is from the oxygen used during your procedure.  There is no need for concern and it should clear up in a day or so.  SYMPTOMS TO REPORT IMMEDIATELY:   Following lower endoscopy (colonoscopy or flexible sigmoidoscopy):  Excessive amounts of blood in the stool  Significant tenderness or worsening of abdominal pains  Swelling of the abdomen that is new, acute  Fever of 100F or higher  For urgent or emergent issues, a gastroenterologist can be reached at any hour by calling [phone]. Do not use MyChart messaging for urgent concerns.    DIET:  We do recommend a small meal at first, but then you may proceed to your regular diet.  Drink plenty of fluids but you should avoid alcoholic beverages for 24 hours.  ACTIVITY:  You should  plan to take it easy for the rest of today and you should NOT DRIVE or use heavy machinery until tomorrow (because of the sedation medicines used during the test).    FOLLOW UP: Our staff will call the number listed on your records 48-72 hours following your procedure to check on you and address any questions or concerns that you may have regarding the information given to you following your procedure. If we do not reach you, we will leave a message.  We will attempt to reach you two times.  During this call, we will ask if you have developed any symptoms of COVID 19. If you develop any symptoms (ie: fever, flu-like symptoms, shortness of breath, cough etc.) before then, please call [phone].  If you test positive for Covid 19 in the 2 weeks post procedure, please call and report this information to [place].    If any biopsies were taken you will be contacted by phone or by letter within the next 1-3 weeks.  Please call us at [phone] if you have not heard about the biopsies in 3 weeks.    SIGNATURES/CONFIDENTIALITY: You and/or your care partner have signed paperwork which will be entered into your electronic medical record.  These signatures attest to the fact that that the information above on your After Visit Summary has been reviewed and is understood.  Full responsibility of the confidentiality of this discharge information lies with you and/or your care-partner.

## 2019-06-13 NOTE — Op Note (Signed)
[employer] Patient Name: Penn Alkins Procedure Date: 06/13/2019 3:14 PM MRN: [id number] Endoscopist: Ladene Artist , MD Age: 74 Referring MD:  Date of Birth: 1945/12/30 Gender: Male Account #: [id number] Procedure:                Colonoscopy Indications:              Positive Cologuard test Medicines:                Monitored Anesthesia Care Procedure:                Pre-Anesthesia Assessment:                           - Prior to the procedure, a History and Physical                            was performed, and patient medications and                            allergies were reviewed. [name] tolerance of                            previous anesthesia was also reviewed. The risks                            and benefits of the procedure and the sedation                            options and risks were discussed with the patient.                            All questions were answered, and informed consent                            was obtained. Prior Anticoagulants: The patient has                            taken no previous anticoagulant or antiplatelet                            agents. ASA Grade Assessment: II - A patient with                            mild systemic disease. After reviewing the risks                            and benefits, the patient was deemed in                            satisfactory condition to undergo the procedure.                           After obtaining informed consent, the colonoscope  was passed under direct vision. Throughout the                            procedure, the patient's blood pressure, pulse, and                            oxygen saturations were monitored continuously. The                            Colonoscope was introduced through the anus and                            advanced to the the cecum, identified by                            appendiceal orifice and ileocecal valve. The                          ileocecal valve, appendiceal orifice, and rectum                            were photographed. The quality of the bowel                            preparation was good. The colonoscopy was performed                            without difficulty. The patient tolerated the                            procedure well. Scope In: 3:20:36 PM Scope Out: 3:37:37 PM Scope Withdrawal Time: 0 hours 15 minutes 9 seconds  Total Procedure Duration: 0 hours 17 minutes 1 second  Findings:                 The perianal and digital rectal examinations were                            normal.                           Six sessile polyps were found in the descending                            colon (1), transverse colon (3) and ascending colon                            (2). The polyps were 3 to 8 mm in size. These                            polyps were removed with a cold snare. Resection                            and retrieval were complete.  Scattered medium-mouthed diverticula were found in                            the right colon. There was no evidence of                            diverticular bleeding.                           Multiple medium-mouthed diverticula were found in                            the left colon. There was narrowing of the colon in                            association with the diverticular opening. There                            was evidence of diverticular spasm. There was no                            evidence of diverticular bleeding.                           Internal hemorrhoids were found during                            retroflexion. The hemorrhoids were small and Grade                            I (internal hemorrhoids that do not prolapse).                           The exam was otherwise without abnormality on                            direct and retroflexion views. Complications:            No immediate  complications. Estimated blood loss:                            None. Estimated Blood Loss:     Estimated blood loss: none. Impression:               - Six 3 to 8 mm polyps in the descending colon, in                            the transverse colon and in the ascending colon,                            removed with a cold snare. Resected and retrieved.                           - Mild diverticulosis in the right colon.                           -  Moderate diverticulosis in the left colon.                           - Internal hemorrhoids.                           - The examination was otherwise normal on direct                            and retroflexion views. Recommendation:           - Repeat colonoscopy after studies are complete for                            surveillance based on pathology results.                           - Patient has a contact number available for                            emergencies. The signs and symptoms of potential                            delayed complications were discussed with the                            patient. Return to normal activities tomorrow.                            Written discharge instructions were provided to the                            patient.                           - High fiber diet.                           - Continue present medications.                           - Await pathology results. Ladene Artist, MD 06/13/2019 3:41:30 PM This report has been signed electronically.

## 2019-06-13 NOTE — Progress Notes (Signed)
Report given to PACU, vss 

## 2019-06-13 NOTE — Progress Notes (Signed)
Called to room to assist during endoscopic procedure.  Patient ID and intended procedure confirmed with present staff. Received instructions for my participation in the procedure from the performing physician.  

## 2019-06-13 NOTE — Progress Notes (Signed)
Pt's states no medical or surgical changes since previsit or office visit.  Vitals- Donna Temp- June 

## 2019-06-15 ENCOUNTER — Telehealth: Payer: Self-pay

## 2019-06-15 NOTE — Telephone Encounter (Signed)
  Follow up Call-  Call back number 06/13/2019  Post procedure Call Back phone  # [phone]  Permission to leave phone message Yes  Some recent data might be hidden     Patient questions:  Do you have a fever, pain , or abdominal swelling? No. Pain Score  0 *  Have you tolerated food without any problems? Yes.    Have you been able to return to your normal activities? Yes.    Do you have any questions about your discharge instructions: Diet   No. Medications  No. Follow up visit  No.  Do you have questions or concerns about your Care? No.  Actions: * If pain score is 4 or above: No action needed, pain <4.  1. Have you developed a fever since your procedure? no  2.   Have you had an respiratory symptoms (SOB or cough) since your procedure? no  3.   Have you tested positive for COVID 19 since your procedure no  4.   Have you had any family members/close contacts diagnosed with the COVID 19 since your procedure?  no   If yes to any of these questions please route to Joylene John, RN and Erenest Rasher, RN

## 2019-06-25 ENCOUNTER — Encounter: Payer: Self-pay | Admitting: Gastroenterology

## 2019-07-04 DIAGNOSIS — L905 Scar conditions and fibrosis of skin: Secondary | ICD-10-CM | POA: Diagnosis not present

## 2019-07-04 DIAGNOSIS — D225 Melanocytic nevi of trunk: Secondary | ICD-10-CM | POA: Diagnosis not present

## 2019-07-04 DIAGNOSIS — L821 Other seborrheic keratosis: Secondary | ICD-10-CM | POA: Diagnosis not present

## 2019-07-04 DIAGNOSIS — Z85828 Personal history of other malignant neoplasm of skin: Secondary | ICD-10-CM | POA: Diagnosis not present

## 2019-07-05 ENCOUNTER — Other Ambulatory Visit: Payer: Self-pay | Admitting: Interventional Cardiology

## 2019-07-10 NOTE — Progress Notes (Signed)
Cardiology Office Note:    Date:  07/11/2019   ID:  Tim Walters, DOB 1945-06-05, MRN [id number]  PCP:  Lauree Chandler, NP  Cardiologist:  No primary care provider on file.   Referring MD: Lauree Chandler, NP   Chief Complaint  Patient presents with  . Coronary Artery Disease    History of Present Illness:    Tim Walters is a 74 y.o. male with a hx of CAD (anterior MI s/p BMS to RCA 2003 with residual LAD/diagonal disease), HTN, HLD,depression, sleep apnea(not using CPAP), GERD, arthritis, MGUS, and pre-diabetes.  Walking 4 to 5 miles per day 7 days/week.  Takes around 1 hour 40 minutes.  He does this prior to breakfast.  No angina or dyspnea has occurred.  He denies palpitations, syncope, claudication, and dizziness.  Past Medical History:  Diagnosis Date  . Arthritis    lt ankle  . Breast lump    Per [facility]   . Colon polyp   . Coronary artery disease    a. BMS to RCA 2003 with residual LAD/diag disease treated medically, normal EF.  Marland Kitchen Depression   . Diverticulosis    Per records from [facility]   . GERD (gastroesophageal reflux disease)   . Hyperkalemia    a. K of 5.2 in 2017.  Marland Kitchen Hyperlipidemia   . Hypertension   . Idiopathic peripheral neuropathy    Per records from [facility]   . Impaired fasting glucose    Per records from [facility]   . Kidney stones   . Monoclonal gammopathy of undetermined significance    Per records from [facility]   . Myocardial infarction ([place]) 11/15/2001   Dr. Daneen Schick [facility])  . OSA (obstructive sleep apnea)    Per [facility] New Patient Packet  . Peripheral neuropathy    Left Foot, Per [place] New Patient Packet  . Pre-diabetes   . Sleep apnea    had test several yr ago-said he did not need a cpap-still snores  . Torn rotator cuff    Per [place] Patient Packet  . Venous insufficiency of left leg    Per [place] Patient Packet  . Wears glasses     Past  Surgical History:  Procedure Laterality Date  . ANGIOPLASTY  2003   Per [facility] from [facility]   . CARDIAC CATHETERIZATION  23003   stent rca  . CARDIOVASCULAR STRESS TEST  02/16/2016   Per records from [facility]   . COLONOSCOPY  06/17/2003   Per records from [facility], Dr.Ganem to be repeated 2015  . coronary artery stent  11/15/2001  . CORONARY STENT PLACEMENT  11/15/2001   Per records from [facility]   . CYST REMOVAL TRUNK Left 05/08/2013   Procedure: CYST REMOVAL BACK;  Surgeon: Adin Hector, MD;  Location: [place];  Service: General;  Laterality: Left;  . INGUINAL HERNIA REPAIR Left 03/24/2015   Procedure: OPEN REPAIR LEFT INGUINAL HERNIA ;  Surgeon: Fanny Skates, MD;  Location: [place];  Service: General;  Laterality: Left;  . INSERTION OF MESH Left 03/24/2015   Procedure: INSERTION OF MESH;  Surgeon: Fanny Skates, MD;  Location: [place];  Service: General;  Laterality: Left;  . SHOULDER ARTHROSCOPY W/ ROTATOR CUFF REPAIR  2011   right  . SPINE SURGERY  02/12/1993   L2, L3 fragmented disc  . VASECTOMY  1987  . VEIN REPAIR  09/2018   Vein injections     Current Medications: Current Meds  Medication Sig  . aspirin 81 MG tablet Take 1 tablet (81 mg total) by mouth daily.  Marland Kitchen atorvastatin (LIPITOR) 80 MG tablet TAKE 1 TABLET BY MOUTH  DAILY  . carvedilol (COREG) 12.5 MG tablet TAKE 1 TABLET BY MOUTH  TWICE DAILY  . Multiple Vitamin (MULTIVITAMIN WITH MINERALS) TABS tablet Take 1 tablet by mouth daily.  . naproxen sodium (ALEVE) 220 MG tablet Take 220 mg by mouth as needed.  Marland Kitchen OMEPRAZOLE PO Take 20 mg by mouth every morning.   . ramipril (ALTACE) 10 MG capsule TAKE 1 CAPSULE BY MOUTH  DAILY  . sertraline (ZOLOFT) 50 MG tablet TAKE 1 TABLET BY MOUTH  DAILY     Allergies:   Patient has no known allergies.   Social History   Socioeconomic History  . Marital status: Married    Spouse name:  Katharine Look  . Number of children: 7  . Years of education: 28  . Highest education level: Not on file  Occupational History  . Occupation: Retired  Tobacco Use  . Smoking status: Former Smoker    Years: 12.00    Types: Cigarettes    Quit date: 02/15/1974    Years since quitting: 45.4  . Smokeless tobacco: Never Used  Substance and Sexual Activity  . Alcohol use: Yes    Alcohol/week: 14.0 standard drinks    Types: 14 Standard drinks or equivalent per week    Comment: 14-21 drinks weekly in the evening   . Drug use: No  . Sexual activity: Not on file  Other Topics Concern  . Not on file  Social History Narrative   Lives with wife, Katharine Look   Caffeine use: Coffee daily   Caffeine free soda      As of 03/28/2018:   Diet: N/A      Caffeine: Yes      Married, if yes what year: Yes, 1971      Do you live in a house, apartment, assisted living, condo, trailer, ect: House, one stories, 2 persons      Pets: 1 cat      Current/Past profession: Dietitian, Chief Strategy Officer      Exercise: Yes, cardio strength 5 days weekly          Living Will: No   DNR: No, would like to discuss   POA/HPOA: Yes      Functional Status:   Do you have difficulty bathing or dressing yourself? No   Do you have difficulty preparing food or eating? No   Do you have difficulty managing your medications? No   Do you have difficulty managing your finances? No   Do you have difficulty affording your medications? No   Social Determinants of Health   Financial Resource Strain:   . Difficulty of Paying Living Expenses:   Food Insecurity:   . Worried About Charity fundraiser in the Last Year:   . Arboriculturist in the Last Year:   Transportation Needs:   . Film/video editor (Medical):   Marland Kitchen Lack of Transportation (Non-Medical):   Physical Activity:   . Days of Exercise per Week:   . Minutes of Exercise per Session:   Stress:   . Feeling of Stress :   Social Connections:   . Frequency of  Communication with Friends and Family:   . Frequency of Social Gatherings with Friends and Family:   . Attends Religious  Services:   . Active Member of Clubs or Organizations:   . Attends Archivist Meetings:   Marland Kitchen Marital Status:      Family History: [name] family history includes Angina in his father; Autism in his son; COPD in his mother; Depression in his father; Diabetes type II in his daughter; Diverticulosis in his father; Heart disease in his father; High Cholesterol in his sister; Hypertension in his father; Lung cancer in his mother; Macular degeneration in his maternal grandmother and mother; Non-Hodgkin's lymphoma in his mother; Post-traumatic stress disorder in his son; Suicidality (age of onset: 54) in his father. There is no history of Breast cancer, Colon cancer, Esophageal cancer, Stomach cancer, Liver disease, or Pancreatic cancer.  ROS:   Please see the history of present illness.    Somewhat concerned about blood pressures.  He notes that they are great after he walks.  Later in the day he can have systolic pressures greater than [id number] mmHg systolic..  All other systems reviewed and are negative.  EKGs/Labs/Other Studies Reviewed:    The following studies were reviewed today: He has had no cardiac imaging.  EKG:  EKG normal sinus rhythm, poor R wave progression V1 through V4, PACs are noted.  Otherwise normal.  Recent Labs: 05/09/2019: ALT 23; BUN 21; Creat 0.85; Hemoglobin 14.2; Platelets 182; Potassium 4.4; Sodium 139  Recent Lipid Panel    Component Value Date/Time   CHOL 147 05/09/2019 0825   TRIG 41 05/09/2019 0825   HDL 63 05/09/2019 0825   CHOLHDL 2.3 05/09/2019 0825   LDLCALC 72 05/09/2019 0825    Physical Exam:    VS:  BP (!) 148/74   Pulse 64   Ht 5\' 7"  (1.702 m)   Wt 200 lb 6.4 oz (90.9 kg)   SpO2 96%   BMI 31.39 kg/m     Wt Readings from Last 3 Encounters:  07/11/19 200 lb 6.4 oz (90.9 kg)  06/13/19 201 lb (91.2 kg)  05/31/19  201 lb (91.2 kg)     GEN: Healthy-appearing. No acute distress HEENT: Normal NECK: No JVD. LYMPHATICS: No lymphadenopathy CARDIAC:  RRR without murmur, gallop, or edema. VASCULAR:  Normal Pulses. No bruits. RESPIRATORY:  Clear to auscultation without rales, wheezing or rhonchi  ABDOMEN: Soft, non-tender, non-distended, No pulsatile mass, MUSCULOSKELETAL: No deformity  SKIN: Warm and dry NEUROLOGIC:  Alert and oriented x 3 PSYCHIATRIC:  Normal affect   ASSESSMENT:    1. CAD in native artery   2. Essential hypertension   3. Hyperlipidemia, unspecified hyperlipidemia type   4. Sleep apnea, unspecified type   5. Educated about COVID-19 virus infection    PLAN:    In order of problems listed above:  1. Stable without angina.  Secondary prevention discussed. 2. Target blood pressure is 130/80.  Current is higher than we would like.  He will record some blood pressures at home 4 to 5 hours after his morning medications and report.  Adjustment in therapy may be necessary and could include adding low-dose diuretic therapy. 3. Target LDL less than 70.  Most recent LDL 72 in March.  Continue high intensity statin therapy. 4. Not discussed 5. COVID-19 vaccine has been received.  Social distancing is being practiced.  Overall education and awareness concerning primary/secondary risk prevention was discussed in detail: LDL less than 70, hemoglobin A1c less than 7, blood pressure target less than 130/80 mmHg, >150 minutes of moderate aerobic activity per week, avoidance of smoking, weight control (via diet  and exercise), and continued surveillance/management of/for obstructive sleep apnea.    Medication Adjustments/Labs and Tests Ordered: Current medicines are reviewed at length with the patient today.  Concerns regarding medicines are outlined above.  Orders Placed This Encounter  Procedures  . EKG 12-Lead   No orders of the defined types were placed in this encounter.   Patient  Instructions  Medication Instructions:  Your physician recommends that you continue on your current medications as directed. Please refer to the Current Medication list given to you today.  *If you need a refill on your cardiac medications before your next appointment, please call your pharmacy*   Lab Work: None If you have labs (blood work) drawn today and your tests are completely normal, you will receive your results only by: Marland Kitchen MyChart Message (if you have MyChart) OR . A paper copy in the mail If you have any lab test that is abnormal or we need to change your treatment, we will call you to review the results.   Testing/Procedures: None   Follow-Up: At [facility], you and your health needs are our priority.  As part of our continuing mission to provide you with exceptional heart care, we have created designated Provider Care Teams.  These Care Teams include your primary Cardiologist (physician) and Advanced Practice Providers (APPs -  Physician Assistants and Nurse Practitioners) who all work together to provide you with the care you need, when you need it.  We recommend signing up for the patient portal called "MyChart".  Sign up information is provided on this After Visit Summary.  MyChart is used to connect with patients for Virtual Visits (Telemedicine).  Patients are able to view lab/test results, encounter notes, upcoming appointments, etc.  Non-urgent messages can be sent to your provider as well.   To learn more about what you can do with MyChart, go to [email or web address].    Your next appointment:   12 month(s)  The format for your next appointment:   In Person  Provider:   You may see Dr. Daneen Schick or one of the following Advanced Practice Providers on your designated Care Team:    Truitt Merle, NP  Cecilie Kicks, NP  Kathyrn Drown, NP    Other Instructions      Signed, Sinclair Grooms, MD  07/11/2019 5:24 PM    [place]

## 2019-07-11 ENCOUNTER — Ambulatory Visit: Payer: Medicare Other | Admitting: Interventional Cardiology

## 2019-07-11 ENCOUNTER — Other Ambulatory Visit: Payer: Self-pay

## 2019-07-11 ENCOUNTER — Encounter: Payer: Self-pay | Admitting: Interventional Cardiology

## 2019-07-11 VITALS — BP 148/74 | HR 64 | Ht 67.0 in | Wt 200.4 lb

## 2019-07-11 DIAGNOSIS — I2510 Atherosclerotic heart disease of native coronary artery without angina pectoris: Secondary | ICD-10-CM | POA: Diagnosis not present

## 2019-07-11 DIAGNOSIS — Z7189 Other specified counseling: Secondary | ICD-10-CM | POA: Diagnosis not present

## 2019-07-11 DIAGNOSIS — E785 Hyperlipidemia, unspecified: Secondary | ICD-10-CM | POA: Diagnosis not present

## 2019-07-11 DIAGNOSIS — G4730 Sleep apnea, unspecified: Secondary | ICD-10-CM

## 2019-07-11 DIAGNOSIS — I10 Essential (primary) hypertension: Secondary | ICD-10-CM

## 2019-07-11 NOTE — Patient Instructions (Signed)
Medication Instructions:  Your physician recommends that you continue on your current medications as directed. Please refer to the Current Medication list given to you today.  *If you need a refill on your cardiac medications before your next appointment, please call your pharmacy*   Lab Work: None If you have labs (blood work) drawn today and your tests are completely normal, you will receive your results only by: . MyChart Message (if you have MyChart) OR . A paper copy in the mail If you have any lab test that is abnormal or we need to change your treatment, we will call you to review the results.   Testing/Procedures: None   Follow-Up: At [facility], you and your health needs are our priority.  As part of our continuing mission to provide you with exceptional heart care, we have created designated Provider Care Teams.  These Care Teams include your primary Cardiologist (physician) and Advanced Practice Providers (APPs -  Physician Assistants and Nurse Practitioners) who all work together to provide you with the care you need, when you need it.  We recommend signing up for the patient portal called "MyChart".  Sign up information is provided on this After Visit Summary.  MyChart is used to connect with patients for Virtual Visits (Telemedicine).  Patients are able to view lab/test results, encounter notes, upcoming appointments, etc.  Non-urgent messages can be sent to your provider as well.   To learn more about what you can do with MyChart, go to [email or web address].    Your next appointment:   12 month(s)  The format for your next appointment:   In Person  Provider:   You may see Dr. Henry Smith or one of the following Advanced Practice Providers on your designated Care Team:    Lori Gerhardt, NP  Laura Ingold, NP  Jill McDaniel, NP    Other Instructions   

## 2019-09-03 DIAGNOSIS — E119 Type 2 diabetes mellitus without complications: Secondary | ICD-10-CM | POA: Diagnosis not present

## 2019-09-06 ENCOUNTER — Other Ambulatory Visit: Payer: Self-pay | Admitting: Interventional Cardiology

## 2019-10-01 ENCOUNTER — Other Ambulatory Visit: Payer: Self-pay | Admitting: Interventional Cardiology

## 2019-10-01 ENCOUNTER — Other Ambulatory Visit: Payer: Self-pay | Admitting: Nurse Practitioner

## 2019-11-08 ENCOUNTER — Other Ambulatory Visit: Payer: Self-pay

## 2019-11-08 ENCOUNTER — Other Ambulatory Visit: Payer: Medicare Other

## 2019-11-08 DIAGNOSIS — R739 Hyperglycemia, unspecified: Secondary | ICD-10-CM

## 2019-11-08 DIAGNOSIS — I25118 Atherosclerotic heart disease of native coronary artery with other forms of angina pectoris: Secondary | ICD-10-CM

## 2019-11-08 DIAGNOSIS — E785 Hyperlipidemia, unspecified: Secondary | ICD-10-CM | POA: Diagnosis not present

## 2019-11-09 LAB — COMPLETE METABOLIC PANEL WITH GFR
AG Ratio: 1.4 (calc) (ref 1.0–2.5)
ALT: 21 U/L (ref 9–46)
AST: 20 U/L (ref 10–35)
Albumin: 4 g/dL (ref 3.6–5.1)
Alkaline phosphatase (APISO): 65 U/L (ref 35–144)
BUN: 24 mg/dL (ref 7–25)
CO2: 29 mmol/L (ref 20–32)
Calcium: 9.2 mg/dL (ref 8.6–10.3)
Chloride: 106 mmol/L (ref 98–110)
Creat: 0.89 mg/dL (ref 0.70–1.18)
GFR, Est African American: 97 mL/min/{1.73_m2} (ref >=60)
GFR, Est Non African American: 84 mL/min/{1.73_m2} (ref >=60)
Globulin: 2.8 g/dL (calc) (ref 1.9–3.7)
Glucose, Bld: 125 mg/dL — ABNORMAL HIGH (ref 65–99)
Potassium: 4.6 mmol/L (ref 3.5–5.3)
Sodium: 142 mmol/L (ref 135–146)
Total Bilirubin: 0.6 mg/dL (ref 0.2–1.2)
Total Protein: 6.8 g/dL (ref 6.1–8.1)

## 2019-11-09 LAB — LIPID PANEL
Cholesterol: 140 mg/dL (ref <200)
HDL: 58 mg/dL (ref >=40)
LDL Cholesterol (Calc): 68 mg/dL (calc)
Non-HDL Cholesterol (Calc): 82 mg/dL (calc) (ref <130)
Total CHOL/HDL Ratio: 2.4 (calc) (ref <5.0)
Triglycerides: 56 mg/dL (ref <150)

## 2019-11-09 LAB — HEMOGLOBIN A1C
Hgb A1c MFr Bld: 5.9 % of total Hgb — ABNORMAL HIGH (ref <5.7)
Mean Plasma Glucose: 123 (calc)
eAG (mmol/L): 6.8 (calc)

## 2019-11-12 ENCOUNTER — Other Ambulatory Visit: Payer: Self-pay

## 2019-11-12 ENCOUNTER — Encounter: Payer: Self-pay | Admitting: Nurse Practitioner

## 2019-11-12 ENCOUNTER — Ambulatory Visit (INDEPENDENT_AMBULATORY_CARE_PROVIDER_SITE_OTHER): Payer: Medicare Other | Admitting: Nurse Practitioner

## 2019-11-12 VITALS — BP 140/80 | HR 57 | Temp 97.5°F | Ht 67.0 in | Wt 200.0 lb

## 2019-11-12 DIAGNOSIS — G4733 Obstructive sleep apnea (adult) (pediatric): Secondary | ICD-10-CM

## 2019-11-12 DIAGNOSIS — I10 Essential (primary) hypertension: Secondary | ICD-10-CM | POA: Diagnosis not present

## 2019-11-12 DIAGNOSIS — I25118 Atherosclerotic heart disease of native coronary artery with other forms of angina pectoris: Secondary | ICD-10-CM | POA: Diagnosis not present

## 2019-11-12 DIAGNOSIS — R739 Hyperglycemia, unspecified: Secondary | ICD-10-CM | POA: Diagnosis not present

## 2019-11-12 DIAGNOSIS — E785 Hyperlipidemia, unspecified: Secondary | ICD-10-CM

## 2019-11-12 NOTE — Progress Notes (Signed)
Careteam: Patient Care Team: Lauree Chandler, NP as PCP - General (Geriatric Medicine) Fanny Skates, MD as Consulting Physician (General Surgery) Marchia Bond, MD as Consulting Physician (Orthopedic Surgery) Jarome Matin, MD as Consulting Physician (Dermatology) Belva Crome, MD as Consulting Physician (Cardiology) Dyke Maes, OD as Referring Physician (Optometry) Kathrynn Ducking, MD as Consulting Physician (Neurology)  PLACE OF SERVICE:  [place] Directive information Does Patient Have a Medical Advance Directive?: Yes, Type of Advance Directive: Out of facility DNR (pink MOST or yellow form), Pre-existing out of facility DNR order (yellow form or pink MOST form): Pink MOST form placed in chart (order not valid for inpatient use), Does patient want to make changes to medical advance directive?: No - Patient declined  No Known Allergies  Chief Complaint  Patient presents with  . Medical Management of Chronic Issues    6 month folloow up visit. Patient states that everything has been good.     HPI: Patient is a 74 y.o. male for routine follow up  CAD- followed with cardiologist in may   HTN- white coat syndrome- sent blood pressure readings to cardiologist in June rang from 113-130/64-78  Walks 4 miles every morning before breakfast  Depression- in remission on zoloft 50 mg daily  GERD-controlled on omeprazole. Rare breakthrough  Had neck pain could not move head - went to PT and had 3 treatments   Hyperlipidemia- cholesterol at goal on Lipitor without strict dietary modifications.   Positive cologuard- went for colonoscopy, polyps found, follow up colonoscopy in 3 years.   Review of Systems:  Review of Systems  Constitutional: Negative for chills, fever and weight loss.  HENT: Negative for tinnitus.   Respiratory: Negative for cough, sputum production and shortness of breath.   Cardiovascular: Negative for chest pain, palpitations and  leg swelling.  Gastrointestinal: Negative for abdominal pain, constipation, diarrhea and heartburn.  Genitourinary: Negative for dysuria, frequency and urgency.  Musculoskeletal: Negative for back pain, falls, joint pain and myalgias.  Skin: Negative.   Neurological: Negative for dizziness and headaches.  Psychiatric/Behavioral: Negative for depression and memory loss. The patient does not have insomnia.     Past Medical History:  Diagnosis Date  . Arthritis    lt ankle  . Breast lump    Per records from [facility]   . Colon polyp   . Coronary artery disease    a. BMS to RCA 2003 with residual LAD/diag disease treated medically, normal EF.  Marland Kitchen Depression   . Diverticulosis    Per records from [facility]   . GERD (gastroesophageal reflux disease)   . Hyperkalemia    a. K of 5.2 in 2017.  Marland Kitchen Hyperlipidemia   . Hypertension   . Idiopathic peripheral neuropathy    Per records from [facility]   . Impaired fasting glucose    Per [facility] from [facility]   . Kidney stones   . Monoclonal gammopathy of undetermined significance    Per [facility]   . Myocardial infarction ([place]) 11/15/2001   Dr. Daneen Schick [facility])  . OSA (obstructive sleep apnea)    Per [facility] New Patient Packet  . Peripheral neuropathy    Left Foot, Per [place] New Patient Packet  . Pre-diabetes   . Sleep apnea    had test several yr ago-said he did not need a cpap-still snores  . Torn rotator cuff    Per [place] Patient Packet  . Venous  insufficiency of left leg    Per [place] Patient Packet  . Wears glasses    Past Surgical History:  Procedure Laterality Date  . ANGIOPLASTY  2003   Per records from [facility]   . CARDIAC CATHETERIZATION  23003   stent rca  . CARDIOVASCULAR STRESS TEST  02/16/2016   Per records from [facility]   . COLONOSCOPY  06/17/2003   Per records from [facility], Dr.Ganem to be repeated 2015  . coronary artery stent   11/15/2001  . CORONARY STENT PLACEMENT  11/15/2001   Per records from [facility]   . CYST REMOVAL TRUNK Left 05/08/2013   Procedure: CYST REMOVAL BACK;  Surgeon: Adin Hector, MD;  Location: [place];  Service: General;  Laterality: Left;  . INGUINAL HERNIA REPAIR Left 03/24/2015   Procedure: OPEN REPAIR LEFT INGUINAL HERNIA ;  Surgeon: Fanny Skates, MD;  Location: [place];  Service: General;  Laterality: Left;  . INSERTION OF MESH Left 03/24/2015   Procedure: INSERTION OF MESH;  Surgeon: Fanny Skates, MD;  Location: [place];  Service: General;  Laterality: Left;  . SHOULDER ARTHROSCOPY W/ ROTATOR CUFF REPAIR  2011   right  . SPINE SURGERY  02/12/1993   L2, L3 fragmented disc  . VASECTOMY  1987  . VEIN REPAIR  09/2018   Vein injections    Social History:   reports that he quit smoking about 45 years ago. His smoking use included cigarettes. He quit after 12.00 years of use. He has never used smokeless tobacco. He reports current alcohol use of about 14.0 standard drinks of alcohol per week. He reports that he does not use drugs.  Family History  Problem Relation Age of Onset  . Macular degeneration Mother   . COPD Mother   . Non-Hodgkin's lymphoma Mother   . Lung cancer Mother        Per [facility] New Patient Packet   . Hypertension Father   . Suicidality Father 34       Per [place] New Patient Packet   . Depression Father   . Angina Father        Per records [facility]   . Diverticulosis Father        Per records from [facility]   . Heart disease Father   . High Cholesterol Sister   . Macular degeneration Maternal Grandmother   . Autism Son   . Post-traumatic stress disorder Son   . Diabetes type II Daughter   . Breast cancer Neg Hx   . Colon cancer Neg Hx   . Esophageal cancer Neg Hx   . Stomach cancer Neg Hx   . Liver disease Neg Hx   . Pancreatic cancer Neg Hx     Medications: Patient's  Medications  New Prescriptions   No medications on file  Previous Medications   ASPIRIN 81 MG TABLET    Take 1 tablet (81 mg total) by mouth daily.   ATORVASTATIN (LIPITOR) 80 MG TABLET    TAKE 1 TABLET BY MOUTH  DAILY   CARVEDILOL (COREG) 12.5 MG TABLET    TAKE 1 TABLET BY MOUTH  TWICE DAILY   MULTIPLE VITAMIN (MULTIVITAMIN WITH MINERALS) TABS TABLET    Take 1 tablet by mouth daily.   NAPROXEN SODIUM (ALEVE) 220 MG TABLET    Take 220 mg by mouth as needed.   OMEPRAZOLE PO    Take 20 mg by mouth every  morning.    RAMIPRIL (ALTACE) 10 MG CAPSULE    TAKE 1 CAPSULE BY MOUTH  DAILY   SERTRALINE (ZOLOFT) 50 MG TABLET    TAKE 1 TABLET BY MOUTH  DAILY  Modified Medications   No medications on file  Discontinued Medications   No medications on file    Physical Exam:  Vitals:   11/12/19 1107  BP: 140/80  Pulse: (!) 57  Temp: (!) 97.5 F (36.4 C)  TempSrc: Temporal  SpO2: 97%  Weight: 200 lb (90.7 kg)  Height: 5\' 7"  (1.702 m)   Body mass index is 31.32 kg/m. Wt Readings from Last 3 Encounters:  11/12/19 200 lb (90.7 kg)  07/11/19 200 lb 6.4 oz (90.9 kg)  06/13/19 201 lb (91.2 kg)    Physical Exam Constitutional:      General: He is not in acute distress.    Appearance: He is well-developed. He is not diaphoretic.  HENT:     Head: Normocephalic and atraumatic.     Mouth/Throat:     Pharynx: No oropharyngeal exudate.  Eyes:     Conjunctiva/sclera: Conjunctivae normal.     Pupils: Pupils are equal, round, and reactive to light.  Cardiovascular:     Rate and Rhythm: Normal rate and regular rhythm.     Heart sounds: Normal heart sounds.  Pulmonary:     Effort: Pulmonary effort is normal.     Breath sounds: Normal breath sounds.  Abdominal:     General: Bowel sounds are normal.     Palpations: Abdomen is soft.  Musculoskeletal:        General: No tenderness.     Cervical back: Normal range of motion and neck supple.  Skin:    General: Skin is warm and dry.    Neurological:     Mental Status: He is alert and oriented to person, place, and time.     Labs reviewed: Basic Metabolic Panel: Recent Labs    05/09/19 0825 11/08/19 0829  NA 139 142  K 4.4 4.6  CL 107 106  CO2 26 29  GLUCOSE 119* 125*  BUN 21 24  CREATININE 0.85 0.89  CALCIUM 9.0 9.2   Liver Function Tests: Recent Labs    05/09/19 0825 11/08/19 0829  AST 22 20  ALT 23 21  BILITOT 0.6 0.6  PROT 6.6 6.8   No results for input(s): LIPASE, AMYLASE in the last 8760 hours. No results for input(s): AMMONIA in the last 8760 hours. CBC: Recent Labs    05/09/19 0825  WBC 6.0  NEUTROABS 3,936  HGB 14.2  HCT 43.0  MCV 94.7  PLT 182   Lipid Panel: Recent Labs    05/09/19 0825 11/08/19 0829  CHOL 147 140  HDL 63 58  LDLCALC 72 68  TRIG 41 56  CHOLHDL 2.3 2.4   TSH: No results for input(s): TSH in the last 8760 hours. A1C: Lab Results  Component Value Date   HGBA1C 5.9 (H) 11/08/2019     Assessment/Plan 1. Obstructive sleep apnea Does not use CPAP, could not tolerate.  3. Hyperlipidemia, unspecified hyperlipidemia type -controlled on lipitor 80 mg daily - Lipid panel; Future  4. Essential hypertension, benign -home reading stable, will continue current medication coreg 12.5 mg BID with ramipril 10 mg daily  - CBC with Differential/Platelet; Future - COMPLETE METABOLIC PANEL WITH GFR; Future  5. Atherosclerosis of native coronary artery of native heart with stable angina pectoris ([place]) Stable, continues to follow up with cardiologist, without  chest pain, continues on ASA 81 mg daily  6. Hyperglycemia -continue on dietary modifications.  - Hemoglobin A1c; Future   Next appt: 6 months labs prior to visit  Azariya Freeman K. [place], [facility] [phone]

## 2019-12-20 ENCOUNTER — Encounter: Payer: Self-pay | Admitting: Nurse Practitioner

## 2019-12-20 NOTE — Telephone Encounter (Signed)
Lauree Chandler, NP is out of office this week, message sent to covering provider

## 2019-12-24 ENCOUNTER — Telehealth: Payer: Self-pay | Admitting: Interventional Cardiology

## 2019-12-24 NOTE — Telephone Encounter (Signed)
Agree this is vertigo sounding. Is not likely related to BP.  BP appears high prior to morning walk. Continue to monitor ans if persistent, may need to add Chlorthalidone 12.5 mg daily with 7-10 day BMET.

## 2019-12-24 NOTE — Telephone Encounter (Signed)
Patient calling to speak with nurse about his mychart messages regarding his dizziness.

## 2019-12-24 NOTE — Telephone Encounter (Signed)
Spoke with pt and made him aware of information from Dr. Tamala Julian.  Advised to continue to monitor BP and send those readings over in MyChart in 7-10 days and we will see what his numbers look like and start the Chlorthalidone if appropriate.  Pt verbalized understanding and was in agreement with plan.

## 2019-12-24 NOTE — Telephone Encounter (Signed)
  Below are the MyChart messages sent by the pt.  Pt states dizziness improved since he sent messages but has since returned today.  Mostly occurs when moving head side to side and walking.  Vitals yesterday morning were 183/55 and then dropped to 125/76 after his walk.  This morning BP was 156/99 and then after walk was 134/80.  Advised I will send message to Dr. Tamala Julian for review but to also reach out to PCP as this sounds like it may be more related to vertigo.  Pt agreeable.   Liliana Cline to Belva Crome, MD     5:50 PM No chest pain, shortness of breath, fainting, nausea or vomiting.  Walk at least 4 miles every morning with ease before breakfast. Weight 198. Have never been diagnosed with vertigo.  Take Carvedilol, Atorvastatin, Sertraline and 81 mg aspirin at bedtime. Take Ramipril, Carvedilol,  Multi-Vitamin and Omeprazole upon waking in the morning. Thora Lance, RN to Liliana Cline     3:10 PM Good Afternoon Mr Scheff,  Thank you for reaching out to [place]. I have just a few additional questions for you. Have you had any chest pain, shortness of breath, fainting nausea or vomiting?  Have you ever been diagnosed with vertigo before?  Are you taking your medications as prescribed?  Thank you,  Dan Europe  Last read by Liliana Cline at 7:37 AM on 12/23/2019. Liliana Cline to Belva Crome, MD     2:54 PM For the past week, I have experienced some dizziness. Noticed it first on my morning walk about a week ago when I felt unsteady on my feet but I was able to walk 4 miles. By mid day the unsteady feeling was a lot less noticeable gone but the dizzy feeling would come back when I turned my head side to side. The next morning upon waking and still in bed, the room was spinning and on my morning walk I felt unsteady. Yesterday morning I walked 6 miles and felt normal. Last night while lying in bed reading, the room started spinning. This morning I felt a  little off but no problem walking. I started taking my blood pressure on 11/1 and it has been unusually high first thing in the morning - 177/100, 168/96, 150/93 and 163/102 for the past 4 days. After walking, 125/77, 134/80, 130/83 and 123/80.  Evenings it has been in the 135 to 142/ 73 to 79 range. Any thoughts on this?

## 2019-12-26 ENCOUNTER — Encounter: Payer: Self-pay | Admitting: Nurse Practitioner

## 2019-12-26 ENCOUNTER — Other Ambulatory Visit: Payer: Self-pay | Admitting: Nurse Practitioner

## 2019-12-26 ENCOUNTER — Other Ambulatory Visit: Payer: Self-pay

## 2019-12-26 ENCOUNTER — Ambulatory Visit (INDEPENDENT_AMBULATORY_CARE_PROVIDER_SITE_OTHER): Payer: Medicare Other | Admitting: Nurse Practitioner

## 2019-12-26 VITALS — BP 140/82 | HR 55 | Temp 96.9°F | Ht 67.0 in | Wt 193.0 lb

## 2019-12-26 DIAGNOSIS — R42 Dizziness and giddiness: Secondary | ICD-10-CM

## 2019-12-26 NOTE — Progress Notes (Signed)
Careteam: Patient Care Team: Lauree Chandler, NP as PCP - General (Geriatric Medicine) Fanny Skates, MD as Consulting Physician (General Surgery) Marchia Bond, MD as Consulting Physician (Orthopedic Surgery) Jarome Matin, MD as Consulting Physician (Dermatology) Belva Crome, MD as Consulting Physician (Cardiology) Dyke Maes, OD as Referring Physician (Optometry) Kathrynn Ducking, MD as Consulting Physician (Neurology)  PLACE OF SERVICE:  [place] Directive information    No Known Allergies  Chief Complaint  Patient presents with  . Acute Visit    Patient c/o vertigo x 2 weeks and elevated b/p in the am (cardiology, Dr.Smith, Mallie Mussel is addressing)      HPI: Patient is a 74 y.o. male due to dizziness and elevated blood pressure  Reports he takes blood pressure about 1 hour after medication  168/80 this morning then it was 114/70 after he walked.  Reports overall eating less and having less ETOH drinks.   Denies headache. Reports he was walking one more and was feeling unsteady and so he went home and checked BP and it was high.  Taking it three times daily and it was high in the morning.   No nasal congestion, no recent colds.  No runny nose. No blurred vision.  Reports he feels like the room is spinning while reading last night.  Did epley maneuver and it helped  Reports he was a little dizzy this morning and then walking around "like a drunk" Dizziness is worse when he turns his head.  No new medications or supplements.  No changes in medication.     Review of Systems:  Review of Systems  Constitutional: Negative for chills, fever and weight loss.  HENT: Negative for tinnitus.   Respiratory: Negative for cough, sputum production and shortness of breath.   Cardiovascular: Negative for chest pain, palpitations and leg swelling.  Gastrointestinal: Negative for abdominal pain, constipation, diarrhea and heartburn.  Genitourinary:  Negative for dysuria, frequency and urgency.  Musculoskeletal: Negative for back pain, falls, joint pain and myalgias.  Skin: Negative.   Neurological: Positive for dizziness. Negative for weakness and headaches.  Psychiatric/Behavioral: Negative for depression and memory loss. The patient does not have insomnia.     Past Medical History:  Diagnosis Date  . Arthritis    lt ankle  . Breast lump    Per records from [facility]   . Colon polyp   . Coronary artery disease    a. BMS to RCA 2003 with residual LAD/diag disease treated medically, normal EF.  Marland Kitchen Depression   . Diverticulosis    Per records from [facility]   . GERD (gastroesophageal reflux disease)   . Hyperkalemia    a. K of 5.2 in 2017.  Marland Kitchen Hyperlipidemia   . Hypertension   . Idiopathic peripheral neuropathy    Per records from [facility]   . Impaired fasting glucose    Per records from [facility]   . Kidney stones   . Monoclonal gammopathy of undetermined significance    Per records from [facility]   . Myocardial infarction ([place]) 11/15/2001   Dr. Daneen Schick [facility])  . OSA (obstructive sleep apnea)    Per [facility] New Patient Packet  . Peripheral neuropathy    Left Foot, Per [place] New Patient Packet  . Pre-diabetes   . Sleep apnea    had test several yr ago-said he did not need a cpap-still snores  . Torn rotator cuff    Per [place]  Patient Packet  . Venous insufficiency of left leg    Per [place] Patient Packet  . Wears glasses    Past Surgical History:  Procedure Laterality Date  . ANGIOPLASTY  2003   Per records from [facility]   . CARDIAC CATHETERIZATION  23003   stent rca  . CARDIOVASCULAR STRESS TEST  02/16/2016   Per records [facility]   . COLONOSCOPY  06/17/2003   Per records from [facility], Dr.Ganem to be repeated 2015  . coronary artery stent  11/15/2001  . CORONARY STENT PLACEMENT  11/15/2001   Per records from [facility]   .  CYST REMOVAL TRUNK Left 05/08/2013   Procedure: CYST REMOVAL BACK;  Surgeon: Adin Hector, MD;  Location: [place];  Service: General;  Laterality: Left;  . INGUINAL HERNIA REPAIR Left 03/24/2015   Procedure: OPEN REPAIR LEFT INGUINAL HERNIA ;  Surgeon: Fanny Skates, MD;  Location: [place];  Service: General;  Laterality: Left;  . INSERTION OF MESH Left 03/24/2015   Procedure: INSERTION OF MESH;  Surgeon: Fanny Skates, MD;  Location: [place];  Service: General;  Laterality: Left;  . SHOULDER ARTHROSCOPY W/ ROTATOR CUFF REPAIR  2011   right  . SPINE SURGERY  02/12/1993   L2, L3 fragmented disc  . VASECTOMY  1987  . VEIN REPAIR  09/2018   Vein injections    Social History:   reports that he quit smoking about 45 years ago. His smoking use included cigarettes. He quit after 12.00 years of use. He has never used smokeless tobacco. He reports current alcohol use of about 14.0 standard drinks of alcohol per week. He reports that he does not use drugs.  Family History  Problem Relation Age of Onset  . Macular degeneration Mother   . COPD Mother   . Non-Hodgkin's lymphoma Mother   . Lung cancer Mother        Per [facility] New Patient Packet   . Hypertension Father   . Suicidality Father 94       Per [place] New Patient Packet   . Depression Father   . Angina Father        Per records from [facility]   . Diverticulosis Father        Per records from [facility]   . Heart disease Father   . High Cholesterol Sister   . Macular degeneration Maternal Grandmother   . Schizophrenia Son   . Bipolar disorder Son   . Autism Son   . Post-traumatic stress disorder Son   . Diabetes type II Daughter   . Breast cancer Neg Hx   . Colon cancer Neg Hx   . Esophageal cancer Neg Hx   . Stomach cancer Neg Hx   . Liver disease Neg Hx   . Pancreatic cancer Neg Hx     Medications: Patient's Medications  New Prescriptions   No medications on  file  Previous Medications   ASPIRIN 81 MG TABLET    Take 1 tablet (81 mg total) by mouth daily.   ATORVASTATIN (LIPITOR) 80 MG TABLET    TAKE 1 TABLET BY MOUTH  DAILY   CARVEDILOL (COREG) 12.5 MG TABLET    TAKE 1 TABLET BY MOUTH  TWICE DAILY   MULTIPLE VITAMIN (MULTIVITAMIN WITH MINERALS) TABS TABLET    Take 1 tablet by mouth daily.   NAPROXEN SODIUM (ALEVE) 220 MG TABLET    Take 220 mg by  mouth as needed.   OMEPRAZOLE PO    Take 20 mg by mouth every morning.    RAMIPRIL (ALTACE) 10 MG CAPSULE    TAKE 1 CAPSULE BY MOUTH  DAILY   SERTRALINE (ZOLOFT) 50 MG TABLET    TAKE 1 TABLET BY MOUTH  DAILY  Modified Medications   No medications on file  Discontinued Medications   No medications on file    Physical Exam:  Vitals:   12/26/19 1030  BP: 140/82  Pulse: (!) 55  Temp: (!) 96.9 F (36.1 C)  TempSrc: Temporal  SpO2: 99%  Weight: 193 lb (87.5 kg)  Height: 5\' 7"  (1.702 m)   Body mass index is 30.23 kg/m. Wt Readings from Last 3 Encounters:  12/26/19 193 lb (87.5 kg)  11/12/19 200 lb (90.7 kg)  07/11/19 200 lb 6.4 oz (90.9 kg)    Physical Exam Constitutional:      General: He is not in acute distress.    Appearance: He is well-developed. He is not diaphoretic.  HENT:     Head: Normocephalic and atraumatic.     Right Ear: Tympanic membrane, ear canal and external ear normal.     Left Ear: Tympanic membrane, ear canal and external ear normal.     Nose: Nose normal. No congestion or rhinorrhea.     Mouth/Throat:     Pharynx: No oropharyngeal exudate.  Eyes:     Extraocular Movements: Extraocular movements intact.     Conjunctiva/sclera: Conjunctivae normal.     Pupils: Pupils are equal, round, and reactive to light.  Cardiovascular:     Rate and Rhythm: Normal rate and regular rhythm.     Heart sounds: Normal heart sounds.  Pulmonary:     Effort: Pulmonary effort is normal.     Breath sounds: Normal breath sounds.  Abdominal:     General: Bowel sounds are normal.      Palpations: Abdomen is soft.  Musculoskeletal:        General: No tenderness.     Cervical back: Normal range of motion and neck supple.  Skin:    General: Skin is warm and dry.  Neurological:     General: No focal deficit present.     Mental Status: He is alert and oriented to person, place, and time.     Cranial Nerves: No cranial nerve deficit.     Sensory: No sensory deficit.     Motor: No weakness.     Coordination: Coordination normal.     Gait: Gait normal.     Deep Tendon Reflexes: Reflexes normal.  Psychiatric:        Mood and Affect: Mood normal.        Behavior: Behavior normal.     Labs reviewed: Basic Metabolic Panel: Recent Labs    05/09/19 0825 11/08/19 0829  NA 139 142  K 4.4 4.6  CL 107 106  CO2 26 29  GLUCOSE 119* 125*  BUN 21 24  CREATININE 0.85 0.89  CALCIUM 9.0 9.2   Liver Function Tests: Recent Labs    05/09/19 0825 11/08/19 0829  AST 22 20  ALT 23 21  BILITOT 0.6 0.6  PROT 6.6 6.8   No results for input(s): LIPASE, AMYLASE in the last 8760 hours. No results for input(s): AMMONIA in the last 8760 hours. CBC: Recent Labs    05/09/19 0825  WBC 6.0  NEUTROABS 3,936  HGB 14.2  HCT 43.0  MCV 94.7  PLT 182   Lipid Panel:  Recent Labs    05/09/19 0825 11/08/19 0829  CHOL 147 140  HDL 63 58  LDLCALC 72 68  TRIG 41 56  CHOLHDL 2.3 2.4   TSH: No results for input(s): TSH in the last 8760 hours. A1C: Lab Results  Component Value Date   HGBA1C 5.9 (H) 11/08/2019     Assessment/Plan 1. Vertigo -neuro exam WNL during assessment -will get lab work today -encouraged to stay hydrated - Slowly change position of head and slowly get up from sitting to standing Can take meclizine 12.5-25 mg by mouth every 8 hours as needed for dizziness Okay to continue to try the epley maneuver if vertigo persist we can refer you to PT for vestibular rehab.  - CBC with Differential/Platelet - BASIC METABOLIC PANEL WITH GFR  2.  Hypertension -stable at this time, pt monitoring closely at this time and bring blood pressure log to cardiologist for follow up. Will continue current regimen.   Carlos American. [place], [facility] [phone]

## 2019-12-26 NOTE — Patient Instructions (Addendum)
Make sure to stay hydrated Slowly change position of head and slowly get up from sitting to standing  Can take meclizine 12.5-25 mg by mouth every 8 hours as needed for dizziness  Okay to continue to try the epley maneuver if vertigo persist we can refer you to PT for vestibular rehab.   How to Perform the Epley Maneuver The Epley maneuver is an exercise that relieves symptoms of vertigo. Vertigo is the feeling that you or your surroundings are moving when they are not. When you feel vertigo, you may feel like the room is spinning and have trouble walking. Dizziness is a little different than vertigo. When you are dizzy, you may feel unsteady or light-headed. You can do this maneuver at home whenever you have symptoms of vertigo. You can do it up to 3 times a day until your symptoms go away. Even though the Epley maneuver may relieve your vertigo for a few weeks, it is possible that your symptoms will return. This maneuver relieves vertigo, but it does not relieve dizziness. What are the risks? If it is done correctly, the Epley maneuver is considered safe. Sometimes it can lead to dizziness or nausea that goes away after a short time. If you develop other symptoms, such as changes in vision, weakness, or numbness, stop doing the maneuver and call your health care provider. How to perform the Epley maneuver 1. Sit on the edge of a bed or table with your back straight and your legs extended or hanging over the edge of the bed or table. 2. Turn your head halfway toward the affected ear or side. 3. Lie backward quickly with your head turned until you are lying flat on your back. You may want to position a pillow under your shoulders. 4. Hold this position for 30 seconds. You may experience an attack of vertigo. This is normal. 5. Turn your head to the opposite direction until your unaffected ear is facing the floor. 6. Hold this position for 30 seconds. You may experience an attack of vertigo. This  is normal. Hold this position until the vertigo stops. 7. Turn your whole body to the same side as your head. Hold for another 30 seconds. 8. Sit back up. You can repeat this exercise up to 3 times a day. Follow these instructions at home:  After doing the Epley maneuver, you can return to your normal activities.  Ask your health care provider if there is anything you should do at home to prevent vertigo. He or she may recommend that you: ? Keep your head raised (elevated) with two or more pillows while you sleep. ? Do not sleep on the side of your affected ear. ? Get up slowly from bed. ? Avoid sudden movements during the day. ? Avoid extreme head movement, like looking up or bending over. Contact a health care provider if:  Your vertigo gets worse.  You have other symptoms, including: ? Nausea. ? Vomiting. ? Headache. Get help right away if:  You have vision changes.  You have a severe or worsening headache or neck pain.  You cannot stop vomiting.  You have new numbness or weakness in any part of your body. Summary  Vertigo is the feeling that you or your surroundings are moving when they are not.  The Epley maneuver is an exercise that relieves symptoms of vertigo.  If the Epley maneuver is done correctly, it is considered safe. You can do it up to 3 times a day. This  information is not intended to replace advice given to you by your health care provider. Make sure you discuss any questions you have with your health care provider. Document Revised: 01/14/2017 Document Reviewed: 12/23/2015 Elsevier Patient Education  2020 [employer].

## 2019-12-27 LAB — CBC WITH DIFFERENTIAL/PLATELET
Absolute Monocytes: 559 cells/uL (ref 200–950)
Basophils Absolute: 52 cells/uL (ref 0–200)
Basophils Relative: 0.8 %
Eosinophils Absolute: 143 cells/uL (ref 15–500)
Eosinophils Relative: 2.2 %
HCT: 45.3 % (ref 38.5–50.0)
Hemoglobin: 14.8 g/dL (ref 13.2–17.1)
Lymphs Abs: 1053 cells/uL (ref 850–3900)
MCH: 30.8 pg (ref 27.0–33.0)
MCHC: 32.7 g/dL (ref 32.0–36.0)
MCV: 94.4 fL (ref 80.0–100.0)
MPV: 9.7 fL (ref 7.5–12.5)
Monocytes Relative: 8.6 %
Neutro Abs: 4693 cells/uL (ref 1500–7800)
Neutrophils Relative %: 72.2 %
Platelets: 202 10*3/uL (ref 140–400)
RBC: 4.8 10*6/uL (ref 4.20–5.80)
RDW: 12.1 % (ref 11.0–15.0)
Total Lymphocyte: 16.2 %
WBC: 6.5 10*3/uL (ref 3.8–10.8)

## 2019-12-27 LAB — BASIC METABOLIC PANEL WITH GFR
BUN: 21 mg/dL (ref 7–25)
CO2: 28 mmol/L (ref 20–32)
Calcium: 9.4 mg/dL (ref 8.6–10.3)
Chloride: 104 mmol/L (ref 98–110)
Creat: 0.93 mg/dL (ref 0.70–1.18)
GFR, Est African American: 92 mL/min/{1.73_m2} (ref >=60)
GFR, Est Non African American: 79 mL/min/{1.73_m2} (ref >=60)
Glucose, Bld: 153 mg/dL — ABNORMAL HIGH (ref 65–139)
Potassium: 4.1 mmol/L (ref 3.5–5.3)
Sodium: 138 mmol/L (ref 135–146)

## 2019-12-27 NOTE — Telephone Encounter (Signed)
High risk or very high risk warning populated when attempting to refill medication. RX request sent to PCP for review and approval if warranted.   

## 2019-12-28 ENCOUNTER — Ambulatory Visit: Payer: Medicare Other | Admitting: Nurse Practitioner

## 2019-12-31 DIAGNOSIS — I10 Essential (primary) hypertension: Secondary | ICD-10-CM

## 2020-01-01 MED ORDER — HYDROCHLOROTHIAZIDE 12.5 MG PO CAPS: 12.5000 mg | ORAL_CAPSULE | Freq: Every day | ORAL | 3 refills | Status: DC

## 2020-01-01 NOTE — Telephone Encounter (Signed)
Pt aware of new medication.

## 2020-01-01 NOTE — Telephone Encounter (Signed)
Start HCTZ 12.5 mg daily, in AM. Should probably take after his walk.

## 2020-01-15 ENCOUNTER — Other Ambulatory Visit: Payer: Medicare Other | Admitting: *Deleted

## 2020-01-15 ENCOUNTER — Other Ambulatory Visit: Payer: Self-pay

## 2020-01-15 DIAGNOSIS — I10 Essential (primary) hypertension: Secondary | ICD-10-CM | POA: Diagnosis not present

## 2020-01-15 LAB — BASIC METABOLIC PANEL
BUN/Creatinine Ratio: 31 — ABNORMAL HIGH (ref 10–24)
BUN: 31 mg/dL — ABNORMAL HIGH (ref 8–27)
CO2: 24 mmol/L (ref 20–29)
Calcium: 9.5 mg/dL (ref 8.6–10.2)
Chloride: 103 mmol/L (ref 96–106)
Creatinine, Ser: 1 mg/dL (ref 0.76–1.27)
GFR calc Af Amer: 84 mL/min/{1.73_m2} (ref >59)
GFR calc non Af Amer: 73 mL/min/{1.73_m2} (ref >59)
Glucose: 115 mg/dL — ABNORMAL HIGH (ref 65–99)
Potassium: 4.3 mmol/L (ref 3.5–5.2)
Sodium: 140 mmol/L (ref 134–144)

## 2020-01-25 ENCOUNTER — Other Ambulatory Visit: Payer: Self-pay | Admitting: *Deleted

## 2020-01-25 MED ORDER — RAMIPRIL 10 MG PO CAPS
10.0000 mg | ORAL_CAPSULE | Freq: Two times a day (BID) | ORAL | 2 refills | Status: DC
Start: 2020-01-25 — End: 2020-09-02

## 2020-03-20 MED ORDER — HYDROCHLOROTHIAZIDE 12.5 MG PO CAPS
12.5000 mg | ORAL_CAPSULE | Freq: Every day | ORAL | 3 refills | Status: DC
Start: 2020-03-20 — End: 2021-02-25

## 2020-04-14 DIAGNOSIS — I83892 Varicose veins of left lower extremities with other complications: Secondary | ICD-10-CM | POA: Diagnosis not present

## 2020-04-14 DIAGNOSIS — I8312 Varicose veins of left lower extremity with inflammation: Secondary | ICD-10-CM | POA: Diagnosis not present

## 2020-04-22 DIAGNOSIS — I8312 Varicose veins of left lower extremity with inflammation: Secondary | ICD-10-CM | POA: Diagnosis not present

## 2020-04-30 ENCOUNTER — Ambulatory Visit (INDEPENDENT_AMBULATORY_CARE_PROVIDER_SITE_OTHER): Payer: Medicare Other | Admitting: Nurse Practitioner

## 2020-04-30 ENCOUNTER — Encounter: Payer: Self-pay | Admitting: Nurse Practitioner

## 2020-04-30 ENCOUNTER — Other Ambulatory Visit: Payer: Self-pay

## 2020-04-30 ENCOUNTER — Telehealth: Payer: Self-pay

## 2020-04-30 DIAGNOSIS — Z0000 Encounter for general adult medical examination without abnormal findings: Secondary | ICD-10-CM

## 2020-04-30 NOTE — Progress Notes (Signed)
   This service is provided via telemedicine  No vital signs collected/recorded due to the encounter was a telemedicine visit.   Location of patient (ex: home, work):  Home  Patient consents to a telephone visit: Yes, see telephone visit dated 04/30/2020  Location of the provider (ex: office, home):  [facility], Office   Name of any referring provider:  N/A  Names of all persons participating in the telemedicine service and their role in the encounter:  S.Chrae B/CMA, Sherrie Mustache, NP, and Patient   Time spent on call:  7 min with medical assistant

## 2020-04-30 NOTE — Telephone Encounter (Signed)
[name], Tim Walters are scheduled for a virtual visit with your provider today.    Just as we do with appointments in the office, we must obtain your consent to participate.  Your consent will be active for this visit and any virtual visit you may have with one of our providers in the next 365 days.    If you have a MyChart account, I can also send a copy of this consent to you electronically.  All virtual visits are billed to your insurance company just like a traditional visit in the office.  As this is a virtual visit, video technology does not allow for your provider to perform a traditional examination.  This may limit your provider's ability to fully assess your condition.  If your provider identifies any concerns that need to be evaluated in person or the need to arrange testing such as labs, EKG, etc, we will make arrangements to do so.    Although advances in technology are sophisticated, we cannot ensure that it will always work on either your end or our end.  If the connection with a video visit is poor, we may have to switch to a telephone visit.  With either a video or telephone visit, we are not always able to ensure that we have a secure connection.   I need to obtain your verbal consent now.   Are you willing to proceed with your visit today?   Tim Walters has provided verbal consent on 04/30/2020 for a virtual visit (video or telephone).   Leigh Aurora Shel[place], [place] 04/30/2020  10:06 AM

## 2020-04-30 NOTE — Progress Notes (Signed)
Subjective:   Tim Walters is a 75 y.o. male who presents for Medicare Annual/Subsequent preventive examination.  Review of Systems           Objective:    There were no vitals filed for this visit. There is no height or weight on file to calculate BMI.  Advanced Directives 04/30/2020 11/12/2019 05/24/2019 05/11/2019 04/30/2019 11/06/2018 03/28/2018  Does Patient Have a Medical Advance Directive? Yes Yes No No No No No  Type of Advance Directive Out of facility DNR (pink MOST or yellow form) Out of facility DNR (pink MOST or yellow form) - - - - -  Does patient want to make changes to medical advance directive? No - Patient declined No - Patient declined - - - Yes (MAU/Ambulatory/Procedural Areas - Information given) -  Copy of [place] in Chart? - - - - - - -  Would patient like information on creating a medical advance directive? - - - Yes (MAU/Ambulatory/Procedural Areas - Information given) Yes (MAU/Ambulatory/Procedural Areas - Information given) - Yes (MAU/Ambulatory/Procedural Areas - Information given)  Pre-existing out of facility DNR order (yellow form or pink MOST form) Yellow form placed in chart (order not valid for inpatient use);Pink MOST form placed in chart (order not valid for inpatient use) Pink MOST form placed in chart (order not valid for inpatient use) - - - - -    Current Medications (verified) Outpatient Encounter Medications as of 04/30/2020  Medication Sig  . aspirin 81 MG tablet Take 1 tablet (81 mg total) by mouth daily.  Marland Kitchen atorvastatin (LIPITOR) 80 MG tablet TAKE 1 TABLET BY MOUTH  DAILY  . carvedilol (COREG) 12.5 MG tablet TAKE 1 TABLET BY MOUTH  TWICE DAILY  . hydrochlorothiazide (MICROZIDE) 12.5 MG capsule Take 1 capsule (12.5 mg total) by mouth daily.  . Multiple Vitamin (MULTIVITAMIN WITH MINERALS) TABS tablet Take 1 tablet by mouth daily.  . naproxen sodium (ALEVE) 220 MG tablet Take 220 mg by mouth as needed.  Marland Kitchen OMEPRAZOLE PO  Take 20 mg by mouth every morning.   . ramipril (ALTACE) 10 MG capsule Take 1 capsule (10 mg total) by mouth 2 (two) times daily.  . sertraline (ZOLOFT) 50 MG tablet TAKE 1 TABLET BY MOUTH  DAILY   No facility-administered encounter medications on file as of 04/30/2020.    Allergies (verified) Patient has no known allergies.   History: Past Medical History:  Diagnosis Date  . Arthritis    lt ankle  . Breast lump    Per [facility]   . Colon polyp   . Coronary artery disease    a. BMS to RCA 2003 with residual LAD/diag disease treated medically, normal EF.  Marland Kitchen Depression   . Diverticulosis    Per records from [facility]   . GERD (gastroesophageal reflux disease)   . Hyperkalemia    a. K of 5.2 in 2017.  Marland Kitchen Hyperlipidemia   . Hypertension   . Idiopathic peripheral neuropathy    Per records from [facility]   . Impaired fasting glucose    Per records from [facility]   . Kidney stones   . Monoclonal gammopathy of undetermined significance    Per records from [facility]   . Myocardial infarction ([place]) 11/15/2001   Dr. Daneen Schick [facility])  . OSA (obstructive sleep apnea)    Per [facility] New Patient Packet  . Peripheral neuropathy    Left Foot, Per [place] New Patient Packet  .  Pre-diabetes   . Sleep apnea    had test several yr ago-said he did not need a cpap-still snores  . Torn rotator cuff    Per [place] Patient Packet  . Venous insufficiency of left leg    Per [place] Patient Packet  . Wears glasses    Past Surgical History:  Procedure Laterality Date  . ANGIOPLASTY  2003   Per records from [facility]   . CARDIAC CATHETERIZATION  23003   stent rca  . CARDIOVASCULAR STRESS TEST  02/16/2016   Per records from [facility]   . COLONOSCOPY  06/17/2003   Per records from [facility], Dr.Ganem to be repeated 2015  . coronary artery stent  11/15/2001  . CORONARY STENT PLACEMENT  11/15/2001   Per records from  [facility]   . CYST REMOVAL TRUNK Left 05/08/2013   Procedure: CYST REMOVAL BACK;  Surgeon: Adin Hector, MD;  Location: [place];  Service: General;  Laterality: Left;  . INGUINAL HERNIA REPAIR Left 03/24/2015   Procedure: OPEN REPAIR LEFT INGUINAL HERNIA ;  Surgeon: Fanny Skates, MD;  Location: [place];  Service: General;  Laterality: Left;  . INSERTION OF MESH Left 03/24/2015   Procedure: INSERTION OF MESH;  Surgeon: Fanny Skates, MD;  Location: [place];  Service: General;  Laterality: Left;  . SHOULDER ARTHROSCOPY W/ ROTATOR CUFF REPAIR  2011   right  . SPINE SURGERY  02/12/1993   L2, L3 fragmented disc  . VASECTOMY  1987  . VEIN REPAIR  09/2018   Vein injections    Family History  Problem Relation Age of Onset  . Macular degeneration Mother   . COPD Mother   . Non-Hodgkin's lymphoma Mother   . Lung cancer Mother        Per [facility] New Patient Packet   . Hypertension Father   . Suicidality Father 18       Per [place] New Patient Packet   . Depression Father   . Angina Father        Per records from [facility]   . Diverticulosis Father        Per records from [facility]   . Heart disease Father   . High Cholesterol Sister   . Macular degeneration Maternal Grandmother   . Schizophrenia Son   . Bipolar disorder Son   . Autism Son   . Post-traumatic stress disorder Son   . Diabetes type II Daughter   . Breast cancer Neg Hx   . Colon cancer Neg Hx   . Esophageal cancer Neg Hx   . Stomach cancer Neg Hx   . Liver disease Neg Hx   . Pancreatic cancer Neg Hx    Social History   Socioeconomic History  . Marital status: Married    Spouse name: Katharine Look  . Number of children: 7  . Years of education: 65  . Highest education level: Not on file  Occupational History  . Occupation: Retired  Tobacco Use  . Smoking status: Former Smoker    Years: 12.00    Types: Cigarettes    Quit date: 02/15/1974    Years  since quitting: 46.2  . Smokeless tobacco: Never Used  Vaping Use  . Vaping Use: Never used  Substance and Sexual Activity  . Alcohol use: Yes    Alcohol/week: 14.0 standard drinks    Types: 14 Standard drinks or equivalent per week    Comment: 14  drinks weekly in the evening   . Drug use: No  . Sexual activity: Not on file  Other Topics Concern  . Not on file  Social History Narrative   Lives with wife, Katharine Look   Caffeine use: Coffee daily   Caffeine free soda      As of 03/28/2018:   Diet: N/A      Caffeine: Yes      Married, if yes what year: Yes, 1971      Do you live in a house, apartment, assisted living, condo, trailer, ect: House, one stories, 2 persons      Pets: 1 cat      Current/Past profession: Dietitian, Chief Strategy Officer      Exercise: Yes, cardio strength 5 days weekly          Living Will: No   DNR: No, would like to discuss   POA/HPOA: Yes      Functional Status:   Do you have difficulty bathing or dressing yourself? No   Do you have difficulty preparing food or eating? No   Do you have difficulty managing your medications? No   Do you have difficulty managing your finances? No   Do you have difficulty affording your medications? No   Social Determinants of Radio broadcast assistant Strain: Not on file  Food Insecurity: Not on file  Transportation Needs: Not on file  Physical Activity: Not on file  Stress: Not on file  Social Connections: Not on file    Tobacco Counseling Counseling given: Not Answered   Clinical Intake:  Pre-visit preparation completed: Yes  Pain : No/denies pain           Diabetic?no         Activities of Daily Living No flowsheet data found.  Patient Care Team: Lauree Chandler, NP as PCP - General (Geriatric Medicine) Fanny Skates, MD as Consulting Physician (General Surgery) Marchia Bond, MD as Consulting Physician (Orthopedic Surgery) Jarome Matin, MD as Consulting Physician  (Dermatology) Belva Crome, MD as Consulting Physician (Cardiology) Kathrynn Ducking, MD as Consulting Physician (Neurology) Fonnie Mu, OD (Optometry)  Indicate any recent Medical Services you may have received from other than [facility] providers in the past year (date may be approximate).     Assessment:   This is a routine wellness examination for [place].  Hearing/Vision screen  Hearing Screening   125Hz  250Hz  500Hz  1000Hz  2000Hz  3000Hz  4000Hz  6000Hz  8000Hz   Right ear:           Left ear:           Comments: No hearing issues   Vision Screening Comments: Last eye exam less 12 months ago.   Dietary issues and exercise activities discussed:    Goals    .  Weight (lb) < 186 lb (84.4 kg) (pt-stated)      Depression Screen PHQ 2/9 Scores 04/30/2020 04/30/2019 07/11/2018 04/26/2018 03/28/2018  PHQ - 2 Score 0 0 0 0 0    Fall Risk Fall Risk  04/30/2020 12/26/2019 05/24/2019 05/11/2019 04/30/2019  Falls in the past year? 0 0 0 0 0  Number falls in past yr: 0 0 0 0 0  Injury with Fall? 0 0 0 0 0    FALL RISK PREVENTION PERTAINING TO THE HOME:  Any stairs in or around the home? Yes  If so, are there any without handrails? No  Home free of loose throw rugs in walkways, pet beds, electrical cords, etc? Yes  Adequate  lighting in your home to reduce risk of falls? Yes   ASSISTIVE DEVICES UTILIZED TO PREVENT FALLS:  Life alert? No  Use of a cane, walker or w/c? No  Grab bars in the bathroom? No  Shower chair or bench in shower? No  Elevated toilet seat or a handicapped toilet? yes TIMED UP AND GO:  Was the test performed? No .    Cognitive Function: MMSE - Mini Mental State Exam 04/26/2018  Orientation to time 5  Orientation to Place 5  Registration 3  Recall 3  Language- name 2 objects 2  Language- repeat 1  Language- follow 3 step command 3  Language- read & follow direction 1  Write a sentence 1  Copy design 1     6CIT Screen 04/30/2020 04/30/2019  What Year? 0  points 0 points  What month? 0 points 0 points  What time? 0 points 0 points  Count back from 20 0 points 0 points  Months in reverse 0 points 0 points  Repeat phrase 0 points 0 points  Total Score 0 0    Immunizations Immunization History  Administered Date(s) Administered  . Fluad Quad(high Dose 65+) 11/06/2018  . Influenza, High Dose Seasonal PF 10/30/2019  . Influenza, Seasonal, Injecte, Preservative Fre 02/15/2012  . Influenza-Unspecified 12/17/2016, 01/15/2018  . PFIZER(Purple Top)SARS-COV-2 Vaccination 02/24/2019, 03/17/2019, 10/30/2019  . Pneumococcal Conjugate-13 02/19/2015  . Pneumococcal Polysaccharide-23 09/17/2009  . Td 03/31/2001  . Tdap 10/13/2010, 04/10/2018  . Zoster 05/25/2012  . Zoster Recombinat (Shingrix) 02/03/2017, 04/14/2017    TDAP status: Up to date  Flu Vaccine status: Up to date  Pneumococcal vaccine status: Up to date  Covid-19 vaccine status: Completed vaccines  Qualifies for Shingles Vaccine? Yes   Zostavax completed Yes   Shingrix Completed?: Yes  Screening Tests Health Maintenance  Topic Date Due  . COVID-19 Vaccine (4 - Booster for Pfizer series) 04/28/2020  . COLONOSCOPY (Pts 45-3yrs Insurance coverage will need to be confirmed)  06/13/2022  . TETANUS/TDAP  04/10/2028  . INFLUENZA VACCINE  Completed  . Hepatitis C Screening  Completed  . PNA vac Low Risk Adult  Completed  . HPV VACCINES  Aged Out    Health Maintenance  Health Maintenance Due  Topic Date Due  . COVID-19 Vaccine (4 - Booster for Pfizer series) 04/28/2020    Colorectal cancer screening: Type of screening: Colonoscopy. Completed 2021. Repeat every 3 years  Lung Cancer Screening: (Low Dose CT Chest recommended if Age 41-80 years, 30 pack-year currently smoking OR have quit w/in 15years.) does not qualify.     Additional Screening:  Hepatitis C Screening: does qualify; Completed 2021   Vision Screening: Recommended annual ophthalmology exams for early  detection of glaucoma and other disorders of the eye. Is the patient up to date with their annual eye exam?  Yes  Who is the provider or what is the name of the office in which the patient attends annual eye exams? [place] [facility] If pt is not established with a provider, would they like to be referred to a provider to establish care? Yes .   Dental Screening: Recommended annual dental exams for proper oral hygiene  Community Resource Referral / Chronic Care Management: CRR required this visit?  No   CCM required this visit?  No      Plan:     I have personally reviewed and noted the following in the patient's chart:   . Medical and social history . Use of alcohol, tobacco  or illicit drugs  . Current medications and supplements . Functional ability and status . Nutritional status . Physical activity . Advanced directives . List of other physicians . Hospitalizations, surgeries, and ER visits in previous 12 months . Vitals . Screenings to include cognitive, depression, and falls . Referrals and appointments  In addition, I have reviewed and discussed with patient certain preventive protocols, quality metrics, and best practice recommendations. A written personalized care plan for preventive services as well as general preventive health recommendations were provided to patient.     Lauree Chandler, NP   04/30/2020    Virtual Visit via Telephone Note  I connected with@ on 04/30/20 at 10:00 AM EDT by telephone and verified that I am speaking with the correct person using two identifiers.  Location: Patient: home Provider: [place]   I discussed the limitations, risks, security and privacy concerns of performing an evaluation and management service by telephone and the availability of in person appointments. I also discussed with the patient that there may be a patient responsible charge related to this service. The patient expressed understanding and agreed to proceed.   I  discussed the assessment and treatment plan with the patient. The patient was provided an opportunity to ask questions and all were answered. The patient agreed with the plan and demonstrated an understanding of the instructions.   The patient was advised to call back or seek an in-person evaluation if the symptoms worsen or if the condition fails to improve as anticipated.  I provided 18 minutes of non-face-to-face time during this encounter.  Carlos American. Harle Battiest Avs printed and mailed

## 2020-04-30 NOTE — Patient Instructions (Addendum)
Tim Walters , Thank you for taking time to come for your Medicare Wellness Visit. I appreciate your ongoing commitment to your health goals. Please review the following plan we discussed and let me know if I can assist you in the future.   Screening recommendations/referrals: Colonoscopy up to date Recommended yearly ophthalmology/optometry visit for glaucoma screening and checkup Recommended yearly dental visit for hygiene and checkup  Vaccinations: Influenza vaccine up to date Pneumococcal vaccine up to date Tdap vaccine up to date- 9will be due after 10/12/20) Shingles vaccine up to date    Advanced directives: on file.   Conditions/risks identified: cardiovascular disease, advanced age, obesity.   Next appointment: 1 year for AWV  Preventive Care 40 Years and Older, Male Preventive care refers to lifestyle choices and visits with your health care provider that can promote health and wellness. What does preventive care include?  A yearly physical exam. This is also called an annual well check.  Dental exams once or twice a year.  Routine eye exams. Ask your health care provider how often you should have your eyes checked.  Personal lifestyle choices, including:  Daily care of your teeth and gums.  Regular physical activity.  Eating a healthy diet.  Avoiding tobacco and drug use.  Limiting alcohol use.  Practicing safe sex.  Taking low doses of aspirin every day.  Taking vitamin and mineral supplements as recommended by your health care provider. What happens during an annual well check? The services and screenings done by your health care provider during your annual well check will depend on your age, overall health, lifestyle risk factors, and family history of disease. Counseling  Your health care provider may ask you questions about your:  Alcohol use.  Tobacco use.  Drug use.  Emotional well-being.  Home and relationship well-being.  Sexual  activity.  Eating habits.  History of falls.  Memory and ability to understand (cognition).  Work and work Statistician. Screening  You may have the following tests or measurements:  Height, weight, and BMI.  Blood pressure.  Lipid and cholesterol levels. These may be checked every 5 years, or more frequently if you are over 33 years old.  Skin check.  Lung cancer screening. You may have this screening every year starting at age 50 if you have a 30-pack-year history of smoking and currently smoke or have quit within the past 15 years.  Fecal occult blood test (FOBT) of the stool. You may have this test every year starting at age 80.  Flexible sigmoidoscopy or colonoscopy. You may have a sigmoidoscopy every 5 years or a colonoscopy every 10 years starting at age 79.  Prostate cancer screening. Recommendations will vary depending on your family history and other risks.  Hepatitis C blood test.  Hepatitis B blood test.  Sexually transmitted disease (STD) testing.  Diabetes screening. This is done by checking your blood sugar (glucose) after you have not eaten for a while (fasting). You may have this done every 1-3 years.  Abdominal aortic aneurysm (AAA) screening. You may need this if you are a current or former smoker.  Osteoporosis. You may be screened starting at age 53 if you are at high risk. Talk with your health care provider about your test results, treatment options, and if necessary, the need for more tests. Vaccines  Your health care provider may recommend certain vaccines, such as:  Influenza vaccine. This is recommended every year.  Tetanus, diphtheria, and acellular pertussis (Tdap, Td) vaccine. You may  need a Td booster every 10 years.  Zoster vaccine. You may need this after age 70.  Pneumococcal 13-valent conjugate (PCV13) vaccine. One dose is recommended after age 60.  Pneumococcal polysaccharide (PPSV23) vaccine. One dose is recommended after age  56. Talk to your health care provider about which screenings and vaccines you need and how often you need them. This information is not intended to replace advice given to you by your health care provider. Make sure you discuss any questions you have with your health care provider. Document Released: 02/28/2015 Document Revised: 10/22/2015 Document Reviewed: 12/03/2014 Elsevier Interactive Patient Education  2017 [place] Prevention in the Home Falls can cause injuries. They can happen to people of all ages. There are many things you can do to make your home safe and to help prevent falls. What can I do on the outside of my home?  Regularly fix the edges of walkways and driveways and fix any cracks.  Remove anything that might make you trip as you walk through a door, such as a raised step or threshold.  Trim any bushes or trees on the path to your home.  Use bright outdoor lighting.  Clear any walking paths of anything that might make someone trip, such as rocks or tools.  Regularly check to see if handrails are loose or broken. Make sure that both sides of any steps have handrails.  Any raised decks and porches should have guardrails on the edges.  Have any leaves, snow, or ice cleared regularly.  Use sand or salt on walking paths during winter.  Clean up any spills in your garage right away. This includes oil or grease spills. What can I do in the bathroom?  Use night lights.  Install grab bars by the toilet and in the tub and shower. Do not use towel bars as grab bars.  Use non-skid mats or decals in the tub or shower.  If you need to sit down in the shower, use a plastic, non-slip stool.  Keep the floor dry. Clean up any water that spills on the floor as soon as it happens.  Remove soap buildup in the tub or shower regularly.  Attach bath mats securely with double-sided non-slip rug tape.  Do not have throw rugs and other things on the floor that can make  you trip. What can I do in the bedroom?  Use night lights.  Make sure that you have a light by your bed that is easy to reach.  Do not use any sheets or blankets that are too big for your bed. They should not hang down onto the floor.  Have a firm chair that has side arms. You can use this for support while you get dressed.  Do not have throw rugs and other things on the floor that can make you trip. What can I do in the kitchen?  Clean up any spills right away.  Avoid walking on wet floors.  Keep items that you use a lot in easy-to-reach places.  If you need to reach something above you, use a strong step stool that has a grab bar.  Keep electrical cords out of the way.  Do not use floor polish or wax that makes floors slippery. If you must use wax, use non-skid floor wax.  Do not have throw rugs and other things on the floor that can make you trip. What can I do with my stairs?  Do not leave any items on  the stairs.  Make sure that there are handrails on both sides of the stairs and use them. Fix handrails that are broken or loose. Make sure that handrails are as long as the stairways.  Check any carpeting to make sure that it is firmly attached to the stairs. Fix any carpet that is loose or worn.  Avoid having throw rugs at the top or bottom of the stairs. If you do have throw rugs, attach them to the floor with carpet tape.  Make sure that you have a light switch at the top of the stairs and the bottom of the stairs. If you do not have them, ask someone to add them for you. What else can I do to help prevent falls?  Wear shoes that:  Do not have high heels.  Have rubber bottoms.  Are comfortable and fit you well.  Are closed at the toe. Do not wear sandals.  If you use a stepladder:  Make sure that it is fully opened. Do not climb a closed stepladder.  Make sure that both sides of the stepladder are locked into place.  Ask someone to hold it for you, if  possible.  Clearly mark and make sure that you can see:  Any grab bars or handrails.  First and last steps.  Where the edge of each step is.  Use tools that help you move around (mobility aids) if they are needed. These include:  Canes.  Walkers.  Scooters.  Crutches.  Turn on the lights when you go into a dark area. Replace any light bulbs as soon as they burn out.  Set up your furniture so you have a clear path. Avoid moving your furniture around.  If any of your floors are uneven, fix them.  If there are any pets around you, be aware of where they are.  Review your medicines with your doctor. Some medicines can make you feel dizzy. This can increase your chance of falling. Ask your doctor what other things that you can do to help prevent falls. This information is not intended to replace advice given to you by your health care provider. Make sure you discuss any questions you have with your health care provider. Document Released: 11/28/2008 Document Revised: 07/10/2015 Document Reviewed: 03/08/2014 Elsevier Interactive Patient Education  2017 [employer].

## 2020-05-06 DIAGNOSIS — I83812 Varicose veins of left lower extremities with pain: Secondary | ICD-10-CM | POA: Diagnosis not present

## 2020-05-08 ENCOUNTER — Other Ambulatory Visit: Payer: Self-pay

## 2020-05-08 ENCOUNTER — Other Ambulatory Visit: Payer: Medicare Other

## 2020-05-08 DIAGNOSIS — I10 Essential (primary) hypertension: Secondary | ICD-10-CM

## 2020-05-08 DIAGNOSIS — R739 Hyperglycemia, unspecified: Secondary | ICD-10-CM

## 2020-05-08 DIAGNOSIS — E785 Hyperlipidemia, unspecified: Secondary | ICD-10-CM | POA: Diagnosis not present

## 2020-05-09 LAB — LIPID PANEL
Cholesterol: 146 mg/dL (ref <200)
HDL: 64 mg/dL (ref >=40)
LDL Cholesterol (Calc): 66 mg/dL (calc)
Non-HDL Cholesterol (Calc): 82 mg/dL (calc) (ref <130)
Total CHOL/HDL Ratio: 2.3 (calc) (ref <5.0)
Triglycerides: 80 mg/dL (ref <150)

## 2020-05-09 LAB — COMPLETE METABOLIC PANEL WITH GFR
AG Ratio: 1.8 (calc) (ref 1.0–2.5)
ALT: 19 U/L (ref 9–46)
AST: 20 U/L (ref 10–35)
Albumin: 4.3 g/dL (ref 3.6–5.1)
Alkaline phosphatase (APISO): 61 U/L (ref 35–144)
BUN/Creatinine Ratio: 26 (calc) — ABNORMAL HIGH (ref 6–22)
BUN: 27 mg/dL — ABNORMAL HIGH (ref 7–25)
CO2: 25 mmol/L (ref 20–32)
Calcium: 9.5 mg/dL (ref 8.6–10.3)
Chloride: 103 mmol/L (ref 98–110)
Creat: 1.03 mg/dL (ref 0.70–1.18)
GFR, Est African American: 81 mL/min/{1.73_m2} (ref >=60)
GFR, Est Non African American: 70 mL/min/{1.73_m2} (ref >=60)
Globulin: 2.4 g/dL (calc) (ref 1.9–3.7)
Glucose, Bld: 137 mg/dL — ABNORMAL HIGH (ref 65–99)
Potassium: 4.4 mmol/L (ref 3.5–5.3)
Sodium: 139 mmol/L (ref 135–146)
Total Bilirubin: 0.6 mg/dL (ref 0.2–1.2)
Total Protein: 6.7 g/dL (ref 6.1–8.1)

## 2020-05-09 LAB — CBC WITH DIFFERENTIAL/PLATELET
Absolute Monocytes: 660 cells/uL (ref 200–950)
Basophils Absolute: 50 cells/uL (ref 0–200)
Basophils Relative: 0.7 %
Eosinophils Absolute: 163 cells/uL (ref 15–500)
Eosinophils Relative: 2.3 %
HCT: 44 % (ref 38.5–50.0)
Hemoglobin: 14.8 g/dL (ref 13.2–17.1)
Lymphs Abs: 1186 cells/uL (ref 850–3900)
MCH: 32 pg (ref 27.0–33.0)
MCHC: 33.6 g/dL (ref 32.0–36.0)
MCV: 95 fL (ref 80.0–100.0)
MPV: 10 fL (ref 7.5–12.5)
Monocytes Relative: 9.3 %
Neutro Abs: 5041 cells/uL (ref 1500–7800)
Neutrophils Relative %: 71 %
Platelets: 197 10*3/uL (ref 140–400)
RBC: 4.63 10*6/uL (ref 4.20–5.80)
RDW: 12.8 % (ref 11.0–15.0)
Total Lymphocyte: 16.7 %
WBC: 7.1 10*3/uL (ref 3.8–10.8)

## 2020-05-09 LAB — HEMOGLOBIN A1C
Hgb A1c MFr Bld: 5.9 % of total Hgb — ABNORMAL HIGH (ref <5.7)
Mean Plasma Glucose: 123 mg/dL
eAG (mmol/L): 6.8 mmol/L

## 2020-05-12 ENCOUNTER — Encounter: Payer: Self-pay | Admitting: Nurse Practitioner

## 2020-05-12 ENCOUNTER — Ambulatory Visit (INDEPENDENT_AMBULATORY_CARE_PROVIDER_SITE_OTHER): Payer: Medicare Other | Admitting: Nurse Practitioner

## 2020-05-12 ENCOUNTER — Other Ambulatory Visit: Payer: Self-pay

## 2020-05-12 VITALS — BP 138/78 | HR 53 | Temp 97.7°F | Resp 20 | Ht 67.0 in | Wt 194.6 lb

## 2020-05-12 DIAGNOSIS — I25118 Atherosclerotic heart disease of native coronary artery with other forms of angina pectoris: Secondary | ICD-10-CM

## 2020-05-12 DIAGNOSIS — G4733 Obstructive sleep apnea (adult) (pediatric): Secondary | ICD-10-CM | POA: Diagnosis not present

## 2020-05-12 DIAGNOSIS — I10 Essential (primary) hypertension: Secondary | ICD-10-CM

## 2020-05-12 DIAGNOSIS — R739 Hyperglycemia, unspecified: Secondary | ICD-10-CM | POA: Diagnosis not present

## 2020-05-12 DIAGNOSIS — E785 Hyperlipidemia, unspecified: Secondary | ICD-10-CM | POA: Diagnosis not present

## 2020-05-12 DIAGNOSIS — K219 Gastro-esophageal reflux disease without esophagitis: Secondary | ICD-10-CM | POA: Diagnosis not present

## 2020-05-12 DIAGNOSIS — F325 Major depressive disorder, single episode, in full remission: Secondary | ICD-10-CM

## 2020-05-12 NOTE — Progress Notes (Signed)
Careteam: Patient Care Team: Lauree Chandler, NP as PCP - General (Geriatric Medicine) Fanny Skates, MD as Consulting Physician (General Surgery) Marchia Bond, MD as Consulting Physician (Orthopedic Surgery) Jarome Matin, MD as Consulting Physician (Dermatology) Belva Crome, MD as Consulting Physician (Cardiology) Kathrynn Ducking, MD as Consulting Physician (Neurology) Fonnie Mu, OD (Optometry)  PLACE OF SERVICE:  [facility]  Advanced Directive information Does Patient Have a Medical Advance Directive?: Yes, Type of Advance Directive: Out of facility DNR (pink MOST or yellow form), Pre-existing out of facility DNR order (yellow form or pink MOST form): Yellow form placed in chart (order not valid for inpatient use);Pink MOST form placed in chart (order not valid for inpatient use), Does patient want to make changes to medical advance directive?: No - Patient declined  No Known Allergies  Chief Complaint  Patient presents with  . Medical Management of Chronic Issues    6 Month Follow Up     HPI: Patient is a 75 y.o. male for routine follow up  Does admit to needing to drink more water. Does not get thirty as much during the winter.   htn- controlled on hctz, ramipril, coreg  Walks every morning 3-4 miles. 116/60s after walking.   Hyperlipidemia- LDL at goal on lipitor 80 mg daily  GERD- controlled on omeprazole.   Energy level is low over the last few weeks. n  OSA- could not tolerate CPAP.   Review of Systems:  Review of Systems  Constitutional: Negative for chills, fever and weight loss.  HENT: Negative for tinnitus.   Respiratory: Negative for cough, sputum production and shortness of breath.   Cardiovascular: Negative for chest pain, palpitations and leg swelling.  Gastrointestinal: Negative for abdominal pain, constipation, diarrhea and heartburn.  Genitourinary: Negative for dysuria, frequency and urgency.  Musculoskeletal: Negative for  back pain, falls, joint pain and myalgias.  Skin: Negative.   Neurological: Negative for dizziness and headaches.  Psychiatric/Behavioral: Negative for depression and memory loss. The patient does not have insomnia.     Past Medical History:  Diagnosis Date  . Arthritis    lt ankle  . Breast lump    Per records from [facility]   . Colon polyp   . Coronary artery disease    a. BMS to RCA 2003 with residual LAD/diag disease treated medically, normal EF.  Marland Kitchen Depression   . Diverticulosis    Per records from [facility]   . GERD (gastroesophageal reflux disease)   . Hyperkalemia    a. K of 5.2 in 2017.  Marland Kitchen Hyperlipidemia   . Hypertension   . Idiopathic peripheral neuropathy    Per records from [facility]   . Impaired fasting glucose    Per records from [facility]   . Kidney stones   . Monoclonal gammopathy of undetermined significance    Per records from [facility]   . Myocardial infarction ([place]) 11/15/2001   Dr. Daneen Schick [facility])  . OSA (obstructive sleep apnea)    Per [facility] New Patient Packet  . Peripheral neuropathy    Left Foot, Per [place] New Patient Packet  . Pre-diabetes   . Sleep apnea    had test several yr ago-said he did not need a cpap-still snores  . Torn rotator cuff    Per [place] Patient Packet  . Venous insufficiency of left leg    Per [place] Patient Packet  . Wears glasses    Past Surgical History:  Procedure Laterality Date  . ANGIOPLASTY  2003   Per records from [facility]   . CARDIAC CATHETERIZATION  23003   stent rca  . CARDIOVASCULAR STRESS TEST  02/16/2016   Per records from [facility]   . COLONOSCOPY  06/17/2003   Per records from [facility], Dr.Ganem to be repeated 2015  . coronary artery stent  11/15/2001  . CORONARY STENT PLACEMENT  11/15/2001   Per records from [facility]   . CYST REMOVAL TRUNK Left 05/08/2013   Procedure: CYST REMOVAL BACK;  Surgeon: Adin Hector, MD;   Location: [place];  Service: General;  Laterality: Left;  . INGUINAL HERNIA REPAIR Left 03/24/2015   Procedure: OPEN REPAIR LEFT INGUINAL HERNIA ;  Surgeon: Fanny Skates, MD;  Location: [place];  Service: General;  Laterality: Left;  . INSERTION OF MESH Left 03/24/2015   Procedure: INSERTION OF MESH;  Surgeon: Fanny Skates, MD;  Location: [place];  Service: General;  Laterality: Left;  . SHOULDER ARTHROSCOPY W/ ROTATOR CUFF REPAIR  2011   right  . SPINE SURGERY  02/12/1993   L2, L3 fragmented disc  . VASECTOMY  1987  . VEIN REPAIR  09/2018   Vein injections    Social History:   reports that he quit smoking about 46 years ago. His smoking use included cigarettes. He quit after 12.00 years of use. He has never used smokeless tobacco. He reports current alcohol use of about 14.0 standard drinks of alcohol per week. He reports that he does not use drugs.  Family History  Problem Relation Age of Onset  . Macular degeneration Mother   . COPD Mother   . Non-Hodgkin's lymphoma Mother   . Lung cancer Mother        Per [facility] New Patient Packet   . Hypertension Father   . Suicidality Father 31       Per [place] New Patient Packet   . Depression Father   . Angina Father        Per [facility]   . Diverticulosis Father        Per [facility]   . Heart disease Father   . High Cholesterol Sister   . Macular degeneration Maternal Grandmother   . Schizophrenia Son   . Bipolar disorder Son   . Autism Son   . Post-traumatic stress disorder Son   . Diabetes type II Daughter   . Breast cancer Neg Hx   . Colon cancer Neg Hx   . Esophageal cancer Neg Hx   . Stomach cancer Neg Hx   . Liver disease Neg Hx   . Pancreatic cancer Neg Hx     Medications: Patient's Medications  New Prescriptions   No medications on file  Previous Medications   ASPIRIN 81 MG TABLET    Take 1 tablet (81 mg total) by mouth daily.    ATORVASTATIN (LIPITOR) 80 MG TABLET    TAKE 1 TABLET BY MOUTH  DAILY   CARVEDILOL (COREG) 12.5 MG TABLET    TAKE 1 TABLET BY MOUTH  TWICE DAILY   HYDROCHLOROTHIAZIDE (MICROZIDE) 12.5 MG CAPSULE    Take 1 capsule (12.5 mg total) by mouth daily.   MULTIPLE VITAMIN (MULTIVITAMIN WITH MINERALS) TABS TABLET    Take 1 tablet by mouth daily.   NAPROXEN SODIUM (ALEVE) 220 MG TABLET    Take 220 mg by mouth as needed.   OMEPRAZOLE PO  Take 20 mg by mouth every morning.    RAMIPRIL (ALTACE) 10 MG CAPSULE    Take 1 capsule (10 mg total) by mouth 2 (two) times daily.   SERTRALINE (ZOLOFT) 50 MG TABLET    TAKE 1 TABLET BY MOUTH  DAILY  Modified Medications   No medications on file  Discontinued Medications   No medications on file    Physical Exam:  Vitals:   05/12/20 1142  BP: 140/70  Pulse: (!) 53  Resp: 20  Temp: 97.7 F (36.5 C)  TempSrc: Temporal  SpO2: 99%  Weight: 194 lb 9.6 oz (88.3 kg)  Height: 5\' 7"  (1.702 m)   Body mass index is 30.48 kg/m. Wt Readings from Last 3 Encounters:  05/12/20 194 lb 9.6 oz (88.3 kg)  12/26/19 193 lb (87.5 kg)  11/12/19 200 lb (90.7 kg)    Physical Exam Constitutional:      General: He is not in acute distress.    Appearance: He is well-developed. He is not diaphoretic.  HENT:     Head: Normocephalic and atraumatic.     Mouth/Throat:     Pharynx: No oropharyngeal exudate.  Eyes:     Conjunctiva/sclera: Conjunctivae normal.     Pupils: Pupils are equal, round, and reactive to light.  Cardiovascular:     Rate and Rhythm: Normal rate and regular rhythm.     Heart sounds: Normal heart sounds.  Pulmonary:     Effort: Pulmonary effort is normal.     Breath sounds: Normal breath sounds.  Abdominal:     General: Bowel sounds are normal.     Palpations: Abdomen is soft.  Musculoskeletal:        General: No tenderness.     Cervical back: Normal range of motion and neck supple.  Skin:    General: Skin is warm and dry.  Neurological:      Mental Status: He is alert and oriented to person, place, and time.     Labs reviewed: Basic Metabolic Panel: Recent Labs    12/26/19 1101 01/15/20 1125 05/08/20 0830  NA 138 140 139  K 4.1 4.3 4.4  CL 104 103 103  CO2 28 24 25   GLUCOSE 153* 115* 137*  BUN 21 31* 27*  CREATININE 0.93 1.00 1.03  CALCIUM 9.4 9.5 9.5   Liver Function Tests: Recent Labs    11/08/19 0829 05/08/20 0830  AST 20 20  ALT 21 19  BILITOT 0.6 0.6  PROT 6.8 6.7   No results for input(s): LIPASE, AMYLASE in the last 8760 hours. No results for input(s): AMMONIA in the last 8760 hours. CBC: Recent Labs    12/26/19 1101 05/08/20 0830  WBC 6.5 7.1  NEUTROABS 4,693 5,041  HGB 14.8 14.8  HCT 45.3 44.0  MCV 94.4 95.0  PLT 202 197   Lipid Panel: Recent Labs    11/08/19 0829 05/08/20 0830  CHOL 140 146  HDL 58 64  LDLCALC 68 66  TRIG 56 80  CHOLHDL 2.4 2.3   TSH: No results for input(s): TSH in the last 8760 hours. A1C: Lab Results  Component Value Date   HGBA1C 5.9 (H) 05/08/2020     Assessment/Plan 1. Major depressive disorder in full remission, unspecified whether recurrent ([place]) Well controlled on zoloft   2. Obstructive sleep apnea -did not tolerate CPAP when he was tested years ago. Reports some recent daytime fatigue. ?contributing OSA. Encouraged re-evaluation by pulmonary to see if there are options that he could tolerate  at this time.  3. Atherosclerosis of native coronary artery of native heart with stable angina pectoris (HCC) Stable, no chest pains, shortness of breath, continues on coreg, asa, lipitor.   4. Hyperlipidemia, unspecified hyperlipidemia type LDL at goal on lipitor. Continue dietary modifications.  5. Hyperglycemia -stable, continue low sugar, low carbohydrate diet.   6. Essential hypertension, benign -elevated blood pressure recently, cardiologist increased ramipril to 10 mg BID and added HCTZ. On repeat bp 138/78. Reports more sodium in diet,  encouraged low sodium diet. Continue medication as prescribed.   7. Gastroesophageal reflux disease without esophagitis -stable on omeprazole with dietary modifications.  Next appt: 6 months, labs prior.  Tim Walters. [place], [facility] [phone]

## 2020-06-06 ENCOUNTER — Other Ambulatory Visit: Payer: Self-pay | Admitting: Interventional Cardiology

## 2020-08-07 ENCOUNTER — Other Ambulatory Visit: Payer: Self-pay | Admitting: Interventional Cardiology

## 2020-08-07 ENCOUNTER — Other Ambulatory Visit: Payer: Self-pay | Admitting: Nurse Practitioner

## 2020-09-01 ENCOUNTER — Other Ambulatory Visit: Payer: Self-pay | Admitting: Interventional Cardiology

## 2020-09-04 NOTE — Progress Notes (Addendum)
Cardiology Office Note:    Date:  09/05/2020   ID:  Tim Walters, DOB 08-Dec-1945, MRN [id number]  PCP:  Tim Chandler, NP  Cardiologist:  None   Referring MD: Tim Chandler, NP   No chief complaint on file.   History of Present Illness:    Tim Walters is a 75 y.o. male with a hx of CAD (anterior MI s/p BMS to RCA 2003 with residual LAD/diagonal disease), HTN, HLD, depression, sleep apnea (not using CPAP), GERD, arthritis, MGUS, and pre-diabetes.    Overall, Tim Walters is doing okay.  He has some lightheadedness.  He notes after exercises his blood pressure tends to dip.  He intermittently has dyspnea on exertion that has a variable threshold.  Some days he can walk his 4 miles without any dyspnea other days he feels relatively short of breath the entire walk.  No pain associated with it.  Also does not have the endurance that he had to be able to work in his yard for 4 5 hours.  He gets and 2 hours max when he is working in the yard.  He has not felt the need for any sublingual nitroglycerin.  Past Medical History:  Diagnosis Date   Arthritis    lt ankle   Breast lump    Per records from [place]    Colon polyp    Coronary artery disease    a. BMS to RCA 2003 with residual LAD/diag disease treated medically, normal EF.   Depression    Diverticulosis    Per records from [place]    GERD (gastroesophageal reflux disease)    Hyperkalemia    a. K of 5.2 in 2017.   Hyperlipidemia    Hypertension    Idiopathic peripheral neuropathy    Per records from [facility]    Impaired fasting glucose    Per records from [place]    Kidney stones    Monoclonal gammopathy of undetermined significance    Per records from [place]    Myocardial infarction [facility]) 11/15/2001   Dr. Daneen Schick [facility])   OSA (obstructive sleep apnea)    Per [facility] New Patient Packet   Peripheral neuropathy    Left Foot, Per [place] New Patient Packet    Pre-diabetes    Sleep apnea    had test several yr ago-said he did not need a cpap-still snores   Torn rotator cuff    Per [facility] New Patient Packet   Venous insufficiency of left leg    Per [facility] New Patient Packet   Wears glasses     Past Surgical History:  Procedure Laterality Date   ANGIOPLASTY  2003   Per records from [place]   stent rca   CARDIOVASCULAR STRESS TEST  02/16/2016   Per records from [place]  06/17/2003   Per records from [place], Dr.Ganem to be repeated 2015   coronary artery stent  11/15/2001   CORONARY STENT PLACEMENT  11/15/2001   Per records from [place] Left 05/08/2013   Procedure: CYST REMOVAL BACK;  Surgeon: Adin Hector, MD;  Location: [place];  Service: General;  Laterality: Left;   INGUINAL HERNIA REPAIR Left 03/24/2015   Procedure: OPEN REPAIR LEFT INGUINAL HERNIA ;  Surgeon: Fanny Skates, MD;  Location: [place];  Service: General;  Laterality: Left;  INSERTION OF MESH Left 03/24/2015   Procedure: INSERTION OF MESH;  Surgeon: Fanny Skates, MD;  Location: [place];  Service: General;  Laterality: Left;   SHOULDER ARTHROSCOPY W/ ROTATOR CUFF REPAIR  2011   right   SPINE SURGERY  02/12/1993   L2, L3 fragmented disc   VASECTOMY  1987   VEIN REPAIR  09/2018   Vein injections     Current Medications: Current Meds  Medication Sig   ramipril (ALTACE) 10 MG capsule Take 1 capsule (10 mg total) by mouth daily.     Allergies:   Patient has no known allergies.   Social History   Socioeconomic History   Marital status: Married    Spouse name: Katharine Look   Number of children: 7   Years of education: 16   Highest education level: Not on file  Occupational History   Occupation: Retired  Tobacco Use   Smoking status: Former    Years: 12.00    Types: Cigarettes    Quit date: 02/15/1974     Years since quitting: 46.5   Smokeless tobacco: Never  Vaping Use   Vaping Use: Never used  Substance and Sexual Activity   Alcohol use: Yes    Alcohol/week: 14.0 standard drinks    Types: 14 Standard drinks or equivalent per week    Comment: 14 drinks weekly in the evening    Drug use: No   Sexual activity: Not Currently  Other Topics Concern   Not on file  Social History Narrative   Lives with wife, Katharine Look   Caffeine use: Coffee daily   Caffeine free soda      As of 03/28/2018:   Diet: N/A      Caffeine: Yes      Married, if yes what year: Yes, 1971      Do you live in a house, apartment, assisted living, condo, trailer, ect: House, one stories, 2 persons      Pets: 1 cat      Current/Past profession: Dietitian, Chief Strategy Officer      Exercise: Yes, cardio strength 5 days weekly          Living Will: No   DNR: No, would like to discuss   POA/HPOA: Yes      Functional Status:   Do you have difficulty bathing or dressing yourself? No   Do you have difficulty preparing food or eating? No   Do you have difficulty managing your medications? No   Do you have difficulty managing your finances? No   Do you have difficulty affording your medications? No   Social Determinants of Radio broadcast assistant Strain: Not on file  Food Insecurity: Not on file  Transportation Needs: Not on file  Physical Activity: Not on file  Stress: Not on file  Social Connections: Not on file     Family History: [name] family history includes Angina in his father; Autism in his son; Bipolar disorder in his son; COPD in his mother; Depression in his father; Diabetes type II in his daughter; Diverticulosis in his father; Heart disease in his father; High Cholesterol in his sister; Hypertension in his father; Lung cancer in his mother; Macular degeneration in his maternal grandmother and mother; Non-Hodgkin's lymphoma in his mother; Post-traumatic stress disorder in his son;  Schizophrenia in his son; Suicidality (age of onset: 33) in his father. There is no history of Breast cancer, Colon cancer, Esophageal cancer, Stomach cancer, Liver disease, or Pancreatic cancer.  ROS:   Please see the history of present illness.    Last functional test was greater than 5 years ago.  He does notice that his blood pressure can dip to [id number] systolic or less after exercise.  All other systems reviewed and are negative.  EKGs/Labs/Other Studies Reviewed:    The following studies were reviewed today: No functional testing  EKG:  EKG sinus rhythm with normal appearance.  When compared to Jul 12, 2019, PVC ACs are no longer present  Recent Labs: 05/08/2020: ALT 19; BUN 27; Creat 1.03; Hemoglobin 14.8; Platelets 197; Potassium 4.4; Sodium 139  Recent Lipid Panel    Component Value Date/Time   CHOL 146 05/08/2020 0830   TRIG 80 05/08/2020 0830   HDL 64 05/08/2020 0830   CHOLHDL 2.3 05/08/2020 0830   LDLCALC 66 05/08/2020 0830    Physical Exam:    VS:  BP 118/70   Pulse 66   Ht 5\' 7"  (1.702 m)   Wt 189 lb 6.4 oz (85.9 kg)   SpO2 97%   BMI 29.66 kg/m     Wt Readings from Last 3 Encounters:  09/05/20 189 lb 6.4 oz (85.9 kg)  05/12/20 194 lb 9.6 oz (88.3 kg)  12/26/19 193 lb (87.5 kg)     GEN: Compatible with a. No acute distress HEENT: Normal NECK: No JVD. LYMPHATICS: No lymphadenopathy CARDIAC: No murmur. RRR no gallop, or edema. VASCULAR:  Normal Pulses. No bruits. RESPIRATORY:  Clear to auscultation without rales, wheezing or rhonchi  ABDOMEN: Soft, non-tender, non-distended, No pulsatile mass, MUSCULOSKELETAL: No deformity  SKIN: Warm and dry NEUROLOGIC:  Alert and oriented x 3 PSYCHIATRIC:  Normal affect   ASSESSMENT:    1. CAD in native artery   2. Essential hypertension   3. Hyperlipidemia, unspecified hyperlipidemia type   4. Sleep apnea, unspecified type    PLAN:    In order of problems listed above:  Stress Myoview will be done because  of exertional dyspnea of variable threshold, new over the past 6 months. Blood pressure is too well controlled with post exercise systolic blood pressures dipping less than 100 mmHg.  He is now 36.  Blood pressure target will eventually be 140/80 mmHg.  We will work to keep his blood pressure and the 130 to 951 mmHg systolic range by decreasing Altace to 10 mg once per day. Continue high intensity Lipitor.  LDL was 66 in March. States his wife does not notice him snoring at night.  He is not compliant with CPAP.  Overall education and awareness concerning primary/secondary risk prevention was discussed in detail: LDL less than 70, hemoglobin A1c less than 7, blood pressure target less than 130/80 mmHg, >150 minutes of moderate aerobic activity per week, avoidance of smoking, weight control (via diet and exercise), and continued surveillance/management of/for obstructive sleep apnea.    Medication Adjustments/Labs and Tests Ordered: Current medicines are reviewed at length with the patient today.  Concerns regarding medicines are outlined above.  Orders Placed This Encounter  Procedures   MYOCARDIAL PERFUSION IMAGING   Meds ordered this encounter  Medications   ramipril (ALTACE) 10 MG capsule    Sig: Take 1 capsule (10 mg total) by mouth daily.    Dispense:  90 capsule    Refill:  3    Dose change    There are no Patient Instructions on file for this visit.   Signed, Sinclair Grooms, MD  09/05/2020 1:46 PM    [facility]

## 2020-09-05 ENCOUNTER — Ambulatory Visit: Payer: Medicare Other | Admitting: Interventional Cardiology

## 2020-09-05 ENCOUNTER — Encounter: Payer: Self-pay | Admitting: Interventional Cardiology

## 2020-09-05 ENCOUNTER — Other Ambulatory Visit: Payer: Self-pay

## 2020-09-05 ENCOUNTER — Encounter: Payer: Self-pay | Admitting: *Deleted

## 2020-09-05 VITALS — BP 118/70 | HR 66 | Ht 67.0 in | Wt 189.4 lb

## 2020-09-05 DIAGNOSIS — R0600 Dyspnea, unspecified: Secondary | ICD-10-CM | POA: Diagnosis not present

## 2020-09-05 DIAGNOSIS — G4730 Sleep apnea, unspecified: Secondary | ICD-10-CM | POA: Diagnosis not present

## 2020-09-05 DIAGNOSIS — E785 Hyperlipidemia, unspecified: Secondary | ICD-10-CM

## 2020-09-05 DIAGNOSIS — I10 Essential (primary) hypertension: Secondary | ICD-10-CM | POA: Diagnosis not present

## 2020-09-05 DIAGNOSIS — I2510 Atherosclerotic heart disease of native coronary artery without angina pectoris: Secondary | ICD-10-CM | POA: Diagnosis not present

## 2020-09-05 DIAGNOSIS — R0609 Other forms of dyspnea: Secondary | ICD-10-CM

## 2020-09-05 MED ORDER — RAMIPRIL 10 MG PO CAPS: 10.0000 mg | ORAL_CAPSULE | Freq: Every day | ORAL | 3 refills | Status: DC

## 2020-09-05 NOTE — Patient Instructions (Signed)
Medication Instructions:  1) DECREASE Ramipril to '10mg'$  once daily  *If you need a refill on your cardiac medications before your next appointment, please call your pharmacy*   Lab Work: None If you have labs (blood work) drawn today and your tests are completely normal, you will receive your results only by: [place] (if you have MyChart) OR A paper copy in the mail If you have any lab test that is abnormal or we need to change your treatment, we will call you to review the results.   Testing/Procedures: Your physician has requested that you have an exercise stress myoview. For further information please visit [email or web address]. Please follow instruction sheet, as given    Follow-Up: At [facility], you and your health needs are our priority.  As part of our continuing mission to provide you with exceptional heart care, we have created designated Provider Care Teams.  These Care Teams include your primary Cardiologist (physician) and Advanced Practice Providers (APPs -  Physician Assistants and Nurse Practitioners) who all work together to provide you with the care you need, when you need it.  We recommend signing up for the patient portal called "MyChart".  Sign up information is provided on this After Visit Summary.  MyChart is used to connect with patients for Virtual Visits (Telemedicine).  Patients are able to view lab/test results, encounter notes, upcoming appointments, etc.  Non-urgent messages can be sent to your provider as well.   To learn more about what you can do with MyChart, go to [email or web address].    Your next appointment:   1 year(s)  The format for your next appointment:   In Person  Provider:   You may see Dr. Daneen Schick or one of the following Advanced Practice Providers on your designated Care Team:   Cecilie Kicks, NP    Other Instructions

## 2020-09-08 ENCOUNTER — Encounter: Payer: Self-pay | Admitting: Interventional Cardiology

## 2020-09-08 NOTE — Addendum Note (Signed)
Addended by: Belva Crome on: 09/08/2020 03:07 PM   Modules accepted: Orders

## 2020-09-08 NOTE — Addendum Note (Signed)
Addended by: Loren Racer on: 09/08/2020 03:05 PM   Modules accepted: Orders

## 2020-09-09 ENCOUNTER — Telehealth (HOSPITAL_COMMUNITY): Payer: Self-pay | Admitting: *Deleted

## 2020-09-09 NOTE — Telephone Encounter (Signed)
Patient given detailed instructions per Myocardial Perfusion Study Information Sheet for the test on 09/15/20 at 8:00. Patient notified to arrive 15 minutes early and that it is imperative to arrive on time for appointment to keep from having the test rescheduled.  If you need to cancel or reschedule your appointment, please call the office within 24 hours of your appointment. . Patient verbalized understanding.Tim Walters

## 2020-09-15 ENCOUNTER — Ambulatory Visit (HOSPITAL_COMMUNITY): Payer: Medicare Other | Attending: Cardiology

## 2020-09-15 ENCOUNTER — Other Ambulatory Visit: Payer: Self-pay

## 2020-09-15 DIAGNOSIS — R0600 Dyspnea, unspecified: Secondary | ICD-10-CM | POA: Diagnosis not present

## 2020-09-15 DIAGNOSIS — R0609 Other forms of dyspnea: Secondary | ICD-10-CM

## 2020-09-15 DIAGNOSIS — I2510 Atherosclerotic heart disease of native coronary artery without angina pectoris: Secondary | ICD-10-CM | POA: Diagnosis not present

## 2020-09-15 LAB — MYOCARDIAL PERFUSION IMAGING
Estimated workload: 10.1 METS
Exercise duration (min): 9 min
LV dias vol: 72 mL (ref 62–150)
LV sys vol: 28 mL
MPHR: 144 {beats}/min
Peak HR: 127 {beats}/min
Percent HR: 88 %
RPE: 19
Rest HR: 53 {beats}/min
SDS: 2
SRS: 0
SSS: 2
TID: 0.96

## 2020-09-15 MED ORDER — TECHNETIUM TC 99M TETROFOSMIN IV KIT
10.2000 | PACK | Freq: Once | INTRAVENOUS | Status: AC | PRN
  Administered 2020-09-15: 10.2 via INTRAVENOUS
  Filled 2020-09-15: qty 11

## 2020-09-15 MED ORDER — TECHNETIUM TC 99M TETROFOSMIN IV KIT
31.1000 | PACK | Freq: Once | INTRAVENOUS | Status: AC | PRN
  Administered 2020-09-15: 31.1 via INTRAVENOUS
  Filled 2020-09-15: qty 32

## 2020-09-16 DIAGNOSIS — E119 Type 2 diabetes mellitus without complications: Secondary | ICD-10-CM | POA: Diagnosis not present

## 2020-09-16 LAB — HM DIABETES EYE EXAM

## 2020-09-16 IMAGING — DX DG HAND COMPLETE 3+V*L*
3 series · 3 of 3 positions shown · non-contrast
Comparison: None.

CLINICAL DATA: History of splinter in the base of the middle finger
with persistent pain and swelling. Evaluate for foreign body.

EXAM:
LEFT HAND - COMPLETE 3+ VIEW

[dg hand complete left (1 of 3)]
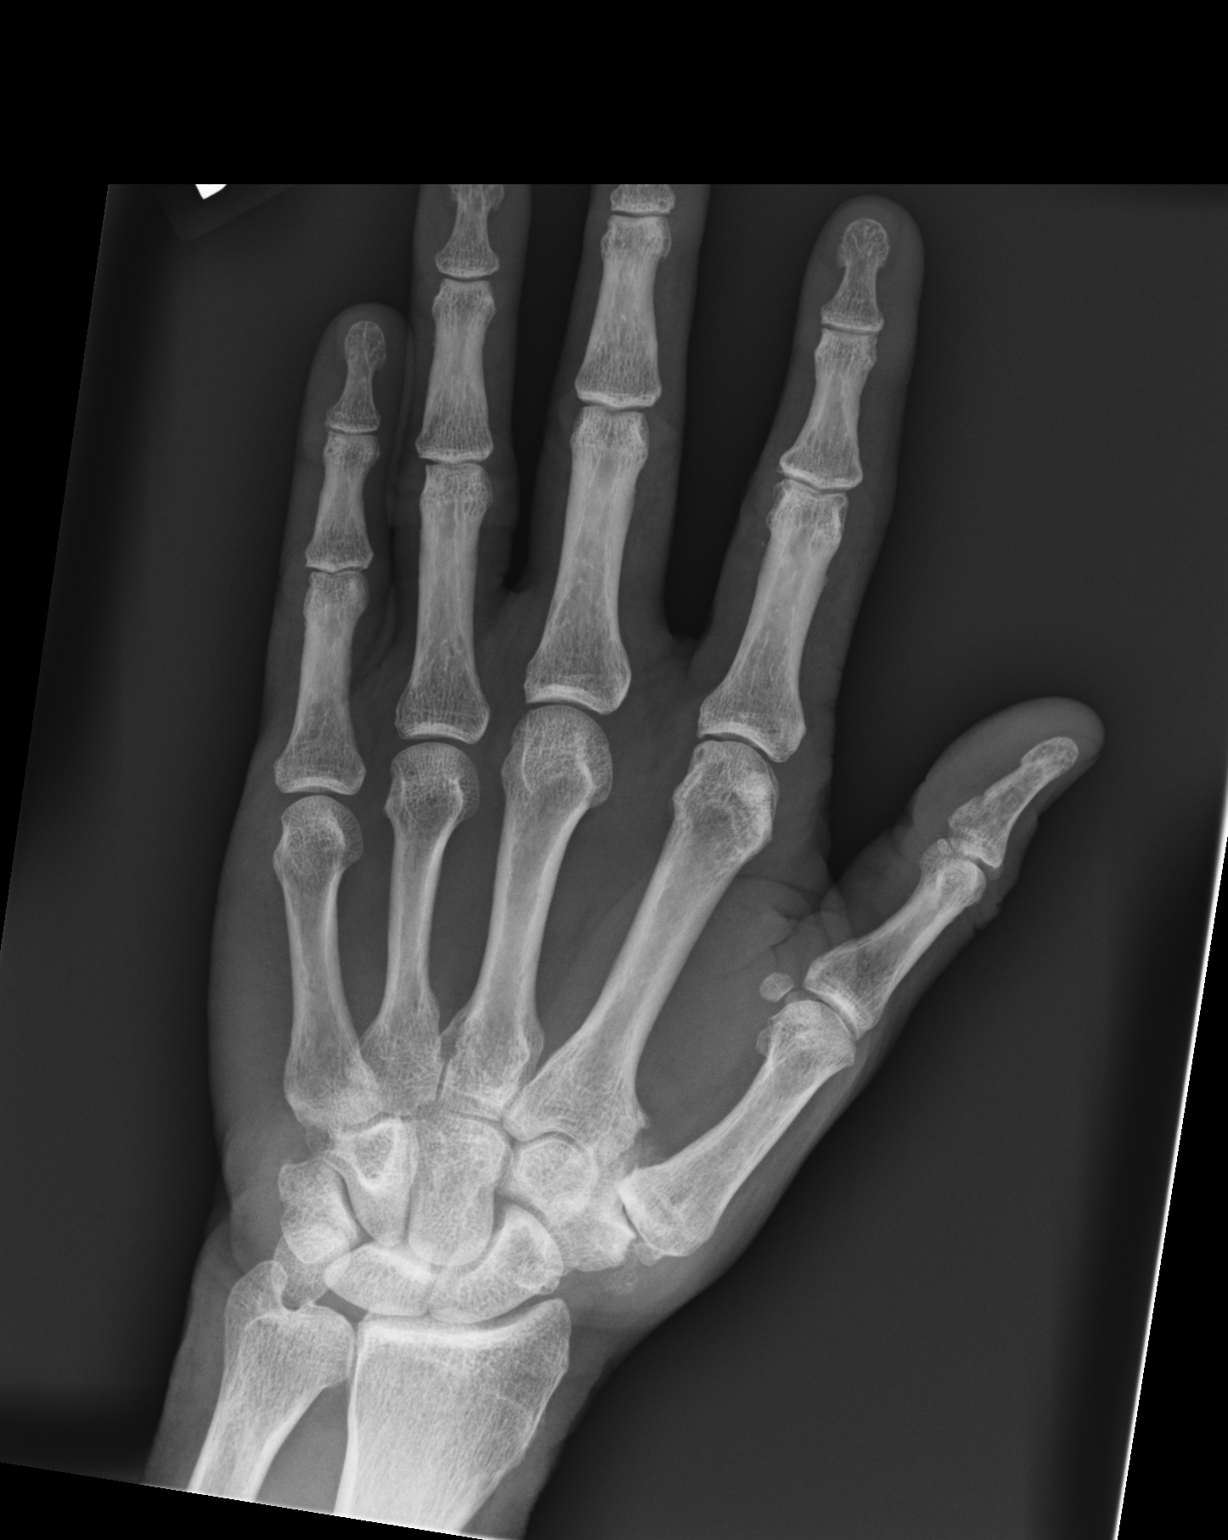

[dg hand complete left (2 of 3)]
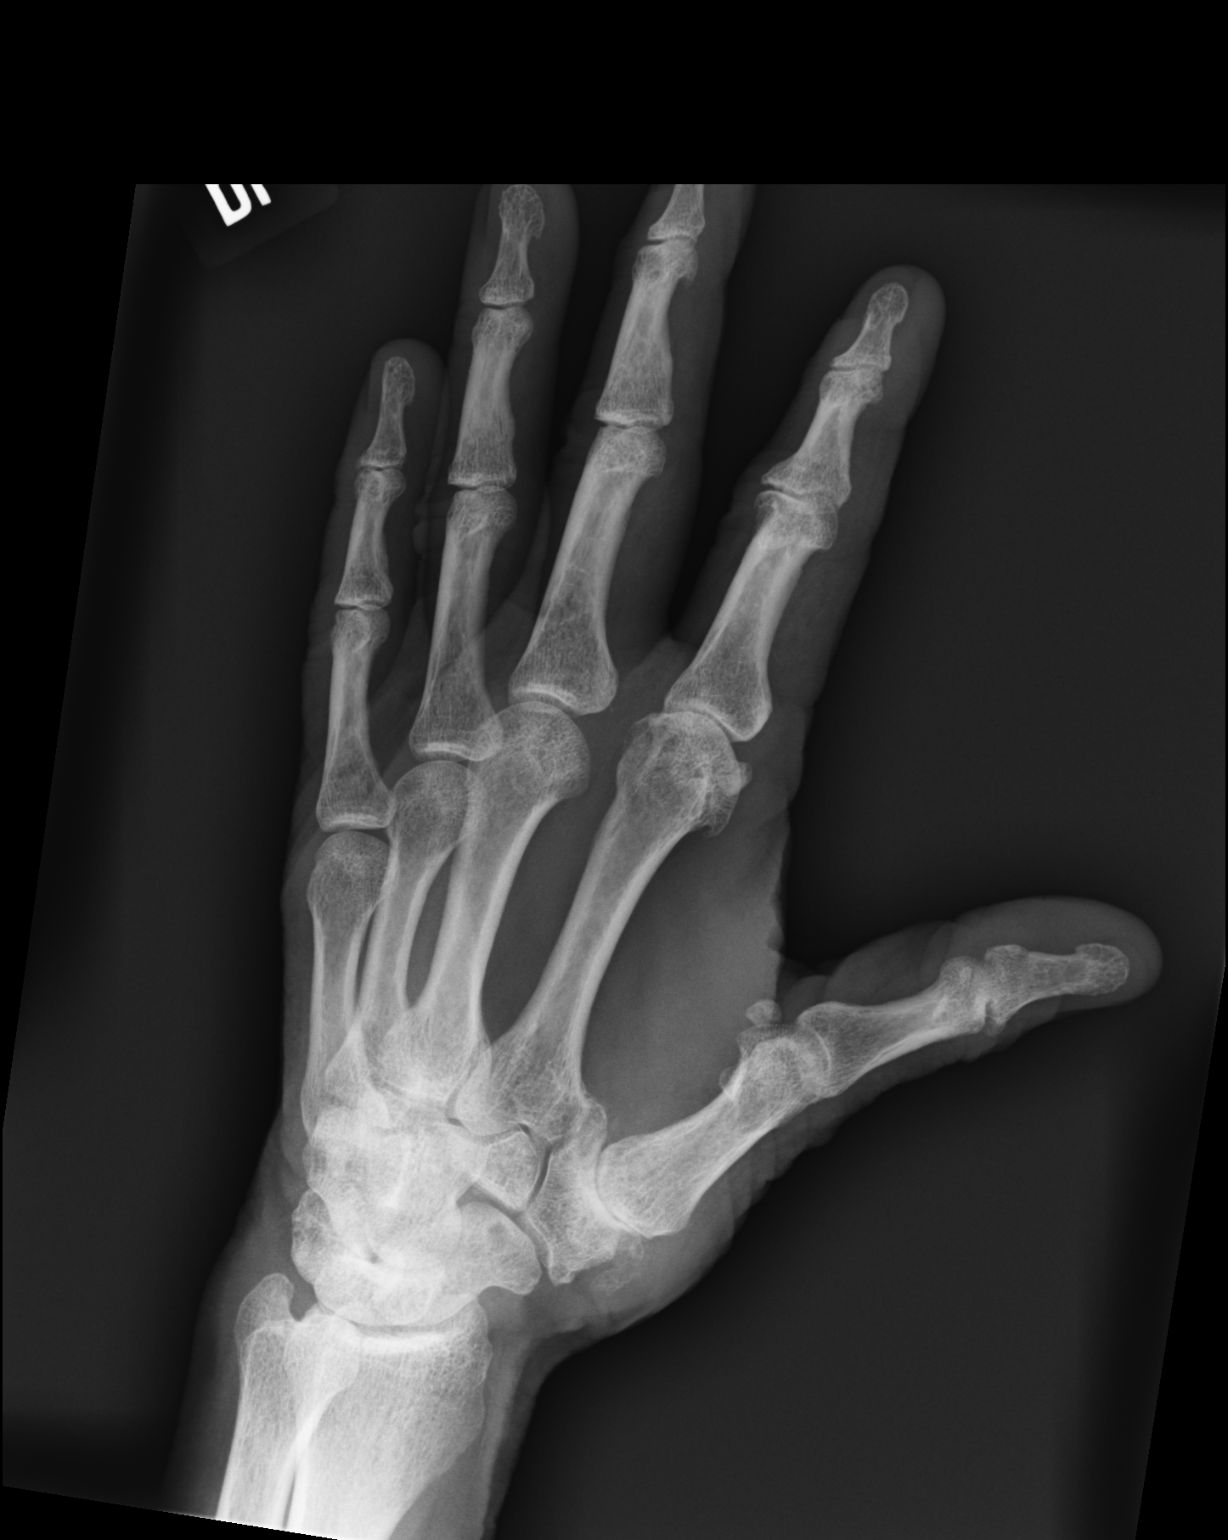

[dg hand complete left (3 of 3)]
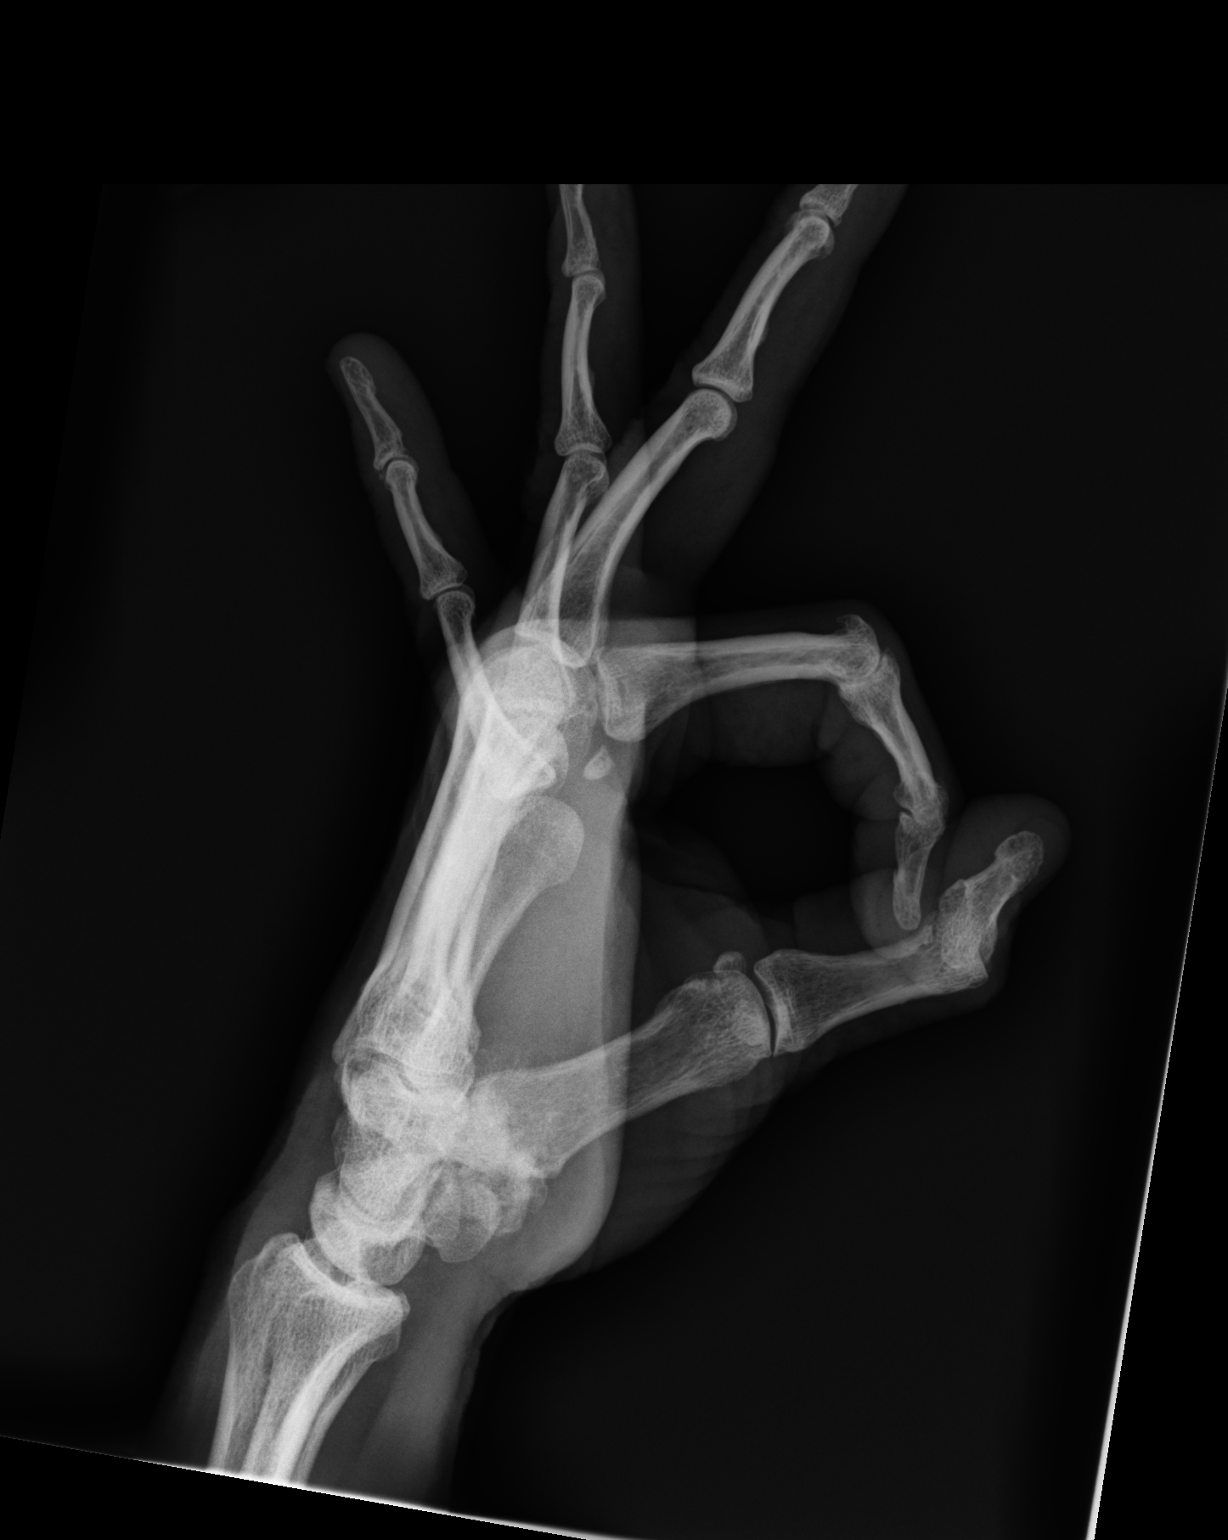

[3 of 3 positions shown; findings below may reference images not displayed]

FINDINGS: The mineralization and alignment are normal. There is no evidence of
acute fracture or dislocation. There are osteoarthritic changes at
the 2nd metacarpal phalangeal and the 1st carpometacarpal joints.
There is minimal interphalangeal involvement. No radiopaque foreign
body, soft tissue emphysema or bone destruction identified.
IMPRESSION: No evidence of radiopaque foreign body in the hand. Osteoarthritis,
greatest at the 2nd MCP and 1st CMC joints.

## 2020-10-02 ENCOUNTER — Emergency Department (HOSPITAL_COMMUNITY): Payer: Medicare Other

## 2020-10-02 ENCOUNTER — Emergency Department (HOSPITAL_COMMUNITY)
Admission: EM | Admit: 2020-10-02 | Discharge: 2020-10-02 | Disposition: A | Discharge status: Home / Self Care | Payer: Medicare Other | Attending: Emergency Medicine | Admitting: Emergency Medicine

## 2020-10-02 ENCOUNTER — Encounter (HOSPITAL_COMMUNITY): Payer: Self-pay | Admitting: Emergency Medicine

## 2020-10-02 ENCOUNTER — Other Ambulatory Visit: Payer: Self-pay

## 2020-10-02 DIAGNOSIS — I517 Cardiomegaly: Secondary | ICD-10-CM | POA: Diagnosis not present

## 2020-10-02 DIAGNOSIS — Z79899 Other long term (current) drug therapy: Secondary | ICD-10-CM | POA: Insufficient documentation

## 2020-10-02 DIAGNOSIS — R7303 Prediabetes: Secondary | ICD-10-CM | POA: Insufficient documentation

## 2020-10-02 DIAGNOSIS — R42 Dizziness and giddiness: Secondary | ICD-10-CM | POA: Diagnosis not present

## 2020-10-02 DIAGNOSIS — N201 Calculus of ureter: Secondary | ICD-10-CM | POA: Diagnosis not present

## 2020-10-02 DIAGNOSIS — Z794 Long term (current) use of insulin: Secondary | ICD-10-CM | POA: Diagnosis not present

## 2020-10-02 DIAGNOSIS — M4317 Spondylolisthesis, lumbosacral region: Secondary | ICD-10-CM | POA: Diagnosis not present

## 2020-10-02 DIAGNOSIS — I700 Atherosclerosis of aorta: Secondary | ICD-10-CM | POA: Diagnosis not present

## 2020-10-02 DIAGNOSIS — Z955 Presence of coronary angioplasty implant and graft: Secondary | ICD-10-CM | POA: Insufficient documentation

## 2020-10-02 DIAGNOSIS — N200 Calculus of kidney: Secondary | ICD-10-CM

## 2020-10-02 DIAGNOSIS — Z87891 Personal history of nicotine dependence: Secondary | ICD-10-CM | POA: Diagnosis not present

## 2020-10-02 DIAGNOSIS — I10 Essential (primary) hypertension: Secondary | ICD-10-CM | POA: Insufficient documentation

## 2020-10-02 DIAGNOSIS — I2510 Atherosclerotic heart disease of native coronary artery without angina pectoris: Secondary | ICD-10-CM | POA: Insufficient documentation

## 2020-10-02 DIAGNOSIS — M419 Scoliosis, unspecified: Secondary | ICD-10-CM | POA: Diagnosis not present

## 2020-10-02 DIAGNOSIS — Z7982 Long term (current) use of aspirin: Secondary | ICD-10-CM | POA: Insufficient documentation

## 2020-10-02 DIAGNOSIS — R001 Bradycardia, unspecified: Secondary | ICD-10-CM | POA: Insufficient documentation

## 2020-10-02 DIAGNOSIS — R030 Elevated blood-pressure reading, without diagnosis of hypertension: Secondary | ICD-10-CM

## 2020-10-02 DIAGNOSIS — M549 Dorsalgia, unspecified: Secondary | ICD-10-CM | POA: Diagnosis present

## 2020-10-02 DIAGNOSIS — M47814 Spondylosis without myelopathy or radiculopathy, thoracic region: Secondary | ICD-10-CM | POA: Diagnosis not present

## 2020-10-02 LAB — CBC WITH DIFFERENTIAL/PLATELET
Abs Immature Granulocytes: 0.01 10*3/uL (ref 0.00–0.07)
Basophils Absolute: 0.1 10*3/uL (ref 0.0–0.1)
Basophils Relative: 1 %
Eosinophils Absolute: 0.2 10*3/uL (ref 0.0–0.5)
Eosinophils Relative: 3 %
HCT: 43.1 % (ref 39.0–52.0)
Hemoglobin: 14.2 g/dL (ref 13.0–17.0)
Immature Granulocytes: 0 %
Lymphocytes Relative: 19 %
Lymphs Abs: 1.3 10*3/uL (ref 0.7–4.0)
MCH: 31.6 pg (ref 26.0–34.0)
MCHC: 32.9 g/dL (ref 30.0–36.0)
MCV: 96 fL (ref 80.0–100.0)
Monocytes Absolute: 0.8 10*3/uL (ref 0.1–1.0)
Monocytes Relative: 12 %
Neutro Abs: 4.4 10*3/uL (ref 1.7–7.7)
Neutrophils Relative %: 65 %
Platelets: 181 10*3/uL (ref 150–400)
RBC: 4.49 MIL/uL (ref 4.22–5.81)
RDW: 13.9 % (ref 11.5–15.5)
WBC: 6.7 10*3/uL (ref 4.0–10.5)
nRBC: 0 % (ref 0.0–0.2)

## 2020-10-02 LAB — COMPREHENSIVE METABOLIC PANEL
ALT: 25 U/L (ref 0–44)
AST: 25 U/L (ref 15–41)
Albumin: 3.5 g/dL (ref 3.5–5.0)
Alkaline Phosphatase: 56 U/L (ref 38–126)
Anion gap: 7 (ref 5–15)
BUN: 31 mg/dL — ABNORMAL HIGH (ref 8–23)
CO2: 25 mmol/L (ref 22–32)
Calcium: 9.1 mg/dL (ref 8.9–10.3)
Chloride: 104 mmol/L (ref 98–111)
Creatinine, Ser: 1.02 mg/dL (ref 0.61–1.24)
GFR, Estimated: >60 mL/min (ref >60)
Glucose, Bld: 115 mg/dL — ABNORMAL HIGH (ref 70–99)
Potassium: 4.4 mmol/L (ref 3.5–5.1)
Sodium: 136 mmol/L (ref 135–145)
Total Bilirubin: 0.9 mg/dL (ref 0.3–1.2)
Total Protein: 6.4 g/dL — ABNORMAL LOW (ref 6.5–8.1)

## 2020-10-02 LAB — URINALYSIS, ROUTINE W REFLEX MICROSCOPIC
Bilirubin Urine: NEGATIVE
Glucose, UA: NEGATIVE mg/dL
Hgb urine dipstick: NEGATIVE
Ketones, ur: NEGATIVE mg/dL
Leukocytes,Ua: NEGATIVE
Nitrite: NEGATIVE
Protein, ur: NEGATIVE mg/dL
Specific Gravity, Urine: 1.038 — ABNORMAL HIGH (ref 1.005–1.030)
pH: 6 (ref 5.0–8.0)

## 2020-10-02 LAB — LIPASE, BLOOD: Lipase: 32 U/L (ref 11–51)

## 2020-10-02 LAB — TROPONIN I (HIGH SENSITIVITY)
Troponin I (High Sensitivity): 7 ng/L (ref <18)
Troponin I (High Sensitivity): 5 ng/L (ref <18)

## 2020-10-02 MED ORDER — TAMSULOSIN HCL 0.4 MG PO CAPS: 0.4000 mg | ORAL_CAPSULE | Freq: Every day | ORAL | 0 refills | Status: DC

## 2020-10-02 MED ORDER — IOHEXOL 350 MG/ML SOLN
100.0000 mL | Freq: Once | INTRAVENOUS | Status: AC | PRN
  Administered 2020-10-02: 100 mL via INTRAVENOUS

## 2020-10-02 MED ORDER — SODIUM CHLORIDE 0.9 % IV BOLUS
500.0000 mL | Freq: Once | INTRAVENOUS | Status: AC
  Administered 2020-10-02: 500 mL via INTRAVENOUS

## 2020-10-02 NOTE — ED Notes (Signed)
Pt verbalized understanding of d/c instructions, meds and followup care. Denies questions. VSS, no distress noted. Steady gait to exit.  

## 2020-10-02 NOTE — ED Notes (Signed)
Patient transported to CT 

## 2020-10-02 NOTE — ED Triage Notes (Signed)
Pt reports being woken up w/ from pain between his shoulder blades.  Pt indicates a cardiac hx and became worried.  Pain is a 3/10.  No SOB, n/v or dizziness.

## 2020-10-02 NOTE — ED Provider Notes (Signed)
Emergency Medicine Provider Triage Evaluation Note  Tim Walters , a 75 y.o. male  was evaluated in triage.  Pt complains of back pain that woke him from sleep, lightheadedness that started tonight. He denies any activity change or injury. Patient reports prior MI with stent. He recently underwent a stress test which showed some exertional abnormality. He denies shortness of breath, chest pain, chest tightness. The pain is worse with shoulder movement, and somewhat relieved with leaning forward.   Review of Systems  Positive: Back pain, lightheadedness Negative: Chest pain, shortness of breath, diaphoresis  Physical Exam  BP (!) 159/86   Pulse (!) 54   Temp 97.9 F (36.6 C) (Oral)   Resp 16   SpO2 99%  Gen:   Awake, no distress  Resp:  Normal effort MSK:   Moves extremities without difficulty Other:  Tenderness to palpation of thoracic paraspinal muscles  Medical Decision Making  Medically screening exam initiated at 3:03 AM.  Appropriate orders placed.  Fernando Poole was informed that the remainder of the evaluation will be completed by another provider, this initial triage assessment does not replace that evaluation, and the importance of remaining in the ED until their evaluation is complete.  Back pain waking from sleep. Hx ACS.   Anselmo Pickler, PA-C 10/02/20 0305    Ezequiel Essex, MD 10/02/20 [phone]

## 2020-10-02 NOTE — ED Provider Notes (Signed)
[facility] EMERGENCY DEPARTMENT Provider Note   CSN: [id number] Arrival date & time: 10/02/20  0225     History Chief Complaint  Patient presents with   Back Pain    Tim Walters is a 75 y.o. male history of CAD/MI is post stent placement, GERD, hypertension, hyperlipidemia, kidney stone disease, OSA.  Patient presents today for back pain onset midnight last night.  Patient reports sudden severe sharp pain between his shoulder blades, pain does not radiate, it was constant without clear aggravating or alleviating factors.  He gradually improved throughout the night, he reports pain is now mild.  He denies similar pain in the past.  Denies fall/injury, fever/chills, chest pain, cough/shortness of breath, hemoptysis, abdominal pain, nausea/vomiting, dysuria, extremity swelling/color change, numbness/weakness, tingling or any additional concerns HPI     Past Medical History:  Diagnosis Date   Arthritis    lt ankle   Breast lump    Per records from [place]    Colon polyp    Coronary artery disease    a. BMS to RCA 2003 with residual LAD/diag disease treated medically, normal EF.   Depression    Diverticulosis    Per records from [place]    GERD (gastroesophageal reflux disease)    Hyperkalemia    a. K of 5.2 in 2017.   Hyperlipidemia    Hypertension    Idiopathic peripheral neuropathy    Per records from [place]    Impaired fasting glucose    Per records from [place]    Kidney stones    Monoclonal gammopathy of undetermined significance    Per records from [place]    Myocardial infarction [facility]) 11/15/2001   Dr. Daneen Schick [facility])   OSA (obstructive sleep apnea)    Per [facility] New Patient Packet   Peripheral neuropathy    Left Foot, Per [place] New Patient Packet   Pre-diabetes    Sleep apnea    had test several yr ago-said he did not need a cpap-still snores   Torn rotator cuff    Per [facility] New  Patient Packet   Venous insufficiency of left leg    Per [place] Patient Packet   Wears glasses     Patient Active Problem List   Diagnosis Date Noted   Major depressive disorder in full remission ([place]) 05/11/2019   Paresthesia 03/30/2016   Venous insufficiency of left leg 06/13/2015   Observation after surgery 03/24/2015   Hyperlipidemia 06/06/2013   Obstructive sleep apnea 06/06/2013   Left inguinal hernia 01/15/2013   Essential hypertension, benign 01/15/2013   Coronary atherosclerosis of native coronary artery 01/15/2013    Past Surgical History:  Procedure Laterality Date   ANGIOPLASTY  2003   Per records from [place]   stent rca   CARDIOVASCULAR STRESS TEST  02/16/2016   Per records from [place]  06/17/2003   Per records from [place], Dr.Ganem to be repeated 2015   coronary artery stent  11/15/2001   CORONARY STENT PLACEMENT  11/15/2001   Per records from [place] Left 05/08/2013   Procedure: CYST REMOVAL BACK;  Surgeon: Adin Hector, MD;  Location: [place];  Service: General;  Laterality: Left;   INGUINAL HERNIA REPAIR Left 03/24/2015   Procedure: OPEN REPAIR LEFT INGUINAL HERNIA ;  Surgeon: Fanny Skates, MD;  Location: [facility];  Service: General;  Laterality: Left;   INSERTION OF MESH Left 03/24/2015   Procedure: INSERTION OF MESH;  Surgeon: Fanny Skates, MD;  Location: [place];  Service: General;  Laterality: Left;   SHOULDER ARTHROSCOPY W/ ROTATOR CUFF REPAIR  2011   right   SPINE SURGERY  02/12/1993   L2, L3 fragmented disc   [place]  09/2018   Vein injections        Family History  Problem Relation Age of Onset   Macular degeneration Mother    COPD Mother    Non-Hodgkin's lymphoma Mother    Lung cancer Mother        Per [place] New Patient Packet    Hypertension Father     Suicidality Father 83       Per [place] New Patient Packet    Depression Father    Angina Father        Per records from [place]    Diverticulosis Father        Per records from [place] disease Father    High Cholesterol Sister    Macular degeneration Maternal Grandmother    Schizophrenia Son    Bipolar disorder Son    Autism Son    Post-traumatic stress disorder Son    Diabetes type II Daughter    Breast cancer Neg Hx    Colon cancer Neg Hx    Esophageal cancer Neg Hx    Stomach cancer Neg Hx    Liver disease Neg Hx    Pancreatic cancer Neg Hx     Social History   Tobacco Use   Smoking status: Former    Years: 12.00    Types: Cigarettes    Quit date: 02/15/1974    Years since quitting: 46.6   Smokeless tobacco: Never  Vaping Use   Vaping Use: Never used  Substance Use Topics   Alcohol use: Yes    Alcohol/week: 14.0 standard drinks    Types: 14 Standard drinks or equivalent per week    Comment: 14 drinks weekly in the evening    Drug use: No    Home Medications Prior to Admission medications   Medication Sig Start Date End Date Taking? Authorizing Provider  tamsulosin (FLOMAX) 0.4 MG CAPS capsule Take 1 capsule (0.4 mg total) by mouth daily. 10/02/20  Yes Nuala Alpha A, PA-C  aspirin 81 MG tablet Take 1 tablet (81 mg total) by mouth daily. 06/11/14   Belva Crome, MD  atorvastatin (LIPITOR) 80 MG tablet TAKE 1 TABLET BY MOUTH  DAILY 08/07/20   Lauree Chandler, NP  carvedilol (COREG) 12.5 MG tablet TAKE 1 TABLET BY MOUTH  TWICE DAILY . NEEDS TO KEEP UPCOMING APPOINTMENT IN  JULY FOR FURTHER REFILLS 08/07/20   Belva Crome, MD  hydrochlorothiazide (MICROZIDE) 12.5 MG capsule Take 1 capsule (12.5 mg total) by mouth daily. 03/20/20   Belva Crome, MD  Multiple Vitamin (MULTIVITAMIN WITH MINERALS) TABS tablet Take 1 tablet by mouth daily.    [provider]  naproxen sodium (ALEVE) 220 MG tablet Take 220 mg by mouth as needed.     [provider]  OMEPRAZOLE PO Take 20 mg by mouth every morning.     [provider]  ramipril (ALTACE) 10 MG capsule Take 1 capsule (10 mg total) by mouth daily. 09/05/20   Belva Crome, MD  sertraline (ZOLOFT) 50 MG tablet  TAKE 1 TABLET BY MOUTH  DAILY 12/27/19   Lauree Chandler, NP    Allergies    Patient has no known allergies.  Review of Systems   Review of Systems Ten systems are reviewed and are negative for acute change except as noted in the HPI  Physical Exam Updated Vital Signs BP (!) 178/80 (BP Location: Left Arm) Comment: EDP aware, pt needs to take his home medicines  Pulse (!) 54   Temp 98 F (36.7 C) (Oral)   Resp 19   SpO2 100%   Physical Exam Constitutional:      General: He is not in acute distress.    Appearance: Normal appearance. He is well-developed. He is not ill-appearing or diaphoretic.  HENT:     Head: Normocephalic and atraumatic.  Eyes:     General: Vision grossly intact. Gaze aligned appropriately.     Pupils: Pupils are equal, round, and reactive to light.  Neck:     Trachea: Trachea and phonation normal.  Cardiovascular:     Rate and Rhythm: Normal rate and regular rhythm.     Pulses: Normal pulses.          Radial pulses are 2+ on the right side and 2+ on the left side.       Dorsalis pedis pulses are 2+ on the right side and 2+ on the left side.  Pulmonary:     Effort: Pulmonary effort is normal. No respiratory distress.     Breath sounds: Normal breath sounds.  Abdominal:     General: There is no distension.     Palpations: Abdomen is soft.     Tenderness: There is no abdominal tenderness. There is no guarding or rebound.  Musculoskeletal:        General: Normal range of motion.     Cervical back: Normal range of motion.     Comments: No midline spinal tenderness palpation.  No crepitus step-off or deformity of the spine.  Bilateral thoracic paraspinal muscular tenderness to palpation without overlying skin  change.  Skin:    General: Skin is warm and dry.  Neurological:     Mental Status: He is alert.     GCS: GCS eye subscore is 4. GCS verbal subscore is 5. GCS motor subscore is 6.     Comments: Speech is clear and goal oriented, follows commands Major Cranial nerves without deficit, no facial droop Moves extremities without ataxia, coordination intact  Psychiatric:        Behavior: Behavior normal.    ED Results / Procedures / Treatments   Labs (all labs ordered are listed, but only abnormal results are displayed) Labs Reviewed  COMPREHENSIVE METABOLIC PANEL - Abnormal; Notable for the following components:      Result Value   Glucose, Bld 115 (*)    BUN 31 (*)    Total Protein 6.4 (*)    All other components within normal limits  URINALYSIS, ROUTINE W REFLEX MICROSCOPIC - Abnormal; Notable for the following components:   Color, Urine STRAW (*)    Specific Gravity, Urine 1.038 (*)    All other components within normal limits  CBC WITH DIFFERENTIAL/PLATELET  LIPASE, BLOOD  TROPONIN I (HIGH SENSITIVITY)  TROPONIN I (HIGH SENSITIVITY)    EKG EKG Interpretation  Date/Time:  Thursday October 02 2020 03:00:23 EDT Ventricular Rate:  51 PR Interval:  162 QRS Duration: 96 QT Interval:  434 QTC Calculation: 400 R Axis:   29 Text Interpretation: Sinus bradycardia Otherwise  normal ECG Confirmed by Godfrey Pick [phone]) on 10/02/2020 9:08:51 AM  Radiology DG Chest Portable 1 View  Result Date: 10/02/2020 CLINICAL DATA:  Back pain and lightheadedness.  Concern for ACS. EXAM: PORTABLE CHEST 1 VIEW COMPARISON:  None. FINDINGS: No focal consolidation, pleural effusion, or pneumothorax. The cardiac silhouette is within limits. Degenerative changes of the spine and scoliosis. No acute osseous pathology. IMPRESSION: No active disease. Electronically Signed   By: Anner Crete M.D.   On: 10/02/2020 03:49   CT Angio Chest/Abd/Pel for Dissection W and/or Wo Contrast  Result Date:  10/02/2020 CLINICAL DATA:  Acute pain between the shoulder blades. EXAM: CT ANGIOGRAPHY CHEST, ABDOMEN AND PELVIS TECHNIQUE: Non-contrast CT of the chest was initially obtained. Multidetector CT imaging through the chest, abdomen and pelvis was performed using the standard protocol during bolus administration of intravenous contrast. Multiplanar reconstructed images and MIPs were obtained and reviewed to evaluate the vascular anatomy. CONTRAST:  160m OMNIPAQUE IOHEXOL 350 MG/ML SOLN COMPARISON:  Chest x-ray from same day. FINDINGS: CTA CHEST FINDINGS Cardiovascular: Preferential opacification of the thoracic aorta. No evidence of thoracic aortic aneurysm or dissection. No intramural hematoma. Coronary, aortic arch, and branch vessel atherosclerotic vascular disease. Mild cardiomegaly. No pericardial effusion. No pulmonary embolism. Mediastinum/Nodes: No enlarged mediastinal, hilar, or axillary lymph nodes. Thyroid gland, trachea, and esophagus demonstrate no significant findings. Lungs/Pleura: No focal consolidation, pleural effusion, or pneumothorax. 5 mm subpleural nodule in the left lower lobe (series 6, image 47). Musculoskeletal: No chest wall abnormality. No acute or significant osseous findings. Review of the MIP images confirms the above findings. CTA ABDOMEN AND PELVIS FINDINGS VASCULAR Aorta: Normal caliber aorta without aneurysm, dissection, vasculitis or significant stenosis. Atherosclerotic calcification. Celiac: Patent without evidence of aneurysm, dissection, vasculitis or significant stenosis. SMA: Patent without evidence of aneurysm, dissection, vasculitis or significant stenosis. Replaced right hepatic artery. Renals: Two left and two right renal arteries are patent without evidence of aneurysm, dissection, vasculitis, fibromuscular dysplasia or significant stenosis. IMA: Patent without evidence of aneurysm, dissection, vasculitis or significant stenosis. Inflow: Patent without evidence of  aneurysm, dissection, vasculitis or significant stenosis. Veins: No obvious venous abnormality within the limitations of this arterial phase study. Review of the MIP images confirms the above findings. NON-VASCULAR Hepatobiliary: No focal liver abnormality is seen. No gallstones, gallbladder wall thickening, or biliary dilatation. Pancreas: Unremarkable. No pancreatic ductal dilatation or surrounding inflammatory changes. Spleen: Normal in size without focal abnormality. Adrenals/Urinary Tract: Adrenal glands are unremarkable. 4 mm calculus in the distal right ureter just proximal to the UVJ. No hydronephrosis. The bladder is unremarkable. Stomach/Bowel: Stomach is within normal limits. Appendix appears normal. No evidence of bowel wall thickening, distention, or inflammatory changes. Lymphatic: No enlarged abdominal or pelvic lymph nodes. Reproductive: Prostate is unremarkable. Other: No free fluid or pneumoperitoneum. Musculoskeletal: No acute or significant osseous findings. Chronic bilateral L5 pars defects with grade 1 anterolisthesis at L5-S1. Review of the MIP images confirms the above findings. IMPRESSION: 1. No evidence of aortic dissection or aneurysm. 2. 4 mm calculus in the distal right ureter just proximal to the UVJ. No hydronephrosis. 3. 5 mm subpleural nodule in the left lower lobe. No follow-up needed if patient is low-risk. Non-contrast chest CT can be considered in 12 months if patient is high-risk. This recommendation follows the consensus statement: Guidelines for Management of Incidental Pulmonary Nodules Detected on CT Images: From the Fleischner Society 2017; Radiology 2017; 284:228-243. 4. Aortic Atherosclerosis (ICD10-I70.0). Electronically Signed   By: WOrville GovernD.  On: 10/02/2020 12:37    Procedures Procedures   Medications Ordered in ED Medications  sodium chloride 0.9 % bolus 500 mL (0 mLs Intravenous Stopped 10/02/20 1142)  iohexol (OMNIPAQUE) 350 MG/ML injection 100 mL  (100 mLs Intravenous Contrast Given 10/02/20 1155)    ED Course  I have reviewed the triage vital signs and the nursing notes.  Pertinent labs & imaging results that were available during my care of the patient were reviewed by me and considered in my medical decision making (see chart for details).    MDM Rules/Calculators/A&P                          Additional history obtained from: Nursing notes from this visit. Review of EMR. --------------- I ordered, reviewed and interpreted labs which include: Lipase within normal limits, doubt pancreatitis. Initial and delta high-sensitivity troponins within normal limits, doubt ACS. CMP without emergent electrolyte derangement, AKI, LFT elevations or gap. CBC without leukocytosis to suggest infection.  No anemia or thrombocytopenia. Urinalysis without evidence for infection.  Chest x-ray IMPRESSION:  No active disease.   EKG: Sinus bradycardia Otherwise normal ECG Confirmed by Godfrey Pick ([id number]) on 10/02/2020 9:08:51 AM  CT Angio Chest/Abd/Pelvis Dissection study: IMPRESSION:  1. No evidence of aortic dissection or aneurysm.  2. 4 mm calculus in the distal right ureter just proximal to the  UVJ. No hydronephrosis.  3. 5 mm subpleural nodule in the left lower lobe. No follow-up  needed if patient is low-risk. Non-contrast chest CT can be  considered in 12 months if patient is high-risk. This recommendation  follows the consensus statement: Guidelines for Management of  Incidental Pulmonary Nodules Detected on CT Images: From the  Fleischner Society 2017; Radiology 2017; 284:228-243.  4. Aortic Atherosclerosis (ICD10-I70.0).  ========= Patient reassessed resting comfortably, sitting in chair no acute distress.  Appears patient's symptoms may be due to the kidney stone which he is currently passing versus musculoskeletal back pain.  Urinalysis does not appear infected and there is no need for admission in regards to the stone at this  time.  Patient reports his pain is continue to resolve without any intervention.  Low suspicion for ACS, PE or other emergent cardiopulmonary process at this time in regards to his back pain.  Patient is tender to the bilateral paraspinal muscles which may contributed to his symptoms or may be due to kidney stone.  He has no neurologic complaint to be concerning for cauda equina, spinal epidural abscess or other emergent pathology.  Patient will be given [facility] as well as a prescription for Flomax to help with the symptoms.  Discussed additional pain medication to help with kidney stone versus musculoskeletal pain patient declined and feels his pain is well controlled without those medications.  I encourage patient to call his primary care provider today to schedule follow-up appointment.  Patient was made aware of incidental CT findings and to discuss them with his PCP at the follow-up visit.  At this time there does not appear to be any evidence of an acute emergency medical condition and the patient appears stable for discharge with appropriate outpatient follow up. Diagnosis was discussed with patient who verbalizes understanding of care plan and is agreeable to discharge. I have discussed return precautions with patient who verbalizes understanding. Patient encouraged to follow-up with their PCP and urology. All questions answered.  Patient seen and evaluated by Dr. Doren Custard during this visit who agrees with  plan and discharge.  Note: Portions of this report may have been transcribed using voice recognition software. Every effort was made to ensure accuracy; however, inadvertent computerized transcription errors may still be present.  Final Clinical Impression(s) / ED Diagnoses Final diagnoses:  Kidney stone  Elevated blood pressure reading    Rx / DC Orders ED Discharge Orders          Ordered    tamsulosin (FLOMAX) 0.4 MG CAPS capsule  Daily        10/02/20 1413              Gari Crown 10/02/20 1421    Godfrey Pick, MD 10/02/20 1816

## 2020-10-02 NOTE — ED Notes (Signed)
Pt requesting to sit up in chair instead of stretcher. Ambulatory in room

## 2020-10-02 NOTE — Discharge Instructions (Addendum)
At this time there does not appear to be the presence of an emergent medical condition, however there is always the potential for conditions to change. Please read and follow the below instructions.  Please return to the Emergency Department immediately for any new or worsening symptoms. Please be sure to follow up with your Primary Care Provider within one week regarding your visit today; please call their office to schedule an appointment even if you are feeling better for a follow-up visit. Your CT scan today showed a 4 mm kidney stone in your right ureter.  Please call the specialist at [facility] urology for further evaluation and treatment.  You may strain your urine to see if you find the stone.  You may take the medication Flomax as prescribed to help facilitate stone passage. Your CT scan today also showed a 5 mm subpleural nodule in your left lower lobe, please discuss this with your primary care provider as you may need a repeat CT scan of your chest in 12 months for further evaluation. Your CT scan today also showed aortic atherosclerosis, mild cardiomegaly, chronic bilateral L5 pars defect with grade 1 anterior listhesis.  You may discuss these findings along with the remainder of your CT scan with your primary care provider at your follow-up visit.  Your blood pressure was also slightly elevated during the ER visit today.  Please be sure to take all of your blood pressure medications as prescribed and have your blood pressure rechecked at your follow-up visit  Go to the nearest Emergency Department immediately if: You have fever or chills You get very bad pain. You get new pain in your belly (abdomen). You pass out (faint). You cannot pee. You have any new/concerning or worsening of symptoms.   Please read the additional information packets attached to your discharge summary.  Do not take your medicine if  develop an itchy rash, swelling in your mouth or lips, or difficulty breathing;  call 911 and seek immediate emergency medical attention if this occurs.  You may review your lab tests and imaging results in their entirety on your MyChart account.  Please discuss all results of fully with your primary care provider and other specialist at your follow-up visit.  Note: Portions of this text may have been transcribed using voice recognition software. Every effort was made to ensure accuracy; however, inadvertent computerized transcription errors may still be present.

## 2020-11-05 ENCOUNTER — Other Ambulatory Visit: Payer: Medicare Other

## 2020-11-06 ENCOUNTER — Other Ambulatory Visit: Payer: Medicare Other

## 2020-11-06 ENCOUNTER — Other Ambulatory Visit: Payer: Self-pay

## 2020-11-06 DIAGNOSIS — E785 Hyperlipidemia, unspecified: Secondary | ICD-10-CM | POA: Diagnosis not present

## 2020-11-06 DIAGNOSIS — R739 Hyperglycemia, unspecified: Secondary | ICD-10-CM | POA: Diagnosis not present

## 2020-11-06 DIAGNOSIS — I10 Essential (primary) hypertension: Secondary | ICD-10-CM | POA: Diagnosis not present

## 2020-11-07 LAB — LIPID PANEL
Cholesterol: 152 mg/dL (ref <200)
HDL: 69 mg/dL (ref >=40)
LDL Cholesterol (Calc): 69 mg/dL (calc)
Non-HDL Cholesterol (Calc): 83 mg/dL (calc) (ref <130)
Total CHOL/HDL Ratio: 2.2 (calc) (ref <5.0)
Triglycerides: 64 mg/dL (ref <150)

## 2020-11-07 LAB — COMPLETE METABOLIC PANEL WITH GFR
AG Ratio: 1.6 (calc) (ref 1.0–2.5)
ALT: 24 U/L (ref 9–46)
AST: 22 U/L (ref 10–35)
Albumin: 4 g/dL (ref 3.6–5.1)
Alkaline phosphatase (APISO): 69 U/L (ref 35–144)
BUN/Creatinine Ratio: 31 (calc) — ABNORMAL HIGH (ref 6–22)
BUN: 30 mg/dL — ABNORMAL HIGH (ref 7–25)
CO2: 25 mmol/L (ref 20–32)
Calcium: 9 mg/dL (ref 8.6–10.3)
Chloride: 108 mmol/L (ref 98–110)
Creat: 0.98 mg/dL (ref 0.70–1.28)
Globulin: 2.5 g/dL (calc) (ref 1.9–3.7)
Glucose, Bld: 132 mg/dL — ABNORMAL HIGH (ref 65–99)
Potassium: 4.2 mmol/L (ref 3.5–5.3)
Sodium: 141 mmol/L (ref 135–146)
Total Bilirubin: 0.6 mg/dL (ref 0.2–1.2)
Total Protein: 6.5 g/dL (ref 6.1–8.1)
eGFR: 80 mL/min/{1.73_m2} (ref >=60)

## 2020-11-07 LAB — HEMOGLOBIN A1C
Hgb A1c MFr Bld: 5.8 % of total Hgb — ABNORMAL HIGH (ref <5.7)
Mean Plasma Glucose: 120 mg/dL
eAG (mmol/L): 6.6 mmol/L

## 2020-11-07 LAB — CBC WITH DIFFERENTIAL/PLATELET
Absolute Monocytes: 653 cells/uL (ref 200–950)
Basophils Absolute: 51 cells/uL (ref 0–200)
Basophils Relative: 0.8 %
Eosinophils Absolute: 166 cells/uL (ref 15–500)
Eosinophils Relative: 2.6 %
HCT: 42.4 % (ref 38.5–50.0)
Hemoglobin: 14 g/dL (ref 13.2–17.1)
Lymphs Abs: 1146 cells/uL (ref 850–3900)
MCH: 31.7 pg (ref 27.0–33.0)
MCHC: 33 g/dL (ref 32.0–36.0)
MCV: 95.9 fL (ref 80.0–100.0)
MPV: 10 fL (ref 7.5–12.5)
Monocytes Relative: 10.2 %
Neutro Abs: 4384 cells/uL (ref 1500–7800)
Neutrophils Relative %: 68.5 %
Platelets: 194 10*3/uL (ref 140–400)
RBC: 4.42 10*6/uL (ref 4.20–5.80)
RDW: 12.2 % (ref 11.0–15.0)
Total Lymphocyte: 17.9 %
WBC: 6.4 10*3/uL (ref 3.8–10.8)

## 2020-11-10 ENCOUNTER — Other Ambulatory Visit: Payer: Self-pay

## 2020-11-10 ENCOUNTER — Encounter: Payer: Self-pay | Admitting: Nurse Practitioner

## 2020-11-10 ENCOUNTER — Ambulatory Visit (INDEPENDENT_AMBULATORY_CARE_PROVIDER_SITE_OTHER): Payer: Medicare Other | Admitting: Nurse Practitioner

## 2020-11-10 ENCOUNTER — Other Ambulatory Visit: Payer: Medicare Other

## 2020-11-10 VITALS — BP 130/78 | HR 59 | Temp 97.3°F | Ht 67.0 in | Wt 195.0 lb

## 2020-11-10 DIAGNOSIS — I25118 Atherosclerotic heart disease of native coronary artery with other forms of angina pectoris: Secondary | ICD-10-CM

## 2020-11-10 DIAGNOSIS — I700 Atherosclerosis of aorta: Secondary | ICD-10-CM | POA: Diagnosis not present

## 2020-11-10 DIAGNOSIS — F325 Major depressive disorder, single episode, in full remission: Secondary | ICD-10-CM | POA: Diagnosis not present

## 2020-11-10 DIAGNOSIS — E785 Hyperlipidemia, unspecified: Secondary | ICD-10-CM

## 2020-11-10 DIAGNOSIS — Z23 Encounter for immunization: Secondary | ICD-10-CM

## 2020-11-10 DIAGNOSIS — I10 Essential (primary) hypertension: Secondary | ICD-10-CM

## 2020-11-10 DIAGNOSIS — R739 Hyperglycemia, unspecified: Secondary | ICD-10-CM | POA: Diagnosis not present

## 2020-11-10 DIAGNOSIS — G4733 Obstructive sleep apnea (adult) (pediatric): Secondary | ICD-10-CM

## 2020-11-10 DIAGNOSIS — M79672 Pain in left foot: Secondary | ICD-10-CM | POA: Diagnosis not present

## 2020-11-10 NOTE — Progress Notes (Signed)
Careteam: Patient Care Team: Lauree Chandler, NP as PCP - General (Geriatric Medicine) Fanny Skates, MD as Consulting Physician (General Surgery) Marchia Bond, MD as Consulting Physician (Orthopedic Surgery) Jarome Matin, MD as Consulting Physician (Dermatology) Belva Crome, MD as Consulting Physician (Cardiology) Kathrynn Ducking, MD as Consulting Physician (Neurology) Fonnie Mu, OD (Optometry)  PLACE OF SERVICE:  [place]  Advanced Directive information Does Patient Have a Medical Advance Directive?: Yes, Type of Advance Directive: Out of facility DNR (pink MOST or yellow form), Pre-existing out of facility DNR order (yellow form or pink MOST form): Pink MOST form placed in chart (order not valid for inpatient use), Does patient want to make changes to medical advance directive?: No - Patient declined  No Known Allergies  Chief Complaint  Patient presents with   Medical Management of Chronic Issues    6 month follow-up and discuss labs (patient viewed online). Flu vaccine today      HPI: Patient is a 75 y.o. male for routine follow up  Renovating a house for his daughter. Keeping him busy Eating more for convenience- plans to do better.   Walked over 4 miles this morning.   He is having pain in the side of his foot. Radiates to the top of his foot. Did get so bad he could not walk.  Pain got better as he walked today. It has been persistent for about a month or so. No injury.  Reports pain is tolerable at time and then will get worse.   He went to ED on 8/18 due to severe pain between shoulder blades. Thought he was having a heart attack.  Found kidney stone. He was placed on flomax for 1 week.  Unsure if it has passed or not. Has not had any pain.  Has had 3 kidney stones in the past.   Was having some exertional shortness of breath. This has since improved. Had repeat stress test.   OSA- does not use cpap- his wife has not noticed any issues .    Review of Systems:  Review of Systems  Constitutional:  Negative for chills, fever and weight loss.  HENT:  Negative for tinnitus.   Respiratory:  Negative for cough, sputum production and shortness of breath.   Cardiovascular:  Negative for chest pain, palpitations and leg swelling.  Gastrointestinal:  Negative for abdominal pain, constipation, diarrhea and heartburn.  Genitourinary:  Negative for dysuria, frequency and urgency.  Musculoskeletal:  Positive for myalgias. Negative for back pain, falls and joint pain.       Left foot pain   Skin: Negative.   Neurological:  Negative for dizziness and headaches.  Psychiatric/Behavioral:  Negative for depression and memory loss. The patient does not have insomnia.    Past Medical History:  Diagnosis Date   Arthritis    lt ankle   Breast lump    Per records from [place]    Colon polyp    Coronary artery disease    a. BMS to RCA 2003 with residual LAD/diag disease treated medically, normal EF.   Depression    Diverticulosis    Per records from [place]    GERD (gastroesophageal reflux disease)    Hyperkalemia    a. K of 5.2 in 2017.   Hyperlipidemia    Hypertension    Idiopathic peripheral neuropathy    Per records from [facility]    Impaired fasting glucose    Per records from [facility]  Kidney stones    Monoclonal gammopathy of undetermined significance    Per records from [place]    Myocardial infarction [facility]) 11/15/2001   Dr. Daneen Schick [facility])   OSA (obstructive sleep apnea)    Per [facility] New Patient Packet   Peripheral neuropathy    Left Foot, Per [place] New Patient Packet   Pre-diabetes    Sleep apnea    had test several yr ago-said he did not need a cpap-still snores   Torn rotator cuff    Per [facility] New Patient Packet   Venous insufficiency of left leg    Per [facility] New Patient Packet   Wears glasses    Past Surgical History:  Procedure Laterality Date   ANGIOPLASTY   2003   Per records from [place]   stent rca   CARDIOVASCULAR STRESS TEST  02/16/2016   Per records from [place]  06/17/2003   Per records from [place], Dr.Ganem to be repeated 2015   coronary artery stent  11/15/2001   CORONARY STENT PLACEMENT  11/15/2001   Per records from [place] Left 05/08/2013   Procedure: CYST REMOVAL BACK;  Surgeon: Adin Hector, MD;  Location: [place];  Service: General;  Laterality: Left;   INGUINAL HERNIA REPAIR Left 03/24/2015   Procedure: OPEN REPAIR LEFT INGUINAL HERNIA ;  Surgeon: Fanny Skates, MD;  Location: [place];  Service: General;  Laterality: Left;   INSERTION OF MESH Left 03/24/2015   Procedure: INSERTION OF MESH;  Surgeon: Fanny Skates, MD;  Location: [place];  Service: General;  Laterality: Left;   SHOULDER ARTHROSCOPY W/ ROTATOR CUFF REPAIR  2011   right   SPINE SURGERY  02/12/1993   L2, L3 fragmented disc   [place]  09/2018   Vein injections    Social History:   reports that he quit smoking about 46 years ago. His smoking use included cigarettes. He has never used smokeless tobacco. He reports current alcohol use of about 14.0 standard drinks per week. He reports that he does not use drugs.  Family History  Problem Relation Age of Onset   Macular degeneration Mother    COPD Mother    Non-Hodgkin's lymphoma Mother    Lung cancer Mother        Per [place] New Patient Packet    Hypertension Father    Suicidality Father 56       Per [place] New Patient Packet    Depression Father    Angina Father        Per records from [place]    Diverticulosis Father        Per records from [place] disease Father    High Cholesterol Sister    Macular degeneration Maternal Grandmother    Schizophrenia Son    Bipolar disorder Son    Autism Son     Post-traumatic stress disorder Son    Diabetes type II Daughter    Breast cancer Neg Hx    Colon cancer Neg Hx    Esophageal cancer Neg Hx    Stomach cancer Neg Hx    Liver disease Neg Hx    Pancreatic cancer Neg Hx     Medications: Patient's Medications  New Prescriptions   No medications on file  Previous Medications  ASPIRIN 81 MG TABLET    Take 1 tablet (81 mg total) by mouth daily.   ATORVASTATIN (LIPITOR) 80 MG TABLET    TAKE 1 TABLET BY MOUTH  DAILY   CARVEDILOL (COREG) 12.5 MG TABLET    TAKE 1 TABLET BY MOUTH  TWICE DAILY . NEEDS TO KEEP UPCOMING APPOINTMENT IN  JULY FOR FURTHER REFILLS   HYDROCHLOROTHIAZIDE (MICROZIDE) 12.5 MG CAPSULE    Take 1 capsule (12.5 mg total) by mouth daily.   MULTIPLE VITAMIN (MULTIVITAMIN WITH MINERALS) TABS TABLET    Take 1 tablet by mouth daily.   OMEPRAZOLE PO    Take 20 mg by mouth every morning.    RAMIPRIL (ALTACE) 10 MG CAPSULE    Take 1 capsule (10 mg total) by mouth daily.   SERTRALINE (ZOLOFT) 50 MG TABLET    TAKE 1 TABLET BY MOUTH  DAILY  Modified Medications   No medications on file  Discontinued Medications   NAPROXEN SODIUM (ALEVE) 220 MG TABLET    Take 220 mg by mouth as needed.   TAMSULOSIN (FLOMAX) 0.4 MG CAPS CAPSULE    Take 1 capsule (0.4 mg total) by mouth daily.    Physical Exam:  Vitals:   11/10/20 1135  BP: 130/78  Pulse: (!) 59  Temp: (!) 97.3 F (36.3 C)  TempSrc: Temporal  SpO2: 97%  Weight: 195 lb (88.5 kg)  Height: '5\' 7"'  (1.702 m)   Body mass index is 30.54 kg/m. Wt Readings from Last 3 Encounters:  11/10/20 195 lb (88.5 kg)  09/15/20 189 lb (85.7 kg)  09/05/20 189 lb 6.4 oz (85.9 kg)    Physical Exam Constitutional:      General: He is not in acute distress.    Appearance: He is well-developed. He is not diaphoretic.  HENT:     Head: Normocephalic and atraumatic.     Right Ear: External ear normal.     Left Ear: External ear normal.     Mouth/Throat:     Pharynx: No oropharyngeal exudate.   Eyes:     Conjunctiva/sclera: Conjunctivae normal.     Pupils: Pupils are equal, round, and reactive to light.  Cardiovascular:     Rate and Rhythm: Normal rate and regular rhythm.     Heart sounds: Normal heart sounds.  Pulmonary:     Effort: Pulmonary effort is normal.     Breath sounds: Normal breath sounds.  Abdominal:     General: Bowel sounds are normal.     Palpations: Abdomen is soft.  Musculoskeletal:        General: No tenderness.     Cervical back: Normal range of motion and neck supple.     Right lower leg: No edema.     Left lower leg: No edema.       Feet:  Feet:     Comments: Tenderness noted.  Skin:    General: Skin is warm and dry.  Neurological:     Mental Status: He is alert and oriented to person, place, and time.    Labs reviewed: Basic Metabolic Panel: Recent Labs    05/08/20 0830 10/02/20 0301 11/06/20 0831  NA 139 136 141  K 4.4 4.4 4.2  CL 103 104 108  CO2 '25 25 25  ' GLUCOSE 137* 115* 132*  BUN 27* 31* 30*  CREATININE 1.03 1.02 0.98  CALCIUM 9.5 9.1 9.0   Liver Function Tests: Recent Labs    05/08/20 0830 10/02/20 0301 11/06/20 0831  AST 20 25  22  ALT '19 25 24  ' ALKPHOS  --  56  --   BILITOT 0.6 0.9 0.6  PROT 6.7 6.4* 6.5  ALBUMIN  --  3.5  --    Recent Labs    10/02/20 0301  LIPASE 32   No results for input(s): AMMONIA in the last 8760 hours. CBC: Recent Labs    05/08/20 0830 10/02/20 0301 11/06/20 0831  WBC 7.1 6.7 6.4  NEUTROABS 5,041 4.4 4,384  HGB 14.8 14.2 14.0  HCT 44.0 43.1 42.4  MCV 95.0 96.0 95.9  PLT 197 181 194   Lipid Panel: Recent Labs    05/08/20 0830 11/06/20 0831  CHOL 146 152  HDL 64 69  LDLCALC 66 69  TRIG 80 64  CHOLHDL 2.3 2.2   TSH: No results for input(s): TSH in the last 8760 hours. A1C: Lab Results  Component Value Date   HGBA1C 5.8 (H) 11/06/2020     Assessment/Plan 1. Need for influenza vaccination - Flu Vaccine QUAD High Dose(Fluad)  2. Aortic atherosclerosis  ([place]) -noted on imaging, continues on statin with asa  3. Major depressive disorder in full remission, unspecified whether recurrent ([place]) Stable on zoloft.   4. Atherosclerosis of native coronary artery of native heart with stable angina pectoris ([place]) -stable without chest pains continues on coreg, and ramipril   5. Obstructive sleep apnea Does not use cpap, wife reports he does not stop breathing in his sleep.   6. Left foot pain - Ambulatory referral to Podiatry  7. Hyperglycemia -continue dietary modifications.  - Hemoglobin A1c; Future  8. Hyperlipidemia, unspecified hyperlipidemia type -LDL at goal. Continue lipitor and dietary modifications.  - Lipid panel; Future - CMP with eGFR([facility]); Future  9. Essential hypertension, benign --stable. Goal bp <140/90. Continue on current regimen with low sodium diet.  - CMP with eGFR([facility]); Future - CBC with Differential/Platelet; Future   Next appt: 6 months, labs prior to visit.  Carlos American. [place], [facility] [phone]

## 2020-11-12 ENCOUNTER — Ambulatory Visit: Payer: Medicare Other | Admitting: Nurse Practitioner

## 2020-11-24 ENCOUNTER — Ambulatory Visit: Payer: Medicare Other | Admitting: Podiatry

## 2020-11-24 ENCOUNTER — Encounter: Payer: Self-pay | Admitting: Podiatry

## 2020-11-24 ENCOUNTER — Other Ambulatory Visit: Payer: Self-pay | Admitting: Interventional Cardiology

## 2020-11-24 ENCOUNTER — Other Ambulatory Visit: Payer: Self-pay

## 2020-11-24 ENCOUNTER — Other Ambulatory Visit: Payer: Self-pay | Admitting: Nurse Practitioner

## 2020-11-24 ENCOUNTER — Ambulatory Visit (INDEPENDENT_AMBULATORY_CARE_PROVIDER_SITE_OTHER): Payer: Medicare Other

## 2020-11-24 DIAGNOSIS — M2142 Flat foot [pes planus] (acquired), left foot: Secondary | ICD-10-CM | POA: Diagnosis not present

## 2020-11-24 DIAGNOSIS — M7752 Other enthesopathy of left foot: Secondary | ICD-10-CM

## 2020-11-24 DIAGNOSIS — M7750 Other enthesopathy of unspecified foot: Secondary | ICD-10-CM

## 2020-11-24 DIAGNOSIS — M2141 Flat foot [pes planus] (acquired), right foot: Secondary | ICD-10-CM

## 2020-11-24 DIAGNOSIS — M76822 Posterior tibial tendinitis, left leg: Secondary | ICD-10-CM | POA: Diagnosis not present

## 2020-11-24 NOTE — Progress Notes (Signed)
  Subjective:  Patient ID: Tim Walters, male    DOB: 1945/09/14,  MRN: [id number]  Chief Complaint  Patient presents with   Foot Pain     (np) Left foot pain    75 y.o. male presents with the above complaint. History confirmed with patient.  Says been going on worsening for several months.  He tries to be very active and walks about 4 miles a day.  Objective:  Physical Exam: warm, good capillary refill, no trophic changes or ulcerative lesions, normal DP and PT pulses, and normal sensory exam.  Bilaterally he has collapsing pes planovalgus deformity of both arches on weightbearing, on the left side he has pain on palpation and with resisted inversion on the posterior tibial tendon along its course and at its insertion  Radiographs: Multiple views x-ray of the left foot: Moderate pes planus valgus deformity Assessment:   1. Posterior tibial tendinitis of left lower extremity   2. Pes planus of both feet      Plan:  Patient was evaluated and treated and all questions answered.  Discussed the etiology and treatment options for posterior tibial tendinitis including stretching, formal physical therapy with an eccentric exercises therapy plan, supportive shoegears such as a running shoe or sneaker, bracing, topical and oral medications.   We also discussed the role of surgical treatment of this for patients who do not improve after exhausting non-surgical treatment options.  -XR reviewed with patient -Educated on stretching and icing of the affected limb.  Home exercise plan given -Placed in Tri-Lock brace wear this during his activity, advised to decrease mileage by about half for now and then increase gradually after 4 weeks -OTC NSAIDs as needed for pain -We also discussed orthotics and supportive this with a longitudinal arch support.  He will consider this for the future. -May need boot immobilization and physical therapy and MRI if not improving  Return in about 6 weeks  (around 01/05/2021) for re-check PT tendon.

## 2021-01-06 ENCOUNTER — Ambulatory Visit: Payer: Medicare Other | Admitting: Podiatry

## 2021-01-06 ENCOUNTER — Other Ambulatory Visit: Payer: Self-pay

## 2021-01-06 DIAGNOSIS — M2142 Flat foot [pes planus] (acquired), left foot: Secondary | ICD-10-CM

## 2021-01-06 DIAGNOSIS — M76822 Posterior tibial tendinitis, left leg: Secondary | ICD-10-CM | POA: Diagnosis not present

## 2021-01-06 DIAGNOSIS — M2141 Flat foot [pes planus] (acquired), right foot: Secondary | ICD-10-CM

## 2021-01-06 NOTE — Progress Notes (Signed)
  Subjective:  Patient ID: Tim Walters, male    DOB: 1945-03-04,  MRN: [id number]  Chief Complaint  Patient presents with   Tendonitis    6 week follow up  PT tendonitis left    75 y.o. male presents with the above complaint. History confirmed with patient.  Is doing much better has been doing exercises and wearing the Tri-Lock brace.  He is able to do 4 miles with the brace on.  Has not done much without the brace  Objective:  Physical Exam: warm, good capillary refill, no trophic changes or ulcerative lesions, normal DP and PT pulses, and normal sensory exam.  Bilaterally he has collapsing pes planovalgus deformity of both arches on weightbearing, good 5 out of 5 strength and no pain today  Radiographs: Multiple views x-ray of the left foot: Moderate pes planus valgus deformity Assessment:   1. Posterior tibial tendinitis of left lower extremity   2. Pes planus of both feet      Plan:  Patient was evaluated and treated and all questions answered.  Overall doing much better I recommend he continue to work his way out of the brace and increase his distance that he does not needed for this.  If he is unable to get to his regular activity without bracing then we will plan to have him casted for either a longitudinal foot orthosis with arch support to prevent collapse of the arch or a more custom ankle brace that is a mezzo brace.  He will let me know how he is doing  Return if symptoms worsen or fail to improve.

## 2021-02-25 ENCOUNTER — Other Ambulatory Visit: Payer: Self-pay | Admitting: Interventional Cardiology

## 2021-03-05 ENCOUNTER — Telehealth: Payer: Self-pay

## 2021-03-05 NOTE — Telephone Encounter (Signed)
DWP. Only symptoms is slight sinus congestion. No further questions.

## 2021-03-05 NOTE — Telephone Encounter (Signed)
No If he is not having any symptoms he should be fine. Sometimes people who take the antiviral can have a prolong case therefore If he still has symptoms needs to continues to quarantine

## 2021-03-05 NOTE — Telephone Encounter (Signed)
Patient called, [facility] stating he tested positive for covid on Jan. 3rd. He is still testing positive as of today. Symptoms are better. Anything should be doing? He did the 5 day antiviral already. Should he go back on it?  To Joelene Millin, NP

## 2021-04-22 DIAGNOSIS — L538 Other specified erythematous conditions: Secondary | ICD-10-CM | POA: Diagnosis not present

## 2021-04-22 DIAGNOSIS — L298 Other pruritus: Secondary | ICD-10-CM | POA: Diagnosis not present

## 2021-04-22 DIAGNOSIS — Z789 Other specified health status: Secondary | ICD-10-CM | POA: Diagnosis not present

## 2021-04-22 DIAGNOSIS — L820 Inflamed seborrheic keratosis: Secondary | ICD-10-CM | POA: Diagnosis not present

## 2021-04-22 DIAGNOSIS — R208 Other disturbances of skin sensation: Secondary | ICD-10-CM | POA: Diagnosis not present

## 2021-04-24 ENCOUNTER — Encounter: Payer: Self-pay | Admitting: Nurse Practitioner

## 2021-04-28 ENCOUNTER — Ambulatory Visit: Payer: Medicare Other | Admitting: Nurse Practitioner

## 2021-05-01 ENCOUNTER — Encounter: Payer: Medicare Other | Admitting: Nurse Practitioner

## 2021-05-05 ENCOUNTER — Ambulatory Visit (INDEPENDENT_AMBULATORY_CARE_PROVIDER_SITE_OTHER): Payer: Medicare Other | Admitting: Nurse Practitioner

## 2021-05-05 ENCOUNTER — Telehealth: Payer: Self-pay

## 2021-05-05 ENCOUNTER — Other Ambulatory Visit: Payer: Self-pay

## 2021-05-05 ENCOUNTER — Encounter: Payer: Self-pay | Admitting: Nurse Practitioner

## 2021-05-05 DIAGNOSIS — Z0000 Encounter for general adult medical examination without abnormal findings: Secondary | ICD-10-CM | POA: Diagnosis not present

## 2021-05-05 NOTE — Telephone Encounter (Signed)
Mr. forest, [place] are scheduled for a virtual visit with your provider today.   ? ?Just as we do with appointments in the office, we must obtain your consent to participate.  Your consent will be active for this visit and any virtual visit you may have with one of our providers in the next 365 days.   ? ?If you have a MyChart account, I can also send a copy of this consent to you electronically.  All virtual visits are billed to your insurance company just like a traditional visit in the office.  As this is a virtual visit, video technology does not allow for your provider to perform a traditional examination.  This may limit your provider's ability to fully assess your condition.  If your provider identifies any concerns that need to be evaluated in person or the need to arrange testing such as labs, EKG, etc, we will make arrangements to do so.   ? ?Although advances in technology are sophisticated, we cannot ensure that it will always work on either your end or our end.  If the connection with a video visit is poor, we may have to switch to a telephone visit.  With either a video or telephone visit, we are not always able to ensure that we have a secure connection.   I need to obtain your verbal consent now.   Are you willing to proceed with your visit today?  ? ?Andrae Claunch has provided verbal consent on 05/05/2021 for a virtual visit (video or telephone). ? ? ?Carroll Kinds, CMA ?05/05/2021  2:56 PM ?  ?

## 2021-05-05 NOTE — Progress Notes (Signed)
? ?Subjective:  ? Tim Walters is a 76 y.o. male who presents for Medicare Annual/Subsequent preventive examination. ? ?Review of Systems    ? ?Cardiac Risk Factors include: advanced age (>46mn, >>33women);hypertension;family history of premature cardiovascular disease;male gender;dyslipidemia;obesity (BMI >30kg/m2) ? ?   ?Objective:  ?  ?There were no vitals filed for this visit. ?There is no height or weight on file to calculate BMI. ? ?Advanced Directives 05/05/2021 11/10/2020 11/10/2020 05/12/2020 04/30/2020 11/12/2019 05/24/2019  ?Does Patient Have a Medical Advance Directive? Yes Yes Yes Yes Yes Yes No  ?Type of Advance Directive Out of facility DNR (pink MOST or yellow form) Out of facility DNR (pink MOST or yellow form) Out of facility DNR (pink MOST or yellow form) Out of facility DNR (pink MOST or yellow form) Out of facility DNR (pink MOST or yellow form) Out of facility DNR (pink MOST or yellow form) -  ?Does patient want to make changes to medical advance directive? No - Patient declined No - Patient declined No - Patient declined No - Patient declined No - Patient declined No - Patient declined -  ?Copy of [place] Chart? - - - - - - -  ?Would patient like information on creating a medical advance directive? - - - - - - -  ?Pre-existing out of facility DNR order (yellow form or pink MOST form) Pink MOST form placed in chart (order not valid for inpatient use) Pink MOST form placed in chart (order not valid for inpatient use) Pink MOST form placed in chart (order not valid for inpatient use) Yellow form placed in chart (order not valid for inpatient use);Pink MOST form placed in chart (order not valid for inpatient use) Yellow form placed in chart (order not valid for inpatient use);Pink MOST form placed in chart (order not valid for inpatient use) Pink MOST form placed in chart (order not valid for inpatient use) -  ? ? ?Current Medications (verified) ?Outpatient Encounter  Medications as of 05/05/2021  ?Medication Sig  ? aspirin 81 MG tablet Take 1 tablet (81 mg total) by mouth daily.  ? atorvastatin (LIPITOR) 80 MG tablet TAKE 1 TABLET BY MOUTH  DAILY  ? carvedilol (COREG) 12.5 MG tablet TAKE 1 TABLET BY MOUTH  TWICE DAILY  ? hydrochlorothiazide (MICROZIDE) 12.5 MG capsule TAKE 1 CAPSULE BY MOUTH  DAILY  ? Multiple Vitamin (MULTIVITAMIN WITH MINERALS) TABS tablet Take 1 tablet by mouth daily.  ? OMEPRAZOLE PO Take 20 mg by mouth every morning.   ? ramipril (ALTACE) 10 MG capsule Take 1 capsule (10 mg total) by mouth daily.  ? sertraline (ZOLOFT) 50 MG tablet TAKE 1 TABLET BY MOUTH  DAILY  ? ?No facility-administered encounter medications on file as of 05/05/2021.  ? ? ?Allergies (verified) ?Patient has no known allergies.  ? ?History: ?Past Medical History:  ?Diagnosis Date  ? Arthritis   ? lt ankle  ? Breast lump   ? Per records from [place]  ? Colon polyp   ? Coronary artery disease   ? a. BMS to RCA 2003 with residual LAD/diag disease treated medically, normal EF.  ? COVID   ? Depression   ? Diverticulosis   ? Per records from [place]  ? GERD (gastroesophageal reflux disease)   ? Hyperkalemia   ? a. K of 5.2 in 2017.  ? Hyperlipidemia   ? Hypertension   ? Idiopathic peripheral neuropathy   ? Per records from [facility]  ?  Impaired fasting glucose   ? Per records from [place]   ? Kidney stones   ? Monoclonal gammopathy of undetermined significance   ? Per records from [place]   ? Myocardial infarction ([place]) 11/15/2001  ? Dr. Daneen Schick [facility] Card[facility])  ? OSA (obstructive sleep apnea)   ? Per [facility] New Patient Packet  ? Peripheral neuropathy   ? Left Foot, Per [place] New Patient Packet  ? Pre-diabetes   ? Sleep apnea   ? had test several yr ago-said he did not need a cpap-still snores  ? Torn rotator cuff   ? Per [facility] New Patient Packet  ? Venous insufficiency of left leg   ? Per [facility] New Patient Packet  ? Wears glasses   ? ?Past Surgical  History:  ?Procedure Laterality Date  ? ANGIOPLASTY  2003  ? Per records from [place]   ? CARDIAC CATHETERIZATION  23003  ? stent rca  ? CARDIOVASCULAR STRESS TEST  02/16/2016  ? Per records from [place]   ? COLONOSCOPY  06/17/2003  ? Per records from [facility], Dr.Ganem to be repeated 2015  ? coronary artery stent  11/15/2001  ? CORONARY STENT PLACEMENT  11/15/2001  ? Per records from [place]   ? CYST REMOVAL TRUNK Left 05/08/2013  ? Procedure: CYST REMOVAL BACK;  Surgeon: Adin Hector, MD;  Location: [place];  Service: General;  Laterality: Left;  ? INGUINAL HERNIA REPAIR Left 03/24/2015  ? Procedure: OPEN REPAIR LEFT INGUINAL HERNIA ;  Surgeon: Fanny Skates, MD;  Location: [place];  Service: General;  Laterality: Left;  ? INSERTION OF MESH Left 03/24/2015  ? Procedure: INSERTION OF MESH;  Surgeon: Fanny Skates, MD;  Location: [place];  Service: General;  Laterality: Left;  ? SHOULDER ARTHROSCOPY W/ ROTATOR CUFF REPAIR  2011  ? right  ? SPINE SURGERY  02/12/1993  ? L2, L3 fragmented disc  ? VASECTOMY  1987  ? VEIN REPAIR  09/2018  ? Vein injections   ? ?Family History  ?Problem Relation Age of Onset  ? Macular degeneration Mother   ? COPD Mother   ? Non-Hodgkin's lymphoma Mother   ? Lung cancer Mother   ?     Per [facility] New Patient Packet   ? Hypertension Father   ? Suicidality Father 31  ?     Per [facility] New Patient Packet   ? Depression Father   ? Angina Father   ?     Per records from [place]   ? Diverticulosis Father   ?     Per records from [place]   ? Heart disease Father   ? High Cholesterol Sister   ? Macular degeneration Maternal Grandmother   ? Schizophrenia Son   ? Bipolar disorder Son   ? Autism Son   ? Post-traumatic stress disorder Son   ? Diabetes type II Daughter   ? Breast cancer Neg Hx   ? Colon cancer Neg Hx   ? Esophageal cancer Neg Hx   ? Stomach cancer Neg Hx   ? Liver disease Neg Hx   ?  Pancreatic cancer Neg Hx   ? ?Social History  ? ?Socioeconomic History  ? Marital status: Married  ?  Spouse name: Katharine Look  ? Number of children: 7  ? Years of education: 96  ? Highest education level: Not on file  ?Occupational History  ? Occupation: Retired  ?Tobacco Use  ?  Smoking status: Former  ?  Years: 12.00  ?  Types: Cigarettes  ?  Quit date: 02/15/1974  ?  Years since quitting: 86.2  ? Smokeless tobacco: Never  ?Vaping Use  ? Vaping Use: Never used  ?Substance and Sexual Activity  ? Alcohol use: Yes  ?  Alcohol/week: 14.0 standard drinks  ?  Types: 14 Standard drinks or equivalent per week  ?  Comment: 14 drinks weekly in the evening   ? Drug use: No  ? Sexual activity: Not Currently  ?Other Topics Concern  ? Not on file  ?Social History Narrative  ? Lives with wife, Katharine Look  ? Caffeine use: Coffee daily  ? Caffeine free soda  ?   ? As of 03/28/2018:  ? Diet: N/A  ?   ? Caffeine: Yes  ?   ? Married, if yes what year: Yes, 1971  ?   ? Do you live in a house, apartment, assisted living, condo, trailer, ect: House, one stories, 2 persons  ?   ? Pets: 1 cat  ?   ? Current/Past profession: Dietitian, Contractor  ?   ? Exercise: Yes, cardio strength 5 days weekly   ?   ?   ? Living Will: No  ? DNR: No, would like to discuss  ? POA/HPOA: Yes  ?   ? Functional Status:  ? Do you have difficulty bathing or dressing yourself? No  ? Do you have difficulty preparing food or eating? No  ? Do you have difficulty managing your medications? No  ? Do you have difficulty managing your finances? No  ? Do you have difficulty affording your medications? No  ? ?Social Determinants of Health  ? ?Financial Resource Strain: Not on file  ?Food Insecurity: Not on file  ?Transportation Needs: Not on file  ?Physical Activity: Not on file  ?Stress: Not on file  ?Social Connections: Not on file  ? ? ?Tobacco Counseling ?Counseling given: Not Answered ? ? ?Clinical Intake: ? ?Pre-visit preparation completed: Yes ? ?Pain : No/denies  pain ? ?  ? ?BMI - recorded: 31 ?Nutritional Risks: None ?Diabetes: No ? ?How often do you need to have someone help you when you read instructions, pamphlets, or other written materials from your doctor or pharmacy?

## 2021-05-05 NOTE — Progress Notes (Signed)
This service is provided via telemedicine ? ?No vital signs collected/recorded due to the encounter was a telemedicine visit.  ? ?Location of patient (ex: home, work):  Home ? ?Patient consents to a telephone visit:  Yes, see encounter dated 05/05/2021 ? ?Location of the provider (ex: office, home):  [place] ? ?Name of any referring provider:  N/A ? ?Names of all persons participating in the telemedicine service and their role in the encounter:  Sherrie Mustache, Nurse Practitioner, Carroll Kinds, CMA, and patient.  ? ?Time spent on call:  9 minutes with medical assistant ? ?

## 2021-05-05 NOTE — Patient Instructions (Signed)
Tim Walters , ?Thank you for taking time to come for your Medicare Wellness Visit. I appreciate your ongoing commitment to your health goals. Please review the following plan we discussed and let me know if I can assist you in the future.  ? ?Screening recommendations/referrals: ?Colonoscopy up to date ?Recommended yearly ophthalmology/optometry visit for glaucoma screening and checkup ?Recommended yearly dental visit for hygiene and checkup ? ?Vaccinations: ?Influenza vaccine up to date ?Pneumococcal vaccine up to date ?Tdap vaccine up to date ?Shingles vaccine up to date   ? ?Advanced directives: on file.  ? ?Conditions/risks identified: advanced age ? ?Next appointment: yearly for awv ? ?Preventive Care 78 Years and Older, Male ?Preventive care refers to lifestyle choices and visits with your health care provider that can promote health and wellness. ?What does preventive care include? ?A yearly physical exam. This is also called an annual well check. ?Dental exams once or twice a year. ?Routine eye exams. Ask your health care provider how often you should have your eyes checked. ?Personal lifestyle choices, including: ?Daily care of your teeth and gums. ?Regular physical activity. ?Eating a healthy diet. ?Avoiding tobacco and drug use. ?Limiting alcohol use. ?Practicing safe sex. ?Taking low doses of aspirin every day. ?Taking vitamin and mineral supplements as recommended by your health care provider. ?What happens during an annual well check? ?The services and screenings done by your health care provider during your annual well check will depend on your age, overall health, lifestyle risk factors, and family history of disease. ?Counseling  ?Your health care provider may ask you questions about your: ?Alcohol use. ?Tobacco use. ?Drug use. ?Emotional well-being. ?Home and relationship well-being. ?Sexual activity. ?Eating habits. ?History of falls. ?Memory and ability to understand (cognition). ?Work and work  Statistician. ?Screening  ?You may have the following tests or measurements: ?Height, weight, and BMI. ?Blood pressure. ?Lipid and cholesterol levels. These may be checked every 5 years, or more frequently if you are over 78 years old. ?Skin check. ?Lung cancer screening. You may have this screening every year starting at age 34 if you have a 30-pack-year history of smoking and currently smoke or have quit within the past 15 years. ?Fecal occult blood test (FOBT) of the stool. You may have this test every year starting at age 40. ?Flexible sigmoidoscopy or colonoscopy. You may have a sigmoidoscopy every 5 years or a colonoscopy every 10 years starting at age 78. ?Prostate cancer screening. Recommendations will vary depending on your family history and other risks. ?Hepatitis C blood test. ?Hepatitis B blood test. ?Sexually transmitted disease (STD) testing. ?Diabetes screening. This is done by checking your blood sugar (glucose) after you have not eaten for a while (fasting). You may have this done every 1-3 years. ?Abdominal aortic aneurysm (AAA) screening. You may need this if you are a current or former smoker. ?Osteoporosis. You may be screened starting at age 78 if you are at high risk. ?Talk with your health care provider about your test results, treatment options, and if necessary, the need for more tests. ?Vaccines  ?Your health care provider may recommend certain vaccines, such as: ?Influenza vaccine. This is recommended every year. ?Tetanus, diphtheria, and acellular pertussis (Tdap, Td) vaccine. You may need a Td booster every 10 years. ?Zoster vaccine. You may need this after age 78. ?Pneumococcal 13-valent conjugate (PCV13) vaccine. One dose is recommended after age 26. ?Pneumococcal polysaccharide (PPSV23) vaccine. One dose is recommended after age 78. ?Talk to your health care provider about which screenings  and vaccines you need and how often you need them. ?This information is not intended to replace  advice given to you by your health care provider. Make sure you discuss any questions you have with your health care provider. ?Document Released: 02/28/2015 Document Revised: 10/22/2015 Document Reviewed: 12/03/2014 ?Elsevier Interactive Patient Education ? 2017 [place]. ? ?Fall Prevention in the Home ?Falls can cause injuries. They can happen to people of all ages. There are many things you can do to make your home safe and to help prevent falls. ?What can I do on the outside of my home? ?Regularly fix the edges of walkways and driveways and fix any cracks. ?Remove anything that might make you trip as you walk through a door, such as a raised step or threshold. ?Trim any bushes or trees on the path to your home. ?Use bright outdoor lighting. ?Clear any walking paths of anything that might make someone trip, such as rocks or tools. ?Regularly check to see if handrails are loose or broken. Make sure that both sides of any steps have handrails. ?Any raised decks and porches should have guardrails on the edges. ?Have any leaves, snow, or ice cleared regularly. ?Use sand or salt on walking paths during winter. ?Clean up any spills in your garage right away. This includes oil or grease spills. ?What can I do in the bathroom? ?Use night lights. ?Install grab bars by the toilet and in the tub and shower. Do not use towel bars as grab bars. ?Use non-skid mats or decals in the tub or shower. ?If you need to sit down in the shower, use a plastic, non-slip stool. ?Keep the floor dry. Clean up any water that spills on the floor as soon as it happens. ?Remove soap buildup in the tub or shower regularly. ?Attach bath mats securely with double-sided non-slip rug tape. ?Do not have throw rugs and other things on the floor that can make you trip. ?What can I do in the bedroom? ?Use night lights. ?Make sure that you have a light by your bed that is easy to reach. ?Do not use any sheets or blankets that are too big for your bed.  They should not hang down onto the floor. ?Have a firm chair that has side arms. You can use this for support while you get dressed. ?Do not have throw rugs and other things on the floor that can make you trip. ?What can I do in the kitchen? ?Clean up any spills right away. ?Avoid walking on wet floors. ?Keep items that you use a lot in easy-to-reach places. ?If you need to reach something above you, use a strong step stool that has a grab bar. ?Keep electrical cords out of the way. ?Do not use floor polish or wax that makes floors slippery. If you must use wax, use non-skid floor wax. ?Do not have throw rugs and other things on the floor that can make you trip. ?What can I do with my stairs? ?Do not leave any items on the stairs. ?Make sure that there are handrails on both sides of the stairs and use them. Fix handrails that are broken or loose. Make sure that handrails are as long as the stairways. ?Check any carpeting to make sure that it is firmly attached to the stairs. Fix any carpet that is loose or worn. ?Avoid having throw rugs at the top or bottom of the stairs. If you do have throw rugs, attach them to the floor with carpet tape. ?  Make sure that you have a light switch at the top of the stairs and the bottom of the stairs. If you do not have them, ask someone to add them for you. ?What else can I do to help prevent falls? ?Wear shoes that: ?Do not have high heels. ?Have rubber bottoms. ?Are comfortable and fit you well. ?Are closed at the toe. Do not wear sandals. ?If you use a stepladder: ?Make sure that it is fully opened. Do not climb a closed stepladder. ?Make sure that both sides of the stepladder are locked into place. ?Ask someone to hold it for you, if possible. ?Clearly mark and make sure that you can see: ?Any grab bars or handrails. ?First and last steps. ?Where the edge of each step is. ?Use tools that help you move around (mobility aids) if they are needed. These  include: ?Canes. ?Walkers. ?Scooters. ?Crutches. ?Turn on the lights when you go into a dark area. Replace any light bulbs as soon as they burn out. ?Set up your furniture so you have a clear path. Avoid moving your furniture aro

## 2021-05-07 ENCOUNTER — Other Ambulatory Visit: Payer: Medicare Other

## 2021-05-07 ENCOUNTER — Other Ambulatory Visit: Payer: Self-pay

## 2021-05-07 DIAGNOSIS — I25118 Atherosclerotic heart disease of native coronary artery with other forms of angina pectoris: Secondary | ICD-10-CM | POA: Diagnosis not present

## 2021-05-07 DIAGNOSIS — I700 Atherosclerosis of aorta: Secondary | ICD-10-CM

## 2021-05-07 DIAGNOSIS — I10 Essential (primary) hypertension: Secondary | ICD-10-CM | POA: Diagnosis not present

## 2021-05-07 DIAGNOSIS — R739 Hyperglycemia, unspecified: Secondary | ICD-10-CM

## 2021-05-07 DIAGNOSIS — E785 Hyperlipidemia, unspecified: Secondary | ICD-10-CM

## 2021-05-08 LAB — CBC WITH DIFFERENTIAL/PLATELET
Absolute Monocytes: 672 cells/uL (ref 200–950)
Basophils Absolute: 30 cells/uL (ref 0–200)
Basophils Relative: 0.5 %
Eosinophils Absolute: 132 cells/uL (ref 15–500)
Eosinophils Relative: 2.2 %
HCT: 43.6 % (ref 38.5–50.0)
Hemoglobin: 14.3 g/dL (ref 13.2–17.1)
Lymphs Abs: 1146 cells/uL (ref 850–3900)
MCH: 31.7 pg (ref 27.0–33.0)
MCHC: 32.8 g/dL (ref 32.0–36.0)
MCV: 96.7 fL (ref 80.0–100.0)
MPV: 10.2 fL (ref 7.5–12.5)
Monocytes Relative: 11.2 %
Neutro Abs: 4020 cells/uL (ref 1500–7800)
Neutrophils Relative %: 67 %
Platelets: 180 10*3/uL (ref 140–400)
RBC: 4.51 10*6/uL (ref 4.20–5.80)
RDW: 12.7 % (ref 11.0–15.0)
Total Lymphocyte: 19.1 %
WBC: 6 10*3/uL (ref 3.8–10.8)

## 2021-05-08 LAB — COMPLETE METABOLIC PANEL WITH GFR
AG Ratio: 1.6 (calc) (ref 1.0–2.5)
ALT: 21 U/L (ref 9–46)
AST: 22 U/L (ref 10–35)
Albumin: 3.9 g/dL (ref 3.6–5.1)
Alkaline phosphatase (APISO): 64 U/L (ref 35–144)
BUN: 21 mg/dL (ref 7–25)
CO2: 29 mmol/L (ref 20–32)
Calcium: 9.1 mg/dL (ref 8.6–10.3)
Chloride: 105 mmol/L (ref 98–110)
Creat: 0.95 mg/dL (ref 0.70–1.28)
Globulin: 2.5 g/dL (calc) (ref 1.9–3.7)
Glucose, Bld: 122 mg/dL — ABNORMAL HIGH (ref 65–99)
Potassium: 4.5 mmol/L (ref 3.5–5.3)
Sodium: 140 mmol/L (ref 135–146)
Total Bilirubin: 0.7 mg/dL (ref 0.2–1.2)
Total Protein: 6.4 g/dL (ref 6.1–8.1)
eGFR: 82 mL/min/{1.73_m2} (ref >=60)

## 2021-05-08 LAB — LIPID PANEL
Cholesterol: 140 mg/dL (ref <200)
HDL: 60 mg/dL (ref >=40)
LDL Cholesterol (Calc): 67 mg/dL (calc)
Non-HDL Cholesterol (Calc): 80 mg/dL (calc) (ref <130)
Total CHOL/HDL Ratio: 2.3 (calc) (ref <5.0)
Triglycerides: 58 mg/dL (ref <150)

## 2021-05-08 LAB — HEMOGLOBIN A1C
Hgb A1c MFr Bld: 5.8 % of total Hgb — ABNORMAL HIGH (ref <5.7)
Mean Plasma Glucose: 120 mg/dL
eAG (mmol/L): 6.6 mmol/L

## 2021-05-11 ENCOUNTER — Ambulatory Visit: Payer: Medicare Other | Admitting: Nurse Practitioner

## 2021-05-11 ENCOUNTER — Ambulatory Visit (INDEPENDENT_AMBULATORY_CARE_PROVIDER_SITE_OTHER): Payer: Medicare Other | Admitting: Nurse Practitioner

## 2021-05-11 ENCOUNTER — Other Ambulatory Visit: Payer: Self-pay

## 2021-05-11 ENCOUNTER — Encounter: Payer: Self-pay | Admitting: Nurse Practitioner

## 2021-05-11 VITALS — BP 138/64 | HR 64 | Temp 97.5°F | Ht 67.0 in | Wt 203.0 lb

## 2021-05-11 DIAGNOSIS — I25118 Atherosclerotic heart disease of native coronary artery with other forms of angina pectoris: Secondary | ICD-10-CM | POA: Diagnosis not present

## 2021-05-11 DIAGNOSIS — R739 Hyperglycemia, unspecified: Secondary | ICD-10-CM | POA: Diagnosis not present

## 2021-05-11 DIAGNOSIS — J309 Allergic rhinitis, unspecified: Secondary | ICD-10-CM

## 2021-05-11 DIAGNOSIS — G4733 Obstructive sleep apnea (adult) (pediatric): Secondary | ICD-10-CM

## 2021-05-11 DIAGNOSIS — E785 Hyperlipidemia, unspecified: Secondary | ICD-10-CM

## 2021-05-11 DIAGNOSIS — I10 Essential (primary) hypertension: Secondary | ICD-10-CM | POA: Diagnosis not present

## 2021-05-11 DIAGNOSIS — I700 Atherosclerosis of aorta: Secondary | ICD-10-CM

## 2021-05-11 DIAGNOSIS — F325 Major depressive disorder, single episode, in full remission: Secondary | ICD-10-CM

## 2021-05-11 NOTE — Progress Notes (Signed)
? ? ?Careteam: ?Patient Care Team: ?Lauree Chandler, NP as PCP - General (Geriatric Medicine) ?Fanny Skates, MD as Consulting Physician (General Surgery) ?Marchia Bond, MD as Consulting Physician (Orthopedic Surgery) ?Jarome Matin, MD as Consulting Physician (Dermatology) ?Belva Crome, MD as Consulting Physician (Cardiology) ?Kathrynn Ducking, MD (Inactive) as Consulting Physician (Neurology) ?Fonnie Mu, [place] (Optometry) ? ?PLACE OF SERVICE:  ?[facility]  ?Advanced Directive information ?Does Patient Have a Medical Advance Directive?: Yes, Type of Advance Directive: Out of facility DNR (pink MOST or yellow form), Pre-existing out of facility DNR order (yellow form or pink MOST form): Pink MOST form placed in chart (order not valid for inpatient use), Does patient want to make changes to medical advance directive?: No - Patient declined ? ?No Known Allergies ? ?Chief Complaint  ?Patient presents with  ? Medical Management of Chronic Issues  ?  6 month follow-up and discuss labs (copy printed). Ongoing cough, drainage, and low energy since covid in January 2023  ? ? ? ?HPI: Patient is a 76 y.o. male for routine follow up.  ? ?Reports he walks 3 miles and works out for 45 mins, yesterday he walked 4 miles.  ? ?Continues to have post nasal drip since COVID. Getting better since but slow. ?Will have drainage then cough and some wheezing but then will go away as the day goes by.  ? ?OSA- could not tolerates cpap- wife reports he does not snore or stop breathing like he used to.  ? ?HTN- on ramipril, coreg, htz ? ?Depression- controlled on zoloft.  ? ?Review of Systems:  ?Review of Systems  ?Constitutional:  Negative for chills, fever and weight loss.  ?HENT:  Negative for tinnitus.   ?Respiratory:  Negative for cough, sputum production and shortness of breath.   ?Cardiovascular:  Negative for chest pain, palpitations and leg swelling.  ?Gastrointestinal:  Negative for abdominal pain, constipation, diarrhea  and heartburn.  ?Genitourinary:  Negative for dysuria, frequency and urgency.  ?Musculoskeletal:  Positive for joint pain. Negative for back pain, falls and myalgias.  ?Skin: Negative.   ?Neurological:  Negative for dizziness and headaches.  ?Psychiatric/Behavioral:  Negative for depression and memory loss. The patient does not have insomnia.   ? ?Past Medical History:  ?Diagnosis Date  ? Arthritis   ? lt ankle  ? Breast lump   ? Per records from [place]   ? Colon polyp   ? Coronary artery disease   ? a. BMS to RCA 2003 with residual LAD/diag disease treated medically, normal EF.  ? COVID   ? Depression   ? Diverticulosis   ? Per records from [place]   ? GERD (gastroesophageal reflux disease)   ? Hyperkalemia   ? a. K of 5.2 in 2017.  ? Hyperlipidemia   ? Hypertension   ? Idiopathic peripheral neuropathy   ? Per records from [place]   ? Impaired fasting glucose   ? Per records from [place]   ? Kidney stones   ? Monoclonal gammopathy of undetermined significance   ? Per records from [place]   ? Myocardial infarction ([place]) 11/15/2001  ? Dr. Daneen Schick [facility])  ? OSA (obstructive sleep apnea)   ? Per [facility] New Patient Packet  ? Peripheral neuropathy   ? Left Foot, Per [place] New Patient Packet  ? Pre-diabetes   ? Sleep apnea   ? had test several yr ago-said he did not need a cpap-still snores  ? Torn rotator  cuff   ? Per [facility] New Patient Packet  ? Venous insufficiency of left leg   ? Per [facility] New Patient Packet  ? Wears glasses   ? ?Past Surgical History:  ?Procedure Laterality Date  ? ANGIOPLASTY  2003  ? Per records from [place]   ? CARDIAC CATHETERIZATION  23003  ? stent rca  ? CARDIOVASCULAR STRESS TEST  02/16/2016  ? Per records from [place]   ? COLONOSCOPY  06/17/2003  ? Per records from [facility], Dr.Ganem to be repeated 2015  ? coronary artery stent  11/15/2001  ? CORONARY STENT PLACEMENT  11/15/2001  ? Per records from [place]    ? CYST REMOVAL TRUNK Left 05/08/2013  ? Procedure: CYST REMOVAL BACK;  Surgeon: Adin Hector, MD;  Location: [place];  Service: General;  Laterality: Left;  ? INGUINAL HERNIA REPAIR Left 03/24/2015  ? Procedure: OPEN REPAIR LEFT INGUINAL HERNIA ;  Surgeon: Fanny Skates, MD;  Location: [place];  Service: General;  Laterality: Left;  ? INSERTION OF MESH Left 03/24/2015  ? Procedure: INSERTION OF MESH;  Surgeon: Fanny Skates, MD;  Location: [place];  Service: General;  Laterality: Left;  ? SHOULDER ARTHROSCOPY W/ ROTATOR CUFF REPAIR  2011  ? right  ? SPINE SURGERY  02/12/1993  ? L2, L3 fragmented disc  ? VASECTOMY  1987  ? VEIN REPAIR  09/2018  ? Vein injections   ? ?Social History: ?  reports that he quit smoking about 47 years ago. His smoking use included cigarettes. He has never used smokeless tobacco. He reports current alcohol use of about 14.0 standard drinks per week. He reports that he does not use drugs. ? ?Family History  ?Problem Relation Age of Onset  ? Macular degeneration Mother   ? COPD Mother   ? Non-Hodgkin's lymphoma Mother   ? Lung cancer Mother   ?     Per [facility] New Patient Packet   ? Hypertension Father   ? Suicidality Father 57  ?     Per [facility] New Patient Packet   ? Depression Father   ? Angina Father   ?     Per records from [place]   ? Diverticulosis Father   ?     Per records from [place]   ? Heart disease Father   ? High Cholesterol Sister   ? Macular degeneration Maternal Grandmother   ? Schizophrenia Son   ? Bipolar disorder Son   ? Autism Son   ? Post-traumatic stress disorder Son   ? Diabetes type II Daughter   ? Breast cancer Neg Hx   ? Colon cancer Neg Hx   ? Esophageal cancer Neg Hx   ? Stomach cancer Neg Hx   ? Liver disease Neg Hx   ? Pancreatic cancer Neg Hx   ? ? ?Medications: ?Patient's Medications  ?New Prescriptions  ? No medications on file  ?Previous Medications  ? ASPIRIN 81 MG TABLET    Take 1 tablet  (81 mg total) by mouth daily.  ? ATORVASTATIN (LIPITOR) 80 MG TABLET    TAKE 1 TABLET BY MOUTH  DAILY  ? CARVEDILOL (COREG) 12.5 MG TABLET    TAKE 1 TABLET BY MOUTH  TWICE DAILY  ? HYDROCHLOROTHIAZIDE (MICROZIDE) 12.5 MG CAPSULE    TAKE 1 CAPSULE BY MOUTH  DAILY  ? MULTIPLE VITAMIN (MULTIVITAMIN WITH MINERALS) TABS TABLET    Take 1 tablet by mouth daily.  ?  OMEPRAZOLE PO    Take 20 mg by mouth every morning.   ? RAMIPRIL (ALTACE) 10 MG CAPSULE    Take 1 capsule (10 mg total) by mouth daily.  ? SERTRALINE (ZOLOFT) 50 MG TABLET    TAKE 1 TABLET BY MOUTH  DAILY  ?Modified Medications  ? No medications on file  ?Discontinued Medications  ? No medications on file  ? ? ?Physical Exam: ? ?Vitals:  ? 05/11/21 1445  ?BP: (!) 144/80  ?Pulse: 64  ?Temp: (!) 97.5 ?F (36.4 ?C)  ?TempSrc: Temporal  ?SpO2: 96%  ?Weight: 203 lb (92.1 kg)  ?Height: '5\' 7"'$  (1.702 m)  ? ?Body mass index is 31.79 kg/m?. ?Wt Readings from Last 3 Encounters:  ?05/11/21 203 lb (92.1 kg)  ?11/10/20 195 lb (88.5 kg)  ?09/15/20 189 lb (85.7 kg)  ? ? ?Physical Exam ?Constitutional:   ?   General: He is not in acute distress. ?   Appearance: He is well-developed. He is not diaphoretic.  ?HENT:  ?   Head: Normocephalic and atraumatic.  ?   Right Ear: External ear normal.  ?   Left Ear: External ear normal.  ?   Mouth/Throat:  ?   Pharynx: No oropharyngeal exudate.  ?Eyes:  ?   Conjunctiva/sclera: Conjunctivae normal.  ?   Pupils: Pupils are equal, round, and reactive to light.  ?Cardiovascular:  ?   Rate and Rhythm: Normal rate and regular rhythm.  ?   Heart sounds: Normal heart sounds.  ?Pulmonary:  ?   Effort: Pulmonary effort is normal.  ?   Breath sounds: Normal breath sounds.  ?Abdominal:  ?   General: Bowel sounds are normal.  ?   Palpations: Abdomen is soft.  ?Musculoskeletal:     ?   General: No tenderness.  ?   Cervical back: Normal range of motion and neck supple.  ?   Right lower leg: No edema.  ?   Left lower leg: No edema.  ?Skin: ?   General: Skin  is warm and dry.  ?Neurological:  ?   Mental Status: He is alert and oriented to person, place, and time.  ? ? ?Labs reviewed: ?Basic Metabolic Panel: ?Recent Labs  ?  10/02/20 ?0301 11/06/20 ?1884 03/

## 2021-05-11 NOTE — Patient Instructions (Signed)
Zyrtec or Claritin (can get generic) 10 mg by mouth daily for 2 weeks to see if improves symptoms ?

## 2021-05-12 ENCOUNTER — Ambulatory Visit: Payer: Medicare Other | Admitting: Nurse Practitioner

## 2021-05-18 DIAGNOSIS — R208 Other disturbances of skin sensation: Secondary | ICD-10-CM | POA: Diagnosis not present

## 2021-05-18 DIAGNOSIS — L538 Other specified erythematous conditions: Secondary | ICD-10-CM | POA: Diagnosis not present

## 2021-05-18 DIAGNOSIS — Z789 Other specified health status: Secondary | ICD-10-CM | POA: Diagnosis not present

## 2021-05-18 DIAGNOSIS — L820 Inflamed seborrheic keratosis: Secondary | ICD-10-CM | POA: Diagnosis not present

## 2021-05-18 DIAGNOSIS — L298 Other pruritus: Secondary | ICD-10-CM | POA: Diagnosis not present

## 2021-06-25 ENCOUNTER — Other Ambulatory Visit: Payer: Self-pay | Admitting: Nurse Practitioner

## 2021-06-25 DIAGNOSIS — I10 Essential (primary) hypertension: Secondary | ICD-10-CM

## 2021-06-25 DIAGNOSIS — R739 Hyperglycemia, unspecified: Secondary | ICD-10-CM

## 2021-06-25 DIAGNOSIS — I700 Atherosclerosis of aorta: Secondary | ICD-10-CM

## 2021-08-01 ENCOUNTER — Other Ambulatory Visit: Payer: Self-pay | Admitting: Nurse Practitioner

## 2021-08-01 ENCOUNTER — Other Ambulatory Visit: Payer: Self-pay | Admitting: Interventional Cardiology

## 2021-08-03 ENCOUNTER — Other Ambulatory Visit: Payer: Self-pay | Admitting: Interventional Cardiology

## 2021-08-24 ENCOUNTER — Other Ambulatory Visit: Payer: Self-pay

## 2021-08-24 MED ORDER — CARVEDILOL 12.5 MG PO TABS: 12.5000 mg | ORAL_TABLET | Freq: Two times a day (BID) | ORAL | 0 refills | Status: DC

## 2021-08-24 MED ORDER — RAMIPRIL 10 MG PO CAPS: 10.0000 mg | ORAL_CAPSULE | Freq: Every day | ORAL | 0 refills | Status: DC

## 2021-09-02 ENCOUNTER — Ambulatory Visit
Admission: RE | Admit: 2021-09-02 | Discharge: 2021-09-02 | Disposition: A | Discharge status: Home / Self Care | Payer: Medicare Other | Source: Ambulatory Visit | Attending: Family | Admitting: Family

## 2021-09-02 ENCOUNTER — Ambulatory Visit (INDEPENDENT_AMBULATORY_CARE_PROVIDER_SITE_OTHER): Payer: Medicare Other | Admitting: Family

## 2021-09-02 ENCOUNTER — Encounter: Payer: Self-pay | Admitting: Nurse Practitioner

## 2021-09-02 ENCOUNTER — Encounter: Payer: Self-pay | Admitting: Family

## 2021-09-02 VITALS — BP 110/62 | HR 60 | Temp 97.6°F | Resp 16 | Ht 67.0 in | Wt 207.0 lb

## 2021-09-02 DIAGNOSIS — Z87442 Personal history of urinary calculi: Secondary | ICD-10-CM | POA: Diagnosis not present

## 2021-09-02 DIAGNOSIS — R109 Unspecified abdominal pain: Secondary | ICD-10-CM

## 2021-09-02 DIAGNOSIS — M545 Low back pain, unspecified: Secondary | ICD-10-CM | POA: Diagnosis not present

## 2021-09-02 LAB — POCT URINALYSIS DIPSTICK
Bilirubin, UA: NEGATIVE
Glucose, UA: NEGATIVE
Ketones, UA: POSITIVE
Nitrite, UA: NEGATIVE
Protein, UA: NEGATIVE
Spec Grav, UA: 1.015 (ref 1.010–1.025)
Urobilinogen, UA: NEGATIVE E.U./dL — AB
pH, UA: 5 (ref 5.0–8.0)

## 2021-09-02 NOTE — Progress Notes (Signed)
Provider: Khalon Cansler FNP-C  Lauree Chandler, NP  Patient Care Team: Lauree Chandler, NP as PCP - General (Geriatric Medicine) Fanny Skates, MD as Consulting Physician (General Surgery) Marchia Bond, MD as Consulting Physician (Orthopedic Surgery) Jarome Matin, MD as Consulting Physician (Dermatology) Belva Crome, MD as Consulting Physician (Cardiology) Kathrynn Ducking, MD (Inactive) as Consulting Physician (Neurology) Fonnie Mu, OD [facility])  Extended Emergency Contact Information Primary Emergency Contact: Darnelle Spangle Address: [address]          [place],  [address] [place]te Litter of [place] Phone: [phone] Mobile Phone: [phone] Relation: Spouse  Code Status:  DNR Goals of care: Advanced Directive information    09/02/2021    1:15 PM  Advanced Directives  Does Patient Have a Medical Advance Directive? Yes  Type of Paramedic of [place];Living will;Out of facility DNR (pink MOST or yellow form)  Does patient want to make changes to medical advance directive? No - Patient declined  Copy of [place] in Chart? No - copy requested     Chief Complaint  Patient presents with   Acute Visit    Patient complains of possible kidney stones. Symptoms are right lower back pain that moves to right lower groin area. Symptoms started 10 days ago.    HPI:  Pt is a 76 y.o. male seen today for an acute visit for evaluation of right lower back pain x 10 days.Pain radiates to right groin area.Had kidney stone about 8 weeks ago that he was able to pass it out.He denies any fever,chills,hematuria,nausea or vomiting. Has not changed his diet. Drinks 50 -60 oz of water. denies any fever,chills,nausea,vomiting,urgency,frequency,dysuria,difficult urination or hematuria.  Past Medical History:  Diagnosis Date   Arthritis    lt ankle   Breast lump    Per records from [place]    Colon  polyp    Coronary artery disease    a. BMS to RCA 2003 with residual LAD/diag disease treated medically, normal EF.   COVID    Depression    Diverticulosis    Per records from [place]    GERD (gastroesophageal reflux disease)    Hyperkalemia    a. K of 5.2 in 2017.   Hyperlipidemia    Hypertension    Idiopathic peripheral neuropathy    Per records from [facility]    Impaired fasting glucose    Per records from [place]    Kidney stones    Monoclonal gammopathy of undetermined significance    Per records from [place]    Myocardial infarction [facility]) 11/15/2001   Dr. Daneen Schick [facility])   OSA (obstructive sleep apnea)    Per [facility] New Patient Packet   Peripheral neuropathy    Left Foot, Per [place] New Patient Packet   Pre-diabetes    Sleep apnea    had test several yr ago-said he did not need a cpap-still snores   Torn rotator cuff    Per [facility] New Patient Packet   Venous insufficiency of left leg    Per [facility] New Patient Packet   Wears glasses    Past Surgical History:  Procedure Laterality Date   ANGIOPLASTY  2003   Per records from [place]   stent rca   CARDIOVASCULAR STRESS TEST  02/16/2016   Per records from [facility]  06/17/2003   Per records from [place],  Dr.Ganem to be repeated 2015   coronary artery stent  11/15/2001   CORONARY STENT PLACEMENT  11/15/2001   Per records from [place] Left 05/08/2013   Procedure: CYST REMOVAL BACK;  Surgeon: Adin Hector, MD;  Location: [place];  Service: General;  Laterality: Left;   INGUINAL HERNIA REPAIR Left 03/24/2015   Procedure: OPEN REPAIR LEFT INGUINAL HERNIA ;  Surgeon: Fanny Skates, MD;  Location: [place];  Service: General;  Laterality: Left;   INSERTION OF MESH Left 03/24/2015   Procedure: INSERTION OF MESH;  Surgeon: Fanny Skates, MD;   Location: [place];  Service: General;  Laterality: Left;   SHOULDER ARTHROSCOPY W/ ROTATOR CUFF REPAIR  2011   right   SPINE SURGERY  02/12/1993   L2, L3 fragmented disc   [place]  09/2018   Vein injections     No Known Allergies  Outpatient Encounter Medications as of 09/02/2021  Medication Sig   aspirin 81 MG tablet Take 1 tablet (81 mg total) by mouth daily.   atorvastatin (LIPITOR) 80 MG tablet TAKE 1 TABLET BY MOUTH  DAILY   carvedilol (COREG) 12.5 MG tablet Take 1 tablet (12.5 mg total) by mouth 2 (two) times daily. Keep upcoming office visit for further refills. Thank you   hydrochlorothiazide (MICROZIDE) 12.5 MG capsule Take 1 capsule (12.5 mg total) by mouth daily. Please schedule appointment for future refills. Thank you   Multiple Vitamin (MULTIVITAMIN WITH MINERALS) TABS tablet Take 1 tablet by mouth daily.   OMEPRAZOLE PO Take 20 mg by mouth every morning.    ramipril (ALTACE) 10 MG capsule Take 1 capsule (10 mg total) by mouth daily. Keep upcoming appointment to receive further refills. Thanks.   sertraline (ZOLOFT) 50 MG tablet TAKE 1 TABLET BY MOUTH  DAILY   No facility-administered encounter medications on file as of 09/02/2021.    Review of Systems  Constitutional:  Negative for chills, fatigue and fever.  Gastrointestinal:  Negative for abdominal distention, abdominal pain, constipation, diarrhea, nausea and vomiting.  Genitourinary:  Negative for difficulty urinating, dysuria, flank pain, frequency, hematuria and urgency.  Musculoskeletal:  Positive for back pain.  Psychiatric/Behavioral:  Negative for agitation, behavioral problems, confusion and hallucinations.     Immunization History  Administered Date(s) Administered   Fluad Quad(high Dose 65+) 11/06/2018, 11/10/2020   Influenza, High Dose Seasonal PF 10/30/2019   Influenza, Seasonal, Injecte, Preservative Fre 02/15/2012   Influenza-Unspecified 12/17/2016,  01/15/2018   PFIZER(Purple Top)SARS-COV-2 Vaccination 02/24/2019, 03/17/2019, 10/30/2019, 05/21/2020   Pneumococcal Conjugate-13 02/19/2015   Pneumococcal Polysaccharide-23 09/17/2009   Td 03/31/2001   Tdap 10/13/2010, 04/10/2018   Zoster Recombinat (Shingrix) 02/03/2017, 04/14/2017   Zoster, Live 05/25/2012   Pertinent  Health Maintenance Due  Topic Date Due   INFLUENZA VACCINE  09/15/2021   COLONOSCOPY (Pts 45-47yr Insurance coverage will need to be confirmed)  06/13/2022      04/30/2020   10:00 AM 10/02/2020    3:00 AM 05/05/2021    2:51 PM 05/11/2021    2:44 PM 09/02/2021    1:15 PM  Fall Risk  Falls in the past year? 0  0 0 0  Was there an injury with Fall? 0  0 0 0  Fall Risk Category Calculator 0  0 0 0  Fall Risk Category Low  Low Low Low  Patient Fall Risk Level Low fall risk Low fall risk Low fall  risk Low fall risk Low fall risk  Patient at Risk for Falls Due to   No Fall Risks No Fall Risks No Fall Risks  Fall risk Follow up   Falls evaluation completed Falls evaluation completed Falls evaluation completed   Functional Status Survey:    Vitals:   09/02/21 1310  BP: 110/62  Pulse: 60  Resp: 16  Temp: 97.6 F (36.4 C)  SpO2: 97%  Weight: 207 lb (93.9 kg)  Height: '5\' 7"'$  (1.702 m)   Body mass index is 32.42 kg/m. Physical Exam Vitals reviewed.  Constitutional:      General: He is not in acute distress.    Appearance: Normal appearance. He is normal weight. He is not ill-appearing or diaphoretic.  HENT:     Head: Normocephalic.     Mouth/Throat:     Mouth: Mucous membranes are moist.     Pharynx: No oropharyngeal exudate or posterior oropharyngeal erythema.  Eyes:     General: No scleral icterus.       Right eye: No discharge.        Left eye: No discharge.     Conjunctiva/sclera: Conjunctivae normal.     Pupils: Pupils are equal, round, and reactive to light.  Neck:     Vascular: No carotid bruit.  Cardiovascular:     Rate and Rhythm: Normal rate  and regular rhythm.     Pulses: Normal pulses.     Heart sounds: Normal heart sounds. No murmur heard.    No friction rub. No gallop.  Pulmonary:     Effort: Pulmonary effort is normal. No respiratory distress.     Breath sounds: Normal breath sounds. No wheezing, rhonchi or rales.  Chest:     Chest wall: No tenderness.  Abdominal:     General: Bowel sounds are normal. There is no distension.     Palpations: Abdomen is soft. There is no mass.     Tenderness: There is abdominal tenderness in the right lower quadrant. There is no right CVA tenderness, left CVA tenderness, guarding or rebound. Negative signs include Murphy's sign.  Musculoskeletal:        General: No swelling or tenderness. Normal range of motion.     Cervical back: Normal range of motion. No rigidity or tenderness.     Right lower leg: No edema.     Left lower leg: No edema.  Lymphadenopathy:     Cervical: No cervical adenopathy.  Skin:    General: Skin is warm and dry.     Coloration: Skin is not pale.     Findings: No bruising, erythema or rash.  Neurological:     Mental Status: He is alert and oriented to person, place, and time.     Motor: No weakness.     Gait: Gait normal.  Psychiatric:        Mood and Affect: Mood normal.        Speech: Speech normal.        Behavior: Behavior normal.     Labs reviewed: Recent Labs    10/02/20 0301 11/06/20 0831 05/07/21 0830  NA 136 141 140  K 4.4 4.2 4.5  CL 104 108 105  CO2 '25 25 29  '$ GLUCOSE 115* 132* 122*  BUN 31* 30* 21  CREATININE 1.02 0.98 0.95  CALCIUM 9.1 9.0 9.1   Recent Labs    10/02/20 0301 11/06/20 0831 05/07/21 0830  AST '25 22 22  '$ ALT '25 24 21  '$ ALKPHOS 56  --   --  BILITOT 0.9 0.6 0.7  PROT 6.4* 6.5 6.4  ALBUMIN 3.5  --   --    Recent Labs    10/02/20 0301 11/06/20 0831 05/07/21 0830  WBC 6.7 6.4 6.0  NEUTROABS 4.4 4,384 4,020  HGB 14.2 14.0 14.3  HCT 43.1 42.4 43.6  MCV 96.0 95.9 96.7  PLT 181 194 180   Lab Results   Component Value Date   TSH 2.73 03/21/2017   Lab Results  Component Value Date   HGBA1C 5.8 (H) 05/07/2021   Lab Results  Component Value Date   CHOL 140 05/07/2021   HDL 60 05/07/2021   LDLCALC 67 05/07/2021   TRIG 58 05/07/2021   CHOLHDL 2.3 05/07/2021    Significant Diagnostic Results in last 30 days:  No results found.  Assessment/Plan 1. Low back pain, unspecified back pain laterality, unspecified chronicity, unspecified whether sciatica present Afebrile No injury to back  Will rule out UTI  - POC Urinalysis Dipstick indicates a yellow slightly cloudy urine positive for ketones, moderate blood and moderate 2+ leukocytes but negative for nitrites.  We will send urine for culture. Encouraged to increase water intake to 6 to 8 glasses daily -Continue over-the-counter analgesic notify provider if pain worsen will go to the ED - Urine Culture  2. Acute right flank pain Tender to palpation on right lower quadrant and flank area.  Suspicious for kidney stones.  Will obtain renal ultrasound.  [employer] [facility] care staff called [facility] to obtain appointment and appointment has been given for today at 2:55 PM.  Time and location given to patient patient headed out to [facility] imaging after visit. We will put a referral to urologist due to frequent kidney stones - Ambulatory referral to Urology - US RENAL - Urine Culture  3. History of kidney stones Urine had given to collect urine and strain at home then notify provider for any kidney stone passed -Encouraged to increase fluid intake as above - Ambulatory referral to Urology  Family/ staff Communication: Reviewed plan of care with patient verbalized understanding  Labs/tests ordered:  - US RENAL - Urine Culture  Next Appointment: Return if symptoms worsen or fail to improve.   Sandrea Hughs, NP

## 2021-09-02 NOTE — Patient Instructions (Addendum)
Please get renal ultrasound at [place] at [facility] then will call you with results.  Kidney Stones  Kidney stones are solid, rock-like deposits that form inside of the kidneys. The kidneys are a pair of organs that make urine. A kidney stone may form in a kidney and move into other parts of the urinary tract, including the tubes that connect the kidneys to the bladder (ureters), the bladder, and the tube that carries urine out of the body (urethra). As the stone moves through these areas, it can cause intense pain and block the flow of urine. Kidney stones are created when high levels of certain minerals are found in the urine. The stones are usually passed out of the body through urination, but in some cases, medical treatment may be needed to remove them. What are the causes? Kidney stones may be caused by: A condition in which certain glands produce too much parathyroid hormone (primary hyperparathyroidism), which causes too much calcium buildup in the blood. A buildup of uric acid crystals in the bladder (hyperuricosuria). Uric acid is a chemical that the body produces when you eat certain foods. It usually exits the body in the urine. Narrowing (stricture) of one or both of the ureters. A kidney blockage that is present at birth (congenital obstruction). Past surgery on the kidney or the ureters, such as gastric bypass surgery. What increases the risk? The following factors may make you more likely to develop this condition: Having had a kidney stone in the past. Having a family history of kidney stones. Not drinking enough water. Eating a diet that is high in protein, salt (sodium), or sugar. Being overweight or obese. What are the signs or symptoms? Symptoms of a kidney stone may include: Pain in the side of the abdomen, right below the ribs (flank pain). Pain usually spreads (radiates) to the groin. Needing to urinate frequently or urgently. Painful  urination. Blood in the urine (hematuria). Nausea. Vomiting. Fever and chills. How is this diagnosed? This condition may be diagnosed based on: Your symptoms and medical history. A physical exam. Blood tests. Urine tests. These may be done before and after the stone passes out of your body through urination. Imaging tests, such as a CT scan, abdominal X-ray, or ultrasound. A procedure to examine the inside of the bladder (cystoscopy). How is this treated? Treatment for kidney stones depends on the size, location, and makeup of the stones. Kidney stones will often pass out of the body through urination. You may need to: Increase your fluid intake to help pass the stone. In some cases, you may be given fluids through an IV and may need to be monitored at the hospital. Take medicine for pain. Make changes in your diet to help prevent kidney stones from coming back. Sometimes, medical procedures are needed to remove a kidney stone. This may involve: A procedure to break up kidney stones using: A focused beam of light (laser therapy). Shock waves (extracorporeal shock wave lithotripsy). Surgery to remove kidney stones. This may be needed if you have severe pain or have stones that block your urinary tract. Follow these instructions at home: Medicines Take over-the-counter and prescription medicines only as told by your health care provider. Ask your health care provider if the medicine prescribed to you requires you to avoid driving or using heavy machinery. Eating and drinking Drink enough fluid to keep your urine pale yellow. You may be instructed to drink at least 8-10 glasses of water each  day. This will help you pass the kidney stone. If directed, change your diet. This may include: Limiting how much sodium you eat. Eating more fruits and vegetables. Limiting how much animal protein--such as red meat, poultry, fish, and eggs--you eat. Follow instructions from your health care  provider about eating or drinking restrictions. General instructions Collect urine samples as told by your health care provider. You may need to collect a urine sample: 24 hours after you pass the stone. 8-12 weeks after passing the kidney stone, and every 6-12 months after that. Strain your urine every time you urinate, for as long as directed. Use the strainer that your health care provider recommends. Do not throw out the kidney stone after passing it. Keep the stone so it can be tested by your health care provider. Testing the makeup of your kidney stone may help prevent you from getting kidney stones in the future. Keep all follow-up visits as told by your health care provider. This is important. You may need follow-up X-rays or ultrasounds to make sure that your stone has passed. How is this prevented? To prevent another kidney stone: Drink enough fluid to keep your urine pale yellow. This is the best way to prevent kidney stones. Eat a healthy diet and follow recommendations from your health care provider about foods to avoid. You may be instructed to eat a low-protein diet. Recommendations vary depending on the type of kidney stone that you have. Maintain a healthy weight. Where to find more information [place] [facility]): www.kidney.[facility]): www.urologyhealth.org Contact a health care provider if: You have pain that gets worse or does not get better with medicine. Get help right away if: You have a fever or chills. You develop severe pain. You develop new abdominal pain. You faint. You are unable to urinate. Summary Kidney stones are solid, rock-like deposits that form inside of the kidneys. Kidney stones can cause nausea, vomiting, blood in the urine, abdominal pain, and the urge to urinate frequently. Treatment for kidney stones depends on the size, location, and makeup of the stones. Kidney stones will often pass out of the body through  urination. Kidney stones can be prevented by drinking enough fluids, eating a healthy diet, and maintaining a healthy weight. This information is not intended to replace advice given to you by your health care provider. Make sure you discuss any questions you have with your health care provider. Document Revised: 10/22/2020 Document Reviewed: 10/06/2020 Elsevier Patient Education  [place].

## 2021-09-03 LAB — URINE CULTURE
MICRO NUMBER:: 13668148
Result:: NO GROWTH
SPECIMEN QUALITY:: ADEQUATE

## 2021-09-03 NOTE — Telephone Encounter (Signed)
Message routed to PCP Eubanks, Jessica K, NP  

## 2021-09-03 NOTE — Telephone Encounter (Signed)
We are still waiting for final urine culture if infection noted will send antibiotics to the pharmacy.

## 2021-09-03 NOTE — Telephone Encounter (Signed)
Message routed to Dinah Ngetich, NP. 

## 2021-10-01 NOTE — Progress Notes (Signed)
Cardiology Office Note:    Date:  10/01/2021   ID:  Tim Walters, DOB 22-Mar-1945, MRN [id number]  PCP:  Lauree Chandler, NP  Cardiologist:  None   Referring MD: Lauree Chandler, NP   No chief complaint on file.   History of Present Illness:    Tim Walters is a 76 y.o. male with a hx of CAD (anterior MI s/p BMS to RCA 2003 with residual LAD/diagonal disease), HTN, HLD, depression, sleep apnea (not using CPAP), GERD, arthritis, MGUS, and pre-diabetes.    Doing well.  Denies orthopnea, PND, syncope, chest pain, lower extremity edema.  No medication side effects.  He had an injury early in 2023, followed by COVID, and then a recurrent injury.  Did not get back into his walking routine until spring.  He has been less energetic.  He is walking about 4 miles per day.  No chest discomfort.  Past Medical History:  Diagnosis Date   Arthritis    lt ankle   Breast lump    Per records from [place]    Colon polyp    Coronary artery disease    a. BMS to RCA 2003 with residual LAD/diag disease treated medically, normal EF.   COVID    Depression    Diverticulosis    Per records from [place]    GERD (gastroesophageal reflux disease)    Hyperkalemia    a. K of 5.2 in 2017.   Hyperlipidemia    Hypertension    Idiopathic peripheral neuropathy    Per records from [place]    Impaired fasting glucose    Per records from [place]    Kidney stones    Monoclonal gammopathy of undetermined significance    Per records from [place]    Myocardial infarction [facility]) 11/15/2001   Dr. Daneen Schick [facility])   OSA (obstructive sleep apnea)    Per [facility] New Patient Packet   Peripheral neuropathy    Left Foot, Per [place] New Patient Packet   Pre-diabetes    Sleep apnea    had test several yr ago-said he did not need a cpap-still snores   Torn rotator cuff    Per [facility] New Patient Packet   Venous insufficiency of left leg    Per [facility]  New Patient Packet   Wears glasses     Past Surgical History:  Procedure Laterality Date   ANGIOPLASTY  2003   Per records from [place]   stent rca   CARDIOVASCULAR STRESS TEST  02/16/2016   Per records from [place]  06/17/2003   Per records from [place], Dr.Ganem to be repeated 2015   coronary artery stent  11/15/2001   CORONARY STENT PLACEMENT  11/15/2001   Per records from [place] Left 05/08/2013   Procedure: CYST REMOVAL BACK;  Surgeon: Adin Hector, MD;  Location: [place];  Service: General;  Laterality: Left;   INGUINAL HERNIA REPAIR Left 03/24/2015   Procedure: OPEN REPAIR LEFT INGUINAL HERNIA ;  Surgeon: Fanny Skates, MD;  Location: [place];  Service: General;  Laterality: Left;   INSERTION OF MESH Left 03/24/2015   Procedure: INSERTION OF MESH;  Surgeon: Fanny Skates, MD;  Location: [place];  Service: General;  Laterality: Left;   SHOULDER ARTHROSCOPY W/ ROTATOR CUFF REPAIR  2011   right  SPINE SURGERY  02/12/1993   L2, L3 fragmented disc   [place]  09/2018   Vein injections     Current Medications: No outpatient medications have been marked as taking for the 10/02/21 encounter (Appointment) with Belva Crome, MD.     Allergies:   Patient has no known allergies.   Social History   Socioeconomic History   Marital status: Married    Spouse name: Katharine Look   Number of children: 7   Years of education: 16   Highest education level: Not on file  Occupational History   Occupation: Retired  Tobacco Use   Smoking status: Former    Years: 12.00    Types: Cigarettes    Quit date: 02/15/1974    Years since quitting: 47.6   Smokeless tobacco: Never  Vaping Use   Vaping Use: Never used  Substance and Sexual Activity   Alcohol use: Yes    Alcohol/week: 14.0 standard drinks of alcohol     Types: 14 Standard drinks or equivalent per week    Comment: 14 drinks weekly in the evening    Drug use: No   Sexual activity: Not Currently  Other Topics Concern   Not on file  Social History Narrative   Lives with wife, Katharine Look   Caffeine use: Coffee daily   Caffeine free soda      As of 03/28/2018:   Diet: N/A      Caffeine: Yes      Married, if yes what year: Yes, 1971      Do you live in a house, apartment, assisted living, condo, trailer, ect: House, one stories, 2 persons      Pets: 1 cat      Current/Past profession: Dietitian, Chief Strategy Officer      Exercise: Yes, cardio strength 5 days weekly          Living Will: No   DNR: No, would like to discuss   POA/HPOA: Yes      Functional Status:   Do you have difficulty bathing or dressing yourself? No   Do you have difficulty preparing food or eating? No   Do you have difficulty managing your medications? No   Do you have difficulty managing your finances? No   Do you have difficulty affording your medications? No   Social Determinants of Radio broadcast assistant Strain: Not on file  Food Insecurity: Not on file  Transportation Needs: Not on file  Physical Activity: Not on file  Stress: Not on file  Social Connections: Not on file     Family History: [name] family history includes Angina in his father; Autism in his son; Bipolar disorder in his son; COPD in his mother; Depression in his father; Diabetes type II in his daughter; Diverticulosis in his father; Heart disease in his father; High Cholesterol in his sister; Hypertension in his father; Lung cancer in his mother; Macular degeneration in his maternal grandmother and mother; Non-Hodgkin's lymphoma in his mother; Post-traumatic stress disorder in his son; Schizophrenia in his son; Suicidality (age of onset: 61) in his father. There is no history of Breast cancer, Colon cancer, Esophageal cancer, Stomach cancer, Liver disease, or Pancreatic  cancer.  ROS:   Please see the history of present illness.    No episodes of syncope, angina, claudication.  Denies palpitations.  All other systems reviewed and are negative.  EKGs/Labs/Other Studies Reviewed:    The following studies were reviewed today:  NUCLEAR PERFUSION IMAGING 09/2020 Study Highlights  Exercise stress test: CLinically negative, electrically positive for ischemia Myoview scan shows normal perfusion with minimal thinning at apex No ischemia or scar. Nuclear stress EF: 62%. This is an intermediate risk study based on EKG findings   EKG:  EKG sinus rhythm, atrial bigeminy, otherwise normal.  Compared to October 02, 2020 with atrial bigeminy is new.  Recent Labs: 05/07/2021: ALT 21; BUN 21; Creat 0.95; Hemoglobin 14.3; Platelets 180; Potassium 4.5; Sodium 140  Recent Lipid Panel    Component Value Date/Time   CHOL 140 05/07/2021 0830   TRIG 58 05/07/2021 0830   HDL 60 05/07/2021 0830   CHOLHDL 2.3 05/07/2021 0830   LDLCALC 67 05/07/2021 0830    Physical Exam:    VS:  There were no vitals taken for this visit.    Wt Readings from Last 3 Encounters:  09/02/21 207 lb (93.9 kg)  05/11/21 203 lb (92.1 kg)  11/10/20 195 lb (88.5 kg)     GEN: Overweight, up 12 pounds since September 2022.. No acute distress HEENT: Normal NECK: No JVD. LYMPHATICS: No lymphadenopathy CARDIAC: No murmur. IRR no gallop, or edema. VASCULAR:  Normal Pulses. No bruits. RESPIRATORY:  Clear to auscultation without rales, wheezing or rhonchi  ABDOMEN: Soft, non-tender, non-distended, No pulsatile mass, MUSCULOSKELETAL: No deformity  SKIN: Warm and dry NEUROLOGIC:  Alert and oriented x 3 PSYCHIATRIC:  Normal affect   ASSESSMENT:    1. CAD in native artery   2. Essential hypertension   3. Hyperlipidemia, unspecified hyperlipidemia type   4. Sleep apnea, unspecified type   5. DOE (dyspnea on exertion)    PLAN:    In order of problems listed above:  Secondary prevention  discussed.  Encouraged continued physical activity to achieve greater than 150 minutes of moderate activity per week. Blood pressure taken personally today is 132/78 mmHg.  Continue 3 drug therapy including Microzide, Altace, and carvedilol. Continue Lipitor.  LDL target less than 70. Continue CPAP. Persisting but improving with weight loss and aerobic activity.  Overall education and awareness concerning secondary risk prevention was discussed in detail: LDL less than 70, hemoglobin A1c less than 7, blood pressure target less than 130/80 mmHg, >150 minutes of moderate aerobic activity per week, avoidance of smoking, weight control (via diet and exercise), and continued surveillance/management of/for obstructive sleep apnea.    Medication Adjustments/Labs and Tests Ordered: Current medicines are reviewed at length with the patient today.  Concerns regarding medicines are outlined above.  No orders of the defined types were placed in this encounter.  No orders of the defined types were placed in this encounter.   There are no Patient Instructions on file for this visit.   Signed, Sinclair Grooms, MD  10/01/2021 4:11 PM    [facility]

## 2021-10-02 ENCOUNTER — Encounter: Payer: Self-pay | Admitting: Interventional Cardiology

## 2021-10-02 ENCOUNTER — Ambulatory Visit: Payer: Medicare Other | Admitting: Interventional Cardiology

## 2021-10-02 VITALS — BP 122/78 | HR 64 | Ht 67.0 in | Wt 204.4 lb

## 2021-10-02 DIAGNOSIS — R0609 Other forms of dyspnea: Secondary | ICD-10-CM

## 2021-10-02 DIAGNOSIS — I10 Essential (primary) hypertension: Secondary | ICD-10-CM

## 2021-10-02 DIAGNOSIS — I2510 Atherosclerotic heart disease of native coronary artery without angina pectoris: Secondary | ICD-10-CM

## 2021-10-02 DIAGNOSIS — G4730 Sleep apnea, unspecified: Secondary | ICD-10-CM | POA: Diagnosis not present

## 2021-10-02 DIAGNOSIS — E785 Hyperlipidemia, unspecified: Secondary | ICD-10-CM | POA: Diagnosis not present

## 2021-10-02 NOTE — Patient Instructions (Signed)

## 2021-10-25 ENCOUNTER — Other Ambulatory Visit: Payer: Self-pay | Admitting: Nurse Practitioner

## 2021-10-25 ENCOUNTER — Other Ambulatory Visit: Payer: Self-pay | Admitting: Interventional Cardiology

## 2021-11-04 ENCOUNTER — Other Ambulatory Visit: Payer: Medicare Other

## 2021-11-04 DIAGNOSIS — I10 Essential (primary) hypertension: Secondary | ICD-10-CM

## 2021-11-04 DIAGNOSIS — R739 Hyperglycemia, unspecified: Secondary | ICD-10-CM | POA: Diagnosis not present

## 2021-11-05 LAB — CBC WITH DIFFERENTIAL/PLATELET
Absolute Monocytes: 770 cells/uL (ref 200–950)
Basophils Absolute: 42 cells/uL (ref 0–200)
Basophils Relative: 0.6 %
Eosinophils Absolute: 168 cells/uL (ref 15–500)
Eosinophils Relative: 2.4 %
HCT: 41.9 % (ref 38.5–50.0)
Hemoglobin: 14.1 g/dL (ref 13.2–17.1)
Lymphs Abs: 1274 cells/uL (ref 850–3900)
MCH: 31.7 pg (ref 27.0–33.0)
MCHC: 33.7 g/dL (ref 32.0–36.0)
MCV: 94.2 fL (ref 80.0–100.0)
MPV: 10.4 fL (ref 7.5–12.5)
Monocytes Relative: 11 %
Neutro Abs: 4746 cells/uL (ref 1500–7800)
Neutrophils Relative %: 67.8 %
Platelets: 191 10*3/uL (ref 140–400)
RBC: 4.45 10*6/uL (ref 4.20–5.80)
RDW: 12.1 % (ref 11.0–15.0)
Total Lymphocyte: 18.2 %
WBC: 7 10*3/uL (ref 3.8–10.8)

## 2021-11-05 LAB — HEMOGLOBIN A1C
Hgb A1c MFr Bld: 5.9 % of total Hgb — ABNORMAL HIGH (ref <5.7)
Mean Plasma Glucose: 123 mg/dL
eAG (mmol/L): 6.8 mmol/L

## 2021-11-05 LAB — COMPLETE METABOLIC PANEL WITH GFR
AG Ratio: 1.4 (calc) (ref 1.0–2.5)
ALT: 21 U/L (ref 9–46)
AST: 19 U/L (ref 10–35)
Albumin: 3.9 g/dL (ref 3.6–5.1)
Alkaline phosphatase (APISO): 66 U/L (ref 35–144)
BUN/Creatinine Ratio: 29 (calc) — ABNORMAL HIGH (ref 6–22)
BUN: 26 mg/dL — ABNORMAL HIGH (ref 7–25)
CO2: 25 mmol/L (ref 20–32)
Calcium: 9.3 mg/dL (ref 8.6–10.3)
Chloride: 106 mmol/L (ref 98–110)
Creat: 0.91 mg/dL (ref 0.70–1.28)
Globulin: 2.8 g/dL (calc) (ref 1.9–3.7)
Glucose, Bld: 126 mg/dL — ABNORMAL HIGH (ref 65–99)
Potassium: 4.2 mmol/L (ref 3.5–5.3)
Sodium: 140 mmol/L (ref 135–146)
Total Bilirubin: 0.6 mg/dL (ref 0.2–1.2)
Total Protein: 6.7 g/dL (ref 6.1–8.1)
eGFR: 87 mL/min/{1.73_m2} (ref >=60)

## 2021-11-09 ENCOUNTER — Ambulatory Visit (INDEPENDENT_AMBULATORY_CARE_PROVIDER_SITE_OTHER): Payer: Medicare Other | Admitting: Nurse Practitioner

## 2021-11-09 ENCOUNTER — Encounter: Payer: Self-pay | Admitting: Nurse Practitioner

## 2021-11-09 VITALS — BP 128/62 | HR 73 | Temp 97.7°F | Ht 67.0 in | Wt 207.8 lb

## 2021-11-09 DIAGNOSIS — E785 Hyperlipidemia, unspecified: Secondary | ICD-10-CM | POA: Diagnosis not present

## 2021-11-09 DIAGNOSIS — I10 Essential (primary) hypertension: Secondary | ICD-10-CM

## 2021-11-09 DIAGNOSIS — R739 Hyperglycemia, unspecified: Secondary | ICD-10-CM

## 2021-11-09 DIAGNOSIS — I25118 Atherosclerotic heart disease of native coronary artery with other forms of angina pectoris: Secondary | ICD-10-CM | POA: Diagnosis not present

## 2021-11-09 DIAGNOSIS — F325 Major depressive disorder, single episode, in full remission: Secondary | ICD-10-CM

## 2021-11-09 DIAGNOSIS — Z23 Encounter for immunization: Secondary | ICD-10-CM | POA: Diagnosis not present

## 2021-11-09 NOTE — Patient Instructions (Signed)
Let us know if you want a referral to pulmonary for further evaluation on sleep

## 2021-11-09 NOTE — Progress Notes (Signed)
Careteam: Patient Care Team: Lauree Chandler, NP as PCP - General (Geriatric Medicine) Belva Crome, MD as PCP - Cardiology (Cardiology) Fanny Skates, MD as Consulting Physician (General Surgery) Marchia Bond, MD as Consulting Physician (Orthopedic Surgery) Jarome Matin, MD as Consulting Physician (Dermatology) Belva Crome, MD as Consulting Physician (Cardiology) Kathrynn Ducking, MD (Inactive) as Consulting Physician (Neurology) Fonnie Mu, OD (Optometry)  PLACE OF SERVICE:  [place] Directive information    No Known Allergies  Chief Complaint  Patient presents with   Medical Management of Chronic Issues    Patient presents today for a 6 month follow-up.   Quality Metric Gaps    COVID#5     HPI: Patient is a 76 y.o. male for routine follow up.  Doing well.  Walks 3-4 miles a day plus 40 mins of training 3 days a week.    CAD- continues on asa, denies chest pains.   Htn - controlled with dietary modifications and continues on coreg, hctz, ramipril  Depression- controlled on zoloft    OSA- unable to tolerate CPAP- wife reports he does not have episodes where he stops breathing that she can tell.  Does report daytime fatigue.     Review of Systems:  Review of Systems  Constitutional:  Negative for chills, fever and weight loss.  HENT:  Negative for tinnitus.   Respiratory:  Negative for cough, sputum production and shortness of breath.   Cardiovascular:  Negative for chest pain, palpitations and leg swelling.  Gastrointestinal:  Negative for abdominal pain, constipation, diarrhea and heartburn.  Genitourinary:  Negative for dysuria, frequency and urgency.  Musculoskeletal:  Negative for back pain, falls, joint pain and myalgias.  Skin: Negative.   Neurological:  Negative for dizziness and headaches.  Psychiatric/Behavioral:  Negative for depression and memory loss. The patient does not have insomnia.     Past Medical History:   Diagnosis Date   Arthritis    lt ankle   Breast lump    Per records from [place]    Colon polyp    Coronary artery disease    a. BMS to RCA 2003 with residual LAD/diag disease treated medically, normal EF.   COVID    Depression    Diverticulosis    Per records from [place]    GERD (gastroesophageal reflux disease)    Hyperkalemia    a. K of 5.2 in 2017.   Hyperlipidemia    Hypertension    Idiopathic peripheral neuropathy    Per records from [facility]    Impaired fasting glucose    Per records from [place]    Kidney stones    Monoclonal gammopathy of undetermined significance    Per records from [place]    Myocardial infarction [facility]) 11/15/2001   Dr. Daneen Schick [facility] Cardiology)   OSA (obstructive sleep apnea)    Per [facility] New Patient Packet   Peripheral neuropathy    Left Foot, Per [place] New Patient Packet   Pre-diabetes    Sleep apnea    had test several yr ago-said he did not need a cpap-still snores   Torn rotator cuff    Per [facility] New Patient Packet   Venous insufficiency of left leg    Per [facility] New Patient Packet   Wears glasses    Past Surgical History:  Procedure Laterality Date   ANGIOPLASTY  2003   Per records from [place]  stent rca   CARDIOVASCULAR STRESS TEST  02/16/2016   Per records from [place]  06/17/2003   Per records from [place], Dr.Ganem to be repeated 2015   coronary artery stent  11/15/2001   CORONARY STENT PLACEMENT  11/15/2001   Per records from [place] Left 05/08/2013   Procedure: CYST REMOVAL BACK;  Surgeon: Adin Hector, MD;  Location: [place];  Service: General;  Laterality: Left;   INGUINAL HERNIA REPAIR Left 03/24/2015   Procedure: OPEN REPAIR LEFT INGUINAL HERNIA ;  Surgeon: Fanny Skates, MD;  Location: [place];  Service: General;  Laterality:  Left;   INSERTION OF MESH Left 03/24/2015   Procedure: INSERTION OF MESH;  Surgeon: Fanny Skates, MD;  Location: [place];  Service: General;  Laterality: Left;   SHOULDER ARTHROSCOPY W/ ROTATOR CUFF REPAIR  2011   right   SPINE SURGERY  02/12/1993   L2, L3 fragmented disc   [place]  09/2018   Vein injections    Social History:   reports that he quit smoking about 47 years ago. His smoking use included cigarettes. He has never used smokeless tobacco. He reports current alcohol use of about 14.0 standard drinks of alcohol per week. He reports that he does not use drugs.  Family History  Problem Relation Age of Onset   Macular degeneration Mother    COPD Mother    Non-Hodgkin's lymphoma Mother    Lung cancer Mother        Per [place] New Patient Packet    Hypertension Father    Suicidality Father 14       Per [place] New Patient Packet    Depression Father    Angina Father        Per records from [place]    Diverticulosis Father        Per records from [place] disease Father    High Cholesterol Sister    Macular degeneration Maternal Grandmother    Schizophrenia Son    Bipolar disorder Son    Autism Son    Post-traumatic stress disorder Son    Diabetes type II Daughter    Breast cancer Neg Hx    Colon cancer Neg Hx    Esophageal cancer Neg Hx    Stomach cancer Neg Hx    Liver disease Neg Hx    Pancreatic cancer Neg Hx     Medications: Patient's Medications  New Prescriptions   No medications on file  Previous Medications   ASPIRIN 81 MG TABLET    Take 1 tablet (81 mg total) by mouth daily.   ATORVASTATIN (LIPITOR) 80 MG TABLET    TAKE 1 TABLET BY MOUTH  DAILY   CARVEDILOL (COREG) 12.5 MG TABLET    TAKE 1 TABLET BY MOUTH TWICE  DAILY   HYDROCHLOROTHIAZIDE (MICROZIDE) 12.5 MG CAPSULE    TAKE 1 CAPSULE BY MOUTH DAILY   MULTIPLE VITAMIN (MULTIVITAMIN WITH MINERALS) TABS TABLET    Take 1 tablet by mouth daily.    OMEPRAZOLE PO    Take 20 mg by mouth every morning.    RAMIPRIL (ALTACE) 10 MG CAPSULE    TAKE 1 CAPSULE BY MOUTH DAILY   SERTRALINE (ZOLOFT) 50 MG TABLET    TAKE 1 TABLET BY MOUTH  DAILY  Modified Medications   No medications on file  Discontinued Medications   No medications on file    Physical Exam:  Vitals:   11/09/21 1352  BP: 128/62  Pulse: 73  Temp: 97.7 F (36.5 C)  SpO2: 97%  Weight: 207 lb 12.8 oz (94.3 kg)  Height: '5\' 7"'$  (1.702 m)   Body mass index is 32.55 kg/m. Wt Readings from Last 3 Encounters:  11/09/21 207 lb 12.8 oz (94.3 kg)  10/02/21 204 lb 6.4 oz (92.7 kg)  09/02/21 207 lb (93.9 kg)    Physical Exam Constitutional:      General: He is not in acute distress.    Appearance: He is well-developed. He is not diaphoretic.  HENT:     Head: Normocephalic and atraumatic.     Right Ear: External ear normal.     Left Ear: External ear normal.     Mouth/Throat:     Pharynx: No oropharyngeal exudate.  Eyes:     Conjunctiva/sclera: Conjunctivae normal.     Pupils: Pupils are equal, round, and reactive to light.  Cardiovascular:     Rate and Rhythm: Normal rate and regular rhythm.     Heart sounds: Normal heart sounds.  Pulmonary:     Effort: Pulmonary effort is normal.     Breath sounds: Normal breath sounds.  Abdominal:     General: Bowel sounds are normal.     Palpations: Abdomen is soft.  Musculoskeletal:        General: No tenderness.     Cervical back: Normal range of motion and neck supple.     Right lower leg: No edema.     Left lower leg: No edema.  Skin:    General: Skin is warm and dry.  Neurological:     Mental Status: He is alert and oriented to person, place, and time.     Labs reviewed: Basic Metabolic Panel: Recent Labs    05/07/21 0830 11/04/21 0829  NA 140 140  K 4.5 4.2  CL 105 106  CO2 29 25  GLUCOSE 122* 126*  BUN 21 26*  CREATININE 0.95 0.91  CALCIUM 9.1 9.3   Liver Function Tests: Recent Labs     05/07/21 0830 11/04/21 0829  AST 22 19  ALT 21 21  BILITOT 0.7 0.6  PROT 6.4 6.7   No results for input(s): "LIPASE", "AMYLASE" in the last 8760 hours. No results for input(s): "AMMONIA" in the last 8760 hours. CBC: Recent Labs    05/07/21 0830 11/04/21 0829  WBC 6.0 7.0  NEUTROABS 4,020 4,746  HGB 14.3 14.1  HCT 43.6 41.9  MCV 96.7 94.2  PLT 180 191   Lipid Panel: Recent Labs    05/07/21 0830  CHOL 140  HDL 60  LDLCALC 67  TRIG 58  CHOLHDL 2.3   TSH: No results for input(s): "TSH" in the last 8760 hours. A1C: Lab Results  Component Value Date   HGBA1C 5.9 (H) 11/04/2021     Assessment/Plan 1. Hyperglycemia -a1c stable, continue exercise with dietary modifications.   2. Essential hypertension, benign -Blood pressure well controlled, goal bp <140/90 Continue current medications and dietary modifications follow metabolic panel  3. Major depressive disorder in full remission, unspecified whether recurrent ([place]) -well controled on zoloft  4. Hyperlipidemia, unspecified hyperlipidemia type -LDL at goal on last labs, continues on Lipitor   5. Need for influenza vaccination - Flu Vaccine QUAD High Dose(Fluad)  6. Atherosclerosis of native coronary artery of native heart with stable angina pectoris ([place]) -stable, continues on coreg and  asa    Return in about 6 months (around 05/10/2022) for routine follow up, labs prior . Tim Walters. [place], [place] [facility] [phone]

## 2021-11-13 DIAGNOSIS — E119 Type 2 diabetes mellitus without complications: Secondary | ICD-10-CM | POA: Diagnosis not present

## 2022-01-05 ENCOUNTER — Ambulatory Visit: Payer: Medicare Other | Admitting: Podiatry

## 2022-03-02 DIAGNOSIS — L298 Other pruritus: Secondary | ICD-10-CM | POA: Diagnosis not present

## 2022-03-02 DIAGNOSIS — L570 Actinic keratosis: Secondary | ICD-10-CM | POA: Diagnosis not present

## 2022-03-02 DIAGNOSIS — L538 Other specified erythematous conditions: Secondary | ICD-10-CM | POA: Diagnosis not present

## 2022-03-02 DIAGNOSIS — L989 Disorder of the skin and subcutaneous tissue, unspecified: Secondary | ICD-10-CM | POA: Diagnosis not present

## 2022-03-02 DIAGNOSIS — L820 Inflamed seborrheic keratosis: Secondary | ICD-10-CM | POA: Diagnosis not present

## 2022-03-02 DIAGNOSIS — Z789 Other specified health status: Secondary | ICD-10-CM | POA: Diagnosis not present

## 2022-03-02 DIAGNOSIS — D485 Neoplasm of uncertain behavior of skin: Secondary | ICD-10-CM | POA: Diagnosis not present

## 2022-03-23 DIAGNOSIS — C4491 Basal cell carcinoma of skin, unspecified: Secondary | ICD-10-CM | POA: Insufficient documentation

## 2022-03-23 DIAGNOSIS — L989 Disorder of the skin and subcutaneous tissue, unspecified: Secondary | ICD-10-CM | POA: Diagnosis not present

## 2022-03-23 DIAGNOSIS — C44619 Basal cell carcinoma of skin of left upper limb, including shoulder: Secondary | ICD-10-CM | POA: Diagnosis not present

## 2022-03-23 DIAGNOSIS — L570 Actinic keratosis: Secondary | ICD-10-CM | POA: Diagnosis not present

## 2022-04-22 ENCOUNTER — Other Ambulatory Visit: Payer: Self-pay | Admitting: Nurse Practitioner

## 2022-04-22 DIAGNOSIS — E785 Hyperlipidemia, unspecified: Secondary | ICD-10-CM

## 2022-04-22 DIAGNOSIS — I10 Essential (primary) hypertension: Secondary | ICD-10-CM

## 2022-04-22 DIAGNOSIS — R739 Hyperglycemia, unspecified: Secondary | ICD-10-CM

## 2022-04-29 ENCOUNTER — Encounter: Payer: Self-pay | Admitting: Gastroenterology

## 2022-04-30 ENCOUNTER — Other Ambulatory Visit: Payer: Self-pay | Admitting: Nurse Practitioner

## 2022-05-05 ENCOUNTER — Other Ambulatory Visit: Payer: Medicare Other

## 2022-05-05 DIAGNOSIS — R739 Hyperglycemia, unspecified: Secondary | ICD-10-CM

## 2022-05-05 DIAGNOSIS — E785 Hyperlipidemia, unspecified: Secondary | ICD-10-CM

## 2022-05-05 DIAGNOSIS — I10 Essential (primary) hypertension: Secondary | ICD-10-CM | POA: Diagnosis not present

## 2022-05-06 LAB — CBC WITH DIFFERENTIAL/PLATELET
Absolute Monocytes: 644 cells/uL (ref 200–950)
Basophils Absolute: 39 cells/uL (ref 0–200)
Basophils Relative: 0.7 %
Eosinophils Absolute: 196 cells/uL (ref 15–500)
Eosinophils Relative: 3.5 %
HCT: 41.9 % (ref 38.5–50.0)
Hemoglobin: 14 g/dL (ref 13.2–17.1)
Lymphs Abs: 1154 cells/uL (ref 850–3900)
MCH: 31.2 pg (ref 27.0–33.0)
MCHC: 33.4 g/dL (ref 32.0–36.0)
MCV: 93.3 fL (ref 80.0–100.0)
MPV: 9.9 fL (ref 7.5–12.5)
Monocytes Relative: 11.5 %
Neutro Abs: 3567 cells/uL (ref 1500–7800)
Neutrophils Relative %: 63.7 %
Platelets: 171 10*3/uL (ref 140–400)
RBC: 4.49 10*6/uL (ref 4.20–5.80)
RDW: 12.4 % (ref 11.0–15.0)
Total Lymphocyte: 20.6 %
WBC: 5.6 10*3/uL (ref 3.8–10.8)

## 2022-05-06 LAB — COMPLETE METABOLIC PANEL WITH GFR
AG Ratio: 1.4 (calc) (ref 1.0–2.5)
ALT: 22 U/L (ref 9–46)
AST: 22 U/L (ref 10–35)
Albumin: 4 g/dL (ref 3.6–5.1)
Alkaline phosphatase (APISO): 63 U/L (ref 35–144)
BUN/Creatinine Ratio: 31 (calc) — ABNORMAL HIGH (ref 6–22)
BUN: 31 mg/dL — ABNORMAL HIGH (ref 7–25)
CO2: 24 mmol/L (ref 20–32)
Calcium: 9.4 mg/dL (ref 8.6–10.3)
Chloride: 105 mmol/L (ref 98–110)
Creat: 1 mg/dL (ref 0.70–1.28)
Globulin: 2.8 g/dL (calc) (ref 1.9–3.7)
Glucose, Bld: 103 mg/dL — ABNORMAL HIGH (ref 65–99)
Potassium: 4.2 mmol/L (ref 3.5–5.3)
Sodium: 139 mmol/L (ref 135–146)
Total Bilirubin: 0.4 mg/dL (ref 0.2–1.2)
Total Protein: 6.8 g/dL (ref 6.1–8.1)
eGFR: 77 mL/min/{1.73_m2} (ref >=60)

## 2022-05-06 LAB — LIPID PANEL
Cholesterol: 135 mg/dL (ref <200)
HDL: 62 mg/dL (ref >=40)
LDL Cholesterol (Calc): 60 mg/dL (calc)
Non-HDL Cholesterol (Calc): 73 mg/dL (calc) (ref <130)
Total CHOL/HDL Ratio: 2.2 (calc) (ref <5.0)
Triglycerides: 51 mg/dL (ref <150)

## 2022-05-06 LAB — HEMOGLOBIN A1C
Hgb A1c MFr Bld: 6.4 % of total Hgb — ABNORMAL HIGH (ref <5.7)
Mean Plasma Glucose: 137 mg/dL
eAG (mmol/L): 7.6 mmol/L

## 2022-05-10 ENCOUNTER — Ambulatory Visit (INDEPENDENT_AMBULATORY_CARE_PROVIDER_SITE_OTHER): Payer: Medicare Other | Admitting: Nurse Practitioner

## 2022-05-10 ENCOUNTER — Encounter: Payer: Self-pay | Admitting: Nurse Practitioner

## 2022-05-10 VITALS — BP 130/74 | HR 83 | Temp 97.9°F | Resp 16 | Ht 67.0 in | Wt 201.2 lb

## 2022-05-10 DIAGNOSIS — Z6831 Body mass index (BMI) 31.0-31.9, adult: Secondary | ICD-10-CM

## 2022-05-10 DIAGNOSIS — I700 Atherosclerosis of aorta: Secondary | ICD-10-CM | POA: Diagnosis not present

## 2022-05-10 DIAGNOSIS — F325 Major depressive disorder, single episode, in full remission: Secondary | ICD-10-CM

## 2022-05-10 DIAGNOSIS — I10 Essential (primary) hypertension: Secondary | ICD-10-CM

## 2022-05-10 DIAGNOSIS — R5381 Other malaise: Secondary | ICD-10-CM

## 2022-05-10 DIAGNOSIS — R5383 Other fatigue: Secondary | ICD-10-CM

## 2022-05-10 DIAGNOSIS — M7061 Trochanteric bursitis, right hip: Secondary | ICD-10-CM | POA: Diagnosis not present

## 2022-05-10 DIAGNOSIS — R911 Solitary pulmonary nodule: Secondary | ICD-10-CM | POA: Diagnosis not present

## 2022-05-10 DIAGNOSIS — K219 Gastro-esophageal reflux disease without esophagitis: Secondary | ICD-10-CM

## 2022-05-10 DIAGNOSIS — R739 Hyperglycemia, unspecified: Secondary | ICD-10-CM | POA: Diagnosis not present

## 2022-05-10 DIAGNOSIS — E6609 Other obesity due to excess calories: Secondary | ICD-10-CM

## 2022-05-10 NOTE — Progress Notes (Unsigned)
   This service is provided via telemedicine  No vital signs collected/recorded due to the encounter was a telemedicine visit.   Location of patient (ex: home, work):  Home  Patient consents to a telephone visit: Yes, see telephone visit dated 05/11/2022  Location of the provider (ex: office, home): [place], Remote Location   Name of any referring provider:  N/A  Names of all persons participating in the telemedicine service and their role in the encounter:  S.Chrae B/CMA, Sherrie Mustache, NP, and Patient   Time spent on call:  7 min with medical assistant

## 2022-05-10 NOTE — Progress Notes (Signed)
Careteam: Patient Care Team: Lauree Chandler, NP as PCP - General (Geriatric Medicine) Belva Crome, MD (Inactive) as PCP - Cardiology (Cardiology) Fanny Skates, MD as Consulting Physician (General Surgery) Marchia Bond, MD as Consulting Physician (Orthopedic Surgery) Jarome Matin, MD as Consulting Physician (Dermatology) Belva Crome, MD (Inactive) as Consulting Physician (Cardiology) Kathrynn Ducking, MD (Inactive) as Consulting Physician (Neurology) Fonnie Mu, OD (Optometry)  PLACE OF SERVICE:  [place] Directive information    No Known Allergies  Chief Complaint  Patient presents with   Results    Go over labs     HPI: Patient is a 77 y.o. male here for routine management of chronic medical conditions.  He has had some right hip pain for a while that has started back up again in the last 2 weeks. He is stretching again and taking advil every 6 hours.   He normally walks about 4 miles to the gym three times a week and then has about 45 min of strength training. However lately his hip has been bothering him so he has been driving. The pain is about a 7/8 out of 10. The advil brings it down to about a 3 or 4.   A1C elevated slightly to 6.4. He drinks a few drinks every night. He does not eat a lot of carbohydrates or sweetened drinks and does have a treat every now and then.   Denies palpitations, dizziness, headaches. Denies swelling. Mood stable. Sleeping well. Gets up a few times at night to urinate. Takes his HCTZ in the morning. No blood in urine or stool. Bowels moving regularly and well denies issues with no constipation or diarrhea. Denies indigestion or reflux, stable on omeprazole.   He is wondering if he needs another covid vaccine and he is going to schedule his colonoscopy.  Review of Systems:  Review of Systems  Constitutional:  Negative for chills, fever, malaise/fatigue and weight loss.  HENT:  Negative for congestion and sore  throat.   Eyes:  Negative for blurred vision.  Respiratory:  Positive for shortness of breath. Negative for cough and wheezing.        Dyspnea with exertion, for last 6 months but worsening lately.  Cardiovascular:  Negative for chest pain, palpitations and leg swelling.  Gastrointestinal:  Negative for abdominal pain, blood in stool, constipation, diarrhea, heartburn, nausea and vomiting.  Genitourinary:  Negative for dysuria, frequency, hematuria and urgency.  Musculoskeletal:  Positive for joint pain. Negative for falls.       R hip pain  Skin:  Negative for rash.  Neurological:  Negative for dizziness, tingling and headaches.  Endo/Heme/Allergies:  Negative for polydipsia.  Psychiatric/Behavioral:  Negative for depression. The patient is not nervous/anxious.     Past Medical History:  Diagnosis Date   Arthritis    lt ankle   Breast lump    Per records from [place]    Colon polyp    Coronary artery disease    a. BMS to RCA 2003 with residual LAD/diag disease treated medically, normal EF.   COVID    Depression    Diverticulosis    Per records from [place]    GERD (gastroesophageal reflux disease)    Hyperkalemia    a. K of 5.2 in 2017.   Hyperlipidemia    Hypertension    Idiopathic peripheral neuropathy    Per records from [facility]    Impaired fasting glucose    Per records  from [place]    Kidney stones    Monoclonal gammopathy of undetermined significance    Per records from [place]    Myocardial infarction [facility]) 11/15/2001   Dr. Daneen Schick [facility] Cardiology)   OSA (obstructive sleep apnea)    Per [facility] New Patient Packet   Peripheral neuropathy    Left Foot, Per [place] New Patient Packet   Pre-diabetes    Sleep apnea    had test several yr ago-said he did not need a cpap-still snores   Torn rotator cuff    Per [facility] New Patient Packet   Venous insufficiency of left leg    Per [facility] New Patient Packet   Wears glasses     Past Surgical History:  Procedure Laterality Date   ANGIOPLASTY  2003   Per records from [place]     left tricep   CARDIAC CATHETERIZATION  23003   stent rca   CARDIOVASCULAR STRESS TEST  02/16/2016   Per records from [place]  06/17/2003   Per records from [place], Dr.Ganem to be repeated 2015   coronary artery stent  11/15/2001   CORONARY STENT PLACEMENT  11/15/2001   Per records from [place] Left 05/08/2013   Procedure: CYST REMOVAL BACK;  Surgeon: Adin Hector, MD;  Location: [place];  Service: General;  Laterality: Left;   INGUINAL HERNIA REPAIR Left 03/24/2015   Procedure: OPEN REPAIR LEFT INGUINAL HERNIA ;  Surgeon: Fanny Skates, MD;  Location: [place];  Service: General;  Laterality: Left;   INSERTION OF MESH Left 03/24/2015   Procedure: INSERTION OF MESH;  Surgeon: Fanny Skates, MD;  Location: [place];  Service: General;  Laterality: Left;   SHOULDER ARTHROSCOPY W/ ROTATOR CUFF REPAIR  2011   right   SPINE SURGERY  02/12/1993   L2, L3 fragmented disc   [place]  09/2018   Vein injections    Social History:   reports that he quit smoking about 48 years ago. His smoking use included cigarettes. He has never used smokeless tobacco. He reports current alcohol use of about 14.0 standard drinks of alcohol per week. He reports that he does not use drugs.  Family History  Problem Relation Age of Onset   Macular degeneration Mother    COPD Mother    Non-Hodgkin's lymphoma Mother    Lung cancer Mother        Per [place] New Patient Packet    Hypertension Father    Suicidality Father 63       Per [place] New Patient Packet    Depression Father    Angina Father        Per records from [place]    Diverticulosis Father        Per records from [place] disease Father     High Cholesterol Sister    Macular degeneration Maternal Grandmother    Schizophrenia Son    Bipolar disorder Son    Autism Son    Post-traumatic stress disorder Son    Diabetes type II Daughter    Breast cancer Neg Hx    Colon cancer Neg Hx    Esophageal cancer Neg Hx    Stomach cancer Neg Hx    Liver disease Neg Hx    Pancreatic cancer Neg Hx  Medications: Patient's Medications  New Prescriptions   No medications on file  Previous Medications   ASPIRIN 81 MG TABLET    Take 1 tablet (81 mg total) by mouth daily.   ATORVASTATIN (LIPITOR) 80 MG TABLET    TAKE 1 TABLET BY MOUTH ONCE  DAILY   CARVEDILOL (COREG) 12.5 MG TABLET    TAKE 1 TABLET BY MOUTH TWICE  DAILY   HYDROCHLOROTHIAZIDE (MICROZIDE) 12.5 MG CAPSULE    TAKE 1 CAPSULE BY MOUTH DAILY   IBUPROFEN (ADVIL) 200 MG TABLET    Take 200 mg by mouth every 6 (six) hours as needed.   MULTIPLE VITAMIN (MULTIVITAMIN WITH MINERALS) TABS TABLET    Take 1 tablet by mouth daily.   OMEPRAZOLE PO    Take 20 mg by mouth every morning.    RAMIPRIL (ALTACE) 10 MG CAPSULE    TAKE 1 CAPSULE BY MOUTH DAILY   SERTRALINE (ZOLOFT) 50 MG TABLET    TAKE 1 TABLET BY MOUTH  DAILY  Modified Medications   No medications on file  Discontinued Medications   No medications on file    Physical Exam:  Vitals:   05/10/22 1500  BP: 130/74  Pulse: 83  Resp: 16  Temp: 97.9 F (36.6 C)  TempSrc: Temporal  SpO2: 96%  Weight: 201 lb 3.2 oz (91.3 kg)  Height: 5\' 7"  (1.702 m)   Body mass index is 31.51 kg/m. Wt Readings from Last 3 Encounters:  05/10/22 201 lb 3.2 oz (91.3 kg)  11/09/21 207 lb 12.8 oz (94.3 kg)  10/02/21 204 lb 6.4 oz (92.7 kg)    Physical Exam Vitals reviewed.  Cardiovascular:     Rate and Rhythm: Normal rate and regular rhythm.     Heart sounds: Normal heart sounds. No murmur heard. Pulmonary:     Breath sounds: Normal breath sounds. No wheezing or rales.  Abdominal:     General: Bowel sounds are normal.      Palpations: Abdomen is soft. There is no mass.     Tenderness: There is no abdominal tenderness. There is no guarding.  Musculoskeletal:        General: Tenderness present. No swelling.     Comments: Pain upon palpation of R hip and with passive ROM  Skin:    General: Skin is warm and dry.  Neurological:     Mental Status: He is alert and oriented to person, place, and time. Mental status is at baseline.  Psychiatric:        Mood and Affect: Mood normal.        Behavior: Behavior normal.        Judgment: Judgment normal.     Labs reviewed: Basic Metabolic Panel: Recent Labs    11/04/21 0829 05/05/22 0815  NA 140 139  K 4.2 4.2  CL 106 105  CO2 25 24  GLUCOSE 126* 103*  BUN 26* 31*  CREATININE 0.91 1.00  CALCIUM 9.3 9.4   Liver Function Tests: Recent Labs    11/04/21 0829 05/05/22 0815  AST 19 22  ALT 21 22  BILITOT 0.6 0.4  PROT 6.7 6.8   No results for input(s): "LIPASE", "AMYLASE" in the last 8760 hours. No results for input(s): "AMMONIA" in the last 8760 hours. CBC: Recent Labs    11/04/21 0829 05/05/22 0815  WBC 7.0 5.6  NEUTROABS 4,746 3,567  HGB 14.1 14.0  HCT 41.9 41.9  MCV 94.2 93.3  PLT 191 171   Lipid Panel: Recent Labs  05/05/22 0815  CHOL 135  HDL 62  LDLCALC 60  TRIG 51  CHOLHDL 2.2   TSH: No results for input(s): "TSH" in the last 8760 hours. A1C: Lab Results  Component Value Date   HGBA1C 6.4 (H) 05/05/2022     Assessment/Plan 1. Hyperglycemia Pt A1C elevated to 6.4. Patient reports drinking a few drinks every night. Discussed cutting back on alcohol. He is unable to exercise as he used to due to increased fatigue and R hip pain but is hopeful to increase his physical activity back to normal levels soon if his hip pain abides.  2. Essential hypertension, benign BP at goal today. Continue hydrochlorothiazide and ramipril and low sodium diet.  3. Major depressive disorder in full remission, unspecified whether recurrent  (HCC) Mood stable on sertraline.   4. Gastroesophageal reflux disease without esophagitis Symptoms controlled on omeprazole.   5. Aortic atherosclerosis ([place]) Monitored by cardiology. Continue lipitor and low fat diet. Encouraged patient to make healthy lifestyle and diet choices. Continues on ASA and statin  6. Trochanteric bursitis of right hip Stop NSAIDs. Start tylenol 1000mg  every 8 hours as needed pain. Ice 2-3 times daily,  Do stretches/exercises as tolerated. Declines PT at this time as wife is a Art gallery manager and she can assist him with stretching.  7. Class 1 obesity due to excess calories with serious comorbidity and body mass index (BMI) of 31.0 to 31.9 in adult Pt continues to gain weight. He has been less active due to hip pain and increased fatigue with exertion. Discussed patient losing weight and how it could benefit numerous conditions he has including diabetes, heart conditions, and joint pain.  9. Lung nodule Pt has increasing dyspnea on exertion and hx of smoking.  - CT CHEST LUNG CA SCREEN LOW DOSE W/O CM; Future  10. Debility  Continues to have shortness of breath with exertion. Discussed with patient; ongoing for about 6 months but worse recently. Could be related to pt weight gain. F/u CT scan and patient to focus on weight loss as well. Just saw cardiology in August. Dr. Tamala Julian is retired, pt to call cardiology office to establish care with new cardiologist.    Return in about 4 weeks (around 06/07/2022) for hip pain.  Student- Archer Asa O'Berry ACPCNP-S  I personally was present during the history, physical exam and medical decision-making activities of this service and have verified that the service and findings are accurately documented in the student's note Benjie Ricketson K. [place], [place] [facility] [phone]

## 2022-05-11 ENCOUNTER — Encounter: Payer: Medicare Other | Admitting: Nurse Practitioner

## 2022-05-11 ENCOUNTER — Encounter: Payer: Self-pay | Admitting: Gastroenterology

## 2022-05-11 ENCOUNTER — Encounter: Payer: Self-pay | Admitting: Nurse Practitioner

## 2022-05-11 ENCOUNTER — Telehealth: Payer: Self-pay

## 2022-05-11 NOTE — Telephone Encounter (Signed)
I received secure chat message from [facility]) informing me that she had sent a message to Sherrie Mustache regarding patient (I do not know what the message said), but she informed me that Sherrie Mustache, NP said that it was ok if patient did not get lung CT. I also received a message on clinical intake voicemail from [place] at [facility] imaging. She was calling about lung CT and stating that since patient stopped smoking 15 years ago he could get CT without contrast.  I called back and informed [facility] that order for CT is just going to be cancelled because provider did not deem necessary.

## 2022-05-11 NOTE — Progress Notes (Deleted)
Subjective:   Tim Walters is a 77 y.o. male who presents for Medicare Annual/Subsequent preventive examination.  Review of Systems    ***       Objective:    There were no vitals filed for this visit. There is no height or weight on file to calculate BMI.     05/11/2022    8:41 AM 05/10/2022    4:44 PM 09/02/2021    1:15 PM 05/11/2021    2:44 PM 05/05/2021    2:52 PM 11/10/2020   11:33 AM 11/10/2020    9:48 AM  Advanced Directives  Does Patient Have a Medical Advance Directive? Yes Yes Yes Yes Yes Yes Yes  Type of Advance Directive Out of facility DNR (pink MOST or yellow form) Out of facility DNR (pink MOST or yellow form) [place];Living will;Out of facility DNR (pink MOST or yellow form) Out of facility DNR (pink MOST or yellow form) Out of facility DNR (pink MOST or yellow form) Out of facility DNR (pink MOST or yellow form) Out of facility DNR (pink MOST or yellow form)  Does patient want to make changes to medical advance directive? No - Patient declined No - Patient declined No - Patient declined No - Patient declined No - Patient declined No - Patient declined No - Patient declined  Copy of [place] in Chart?   No - copy requested      Pre-existing out of facility DNR order (yellow form or pink MOST form) Pink MOST form placed in chart (order not valid for inpatient use) Pink MOST form placed in chart (order not valid for inpatient use)  Pink MOST form placed in chart (order not valid for inpatient use) Pink MOST form placed in chart (order not valid for inpatient use) Pink MOST form placed in chart (order not valid for inpatient use) Pink MOST form placed in chart (order not valid for inpatient use)    Current Medications (verified) Outpatient Encounter Medications as of 05/11/2022  Medication Sig  . aspirin 81 MG tablet Take 1 tablet (81 mg total) by mouth daily.  Marland Kitchen atorvastatin (LIPITOR) 80 MG tablet TAKE 1 TABLET BY MOUTH ONCE   DAILY  . carvedilol (COREG) 12.5 MG tablet TAKE 1 TABLET BY MOUTH TWICE  DAILY  . hydrochlorothiazide (MICROZIDE) 12.5 MG capsule TAKE 1 CAPSULE BY MOUTH DAILY  . ibuprofen (ADVIL) 200 MG tablet Take 200 mg by mouth every 6 (six) hours as needed.  . Multiple Vitamin (MULTIVITAMIN WITH MINERALS) TABS tablet Take 1 tablet by mouth daily.  Marland Kitchen OMEPRAZOLE PO Take 20 mg by mouth every morning.   . ramipril (ALTACE) 10 MG capsule TAKE 1 CAPSULE BY MOUTH DAILY  . sertraline (ZOLOFT) 50 MG tablet TAKE 1 TABLET BY MOUTH  DAILY   No facility-administered encounter medications on file as of 05/11/2022.    Allergies (verified) Patient has no known allergies.   History: Past Medical History:  Diagnosis Date  . Arthritis    lt ankle  . Breast lump    Per [facility]   . Colon polyp   . Coronary artery disease    a. BMS to RCA 2003 with residual LAD/diag disease treated medically, normal EF.  Marland Kitchen COVID   . Depression   . Diverticulosis    Per records from [facility]   . GERD (gastroesophageal reflux disease)   . Hyperkalemia    a. K of 5.2 in 2017.  Marland Kitchen Hyperlipidemia   .  Hypertension   . Idiopathic peripheral neuropathy    Per records from [facility]   . Impaired fasting glucose    Per records from [facility]   . Kidney stones   . Monoclonal gammopathy of undetermined significance    Per records from  [facility]   . Myocardial infarction ([place]) 11/15/2001   Dr. Daneen Schick [facility])  . OSA (obstructive sleep apnea)    Per [facility] New Patient Packet  . Peripheral neuropathy    Left Foot, Per [place] New Patient Packet  . Pre-diabetes   . Sleep apnea    had test several yr ago-said he did not need a cpap-still snores  . Torn rotator cuff    Per [place] Patient Packet  . Venous insufficiency of left leg    Per [place] Patient Packet  . Wears glasses    Past Surgical History:  Procedure Laterality Date  . ANGIOPLASTY  2003   Per records from  [facility]   . BASAL CELL CARCINOMA EXCISION     left tricep  . CARDIAC CATHETERIZATION  23003   stent rca  . CARDIOVASCULAR STRESS TEST  02/16/2016   Per records from [facility]   . COLONOSCOPY  06/17/2003   Per records from [facility], Dr.Ganem to be repeated 2015  . coronary artery stent  11/15/2001  . CORONARY STENT PLACEMENT  11/15/2001   Per records from [facility]   . CYST REMOVAL TRUNK Left 05/08/2013   Procedure: CYST REMOVAL BACK;  Surgeon: Adin Hector, MD;  Location: [place];  Service: General;  Laterality: Left;  . INGUINAL HERNIA REPAIR Left 03/24/2015   Procedure: OPEN REPAIR LEFT INGUINAL HERNIA ;  Surgeon: Fanny Skates, MD;  Location: [place];  Service: General;  Laterality: Left;  . INSERTION OF MESH Left 03/24/2015   Procedure: INSERTION OF MESH;  Surgeon: Fanny Skates, MD;  Location: [place];  Service: General;  Laterality: Left;  . SHOULDER ARTHROSCOPY W/ ROTATOR CUFF REPAIR  2011   right  . SPINE SURGERY  02/12/1993   L2, L3 fragmented disc  . VASECTOMY  1987  . VEIN REPAIR  09/2018   Vein injections    Family History  Problem Relation Age of Onset  . Macular degeneration Mother   . COPD Mother   . Non-Hodgkin's lymphoma Mother   . Lung cancer Mother        Per [facility] New Patient Packet   . Hypertension Father   . Suicidality Father 52       Per [place] New Patient Packet   . Depression Father   . Angina Father        Per records from [facility]   . Diverticulosis Father        Per [facility] from [facility]   . Heart disease Father   . High Cholesterol Sister   . Macular degeneration Maternal Grandmother   . Schizophrenia Son   . Bipolar disorder Son   . Autism Son   . Post-traumatic stress disorder Son   . Diabetes type II Daughter   . Breast cancer Neg Hx   . Colon cancer Neg Hx   . Esophageal cancer Neg Hx   . Stomach cancer Neg Hx   . Liver disease  Neg Hx   . Pancreatic cancer Neg Hx    Social History   Socioeconomic History  . Marital status: Married    Spouse name: Katharine Look  .  Number of children: 7  . Years of education: 68  . Highest education level: Bachelor's degree (e.g., BA, AB, BS)  Occupational History  . Occupation: Retired  Tobacco Use  . Smoking status: Former    Years: 12    Types: Cigarettes    Quit date: 02/15/1974    Years since quitting: 48.2  . Smokeless tobacco: Never  Vaping Use  . Vaping Use: Never used  Substance and Sexual Activity  . Alcohol use: Yes    Alcohol/week: 14.0 standard drinks of alcohol    Types: 14 Standard drinks or equivalent per week    Comment: 14 drinks weekly in the evening   . Drug use: No  . Sexual activity: Not Currently  Other Topics Concern  . Not on file  Social History Narrative   Lives with wife, Katharine Look   Caffeine use: Coffee daily   Caffeine free soda      As of 03/28/2018:   Diet: N/A      Caffeine: Yes      Married, if yes what year: Yes, 1971      Do you live in a house, apartment, assisted living, condo, trailer, ect: House, one stories, 2 persons      Pets: 1 cat      Current/Past profession: Dietitian, Chief Strategy Officer      Exercise: Yes, cardio strength 5 days weekly          Living Will: No   DNR: No, would like to discuss   POA/HPOA: Yes      Functional Status:   Do you have difficulty bathing or dressing yourself? No   Do you have difficulty preparing food or eating? No   Do you have difficulty managing your medications? No   Do you have difficulty managing your finances? No   Do you have difficulty affording your medications? No   Social Determinants of Health   Financial Resource Strain: Low Risk  (05/06/2022)   Overall Financial Resource Strain (CARDIA)   . Difficulty of Paying Living Expenses: Not hard at all  Food Insecurity: No Food Insecurity (05/06/2022)   Hunger Vital Sign   . Worried About Charity fundraiser in the Last  Year: Never true   . Ran Out of Food in the Last Year: Never true  Transportation Needs: No Transportation Needs (05/06/2022)   PRAPARE - Transportation   . Lack of Transportation (Medical): No   . Lack of Transportation (Non-Medical): No  Physical Activity: Sufficiently Active (05/06/2022)   Exercise Vital Sign   . Days of Exercise per Week: 4 days   . Minutes of Exercise per Session: 90 min  Stress: No Stress Concern Present (05/06/2022)   [place]   . Feeling of Stress : Not at all  Social Connections: Moderately Isolated (05/06/2022)   Social Connection and Isolation Panel [NHANES]   . Frequency of Communication with Friends and Family: Once a week   . Frequency of Social Gatherings with Friends and Family: More than three times a week   . Attends Religious Services: Never   . Active Member of Clubs or Organizations: No   . Attends Archivist Meetings: Not on file   . Marital Status: Married    Tobacco Counseling Counseling given: Not Answered   Clinical Intake:                 Diabetic?***         Activities  of Daily Living     No data to display           Patient Care Team: Lauree Chandler, NP as PCP - General (Geriatric Medicine) Belva Crome, MD (Inactive) as PCP - Cardiology (Cardiology) Fanny Skates, MD as Consulting Physician (General Surgery) Marchia Bond, MD as Consulting Physician (Orthopedic Surgery) Jarome Matin, MD as Consulting Physician (Dermatology) Belva Crome, MD (Inactive) as Consulting Physician (Cardiology) Kathrynn Ducking, MD (Inactive) as Consulting Physician (Neurology) Fonnie Mu, OD (Optometry) [facility]  Indicate any recent Medical Services you may have received from other than [facility] providers in the past year (date may be approximate).     Assessment:   This is a routine wellness examination for  [place].  Hearing/Vision screen Hearing Screening - Comments:: No hearing issues  Vision Screening - Comments:: Last eye exam with [facility] on 11/13/21, Dr.Woods,Joel   Dietary issues and exercise activities discussed:     Goals Addressed   None   Depression Screen    05/11/2022    8:40 AM 05/10/2022    2:59 PM 05/11/2021    2:44 PM 05/05/2021    2:49 PM 04/30/2020   10:00 AM 04/30/2019    1:06 PM 07/11/2018    9:59 AM  PHQ 2/9 Scores  PHQ - 2 Score 0 0 0 0 0 0 0  PHQ- 9 Score  3         Fall Risk    05/11/2022    8:40 AM 05/10/2022    2:59 PM 09/02/2021    1:15 PM 05/11/2021    2:44 PM 05/05/2021    2:51 PM  Fall Risk   Falls in the past year? 0 0 0 0 0  Number falls in past yr: 0 0 0 0 0  Injury with Fall? 0 0 0 0 0  Risk for fall due to : No Fall Risks No Fall Risks No Fall Risks No Fall Risks No Fall Risks  Follow up Falls evaluation completed  Falls evaluation completed Falls evaluation completed Falls evaluation completed    FALL RISK PREVENTION PERTAINING TO THE HOME:  Any stairs in or around the home? {YES/NO:21197} If so, are there any without handrails? {YES/NO:21197} Home free of loose throw rugs in walkways, pet beds, electrical cords, etc? {YES/NO:21197} Adequate lighting in your home to reduce risk of falls? {YES/NO:21197}  ASSISTIVE DEVICES UTILIZED TO PREVENT FALLS:  Life alert? {YES/NO:21197} Use of a cane, walker or w/c? {YES/NO:21197} Grab bars in the bathroom? {YES/NO:21197} Shower chair or bench in shower? {YES/NO:21197} Elevated toilet seat or a handicapped toilet? {YES/NO:21197}  TIMED UP AND GO:  Was the test performed? {YES/NO:21197}.  Length of time to ambulate 10 feet: *** sec.   {Appearance of YN:[id number]  Cognitive Function:    04/26/2018    2:19 PM  MMSE - Mini Mental State Exam  Orientation to time 5  Orientation to Place 5  Registration 3  Recall 3  Language- name 2 objects 2  Language- repeat 1  Language- follow 3 step  command 3  Language- read & follow direction 1  Write a sentence 1  Copy design 1        05/11/2022    8:42 AM 05/05/2021    2:53 PM 04/30/2020   10:03 AM 04/30/2019    1:07 PM  6CIT Screen  What Year? 0 points 0 points 0 points 0 points  What month? 0 points 0 points 0  points 0 points  What time? 0 points 0 points 0 points 0 points  Count back from 20 0 points 0 points 0 points 0 points  Months in reverse 0 points 0 points 0 points 0 points  Repeat phrase 0 points 0 points 0 points 0 points  Total Score 0 points 0 points 0 points 0 points    Immunizations Immunization History  Administered Date(s) Administered  . Fluad Quad(high Dose 65+) 11/06/2018, 11/10/2020, 11/09/2021  . Influenza, High Dose Seasonal PF 10/30/2019  . Influenza, Seasonal, Injecte, Preservative Fre 02/15/2012  . Influenza-Unspecified 12/17/2016, 01/15/2018  . PFIZER(Purple Top)SARS-COV-2 Vaccination 02/24/2019, 03/17/2019, 10/30/2019, 05/21/2020  . Pneumococcal Conjugate-13 02/19/2015  . Pneumococcal Polysaccharide-23 09/17/2009  . Td 03/31/2001  . Tdap 10/13/2010, 04/10/2018  . Zoster Recombinat (Shingrix) 02/03/2017, 04/14/2017  . Zoster, Live 05/25/2012    {TDAP status:2101805}  {Flu Vaccine status:2101806}  {Pneumococcal vaccine status:2101807}  {Covid-19 vaccine status:2101808}  Qualifies for Shingles Vaccine? {YES/NO:21197}  Zostavax completed {YES/NO:21197}  {Shingrix Completed?:2101804}  Screening Tests Health Maintenance  Topic Date Due  . COVID-19 Vaccine (5 - 2023-24 season) 05/26/2022 (Originally 10/16/2021)  . COLONOSCOPY (Pts 45-31yrs Insurance coverage will need to be confirmed)  06/13/2022  . Medicare Annual Wellness (AWV)  05/11/2023  . DTaP/Tdap/Td (4 - Td or Tdap) 04/10/2028  . Pneumonia Vaccine 7+ Years old  Completed  . INFLUENZA VACCINE  Completed  . Hepatitis C Screening  Completed  . Zoster Vaccines- Shingrix  Completed  . HPV VACCINES  Aged Out  . Fecal DNA  (Cologuard)  Discontinued    Health Maintenance  There are no preventive care reminders to display for this patient.   {Colorectal cancer screening:2101809}  Lung Cancer Screening: (Low Dose CT Chest recommended if Age 25-80 years, 30 pack-year currently smoking OR have quit w/in 15years.) {DOES NOT does:27190::"does not"} qualify.   Lung Cancer Screening Referral: ***  Additional Screening:  Hepatitis C Screening: {DOES NOT does:27190::"does not"} qualify; Completed ***  Vision Screening: Recommended annual ophthalmology exams for early detection of glaucoma and other disorders of the eye. Is the patient up to date with their annual eye exam?  {YES/NO:21197} Who is the provider or what is the name of the office in which the patient attends annual eye exams? *** If pt is not established with a provider, would they like to be referred to a provider to establish care? {YES/NO:21197}.   Dental Screening: Recommended annual dental exams for proper oral hygiene  Community Resource Referral / Chronic Care Management: CRR required this visit?  {YES/NO:21197}  CCM required this visit?  {YES/NO:21197}     Plan:     I have personally reviewed and noted the following in the patient's chart:   Medical and social history Use of alcohol, tobacco or illicit drugs  Current medications and supplements including opioid prescriptions. {Opioid Prescriptions:[phone]} Functional ability and status Nutritional status Physical activity Advanced directives List of other physicians Hospitalizations, surgeries, and ER visits in previous 12 months Vitals Screenings to include cognitive, depression, and falls Referrals and appointments  In addition, I have reviewed and discussed with patient certain preventive protocols, quality metrics, and best practice recommendations. A written personalized care plan for preventive services as well as general preventive health recommendations were  provided to patient.     Lauree Chandler, NP   05/11/2022   Nurse Notes: ***

## 2022-05-11 NOTE — Telephone Encounter (Signed)
[name], Tim Walters are scheduled for a virtual visit with your provider today.    Just as we do with appointments in the office, we must obtain your consent to participate.  Your consent will be active for this visit and any virtual visit you may have with one of our providers in the next 365 days.    If you have a MyChart account, I can also send a copy of this consent to you electronically.  All virtual visits are billed to your insurance company just like a traditional visit in the office.  As this is a virtual visit, video technology does not allow for your provider to perform a traditional examination.  This may limit your provider's ability to fully assess your condition.  If your provider identifies any concerns that need to be evaluated in person or the need to arrange testing such as labs, EKG, etc, we will make arrangements to do so.    Although advances in technology are sophisticated, we cannot ensure that it will always work on either your end or our end.  If the connection with a video visit is poor, we may have to switch to a telephone visit.  With either a video or telephone visit, we are not always able to ensure that we have a secure connection.   I need to obtain your verbal consent now.   Are you willing to proceed with your visit today?   Tim Walters has provided verbal consent on 05/11/2022 for a virtual visit (video or telephone).   Leigh Aurora [place], [place] 05/11/2022  8:55 am

## 2022-05-12 ENCOUNTER — Ambulatory Visit (INDEPENDENT_AMBULATORY_CARE_PROVIDER_SITE_OTHER): Payer: Medicare Other | Admitting: Nurse Practitioner

## 2022-05-12 ENCOUNTER — Encounter: Payer: Self-pay | Admitting: Nurse Practitioner

## 2022-05-12 VITALS — BP 120/80 | HR 84 | Temp 97.4°F | Resp 16 | Ht 67.0 in | Wt 210.6 lb

## 2022-05-12 DIAGNOSIS — Z0000 Encounter for general adult medical examination without abnormal findings: Secondary | ICD-10-CM | POA: Diagnosis not present

## 2022-05-12 DIAGNOSIS — R911 Solitary pulmonary nodule: Secondary | ICD-10-CM | POA: Diagnosis not present

## 2022-05-12 NOTE — Progress Notes (Signed)
Subjective:   Tim Walters is a 77 y.o. male who presents for Medicare Annual/Subsequent preventive examination.  Review of Systems    Cardiac Risk Factors include: advanced age (>65men, >56 women);family history of premature cardiovascular disease;hypertension;dyslipidemia;obesity (BMI >30kg/m2)     Objective:    Today's Vitals   05/12/22 1436  BP: 120/80  Pulse: 84  Resp: 16  Temp: (!) 97.4 F (36.3 C)  SpO2: 97%  Weight: 210 lb 9.6 oz (95.5 kg)  Height: 5\' 7"  (1.702 m)   Body mass index is 32.98 kg/m.     05/12/2022    2:43 PM 05/11/2022    8:41 AM 05/10/2022    4:44 PM 09/02/2021    1:15 PM 05/11/2021    2:44 PM 05/05/2021    2:52 PM 11/10/2020   11:33 AM  Advanced Directives  Does Patient Have a Medical Advance Directive? Yes Yes Yes Yes Yes Yes Yes  Type of Advance Directive Out of facility DNR (pink MOST or yellow form) Out of facility DNR (pink MOST or yellow form) Out of facility DNR (pink MOST or yellow form) [place];Living will;Out of facility DNR (pink MOST or yellow form) Out of facility DNR (pink MOST or yellow form) Out of facility DNR (pink MOST or yellow form) Out of facility DNR (pink MOST or yellow form)  Does patient want to make changes to medical advance directive? No - Patient declined No - Patient declined No - Patient declined No - Patient declined No - Patient declined No - Patient declined No - Patient declined  Copy of [place] in Chart?    No - copy requested     Pre-existing out of facility DNR order (yellow form or pink MOST form)  Pink MOST form placed in chart (order not valid for inpatient use) Pink MOST form placed in chart (order not valid for inpatient use)  Pink MOST form placed in chart (order not valid for inpatient use) Pink MOST form placed in chart (order not valid for inpatient use) Pink MOST form placed in chart (order not valid for inpatient use)    Current Medications  (verified) Outpatient Encounter Medications as of 05/12/2022  Medication Sig   aspirin 81 MG tablet Take 1 tablet (81 mg total) by mouth daily.   atorvastatin (LIPITOR) 80 MG tablet TAKE 1 TABLET BY MOUTH ONCE  DAILY   carvedilol (COREG) 12.5 MG tablet TAKE 1 TABLET BY MOUTH TWICE  DAILY   hydrochlorothiazide (MICROZIDE) 12.5 MG capsule TAKE 1 CAPSULE BY MOUTH DAILY   ibuprofen (ADVIL) 200 MG tablet Take 200 mg by mouth every 6 (six) hours as needed.   Multiple Vitamin (MULTIVITAMIN WITH MINERALS) TABS tablet Take 1 tablet by mouth daily.   OMEPRAZOLE PO Take 20 mg by mouth every morning.    ramipril (ALTACE) 10 MG capsule TAKE 1 CAPSULE BY MOUTH DAILY   sertraline (ZOLOFT) 50 MG tablet TAKE 1 TABLET BY MOUTH  DAILY   No facility-administered encounter medications on file as of 05/12/2022.    Allergies (verified) Patient has no known allergies.   History: Past Medical History:  Diagnosis Date   Arthritis    lt ankle   Breast lump    Per records from [place]    Colon polyp    Coronary artery disease    a. BMS to RCA 2003 with residual LAD/diag disease treated medically, normal EF.   COVID    Depression    Diverticulosis  Per records from [place]    GERD (gastroesophageal reflux disease)    Hyperkalemia    a. K of 5.2 in 2017.   Hyperlipidemia    Hypertension    Idiopathic peripheral neuropathy    Per records from [place]    Impaired fasting glucose    Per records from [place]    Kidney stones    Monoclonal gammopathy of undetermined significance    Per records from [place]    Myocardial infarction [facility]) 11/15/2001   Dr. Daneen [facility])   OSA (obstructive sleep apnea)    Per [facility] New Patient Packet   Peripheral neuropathy    Left Foot, Per [place] New Patient Packet   Pre-diabetes    Sleep apnea    had test several yr ago-said he did not need a cpap-still snores   Torn rotator cuff    Per [facility] New Patient  Packet   Venous insufficiency of left leg    Per [facility] New Patient Packet   Wears glasses    Past Surgical History:  Procedure Laterality Date   ANGIOPLASTY  2003   Per records from [place]     left tricep   [place]   stent rca   CARDIOVASCULAR STRESS TEST  02/16/2016   Per records from [place]  06/17/2003   Per records from [place], Dr.Ganem to be repeated 2015   coronary artery stent  11/15/2001   CORONARY STENT PLACEMENT  11/15/2001   Per records from [place] Left 05/08/2013   Procedure: CYST REMOVAL BACK;  Surgeon: Adin Hector, MD;  Location: [place];  Service: General;  Laterality: Left;   INGUINAL HERNIA REPAIR Left 03/24/2015   Procedure: OPEN REPAIR LEFT INGUINAL HERNIA ;  Surgeon: Fanny Skates, MD;  Location: [place];  Service: General;  Laterality: Left;   INSERTION OF MESH Left 03/24/2015   Procedure: INSERTION OF MESH;  Surgeon: Fanny Skates, MD;  Location: [place];  Service: General;  Laterality: Left;   SHOULDER ARTHROSCOPY W/ ROTATOR CUFF REPAIR  2011   right   SPINE SURGERY  02/12/1993   L2, L3 fragmented disc   [place]  09/2018   Vein injections    Family History  Problem Relation Age of Onset   Macular degeneration Mother    COPD Mother    Non-Hodgkin's lymphoma Mother    Lung cancer Mother        Per [facility] New Patient Packet    Hypertension Father    Suicidality Father 66       Per [place] New Patient Packet    Depression Father    Angina Father        Per records from [place]    Diverticulosis Father        Per records from [place] disease Father    High Cholesterol Sister    Macular degeneration Maternal Grandmother    Schizophrenia Son    Bipolar disorder Son    Autism Son    Post-traumatic stress disorder Son     Diabetes type II Daughter    Breast cancer Neg Hx    Colon cancer Neg Hx    Esophageal cancer Neg Hx    Stomach cancer Neg Hx    Liver  disease Neg Hx    Pancreatic cancer Neg Hx    Social History   Socioeconomic History   Marital status: Married    Spouse name: Katharine Look   Number of children: 7   Years of education: 16   Highest education level: Bachelor's degree (e.g., BA, AB, BS)  Occupational History   Occupation: Retired  Tobacco Use   Smoking status: Former    Years: 12    Types: Cigarettes    Quit date: 02/15/1974    Years since quitting: 48.2   Smokeless tobacco: Never  Vaping Use   Vaping Use: Never used  Substance and Sexual Activity   Alcohol use: Yes    Alcohol/week: 14.0 standard drinks of alcohol    Types: 14 Standard drinks or equivalent per week    Comment: 14 drinks weekly in the evening    Drug use: No   Sexual activity: Not Currently  Other Topics Concern   Not on file  Social History Narrative   Lives with wife, Katharine Look   Caffeine use: Coffee daily   Caffeine free soda      As of 03/28/2018:   Diet: N/A      Caffeine: Yes      Married, if yes what year: Yes, 1971      Do you live in a house, apartment, assisted living, condo, trailer, ect: House, one stories, 2 persons      Pets: 1 cat      Current/Past profession: Dietitian, Chief Strategy Officer      Exercise: Yes, cardio strength 5 days weekly          Living Will: No   DNR: No, would like to discuss   POA/HPOA: Yes      Functional Status:   Do you have difficulty bathing or dressing yourself? No   Do you have difficulty preparing food or eating? No   Do you have difficulty managing your medications? No   Do you have difficulty managing your finances? No   Do you have difficulty affording your medications? No   Social Determinants of Health   Financial Resource Strain: Low Risk  (05/06/2022)   Overall Financial Resource Strain (CARDIA)    Difficulty of Paying Living Expenses: Not  hard at all  Food Insecurity: No Food Insecurity (05/06/2022)   Hunger Vital Sign    Worried About Running Out of Food in the Last Year: Never true    Ran Out of Food in the Last Year: Never true  Transportation Needs: No Transportation Needs (05/06/2022)   PRAPARE - Hydrologist (Medical): No    Lack of Transportation (Non-Medical): No  Physical Activity: Sufficiently Active (05/06/2022)   Exercise Vital Sign    Days of Exercise per Week: 4 days    Minutes of Exercise per Session: 90 min  Stress: No Stress Concern Present (05/06/2022)   [place]    Feeling of Stress : Not at all  Social Connections: Moderately Isolated (05/06/2022)   Social Connection and Isolation Panel [NHANES]    Frequency of Communication with Friends and Family: Once a week    Frequency of Social Gatherings with Friends and Family: More than three times a week    Attends Religious Services: Never    Marine scientist or Organizations: No    Attends Music therapist: Not on file    Marital Status: Married    Tobacco Counseling Counseling given:  Not Answered   Clinical Intake:  Pre-visit preparation completed: Yes  Pain : No/denies pain     BMI - recorded: 32 Nutritional Status: BMI > 30  Obese Nutritional Risks: None Diabetes: No  How often do you need to have someone help you when you read instructions, pamphlets, or other written materials from your doctor or pharmacy?: 1 - Never  Diabetic?no         Activities of Daily Living    05/12/2022    3:06 PM  In your present state of health, do you have any difficulty performing the following activities:  Hearing? 0  Vision? 0  Difficulty concentrating or making decisions? 0  Walking or climbing stairs? 0  Dressing or bathing? 0  Doing errands, shopping? 0  Preparing Food and eating ? N  Using the Toilet? N  In the past six  months, have you accidently leaked urine? N  Do you have problems with loss of bowel control? N    Patient Care Team: Lauree Chandler, NP as PCP - General (Geriatric Medicine) Belva Crome, MD (Inactive) as PCP - Cardiology (Cardiology) Fanny Skates, MD as Consulting Physician (General Surgery) Marchia Bond, MD as Consulting Physician (Orthopedic Surgery) Jarome Matin, MD as Consulting Physician (Dermatology) Belva Crome, MD (Inactive) as Consulting Physician (Cardiology) Kathrynn Ducking, MD (Inactive) as Consulting Physician (Neurology) Fonnie Mu, OD (Optometry) [facility]  Indicate any recent Medical Services you may have received from other than [facility] providers in the past year (date may be approximate).     Assessment:   This is a routine wellness examination for Jette.  Hearing/Vision screen Hearing Screening - Comments:: No hearing concerns. Vision Screening - Comments:: No vision concerns.   Dietary issues and exercise activities discussed: Current Exercise Habits: Home exercise routine;Structured exercise class, Type of exercise: strength training/weights, Time (Minutes): 45, Frequency (Times/Week): 3, Weekly Exercise (Minutes/Week): 135   Goals Addressed   None    Depression Screen    05/12/2022    2:38 PM 05/11/2022    8:40 AM 05/10/2022    2:59 PM 05/11/2021    2:44 PM 05/05/2021    2:49 PM 04/30/2020   10:00 AM 04/30/2019    1:06 PM  PHQ 2/9 Scores  PHQ - 2 Score 0 0 0 0 0 0 0  PHQ- 9 Score 0  3        Fall Risk    05/12/2022    2:37 PM 05/11/2022    8:40 AM 05/10/2022    2:59 PM 09/02/2021    1:15 PM 05/11/2021    2:44 PM  [place] in the past year? 0 0 0 0 0  Number falls in past yr: 0 0 0 0 0  Injury with Fall? 0 0 0 0 0  Risk for fall due to : No Fall Risks No Fall Risks No Fall Risks No Fall Risks No Fall Risks  Follow up Falls evaluation completed Falls evaluation completed  Falls evaluation completed Falls  evaluation completed    FALL RISK PREVENTION PERTAINING TO THE HOME:  Any stairs in or around the home? Yes  If so, are there any without handrails? No  Home free of loose throw rugs in walkways, pet beds, electrical cords, etc? Yes  Adequate lighting in your home to reduce risk of falls? Yes   ASSISTIVE DEVICES UTILIZED TO PREVENT FALLS:  Life alert? No  Use of a cane, walker or w/c?  No  Grab bars in the bathroom? Yes  Shower chair or bench in shower? Yes  Elevated toilet seat or a handicapped toilet? Yes   TIMED UP AND GO:  Was the test performed? No .    Cognitive Function:    05/12/2022    2:39 PM 04/26/2018    2:19 PM  MMSE - Mini Mental State Exam  Orientation to time 4 5  Orientation to Place 5 5  Registration 3 3  Attention/ Calculation 5   Recall 3 3  Language- name 2 objects 2 2  Language- repeat 1 1  Language- follow 3 step command 3 3  Language- read & follow direction 1 1  Write a sentence 1 1  Copy design 1 1  Total score 29         05/11/2022    8:42 AM 05/05/2021    2:53 PM 04/30/2020   10:03 AM 04/30/2019    1:07 PM  6CIT Screen  What Year? 0 points 0 points 0 points 0 points  What month? 0 points 0 points 0 points 0 points  What time? 0 points 0 points 0 points 0 points  Count back from 20 0 points 0 points 0 points 0 points  Months in reverse 0 points 0 points 0 points 0 points  Repeat phrase 0 points 0 points 0 points 0 points  Total Score 0 points 0 points 0 points 0 points    Immunizations Immunization History  Administered Date(s) Administered   Fluad Quad(high Dose 65+) 11/06/2018, 11/10/2020, 11/09/2021   Influenza, High Dose Seasonal PF 10/30/2019   Influenza, Seasonal, Injecte, Preservative Fre 02/15/2012   Influenza-Unspecified 12/17/2016, 01/15/2018   PFIZER(Purple Top)SARS-COV-2 Vaccination 02/24/2019, 03/17/2019, 10/30/2019, 05/21/2020   Pneumococcal Conjugate-13 02/19/2015   Pneumococcal Polysaccharide-23 09/17/2009   Td  03/31/2001   Tdap 10/13/2010, 04/10/2018   Zoster Recombinat (Shingrix) 02/03/2017, 04/14/2017   Zoster, Live 05/25/2012    TDAP status: Up to date  Flu Vaccine status: Up to date  Pneumococcal vaccine status: Up to date  Covid-19 vaccine status: Completed vaccines  Qualifies for Shingles Vaccine? Yes   Zostavax completed No   Shingrix Completed?: Yes  Screening Tests Health Maintenance  Topic Date Due   COVID-19 Vaccine (5 - 2023-24 season) 05/26/2022 (Originally 10/16/2021)   COLONOSCOPY (Pts 45-70yrs Insurance coverage will need to be confirmed)  06/13/2022   Medicare Annual Wellness (AWV)  05/12/2023   DTaP/Tdap/Td (4 - Td or Tdap) 04/10/2028   Pneumonia Vaccine 67+ Years old  Completed   INFLUENZA VACCINE  Completed   Hepatitis C Screening  Completed   Zoster Vaccines- Shingrix  Completed   HPV VACCINES  Aged Out   Fecal DNA (Cologuard)  Discontinued    Health Maintenance  There are no preventive care reminders to display for this patient.   Colorectal cancer screening: No longer required.   Lung Cancer Screening: (Low Dose CT Chest recommended if Age 15-80 years, 30 pack-year currently smoking OR have quit w/in 15years.) does not qualify.   Lung Cancer Screening Referral: na  Additional Screening:  Hepatitis C Screening: does qualify; Completed   Vision Screening: Recommended annual ophthalmology exams for early detection of glaucoma and other disorders of the eye. Is the patient up to date with their annual eye exam?  Yes  Who is the provider or what is the name of the office in which the patient attends annual eye exams? [place] [facility] If pt is not established with a provider, would they  like to be referred to a provider to establish care? No .   Dental Screening: Recommended annual dental exams for proper oral hygiene  Community Resource Referral / Chronic Care Management: CRR required this visit?  No   CCM required this visit?  No      Plan:      I have personally reviewed and noted the following in the patient's chart:   Medical and social history Use of alcohol, tobacco or illicit drugs  Current medications and supplements including opioid prescriptions. Patient is not currently taking opioid prescriptions. Functional ability and status Nutritional status Physical activity Advanced directives List of other physicians Hospitalizations, surgeries, and ER visits in previous 12 months Vitals Screenings to include cognitive, depression, and falls Referrals and appointments  In addition, I have reviewed and discussed with patient certain preventive protocols, quality metrics, and best practice recommendations. A written personalized care plan for preventive services as well as general preventive health recommendations were provided to patient.     Lauree Chandler, NP   05/12/2022   Place of service: [facility]

## 2022-05-12 NOTE — Patient Instructions (Signed)
Tim Walters , Thank you for taking time to come for your Medicare Wellness Visit. I appreciate your ongoing commitment to your health goals. Please review the following plan we discussed and let me know if I can assist you in the future.   Screening recommendations/referrals: Colonoscopy aged out Recommended yearly ophthalmology/optometry visit for glaucoma screening and checkup Recommended yearly dental visit for hygiene and checkup  Vaccinations: Influenza vaccine due annually in September/October Pneumococcal vaccine up to date Tdap vaccine up to date Shingles vaccine up to date    Advanced directives: on file.   Conditions/risks identified: advanced age.   Next appointment: yearly   Preventive Care 43 Years and Older, Male Preventive care refers to lifestyle choices and visits with your health care provider that can promote health and wellness. What does preventive care include? A yearly physical exam. This is also called an annual well check. Dental exams once or twice a year. Routine eye exams. Ask your health care provider how often you should have your eyes checked. Personal lifestyle choices, including: Daily care of your teeth and gums. Regular physical activity. Eating a healthy diet. Avoiding tobacco and drug use. Limiting alcohol use. Practicing safe sex. Taking low doses of aspirin every day. Taking vitamin and mineral supplements as recommended by your health care provider. What happens during an annual well check? The services and screenings done by your health care provider during your annual well check will depend on your age, overall health, lifestyle risk factors, and family history of disease. Counseling  Your health care provider may ask you questions about your: Alcohol use. Tobacco use. Drug use. Emotional well-being. Home and relationship well-being. Sexual activity. Eating habits. History of falls. Memory and ability to understand  (cognition). Work and work Statistician. Screening  You may have the following tests or measurements: Height, weight, and BMI. Blood pressure. Lipid and cholesterol levels. These may be checked every 5 years, or more frequently if you are over 62 years old. Skin check. Lung cancer screening. You may have this screening every year starting at age 16 if you have a 30-pack-year history of smoking and currently smoke or have quit within the past 15 years. Fecal occult blood test (FOBT) of the stool. You may have this test every year starting at age 30. Flexible sigmoidoscopy or colonoscopy. You may have a sigmoidoscopy every 5 years or a colonoscopy every 10 years starting at age 26. Prostate cancer screening. 77ecommendations will vary depending on your family history and other risks. Hepatitis C blood test. Hepatitis B blood test. Sexually transmitted disease (STD) testing. Diabetes screening. This is done by checking your blood sugar (glucose) after you have not eaten for a while (fasting). You may have this done every 1-3 years. Abdominal aortic aneurysm (AAA) screening. You may need this if you are a current or former smoker. Osteoporosis. You may be screened starting at age 29 if you are at high risk. Talk with your health care provider about your test results, treatment options, and if necessary, the need for more tests. Vaccines  Your health care provider may recommend certain vaccines, such as: Influenza vaccine. This is recommended every year. Tetanus, diphtheria, and acellular pertussis (Tdap, Td) vaccine. You may need a Td booster every 10 years. Zoster vaccine. You may need this after age 79. Pneumococcal 13-valent conjugate (PCV13) vaccine. One dose is recommended after age 79. Pneumococcal polysaccharide (PPSV23) vaccine. One dose is recommended after age 70. Talk to your health care provider about which screenings  and vaccines you need and how often you need them. This  information is not intended to replace advice given to you by your health care provider. Make sure you discuss any questions you have with your health care provider. Document Released: 02/28/2015 Document Revised: 10/22/2015 Document Reviewed: 12/03/2014 Elsevier Interactive Patient Education  2017 [place] Prevention in the Home Falls can cause injuries. They can happen to people of all ages. There are many things you can do to make your home safe and to help prevent falls. What can I do on the outside of my home? Regularly fix the edges of walkways and driveways and fix any cracks. Remove anything that might make you trip as you walk through a door, such as a raised step or threshold. Trim any bushes or trees on the path to your home. Use bright outdoor lighting. Clear any walking paths of anything that might make someone trip, such as rocks or tools. Regularly check to see if handrails are loose or broken. Make sure that both sides of any steps have handrails. Any raised decks and porches should have guardrails on the edges. Have any leaves, snow, or ice cleared regularly. Use sand or salt on walking paths during winter. Clean up any spills in your garage right away. This includes oil or grease spills. What can I do in the bathroom? Use night lights. Install grab bars by the toilet and in the tub and shower. Do not use towel bars as grab bars. Use non-skid mats or decals in the tub or shower. If you need to sit down in the shower, use a plastic, non-slip stool. Keep the floor dry. Clean up any water that spills on the floor as soon as it happens. Remove soap buildup in the tub or shower regularly. Attach bath mats securely with double-sided non-slip rug tape. Do not have throw rugs and other things on the floor that can make you trip. What can I do in the bedroom? Use night lights. Make sure that you have a light by your bed that is easy to reach. Do not use any sheets or  blankets that are too big for your bed. They should not hang down onto the floor. Have a firm chair that has side arms. You can use this for support while you get dressed. Do not have throw rugs and other things on the floor that can make you trip. What can I do in the kitchen? Clean up any spills right away. Avoid walking on wet floors. Keep items that you use a lot in easy-to-reach places. If you need to reach something above you, use a strong step stool that has a grab bar. Keep electrical cords out of the way. Do not use floor polish or wax that makes floors slippery. If you must use wax, use non-skid floor wax. Do not have throw rugs and other things on the floor that can make you trip. What can I do with my stairs? Do not leave any items on the stairs. Make sure that there are handrails on both sides of the stairs and use them. Fix handrails that are broken or loose. Make sure that handrails are as long as the stairways. Check any carpeting to make sure that it is firmly attached to the stairs. Fix any carpet that is loose or worn. Avoid having throw rugs at the top or bottom of the stairs. If you do have throw rugs, attach them to the floor with carpet tape.  Make sure that you have a light switch at the top of the stairs and the bottom of the stairs. If you do not have them, ask someone to add them for you. What else can I do to help prevent falls? Wear shoes that: Do not have high heels. Have rubber bottoms. Are comfortable and fit you well. Are closed at the toe. Do not wear sandals. If you use a stepladder: Make sure that it is fully opened. Do not climb a closed stepladder. Make sure that both sides of the stepladder are locked into place. Ask someone to hold it for you, if possible. Clearly mark and make sure that you can see: Any grab bars or handrails. First and last steps. Where the edge of each step is. Use tools that help you move around (mobility aids) if they are  needed. These include: Canes. Walkers. Scooters. Crutches. Turn on the lights when you go into a dark area. Replace any light bulbs as soon as they burn out. Set up your furniture so you have a clear path. Avoid moving your furniture around. If any of your floors are uneven, fix them. If there are any pets around you, be aware of where they are. Review your medicines with your doctor. Some medicines can make you feel dizzy. This can increase your chance of falling. Ask your doctor what other things that you can do to help prevent falls. This information is not intended to replace advice given to you by your health care provider. Make sure you discuss any questions you have with your health care provider. Document Released: 11/28/2008 Document Revised: 07/10/2015 Document Reviewed: 03/08/2014 Elsevier Interactive Patient Education  2017 [employer].

## 2022-05-12 NOTE — Addendum Note (Signed)
Addended by: Lauree Chandler on: 05/12/2022 03:14 PM   Modules accepted: Orders

## 2022-05-18 DIAGNOSIS — M25551 Pain in right hip: Secondary | ICD-10-CM | POA: Diagnosis not present

## 2022-05-18 DIAGNOSIS — M7061 Trochanteric bursitis, right hip: Secondary | ICD-10-CM | POA: Diagnosis not present

## 2022-05-26 ENCOUNTER — Ambulatory Visit (AMBULATORY_SURGERY_CENTER): Payer: Medicare Other

## 2022-05-26 ENCOUNTER — Encounter: Payer: Self-pay | Admitting: Gastroenterology

## 2022-05-26 VITALS — Ht 67.0 in | Wt 205.0 lb

## 2022-05-26 DIAGNOSIS — Z1211 Encounter for screening for malignant neoplasm of colon: Secondary | ICD-10-CM

## 2022-05-26 DIAGNOSIS — M25551 Pain in right hip: Secondary | ICD-10-CM | POA: Diagnosis not present

## 2022-05-26 DIAGNOSIS — M7061 Trochanteric bursitis, right hip: Secondary | ICD-10-CM | POA: Diagnosis not present

## 2022-05-26 MED ORDER — NA SULFATE-K SULFATE-MG SULF 17.5-3.13-1.6 GM/177ML PO SOLN
1.0000 | Freq: Once | ORAL | 0 refills | Status: AC
Start: 2022-05-26 — End: 2022-05-26

## 2022-05-26 NOTE — Progress Notes (Signed)
No egg or soy allergy known to patient  No issues known to pt with past sedation with any surgeries or procedures Patient denies ever being told they had issues or difficulty with intubation  No FH of Malignant Hyperthermia Pt is not on diet pills Pt is not on home 02  Pt is not on blood thinners  Pt denies issues with constipation  No A fib or A flutter Have any cardiac testing pending--NO Pt instructed to use Singlecare.com or GoodRx for a price reduction on prep   

## 2022-06-02 DIAGNOSIS — M25551 Pain in right hip: Secondary | ICD-10-CM | POA: Diagnosis not present

## 2022-06-02 DIAGNOSIS — M7061 Trochanteric bursitis, right hip: Secondary | ICD-10-CM | POA: Diagnosis not present

## 2022-06-07 ENCOUNTER — Ambulatory Visit: Payer: Medicare Other | Admitting: Nurse Practitioner

## 2022-06-09 DIAGNOSIS — M7061 Trochanteric bursitis, right hip: Secondary | ICD-10-CM | POA: Diagnosis not present

## 2022-06-09 DIAGNOSIS — M25551 Pain in right hip: Secondary | ICD-10-CM | POA: Diagnosis not present

## 2022-06-10 ENCOUNTER — Ambulatory Visit
Admission: RE | Admit: 2022-06-10 | Discharge: 2022-06-10 | Disposition: A | Discharge status: Home / Self Care | Payer: Medicare Other | Source: Ambulatory Visit | Attending: Nurse Practitioner | Admitting: Nurse Practitioner

## 2022-06-10 DIAGNOSIS — R911 Solitary pulmonary nodule: Secondary | ICD-10-CM | POA: Diagnosis not present

## 2022-06-13 ENCOUNTER — Encounter: Payer: Self-pay | Admitting: Certified Registered Nurse Anesthetist

## 2022-06-17 ENCOUNTER — Ambulatory Visit (AMBULATORY_SURGERY_CENTER): Payer: Medicare Other | Admitting: Gastroenterology

## 2022-06-17 ENCOUNTER — Encounter: Payer: Self-pay | Admitting: Gastroenterology

## 2022-06-17 VITALS — BP 133/76 | HR 57 | Temp 97.3°F | Resp 13 | Ht 67.0 in | Wt 205.0 lb

## 2022-06-17 DIAGNOSIS — Z86010 Personal history of colonic polyps: Secondary | ICD-10-CM | POA: Diagnosis not present

## 2022-06-17 DIAGNOSIS — Z09 Encounter for follow-up examination after completed treatment for conditions other than malignant neoplasm: Secondary | ICD-10-CM

## 2022-06-17 MED ORDER — SODIUM CHLORIDE 0.9 % IV SOLN
500.0000 mL | INTRAVENOUS | Status: DC
Start: 2022-06-17 — End: 2022-06-17

## 2022-06-17 NOTE — Patient Instructions (Signed)
Thank you for letting us take care of your healthcare needs today. Please see handouts given  to you on Diverticulosis, High Fiber Diet and Hemorrhoids.    YOU HAD AN ENDOSCOPIC PROCEDURE TODAY AT THE [facility]:   Refer to the procedure report that was given to you for any specific questions about what was found during the examination.  If the procedure report does not answer your questions, please call your gastroenterologist to clarify.  If you requested that your care partner not be given the details of your procedure findings, then the procedure report has been included in a sealed envelope for you to review at your convenience later.  YOU SHOULD EXPECT: Some feelings of bloating in the abdomen. Passage of more gas than usual.  Walking can help get rid of the air that was put into your GI tract during the procedure and reduce the bloating. If you had a lower endoscopy (such as a colonoscopy or flexible sigmoidoscopy) you may notice spotting of blood in your stool or on the toilet paper. If you underwent a bowel prep for your procedure, you may not have a normal bowel movement for a few days.  Please Note:  You might notice some irritation and congestion in your nose or some drainage.  This is from the oxygen used during your procedure.  There is no need for concern and it should clear up in a day or so.  SYMPTOMS TO REPORT IMMEDIATELY:  Following lower endoscopy (colonoscopy or flexible sigmoidoscopy):  Excessive amounts of blood in the stool  Significant tenderness or worsening of abdominal pains  Swelling of the abdomen that is new, acute  Fever of 100F or higher   For urgent or emergent issues, a gastroenterologist can be reached at any hour by calling [phone]. Do not use MyChart messaging for urgent concerns.    DIET:  We do recommend a small meal at first, but then you may proceed to your regular diet.  Drink plenty of fluids but you should avoid alcoholic  beverages for 24 hours.  ACTIVITY:  You should plan to take it easy for the rest of today and you should NOT DRIVE or use heavy machinery until tomorrow (because of the sedation medicines used during the test).    FOLLOW UP: Our staff will call the number listed on your records the next business day following your procedure.  We will call around 7:15- 8:00 am to check on you and address any questions or concerns that you may have regarding the information given to you following your procedure. If we do not reach you, we will leave a message.     If any biopsies were taken you will be contacted by phone or by letter within the next 1-3 weeks.  Please call us at [phone] if you have not heard about the biopsies in 3 weeks.    SIGNATURES/CONFIDENTIALITY: You and/or your care partner have signed paperwork which will be entered into your electronic medical record.  These signatures attest to the fact that that the information above on your After Visit Summary has been reviewed and is understood.  Full responsibility of the confidentiality of this discharge information lies with you and/or your care-partner.

## 2022-06-17 NOTE — Progress Notes (Signed)
History & Physical  Primary Care Physician:  [name], NP Primary Gastroenterologist: Claudette Head, MD  Impression / Plan:  Personal history of multiple adenomatous colon polyps for surveillance colonoscopy.  CHIEF COMPLAINT:  Personal history of colon polyps   HPI: Tim Walters is a 77 y.o. male with a personal history of multiple adenomatous colon polyps for surveillance colonoscopy.   Past Medical History:  Diagnosis Date   Arthritis    lt ankle   Breast lump    Per records from [facility]    Colon polyp    Coronary artery disease    a. BMS to RCA 2003 with residual LAD/diag disease treated medically, normal EF.   COVID    Depression    Diverticulosis    Per records from [facility]    GERD (gastroesophageal reflux disease)    Hyperkalemia    a. K of 5.2 in 2017.   Hyperlipidemia    Hypertension    Idiopathic peripheral neuropathy    Per records from [facility]    Impaired fasting glucose    Per records from [facility]    Kidney stones    Monoclonal gammopathy of undetermined significance    Per records from [facility]    Myocardial infarction [facility]) 11/15/2001   Dr. Verdis [facility])   OSA (obstructive sleep apnea)    Per [facility] New Patient Packet   Peripheral neuropathy    Left Foot, Per [facility] New Patient Packet   Pre-diabetes    Sleep apnea    had test several yr ago-said he did not need a cpap-still snores   Torn rotator cuff    Per [facility] New Patient Packet   Venous insufficiency of left leg    Per [facility] New Patient Packet   Wears glasses     Past Surgical History:  Procedure Laterality Date   ANGIOPLASTY  2003   Per records from [facility]    BASAL CELL CARCINOMA EXCISION     left tricep   CARDIAC CATHETERIZATION  23003   stent rca   CARDIOVASCULAR STRESS TEST  02/16/2016   Per records from [facility]    COLONOSCOPY  06/17/2003   Per records from [facility], Dr.Ganem to be  repeated 2015   coronary artery stent  11/15/2001   CORONARY STENT PLACEMENT  11/15/2001   Per records from [facility]    CYST REMOVAL TRUNK Left 05/08/2013   Procedure: CYST REMOVAL BACK;  Surgeon: Ernestene Mention, MD;  Location: [facility];  Service: General;  Laterality: Left;   INGUINAL HERNIA REPAIR Left 03/24/2015   Procedure: OPEN REPAIR LEFT INGUINAL HERNIA ;  Surgeon: Claud Kelp, MD;  Location: [facility];  Service: General;  Laterality: Left;   INSERTION OF MESH Left 03/24/2015   Procedure: INSERTION OF MESH;  Surgeon: Claud Kelp, MD;  Location: [facility];  Service: General;  Laterality: Left;   SHOULDER ARTHROSCOPY W/ ROTATOR CUFF REPAIR  2011   right   SPINE SURGERY  02/12/1993   L2, L3 fragmented disc   VASECTOMY  1987   VEIN REPAIR  09/2018   Vein injections     Prior to Admission medications   Medication Sig Start Date End Date Taking? Authorizing Provider  aspirin 81 MG tablet Take 1 tablet (81 mg total) by mouth daily. 06/11/14  Yes Lyn Records, MD  atorvastatin (LIPITOR) 80 MG tablet TAKE 1 TABLET BY MOUTH ONCE  DAILY 04/30/22  Yes [name], NP  carvedilol (COREG) 12.5 MG tablet TAKE 1 TABLET BY MOUTH TWICE  DAILY 10/27/21  Yes Lyn Records, MD  hydrochlorothiazide (MICROZIDE) 12.5 MG capsule TAKE 1 CAPSULE BY MOUTH DAILY 10/27/21  Yes Lyn Records, MD  Multiple Vitamin (MULTIVITAMIN WITH MINERALS) TABS tablet Take 1 tablet by mouth daily.   Yes [provider]  OMEPRAZOLE PO Take 20 mg by mouth every morning.    Yes [provider]  ramipril (ALTACE) 10 MG capsule TAKE 1 CAPSULE BY MOUTH DAILY 10/27/21  Yes Lyn Records, MD  sertraline (ZOLOFT) 50 MG tablet TAKE 1 TABLET BY MOUTH  DAILY 10/26/21  Yes [name], NP  ibuprofen (ADVIL) 200 MG tablet Take 200 mg by mouth every 6 (six) hours as needed.    [provider]    Current Outpatient Medications   Medication Sig Dispense Refill   aspirin 81 MG tablet Take 1 tablet (81 mg total) by mouth daily.     atorvastatin (LIPITOR) 80 MG tablet TAKE 1 TABLET BY MOUTH ONCE  DAILY 100 tablet 2   carvedilol (COREG) 12.5 MG tablet TAKE 1 TABLET BY MOUTH TWICE  DAILY 180 tablet 3   hydrochlorothiazide (MICROZIDE) 12.5 MG capsule TAKE 1 CAPSULE BY MOUTH DAILY 90 capsule 3   Multiple Vitamin (MULTIVITAMIN WITH MINERALS) TABS tablet Take 1 tablet by mouth daily.     OMEPRAZOLE PO Take 20 mg by mouth every morning.      ramipril (ALTACE) 10 MG capsule TAKE 1 CAPSULE BY MOUTH DAILY 90 capsule 3   sertraline (ZOLOFT) 50 MG tablet TAKE 1 TABLET BY MOUTH  DAILY 90 tablet 3   ibuprofen (ADVIL) 200 MG tablet Take 200 mg by mouth every 6 (six) hours as needed.     Current Facility-Administered Medications  Medication Dose Route Frequency Provider Last Rate Last Admin   0.9 %  sodium chloride infusion  500 mL Intravenous Continuous Meryl Dare, MD        Allergies as of 06/17/2022   (No Known Allergies)    Family History  Problem Relation Age of Onset   Macular degeneration Mother    COPD Mother    Non-Hodgkin's lymphoma Mother    Lung cancer Mother        Per [facility] New Patient Packet    Hypertension Father    Suicidality Father 10       Per [facility] New Patient Packet    Depression Father    Angina Father        Per records from [facility]    Diverticulosis Father        Per records from [facility]    Heart disease Father    High Cholesterol Sister    Macular degeneration Maternal Grandmother    Schizophrenia Son    Bipolar disorder Son    Autism Son    Post-traumatic stress disorder Son    Diabetes type II Daughter    Breast cancer Neg Hx    Colon cancer Neg Hx    Esophageal cancer Neg Hx    Stomach cancer Neg Hx    Liver disease Neg Hx    Pancreatic cancer Neg Hx     Social History   Socioeconomic History   Marital status: Married    Spouse name: Dois Davenport   Number of  children: 7   Years of education: 16   Highest education level: Bachelor's degree (e.g., BA,  AB, BS)  Occupational History   Occupation: Retired  Tobacco Use   Smoking status: Former    Years: 12    Types: Cigarettes    Quit date: 02/15/1974    Years since quitting: 48.3   Smokeless tobacco: Never  Vaping Use   Vaping Use: Never used  Substance and Sexual Activity   Alcohol use: Yes    Alcohol/week: 14.0 standard drinks of alcohol    Types: 14 Standard drinks or equivalent per week    Comment: 14 drinks weekly in the evening    Drug use: No   Sexual activity: Not Currently  Other Topics Concern   Not on file  Social History Narrative   Lives with wife, Dois Davenport   Caffeine use: Coffee daily   Caffeine free soda      As of 03/28/2018:   Diet: N/A      Caffeine: Yes      Married, if yes what year: Yes, 1971      Do you live in a house, apartment, assisted living, condo, trailer, ect: House, one stories, 2 persons      Pets: 1 cat      Current/Past profession: Oncologist, Surveyor, minerals      Exercise: Yes, cardio strength 5 days weekly          Living Will: No   DNR: No, would like to discuss   POA/HPOA: Yes      Functional Status:   Do you have difficulty bathing or dressing yourself? No   Do you have difficulty preparing food or eating? No   Do you have difficulty managing your medications? No   Do you have difficulty managing your finances? No   Do you have difficulty affording your medications? No   Social Determinants of Health   Financial Resource Strain: Low Risk  (05/06/2022)   Overall Financial Resource Strain (CARDIA)    Difficulty of Paying Living Expenses: Not hard at all  Food Insecurity: No Food Insecurity (05/06/2022)   Hunger Vital Sign    Worried About Running Out of Food in the Last Year: Never true    Ran Out of Food in the Last Year: Never true  Transportation Needs: No Transportation Needs (05/06/2022)   PRAPARE - Scientist, research (physical sciences) (Medical): No    Lack of Transportation (Non-Medical): No  Physical Activity: Sufficiently Active (05/06/2022)   Exercise Vital Sign    Days of Exercise per Week: 4 days    Minutes of Exercise per Session: 90 min  Stress: No Stress Concern Present (05/06/2022)   [employer] of Occupational Health - Occupational Stress Questionnaire    Feeling of Stress : Not at all  Social Connections: Moderately Isolated (05/06/2022)   Social Connection and Isolation Panel [NHANES]    Frequency of Communication with Friends and Family: Once a week    Frequency of Social Gatherings with Friends and Family: More than three times a week    Attends Religious Services: Never    Database administrator or Organizations: No    Attends Engineer, structural: Not on file    Marital Status: Married  Catering manager Violence: Not on file    Review of Systems:  All systems reviewed were negative except where noted in HPI.   Physical Exam: General:  Alert, well-developed, in NAD Head:  Normocephalic and atraumatic. Eyes:  Sclera clear, no icterus.   Conjunctiva pink. Ears:  Normal auditory acuity. Mouth:  No deformity  or lesions.  Neck:  Supple; no masses. Lungs:  Clear throughout to auscultation.   No wheezes, crackles, or rhonchi.  Heart:  Regular rate and rhythm; no murmurs. Abdomen:  Soft, nondistended, nontender. No masses, hepatomegaly. No palpable masses.  Normal bowel sounds.    Rectal:  Deferred   Msk:  Symmetrical without gross deformities. Extremities:  Without edema. Neurologic:  Alert and  oriented x 4; grossly normal neurologically. Skin:  Intact without significant lesions or rashes. Psych:  Alert and cooperative. Normal mood and affect.   Venita Lick. Russella Dar  06/17/2022, 2:31 PM See Loretha Stapler,  GI, to contact our on call provider

## 2022-06-17 NOTE — Progress Notes (Signed)
Report given to PACU, vss 

## 2022-06-17 NOTE — Op Note (Signed)
[facility] Patient Name: Tim Walters Procedure Date: 06/17/2022 2:24 PM MRN: [id number] Endoscopist: Meryl Dare , MD, [phone] Age: 77 Referring MD:  Date of Birth: March 21, 1945 Gender: Male Account #: [id number] Procedure:                Colonoscopy Indications:              Surveillance: Personal history of adenomatous                            polyps on last colonoscopy 3 years ago Medicines:                Monitored Anesthesia Care Procedure:                Pre-Anesthesia Assessment:                           - Prior to the procedure, a History and Physical                            was performed, and patient medications and                            allergies were reviewed. [name] tolerance of                            previous anesthesia was also reviewed. The risks                            and benefits of the procedure and the sedation                            options and risks were discussed with the patient.                            All questions were answered, and informed consent                            was obtained. Prior Anticoagulants: The patient has                            taken no anticoagulant or antiplatelet agents. ASA                            Grade Assessment: II - A patient with mild systemic                            disease. After reviewing the risks and benefits,                            the patient was deemed in satisfactory condition to                            undergo the procedure.  After obtaining informed consent, the colonoscope                            was passed under direct vision. Throughout the                            procedure, the patient's blood pressure, pulse, and                            oxygen saturations were monitored continuously. The                            CF [id number] #[id number] was introduced through the anus                            and advanced to the the  cecum, identified by the                            appendiceal orifice. The ileocecal valve,                            appendiceal orifice, and rectum were photographed.                            The quality of the bowel preparation was good. The                            colonoscopy was performed without difficulty. The                            patient tolerated the procedure well. Scope In: 2:33:49 PM Scope Out: 2:44:28 PM Scope Withdrawal Time: 0 hours 8 minutes 18 seconds  Total Procedure Duration: 0 hours 10 minutes 39 seconds  Findings:                 The perianal and digital rectal examinations were                            normal.                           A few small-mouthed diverticula were found in the                            right colon. There was no evidence of diverticular                            bleeding.                           Multiple medium-mouthed diverticula were found in                            the left colon. There was no evidence of  diverticular bleeding.                           Internal hemorrhoids were found during                            retroflexion. The hemorrhoids were small and Grade                            I (internal hemorrhoids that do not prolapse).                           The exam was otherwise without abnormality on                            direct and retroflexion views. Complications:            No immediate complications. Estimated blood loss:                            None. Estimated Blood Loss:     Estimated blood loss: none. Impression:               - Mild diverticulosis in the right colon.                           - Moderate diverticulosis in the left colon.                           - Small internal hemorrhoids.                           - The examination was otherwise normal on direct                            and retroflexion views.                           - No specimens  collected. Recommendation:           - Patient has a contact number available for                            emergencies. The signs and symptoms of potential                            delayed complications were discussed with the                            patient. Return to normal activities tomorrow.                            Written discharge instructions were provided to the                            patient.                           -  High fiber diet.                           - Continue present medications.                           - No repeat colonoscopy due to age and the absence                            of colonic polyps. Meryl Dare, MD 06/17/2022 2:52:23 PM This report has been signed electronically.

## 2022-06-17 NOTE — Progress Notes (Signed)
Pt's states no medical or surgical changes since previsit or office visit. 

## 2022-06-18 ENCOUNTER — Telehealth: Payer: Self-pay | Admitting: *Deleted

## 2022-06-18 NOTE — Telephone Encounter (Signed)
  Follow up Call-     06/17/2022    1:36 PM  Call back number  Post procedure Call Back phone  # [phone]  Permission to leave phone message Yes     Patient questions:  Do you have a fever, pain , or abdominal swelling? No. Pain Score  0 *  Have you tolerated food without any problems? Yes.    Have you been able to return to your normal activities? Yes.    Do you have any questions about your discharge instructions: Diet   No. Medications  No. Follow up visit  No.  Do you have questions or concerns about your Care? No.  Actions: * If pain score is 4 or above: No action needed, pain <4.

## 2022-06-23 NOTE — Progress Notes (Unsigned)
Office Visit    Patient Name: Tim Walters Date of Encounter: 06/23/2022  Primary Care Provider:  [name], NP Primary Cardiologist:  Lesleigh Noe, MD (Inactive) Primary Electrophysiologist: None   Past Medical History    Past Medical History:  Diagnosis Date   Arthritis    lt ankle   Breast lump    Per records from [facility]    Colon polyp    Coronary artery disease    a. BMS to RCA 2003 with residual LAD/diag disease treated medically, normal EF.   COVID    Depression    Diverticulosis    Per records from [facility]    GERD (gastroesophageal reflux disease)    Hyperkalemia    a. K of 5.2 in 2017.   Hyperlipidemia    Hypertension    Idiopathic peripheral neuropathy    Per records from [facility]    Impaired fasting glucose    Per records from [place]    Kidney stones    Monoclonal gammopathy of undetermined significance    Per records from [place] [facility]    Myocardial infarction [facility]) 11/15/2001   Dr. Verdis Prime [facility])   OSA (obstructive sleep apnea)    Per [facility] New Patient Packet   Peripheral neuropathy    Left Foot, Per [facility] New Patient Packet   Pre-diabetes    Sleep apnea    had test several yr ago-said he did not need a cpap-still snores   Torn rotator cuff    Per [facility] New Patient Packet   Venous insufficiency of left leg    Per [facility] New Patient Packet   Wears glasses    Past Surgical History:  Procedure Laterality Date   ANGIOPLASTY  2003   Per records from [facility]    BASAL CELL CARCINOMA EXCISION     left tricep   CARDIAC CATHETERIZATION  23003   stent rca   CARDIOVASCULAR STRESS TEST  02/16/2016   Per records from [place] [facility]    COLONOSCOPY  06/17/2003   Per records from [facility], Dr.Ganem to be repeated 2015   coronary artery stent  11/15/2001   CORONARY STENT PLACEMENT  11/15/2001   Per records from [facility]    CYST REMOVAL TRUNK Left 05/08/2013    Procedure: CYST REMOVAL BACK;  Surgeon: Ernestene Mention, MD;  Location: [facility];  Service: General;  Laterality: Left;   INGUINAL HERNIA REPAIR Left 03/24/2015   Procedure: OPEN REPAIR LEFT INGUINAL HERNIA ;  Surgeon: Claud Kelp, MD;  Location: [facility];  Service: General;  Laterality: Left;   INSERTION OF MESH Left 03/24/2015   Procedure: INSERTION OF MESH;  Surgeon: Claud Kelp, MD;  Location: [facility];  Service: General;  Laterality: Left;   SHOULDER ARTHROSCOPY W/ ROTATOR CUFF REPAIR  2011   right   SPINE SURGERY  02/12/1993   L2, L3 fragmented disc   VASECTOMY  1987   VEIN REPAIR  09/2018   Vein injections     Allergies  No Known Allergies   History of Present Illness    Tim Walters  is a 77 year old male with a PMH of CAD s/p acute inferior infarct 2003 treated with BMS to RCA, HTN, HLD, OSA (not on CPAP), GERD, MGUS, arthritis who presents today for follow-up of CAD.  Tim Walters was initially seen by Dr. [name] in 2003 when he presented with acute inferior STEMI  and was treated with BMS to RCA.  He was seen in 2017 and developed exertional chest pain and went a stress test that was considered low risk with small-moderate reversible inferior septal defect.  He was treated with medical therapy with no further changes made to his medications.  He developed some exertional SOB when seen in 08/2020 and underwent stress Myoview that showed mild abnormality and patient was continued on medical therapy at that time.  Tim Walters presents today for complaint of increased fatigue and shortness of breath.  He reports since his previous visit he is been experiencing increased fatigue that has limited some of his activity from day-to-day.  He denies any palpitations or skipped beats with this discomfort.  His blood pressure today is well-controlled at 122/72 and heart rate was 96 initially during his intake.  He is compliant with his  current medications and denies any adverse reactions.  He continues to stay active and walks to the [facility] for miles per day patient denies chest pain, palpitations, dyspnea, PND, orthopnea, nausea, vomiting, dizziness, syncope, edema, weight gain, or early satiety.   Home Medications    Current Outpatient Medications  Medication Sig Dispense Refill   aspirin 81 MG tablet Take 1 tablet (81 mg total) by mouth daily.     atorvastatin (LIPITOR) 80 MG tablet TAKE 1 TABLET BY MOUTH ONCE  DAILY 100 tablet 2   carvedilol (COREG) 12.5 MG tablet TAKE 1 TABLET BY MOUTH TWICE  DAILY 180 tablet 3   hydrochlorothiazide (MICROZIDE) 12.5 MG capsule TAKE 1 CAPSULE BY MOUTH DAILY 90 capsule 3   ibuprofen (ADVIL) 200 MG tablet Take 200 mg by mouth every 6 (six) hours as needed.     Multiple Vitamin (MULTIVITAMIN WITH MINERALS) TABS tablet Take 1 tablet by mouth daily.     OMEPRAZOLE PO Take 20 mg by mouth every morning.      ramipril (ALTACE) 10 MG capsule TAKE 1 CAPSULE BY MOUTH DAILY 90 capsule 3   sertraline (ZOLOFT) 50 MG tablet TAKE 1 TABLET BY MOUTH  DAILY 90 tablet 3   No current facility-administered medications for this visit.     Review of Systems  Please see the history of present illness.    (+) Fatigue  (+) Mild shortness of breath  All other systems reviewed and are otherwise negative except as noted above.  Physical Exam    Wt Readings from Last 3 Encounters:  06/17/22 205 lb (93 kg)  05/26/22 205 lb (93 kg)  05/12/22 210 lb 9.6 oz (95.5 kg)   KZ:SWFUX were no vitals filed for this visit.,There is no height or weight on file to calculate BMI.  Constitutional:      Appearance: Healthy appearance. Not in distress.  Neck:     Vascular: JVD normal.  Pulmonary:     Effort: Pulmonary effort is normal.     Breath sounds: No wheezing. No rales. Diminished in the bases Cardiovascular:     Normal rate. Regular rhythm. Normal S1. Normal S2.      Murmurs: There is no murmur.  Edema:     Peripheral edema absent.  Abdominal:     Palpations: Abdomen is soft non tender. There is no hepatomegaly.  Skin:    General: Skin is warm and dry.  Neurological:     General: No focal deficit present.     Mental Status: Alert and oriented to person, place and time.     Cranial Nerves: Cranial nerves are intact.  EKG/LABS/ Recent Cardiac Studies    ECG personally reviewed by me today -sinus rhythm with PACs and rate of 57 bpm with no acute changes.  Cardiac Studies & Procedures     STRESS TESTS  MYOCARDIAL PERFUSION IMAGING 09/15/2020  Narrative  Exercise stress test: CLinically negative, electrically positive for ischemia  Myoview scan shows normal perfusion with minimal thinning at apex No ischemia or scar.  Nuclear stress EF: 62%.  This is an intermediate risk study based on EKG findings              Risk Assessment/Calculations:            Lab Results  Component Value Date   WBC 5.6 05/05/2022   HGB 14.0 05/05/2022   HCT 41.9 05/05/2022   MCV 93.3 05/05/2022   PLT 171 05/05/2022   Lab Results  Component Value Date   CREATININE 1.00 05/05/2022   BUN 31 (H) 05/05/2022   NA 139 05/05/2022   K 4.2 05/05/2022   CL 105 05/05/2022   CO2 24 05/05/2022   Lab Results  Component Value Date   ALT 22 05/05/2022   AST 22 05/05/2022   ALKPHOS 56 10/02/2020   BILITOT 0.4 05/05/2022   Lab Results  Component Value Date   CHOL 135 05/05/2022   HDL 62 05/05/2022   LDLCALC 60 05/05/2022   TRIG 51 05/05/2022   CHOLHDL 2.2 05/05/2022    Lab Results  Component Value Date   HGBA1C 6.4 (H) 05/05/2022     Assessment & Plan    1.  Coronary artery disease: -s/p acute inferior MI in 2003 treated with BMS to RCA.  Patient underwent most recent ischemic evaluation in 09/2020 for complaint of shortness of breath that showed electrically positive for ischemia with normal perfusion and minimal thickening at the apex. -Today patient reports episodes of fatigue and  shortness of breath with exertion. -We will repeat myocardial perfusion scan to rule out possible ischemia related to shortness of breath and fatigue. -We will also repeat 2D echo to evaluate for structural or valvular heart changes. -Continue GDMT with ASA 81 mg, Lipitor 80 mg daily, carvedilol 6.25 mg daily  2.  Essential hypertension: -Patient's blood pressure today was well-controlled at 122/72 -Continue ramipril 10 mg daily, HCTZ 12.5 mg daily, carvedilol 6.25 mg daily  3.  Hyperlipidemia: -Patient's last LDL cholesterol was 60 -Continue Lipitor 80 mg daily  4.  History of OSA: -Patient currently not on CPAP and aware of importance of compliance  5.  Premature atrial contractions: -EKG completed today showing PACs which are frequent. -He also has complaint of fatigue and mild shortness of breath. -We will have him complete a 14-day ZIO to evaluate for sinus pauses or additional atrial arrhythmias.  Disposition: Follow-up with Lesleigh Noe, MD (Inactive) or APP in 2 months    Medication Adjustments/Labs and Tests Ordered: Current medicines are reviewed at length with the patient today.  Concerns regarding medicines are outlined above.   Signed, Napoleon Form, Leodis Rains, NP 06/23/2022, 1:05 PM [facility]

## 2022-06-24 ENCOUNTER — Other Ambulatory Visit: Payer: Self-pay | Admitting: Nurse Practitioner

## 2022-06-24 ENCOUNTER — Ambulatory Visit (INDEPENDENT_AMBULATORY_CARE_PROVIDER_SITE_OTHER): Payer: Medicare Other

## 2022-06-24 ENCOUNTER — Ambulatory Visit: Payer: Medicare Other | Attending: Nurse Practitioner | Admitting: Nurse Practitioner

## 2022-06-24 ENCOUNTER — Encounter: Payer: Self-pay | Admitting: Nurse Practitioner

## 2022-06-24 VITALS — BP 122/72 | HR 96 | Ht 67.0 in | Wt 205.2 lb

## 2022-06-24 DIAGNOSIS — I10 Essential (primary) hypertension: Secondary | ICD-10-CM

## 2022-06-24 DIAGNOSIS — E785 Hyperlipidemia, unspecified: Secondary | ICD-10-CM

## 2022-06-24 DIAGNOSIS — I25118 Atherosclerotic heart disease of native coronary artery with other forms of angina pectoris: Secondary | ICD-10-CM | POA: Diagnosis not present

## 2022-06-24 DIAGNOSIS — R002 Palpitations: Secondary | ICD-10-CM

## 2022-06-24 DIAGNOSIS — G4733 Obstructive sleep apnea (adult) (pediatric): Secondary | ICD-10-CM

## 2022-06-24 DIAGNOSIS — I491 Atrial premature depolarization: Secondary | ICD-10-CM

## 2022-06-24 MED ORDER — CARVEDILOL 6.25 MG PO TABS: 6.2500 mg | ORAL_TABLET | Freq: Two times a day (BID) | ORAL | 1 refills | Status: DC

## 2022-06-24 MED ORDER — CARVEDILOL 6.25 MG PO TABS: 12.5000 mg | ORAL_TABLET | Freq: Two times a day (BID) | ORAL | 3 refills | Status: DC

## 2022-06-24 NOTE — Addendum Note (Signed)
Addended by: Alveta Heimlich on: 06/24/2022 03:44 PM   Modules accepted: Orders

## 2022-06-24 NOTE — Patient Instructions (Addendum)
Medication Instructions:  DECREASE Coreg to 6.25mg  Take 1 tablet twice a day  *If you need a refill on your cardiac medications before your next appointment, please call your pharmacy*   Lab Work: None ordered   Testing/Procedures: Your physician has requested that you have an echocardiogram. Echocardiography is a painless test that uses sound waves to create images of your heart. It provides your doctor with information about the size and shape of your heart and how well your heart's chambers and valves are working. This procedure takes approximately one hour. There are no restrictions for this procedure. Please do NOT wear cologne, perfume, aftershave, or lotions (deodorant is allowed). Please arrive 15 minutes prior to your appointment time.  Your physician has requested that you have en exercise stress myoview. For further information please visit [email or web address] Please follow instruction sheet, as given.   Follow-Up: At [facility], you and your health needs are our priority.  As part of our continuing mission to provide you with exceptional heart care, we have created designated Provider Care Teams.  These Care Teams include your primary Cardiologist (physician) and Advanced Practice Providers (APPs -  Physician Assistants and Nurse Practitioners) who all work together to provide you with the care you need, when you need it.  We recommend signing up for the patient portal called "MyChart".  Sign up information is provided on this After Visit Summary.  MyChart is used to connect with patients for Virtual Visits (Telemedicine).  Patients are able to view lab/test results, encounter notes, upcoming appointments, etc.  Non-urgent messages can be sent to your provider as well.   To learn more about what you can do with MyChart, go to [email or web address].    Your next appointment:   2 month(s)  Provider:   Robin Searing, NP        Dr Laurance Flatten Dr Alverda Skeans Dr Riley Lam Other Instructions

## 2022-06-24 NOTE — Addendum Note (Signed)
Addended byDurenda Hurt on: 06/24/2022 03:39 PM   Modules accepted: Orders

## 2022-06-24 NOTE — Addendum Note (Signed)
Addended by: Elizabeth Palau on: 06/24/2022 02:14 PM   Modules accepted: Orders

## 2022-06-24 NOTE — Progress Notes (Unsigned)
Applied a 7 day Zio XT Monitor to patient in the office  Dr Anette Guarneri, MCh or HP to read

## 2022-06-25 ENCOUNTER — Ambulatory Visit (HOSPITAL_COMMUNITY): Payer: Medicare Other | Attending: Nurse Practitioner

## 2022-06-25 DIAGNOSIS — E785 Hyperlipidemia, unspecified: Secondary | ICD-10-CM | POA: Insufficient documentation

## 2022-06-25 DIAGNOSIS — I25118 Atherosclerotic heart disease of native coronary artery with other forms of angina pectoris: Secondary | ICD-10-CM | POA: Diagnosis not present

## 2022-06-25 DIAGNOSIS — G4733 Obstructive sleep apnea (adult) (pediatric): Secondary | ICD-10-CM

## 2022-06-25 DIAGNOSIS — I10 Essential (primary) hypertension: Secondary | ICD-10-CM | POA: Diagnosis not present

## 2022-06-25 LAB — MYOCARDIAL PERFUSION IMAGING
Estimated workload: 7
Exercise duration (min): 5 min
Exercise duration (sec): 59 s
LV dias vol: 65 mL (ref 62–150)
LV sys vol: 26 mL
MPHR: 142 {beats}/min
Nuc Stress EF: 61 %
Peak HR: 125 {beats}/min
Percent HR: 88 %
Rest HR: 59 {beats}/min
Rest Nuclear Isotope Dose: 10.3 mCi
SDS: 5
SRS: 1
SSS: 6
Stress Nuclear Isotope Dose: 32.6 mCi
TID: 0.97

## 2022-06-25 MED ORDER — TECHNETIUM TC 99M TETROFOSMIN IV KIT
32.6000 | PACK | Freq: Once | INTRAVENOUS | Status: AC | PRN
  Administered 2022-06-25: 32.6 via INTRAVENOUS

## 2022-06-25 MED ORDER — REGADENOSON 0.4 MG/5ML IV SOLN: 0.4000 mg | Freq: Once | INTRAVENOUS | Status: AC

## 2022-06-25 MED ORDER — TECHNETIUM TC 99M TETROFOSMIN IV KIT
10.3000 | PACK | Freq: Once | INTRAVENOUS | Status: AC | PRN
  Administered 2022-06-25: 10.3 via INTRAVENOUS

## 2022-07-06 DIAGNOSIS — I10 Essential (primary) hypertension: Secondary | ICD-10-CM | POA: Diagnosis not present

## 2022-07-06 DIAGNOSIS — I25118 Atherosclerotic heart disease of native coronary artery with other forms of angina pectoris: Secondary | ICD-10-CM | POA: Diagnosis not present

## 2022-07-07 ENCOUNTER — Other Ambulatory Visit: Payer: Self-pay

## 2022-07-07 MED ORDER — METOPROLOL TARTRATE 25 MG PO TABS: 25.0000 mg | ORAL_TABLET | Freq: Two times a day (BID) | ORAL | 1 refills | Status: DC

## 2022-07-28 ENCOUNTER — Ambulatory Visit (HOSPITAL_COMMUNITY): Payer: Medicare Other | Attending: Nurse Practitioner

## 2022-07-28 DIAGNOSIS — G4733 Obstructive sleep apnea (adult) (pediatric): Secondary | ICD-10-CM | POA: Insufficient documentation

## 2022-07-28 DIAGNOSIS — I25118 Atherosclerotic heart disease of native coronary artery with other forms of angina pectoris: Secondary | ICD-10-CM | POA: Diagnosis not present

## 2022-07-28 DIAGNOSIS — R0609 Other forms of dyspnea: Secondary | ICD-10-CM | POA: Diagnosis not present

## 2022-07-28 DIAGNOSIS — E785 Hyperlipidemia, unspecified: Secondary | ICD-10-CM | POA: Diagnosis not present

## 2022-07-28 DIAGNOSIS — I10 Essential (primary) hypertension: Secondary | ICD-10-CM | POA: Insufficient documentation

## 2022-07-28 LAB — ECHOCARDIOGRAM COMPLETE
Area-P 1/2: 2.74 cm2
S' Lateral: 2.5 cm

## 2022-07-30 ENCOUNTER — Other Ambulatory Visit: Payer: Self-pay

## 2022-07-30 MED ORDER — HYDROCHLOROTHIAZIDE 12.5 MG PO CAPS: 12.5000 mg | ORAL_CAPSULE | Freq: Every day | ORAL | 3 refills | Status: DC

## 2022-07-30 MED ORDER — RAMIPRIL 10 MG PO CAPS: 10.0000 mg | ORAL_CAPSULE | Freq: Every day | ORAL | 3 refills | Status: DC

## 2022-07-30 NOTE — Telephone Encounter (Signed)
Refill request received from [facility] [place] office.

## 2022-08-12 NOTE — Progress Notes (Signed)
Cardiology Office Note:    Date:  08/20/2022   ID:  Tim Walters, DOB 09/17/1945, MRN [id number]  PCP:  [name], NP   [facility] Providers Cardiologist:  Alverda Skeans, MD Referring MD: [name], NP   Chief Complaint/Reason for Referral: Cardiology follow-up  ASSESSMENT:    1. CAD in native artery   2. Essential hypertension, benign   3. Hyperlipidemia LDL goal <70   4. Aortic atherosclerosis (HCC)   5. BMI 32.0-32.9,adult   6. Fatigue, unspecified type     PLAN:    In order of problems listed above: 1.  Coronary artery disease: The patient has stable anginal.  It seems to recur during his first half mile of walking but then remits.  Continue current therapy.  Prescribed as needed nitroglycerin. 2.  Hypertension: Blood pressure is well-controlled on his current regimen. 3.  Hyperlipidemia: LDL was recently at goal will check LP(a) today. 4.  Aortic atherosclerosis: Continue aspirin, statin, and strict blood pressure control. 5.  Elevated BMI: His last hemoglobin A1c did not qualify him as a diabetic; continue diet and exercise. 6.  Fatigue: Check a reflex TSH.  I think this is likely due to weight gain.  We discussed strategies to lose weight including some lower meals frequent snacks, and waiting 3 minutes before having another helping during large meals.  I have asked him to perhaps participate in water aerobics given his hip issues.  He will give weight loss to try if this does not help we will consider a sleep apnea evaluation as well.            Dispo:  Return in about 6 months (around 02/20/2023).      Medication Adjustments/Labs and Tests Ordered: Current medicines are reviewed at length with the patient today.  Concerns regarding medicines are outlined above.  The following changes have been made:  no change   Labs/tests ordered: Orders Placed This Encounter  Procedures   Lipoprotein A (LPA)   TSH Rfx on Abnormal to Free T4     Medication Changes: No orders of the defined types were placed in this encounter.    Current medicines are reviewed at length with the patient today.  The patient does not have concerns regarding medicines.   History of Present Illness:    FOCUSED PROBLEM LIST:   1.  Coronary artery disease status post bare-metal stenting of the right coronary artery due to myocardial infarction 2003 with residual LAD and diagonal disease 2.  Hypertension 3.  Hyperlipidemia 4.  Prediabetes hemoglobin A1c is 6.19 April 2022 5.  Aortic atherosclerosis on chest CT 2024 6.  BMI of 32  The patient is a 77 y.o. male with the indicated medical history here for cardiology follow-up.  Initiation had been cared for by Dr. [name] for a long time.  He was seen in our clinic in May with increasing fatigue and shortness of breath.  He was referred for nuclear stress test which demonstrated low risk findings.  Echocardiogram demonstrated preserved LV function with no significant valvular abnormalities and aortic dilatation with a diameter of about 40 mm.  Monitor demonstrated asymptomatic SVT and the patient's Coreg was changed to metoprolol 25 mg twice daily.  His lipid panel in March showed an LDL of 60.  The patient tells me that he likes to walk.  About 8 months ago he was walking 3 to 4 miles a day.  Now he walks about a mile a day due  to bilateral bursitis.  He is participating in PT to help with this.  Interestingly he will develop chest tightness for the first half mile of his walk.  Then during the second half mile this seems to remit.  He has noticed that he has low energy and is fatigued a lot of the day.  He has gained about 10 pounds over the last couple of years due to his hip issues which have made him a less active.  His wife says he does not snore however his Apple watch demonstrates fragmented sleep.  He does have days when he wakes up where he feels not well rested.  He fortunately has not required any  emergency room visits or hospitalizations.  He is otherwise well without significant complaints.       Previous Medical History: Past Medical History:  Diagnosis Date   Arthritis    lt ankle   Breast lump    Per records from [facility]    Colon polyp    Coronary artery disease    a. BMS to RCA 2003 with residual LAD/diag disease treated medically, normal EF.   COVID    Depression    Diverticulosis    Per records from [facility]    GERD (gastroesophageal reflux disease)    Hyperkalemia    a. K of 5.2 in 2017.   Hyperlipidemia    Hypertension    Idiopathic peripheral neuropathy    Per records from [facility]    Impaired fasting glucose    Per records from [facility]    Kidney stones    Monoclonal gammopathy of undetermined significance    Per records from [facility]    Myocardial infarction [facility]) 11/15/2001   Dr. Verdis [facility])   OSA (obstructive sleep apnea)    Per [facility] New Patient Packet   Peripheral neuropathy    Left Foot, Per [facility] New Patient Packet   Pre-diabetes    Sleep apnea    had test several yr ago-said he did not need a cpap-still snores   Torn rotator cuff    Per [facility] New Patient Packet   Venous insufficiency of left leg    Per [facility] New Patient Packet   Wears glasses      Current Medications: Current Meds  Medication Sig   aspirin 81 MG tablet Take 1 tablet (81 mg total) by mouth daily.   atorvastatin (LIPITOR) 80 MG tablet TAKE 1 TABLET BY MOUTH ONCE  DAILY   hydrochlorothiazide (MICROZIDE) 12.5 MG capsule Take 1 capsule (12.5 mg total) by mouth daily.   metoprolol tartrate (LOPRESSOR) 25 MG tablet TAKE 1 TABLET BY MOUTH TWICE  DAILY   Multiple Vitamin (MULTIVITAMIN WITH MINERALS) TABS tablet Take 1 tablet by mouth daily.   OMEPRAZOLE PO Take 20 mg by mouth every morning.    ramipril (ALTACE) 10 MG capsule Take 1 capsule (10 mg total) by mouth daily.   sertraline (ZOLOFT) 50 MG tablet TAKE 1 TABLET BY  MOUTH  DAILY     Allergies:    Patient has no known allergies.   Social History:   Social History   Tobacco Use   Smoking status: Former    Years: 12    Types: Cigarettes    Quit date: 02/15/1974    Years since quitting: 48.5   Smokeless tobacco: Never  Vaping Use   Vaping Use: Never used  Substance Use Topics   Alcohol use: Yes    Alcohol/week: 14.0 standard  drinks of alcohol    Types: 14 Standard drinks or equivalent per week    Comment: 14 drinks weekly in the evening    Drug use: No     Family Hx: Family History  Problem Relation Age of Onset   Macular degeneration Mother    COPD Mother    Non-Hodgkin's lymphoma Mother    Lung cancer Mother        Per [facility] New Patient Packet    Hypertension Father    Suicidality Father 54       Per [facility] New Patient Packet    Depression Father    Angina Father        Per records from [facility]    Diverticulosis Father        Per records from [facility]    Heart disease Father    High Cholesterol Sister    Macular degeneration Maternal Grandmother    Schizophrenia Son    Bipolar disorder Son    Autism Son    Post-traumatic stress disorder Son    Diabetes type II Daughter    Breast cancer Neg Hx    Colon cancer Neg Hx    Esophageal cancer Neg Hx    Stomach cancer Neg Hx    Liver disease Neg Hx    Pancreatic cancer Neg Hx      Review of Systems:   Please see the history of present illness.    All other systems reviewed and are negative.     EKGs/Labs/Other Test Reviewed:    EKG: EKG performed May 2024 demonstrates sinus rhythm and PACs  EKG Interpretation Date/Time:    Ventricular Rate:    PR Interval:    QRS Duration:    QT Interval:    QTC Calculation:   R Axis:      Text Interpretation:           Prior CV studies:  Cardiac Studies & Procedures     STRESS TESTS  MYOCARDIAL PERFUSION IMAGING 06/25/2022  Narrative   Exercise stress:  Clinically positive, electrically positive for  ischemia.   Myoview scan with thinning with decreased tracer activity in the inferoseptal wall (base) consistent with probable soft tissue attenuation (diaphragm).   Prior study available for comparison from 09/15/2020.  No significant change   ECHOCARDIOGRAM  ECHOCARDIOGRAM COMPLETE 07/28/2022  Narrative ECHOCARDIOGRAM REPORT    Patient Name:   Tim Walters Date of Exam: 07/28/2022 Medical Rec #:  [id number]          Height:       67.0 in Accession #:    [id number]         Weight:       205.2 lb Date of Birth:  1945-09-29         BSA:          2.044 m Patient Age:    76 years           BP:           122/72 mmHg Patient Gender: M                  HR:           73 bpm. Exam Location:  [address]  Procedure: 2D Echo, 3D Echo, Cardiac Doppler and Color Doppler  Indications:    R06.00 Dyspnea  History:        Patient has no prior history of Echocardiogram examinations. Arrythmias:PAC, Signs/Symptoms:Chest Pain and Dyspnea; Risk  Factors:Sleep Apnea, Family History of Coronary Artery Disease, Hypertension, Dyslipidemia and Former Smoker.  Sonographer:    Farrel Conners RDCS Referring Phys: Ames Coupe DICK  IMPRESSIONS   1. Left ventricular ejection fraction, by estimation, is 60 to 65%. The left ventricle has normal function. The left ventricle has no regional wall motion abnormalities. Left ventricular diastolic function could not be evaluated. 2. Right ventricular systolic function is normal. The right ventricular size is normal. 3. The mitral valve is normal in structure. No evidence of mitral valve regurgitation. No evidence of mitral stenosis. 4. The aortic valve is normal in structure. Aortic valve regurgitation is trivial. No aortic stenosis is present. 5. Aortic dilatation noted. There is borderline dilatation of the ascending aorta, measuring 40 mm. 6. The inferior vena cava is normal in size with greater than 50% respiratory variability, suggesting right atrial  pressure of 3 mmHg.  FINDINGS Left Ventricle: Left ventricular ejection fraction, by estimation, is 60 to 65%. The left ventricle has normal function. The left ventricle has no regional wall motion abnormalities. The left ventricular internal cavity size was normal in size. There is no left ventricular hypertrophy. Left ventricular diastolic function could not be evaluated due to atrial fibrillation. Left ventricular diastolic function could not be evaluated.  Right Ventricle: The right ventricular size is normal. No increase in right ventricular wall thickness. Right ventricular systolic function is normal.  Left Atrium: Left atrial size was normal in size.  Right Atrium: Right atrial size was normal in size.  Pericardium: There is no evidence of pericardial effusion.  Mitral Valve: The mitral valve is normal in structure. No evidence of mitral valve regurgitation. No evidence of mitral valve stenosis.  Tricuspid Valve: The tricuspid valve is normal in structure. Tricuspid valve regurgitation is trivial. No evidence of tricuspid stenosis.  Aortic Valve: The aortic valve is normal in structure. Aortic valve regurgitation is trivial. No aortic stenosis is present.  Pulmonic Valve: The pulmonic valve was normal in structure. Pulmonic valve regurgitation is not visualized. No evidence of pulmonic stenosis.  Aorta: Aortic dilatation noted. There is borderline dilatation of the ascending aorta, measuring 40 mm.  Venous: The inferior vena cava is normal in size with greater than 50% respiratory variability, suggesting right atrial pressure of 3 mmHg.  IAS/Shunts: No atrial level shunt detected by color flow Doppler.   LEFT VENTRICLE PLAX 2D LVIDd:         4.00 cm   Diastology LVIDs:         2.50 cm   LV e' medial:    7.29 cm/s LV PW:         1.00 cm   LV E/e' medial:  10.6 LV IVS:        1.00 cm   LV e' lateral:   14.00 cm/s LVOT diam:     2.40 cm   LV E/e' lateral: 5.5 LV SV:          114 LV SV Index:   56 LVOT Area:     4.52 cm  3D Volume EF: 3D EF:        65 % LV EDV:       143 ml LV ESV:       50 ml LV SV:        92 ml  RIGHT VENTRICLE RV Basal diam:  3.70 cm RV S prime:     11.80 cm/s TAPSE (M-mode): 3.1 cm RVSP:  31.7 mmHg  LEFT ATRIUM             Index        RIGHT ATRIUM           Index LA diam:        4.60 cm 2.25 cm/m   RA Pressure: 3.00 mmHg LA Vol (A2C):   41.8 ml 20.45 ml/m  RA Area:     14.80 cm LA Vol (A4C):   63.9 ml 31.26 ml/m  RA Volume:   36.50 ml  17.85 ml/m LA Biplane Vol: 54.2 ml 26.51 ml/m AORTIC VALVE LVOT Vmax:   99.85 cm/s LVOT Vmean:  66.050 cm/s LVOT VTI:    0.252 m  AORTA Ao Root diam: 3.40 cm Ao Asc diam:  3.95 cm  MITRAL VALVE               TRICUSPID VALVE MV Area (PHT): cm         TR Peak grad:   28.7 mmHg MV Decel Time: 277 msec    TR Vmax:        268.00 cm/s MV E velocity: 77.15 cm/s  Estimated RAP:  3.00 mmHg MV A velocity: 89.15 cm/s  RVSP:           31.7 mmHg MV E/A ratio:  0.87 SHUNTS Systemic VTI:  0.25 m Systemic Diam: 2.40 cm  Aditya Sabharwal Electronically signed by Dorthula Nettles Signature Date/Time: 07/28/2022/2:41:59 PM    Final    MONITORS  LONG TERM MONITOR (3-14 DAYS) 07/06/2022  Narrative Patch Wear Time:  5 days and 5 hours (2024-05-09T12:18:37-0400 to 2024-05-14T17:19:32-0400)  Patient had a min HR of 45 bpm, max HR of 188 bpm, and avg HR of 72 bpm. Predominant underlying rhythm was Sinus Rhythm.  EVENTS: 12 Supraventricular Tachycardia runs occurred, the run with the fastest interval lasting 18 beats with a max rate of 188 bpm, the longest lasting 13.5 secs with an avg rate of 99 bpm.  Isolated SVEs were frequent (13.6%, 73362), SVE Couplets were rare (<1.0%, 849), and SVE Triplets were rare (<1.0%, 84).  Isolated VEs were rare (<1.0%), VE Couplets were rare (<1.0%), and no VE Triplets were present.  No atrial fibrillation, sustained ventricular tachyarrhythmias,  or bradyarrhythmias were detected.  Patient triggered events corresponded with sinus rhythm, PACs and PVCs.  A patient trigger was not associated with the SVT episodes.           Other studies Reviewed: Review of the additional studies/records demonstrates: Aortic atherosclerosis on chest CT 2024  Recent Labs: 05/05/2022: ALT 22; BUN 31; Creat 1.00; Hemoglobin 14.0; Platelets 171; Potassium 4.2; Sodium 139   Recent Lipid Panel Lab Results  Component Value Date/Time   CHOL 135 05/05/2022 08:15 AM   TRIG 51 05/05/2022 08:15 AM   HDL 62 05/05/2022 08:15 AM   LDLCALC 60 05/05/2022 08:15 AM    Risk Assessment/Calculations:                Physical Exam:    VS:  BP 135/80   Pulse 74   Ht 5\' 7"  (1.702 m)   Wt 204 lb 12.8 oz (92.9 kg)   SpO2 98%   BMI 32.08 kg/m    Wt Readings from Last 3 Encounters:  08/20/22 204 lb 12.8 oz (92.9 kg)  06/24/22 205 lb 3.2 oz (93.1 kg)  06/17/22 205 lb (93 kg)    GENERAL:  No apparent distress, AOx3 HEENT:  No carotid bruits, +2 carotid impulses, no scleral icterus  CAR: RRR no murmurs, gallops, rubs, or thrills RES:  Clear to auscultation bilaterally ABD:  Soft, nontender, nondistended, positive bowel sounds x 4 VASC:  +2 radial pulses, +2 carotid pulses, palpable pedal pulses NEURO:  CN 2-12 grossly intact; motor and sensory grossly intact PSYCH:  No active depression or anxiety EXT:  No edema, ecchymosis, or cyanosis  Signed, Orbie Pyo, MD  08/20/2022 3:56 PM    [facility] [address] [place], [place], [place]  [address] Phone: [phone]; Fax: [phone]   Note:  This document was prepared using Dragon voice recognition software and may include unintentional dictation errors.

## 2022-08-16 ENCOUNTER — Other Ambulatory Visit: Payer: Self-pay | Admitting: Nurse Practitioner

## 2022-08-20 ENCOUNTER — Encounter: Payer: Self-pay | Admitting: Internal Medicine

## 2022-08-20 ENCOUNTER — Ambulatory Visit: Payer: Medicare Other | Attending: Nurse Practitioner | Admitting: Internal Medicine

## 2022-08-20 VITALS — BP 135/80 | HR 74 | Ht 67.0 in | Wt 204.8 lb

## 2022-08-20 DIAGNOSIS — E785 Hyperlipidemia, unspecified: Secondary | ICD-10-CM | POA: Diagnosis not present

## 2022-08-20 DIAGNOSIS — I10 Essential (primary) hypertension: Secondary | ICD-10-CM

## 2022-08-20 DIAGNOSIS — I2510 Atherosclerotic heart disease of native coronary artery without angina pectoris: Secondary | ICD-10-CM | POA: Diagnosis not present

## 2022-08-20 DIAGNOSIS — Z6832 Body mass index (BMI) 32.0-32.9, adult: Secondary | ICD-10-CM

## 2022-08-20 DIAGNOSIS — I700 Atherosclerosis of aorta: Secondary | ICD-10-CM | POA: Diagnosis not present

## 2022-08-20 DIAGNOSIS — R5383 Other fatigue: Secondary | ICD-10-CM | POA: Diagnosis not present

## 2022-08-20 MED ORDER — NITROGLYCERIN 0.4 MG SL SUBL: 0.4000 mg | SUBLINGUAL_TABLET | SUBLINGUAL | 6 refills | Status: DC | PRN

## 2022-08-20 NOTE — Patient Instructions (Addendum)
Medication Instructions:  Your physician recommends that you continue on your current medications as directed. Please refer to the Current Medication list given to you today. A prescription for  SL nitroglycerin to use as needed has been [facility] to [facility] *If you need a refill on your cardiac medications before your next appointment, please call your pharmacy*   Lab Work: Lab work to be done today--Lipoprotein(a), reflex TSH If you have labs (blood work) drawn today and your tests are completely normal, you will receive your results only by: MyChart Message (if you have MyChart) OR A paper copy in the mail If you have any lab test that is abnormal or we need to change your treatment, we will call you to review the results.   Testing/Procedures: none   Follow-Up: At [facility], you and your health needs are our priority.  As part of our continuing mission to provide you with exceptional heart care, we have created designated Provider Care Teams.  These Care Teams include your primary Cardiologist (physician) and Advanced Practice Providers (APPs -  Physician Assistants and Nurse Practitioners) who all work together to provide you with the care you need, when you need it.  We recommend signing up for the patient portal called "MyChart".  Sign up information is provided on this After Visit Summary.  MyChart is used to connect with patients for Virtual Visits (Telemedicine).  Patients are able to view lab/test results, encounter notes, upcoming appointments, etc.  Non-urgent messages can be sent to your provider as well.   To learn more about what you can do with MyChart, go to [email or web address].    Your next appointment:   6 month(s)  Provider:   Orbie Pyo, MD     Other Instructions   Nitroglycerin Sublingual Tablets What is this medication? NITROGLYCERIN (nye troe GLI ser in) prevents and treats chest pain (angina). It works by relaxing blood vessels, which  decreases the amount of work the heart has to do. It belongs to a group of medications called nitrates. This medicine may be used for other purposes; ask your health care provider or pharmacist if you have questions. COMMON BRAND NAME(S): Nitroquick, Nitrostat, Nitrotab What should I tell my care team before I take this medication? They need to know if you have any of these conditions: Anemia Head injury, recent stroke, or bleeding in the brain Liver disease Previous heart attack An unusual or allergic reaction to nitroglycerin, other medications, foods, dyes, or preservatives Pregnant or trying to get pregnant Breast-feeding How should I use this medication? Take this medication by mouth as needed. Use at the first sign of an angina attack (chest pain or tightness). You can also take this medication 5 to 10 minutes before an event likely to produce chest pain. Follow the directions exactly as written on the prescription label. Place one tablet under your tongue and let it dissolve. Do not swallow whole. Replace the dose if you accidentally swallow it. It will help if your mouth is not dry. Saliva around the tablet will help it to dissolve more quickly. Do not eat or drink, smoke or chew tobacco while a tablet is dissolving. Sit down when taking this medication. In an angina attack, you should feel better within 5 minutes after your first dose. You can take a dose every 5 minutes up to a total of 3 doses. If you do not feel better or feel worse after 1 dose, call 9-1-1 at once. Do not take more  than 3 doses in 15 minutes. Your care team might give you other directions. Follow those directions if they do. Do not take your medication more often than directed. Talk to your care team about the use of this medication in children. Special care may be needed. Overdosage: If you think you have taken too much of this medicine contact a poison control center or emergency room at once. NOTE: This medicine is  only for you. Do not share this medicine with others. What if I miss a dose? This does not apply. This medication is only used as needed. What may interact with this medication? Do not take this medication with any of the following: Certain migraine medications, such as ergotamine and dihydroergotamine (DHE) Medications used to treat erectile dysfunction, such as sildenafil, tadalafil, and vardenafil Riociguat This medication may also interact with the following: Alteplase Aspirin Heparin Medications for blood pressure Medications for depression Other medications used to treat angina Phenothiazines, such as chlorpromazine, mesoridazine, prochlorperazine, thioridazine This list may not describe all possible interactions. Give your health care provider a list of all the medicines, herbs, non-prescription drugs, or dietary supplements you use. Also tell them if you smoke, drink alcohol, or use illegal drugs. Some items may interact with your medicine. What should I watch for while using this medication? Tell your care team if you feel your medication is no longer working. Keep this medication with you at all times. Sit or lie down when you take your medication to prevent falling if you feel dizzy or faint after using it. Try to remain calm. This will help you to feel better faster. If you feel dizzy, take several deep breaths and lie down with your feet propped up, or bend forward with your head resting between your knees. This medication may affect your coordination, reaction time, or judgment. Do not drive or operate machinery until you know how this medication affects you. Sit up or stand slowly to reduce the risk of dizzy or fainting spells. Drinking alcohol with this medication can increase the risk of these side effects. Do not treat yourself for coughs, colds, or pain while you are taking this medication without asking your care team for advice. Some ingredients may increase your blood  pressure. What side effects may I notice from receiving this medication? Side effects that you should report to your care team as soon as possible: Allergic reactions--skin rash, itching, hives, swelling of the face, lips, tongue, or throat Headache, unusual weakness or fatigue, shortness of breath, nausea, vomiting, rapid heartbeat, blue skin or lips, which may be signs of methemoglobinemia Increased pressure around the brain--severe headache, blurry vision, change in vision, nausea, vomiting Low blood pressure--dizziness, feeling faint or lightheaded, blurry vision Slow heartbeat--dizziness, feeling faint or lightheaded, confusion, trouble breathing, unusual weakness or fatigue Worsening chest pain (angina)--pain, pressure, or tightness in the chest, neck, back, or arms Side effects that usually do not require medical attention (report to your care team if they continue or are bothersome): Dizziness Flushing Headache This list may not describe all possible side effects. Call your doctor for medical advice about side effects. You may report side effects to FDA at 1-800-FDA-1088. Where should I keep my medication? Keep out of the reach of children. Store at room temperature between 20 and 25 degrees C (68 and 77 degrees F). Store in Retail buyer. Protect from light and moisture. Keep tightly closed. Throw away any unused medication after the expiration date. NOTE: This sheet is a summary. It  may not cover all possible information. If you have questions about this medicine, talk to your doctor, pharmacist, or health care provider.  2024 Elsevier/Gold Standard (2022-02-01 00:00:00)

## 2022-08-21 LAB — TSH RFX ON ABNORMAL TO FREE T4: TSH: 2.35 u[IU]/mL (ref 0.450–4.500)

## 2022-08-24 ENCOUNTER — Encounter: Payer: Self-pay | Admitting: Internal Medicine

## 2022-08-24 DIAGNOSIS — E785 Hyperlipidemia, unspecified: Secondary | ICD-10-CM

## 2022-08-24 LAB — LIPOPROTEIN A (LPA): Lipoprotein (a): 225.4 nmol/L — ABNORMAL HIGH (ref <75.0)

## 2022-08-25 ENCOUNTER — Ambulatory Visit: Payer: Medicare Other | Admitting: Nurse Practitioner

## 2022-08-25 MED ORDER — EZETIMIBE 10 MG PO TABS: 10.0000 mg | ORAL_TABLET | Freq: Every day | ORAL | 3 refills | Status: DC

## 2022-10-05 DIAGNOSIS — C44612 Basal cell carcinoma of skin of right upper limb, including shoulder: Secondary | ICD-10-CM | POA: Diagnosis not present

## 2022-10-05 DIAGNOSIS — Z85828 Personal history of other malignant neoplasm of skin: Secondary | ICD-10-CM | POA: Diagnosis not present

## 2022-10-05 DIAGNOSIS — D225 Melanocytic nevi of trunk: Secondary | ICD-10-CM | POA: Diagnosis not present

## 2022-10-05 DIAGNOSIS — L814 Other melanin hyperpigmentation: Secondary | ICD-10-CM | POA: Diagnosis not present

## 2022-10-05 DIAGNOSIS — L570 Actinic keratosis: Secondary | ICD-10-CM | POA: Diagnosis not present

## 2022-10-05 DIAGNOSIS — L821 Other seborrheic keratosis: Secondary | ICD-10-CM | POA: Diagnosis not present

## 2022-10-05 DIAGNOSIS — Z08 Encounter for follow-up examination after completed treatment for malignant neoplasm: Secondary | ICD-10-CM | POA: Diagnosis not present

## 2022-10-05 DIAGNOSIS — D485 Neoplasm of uncertain behavior of skin: Secondary | ICD-10-CM | POA: Diagnosis not present

## 2022-10-17 ENCOUNTER — Other Ambulatory Visit: Payer: Self-pay | Admitting: Nurse Practitioner

## 2022-10-26 ENCOUNTER — Ambulatory Visit: Payer: Medicare Other | Attending: Internal Medicine

## 2022-10-26 DIAGNOSIS — E785 Hyperlipidemia, unspecified: Secondary | ICD-10-CM

## 2022-10-27 LAB — LIPID PANEL
Chol/HDL Ratio: 1.8 ratio (ref 0.0–5.0)
Cholesterol, Total: 114 mg/dL (ref 100–199)
HDL: 63 mg/dL (ref >39)
LDL Chol Calc (NIH): 39 mg/dL (ref 0–99)
Triglycerides: 51 mg/dL (ref 0–149)
VLDL Cholesterol Cal: 12 mg/dL (ref 5–40)

## 2022-11-08 ENCOUNTER — Telehealth: Payer: Self-pay | Admitting: *Deleted

## 2022-11-08 DIAGNOSIS — I10 Essential (primary) hypertension: Secondary | ICD-10-CM

## 2022-11-08 DIAGNOSIS — R739 Hyperglycemia, unspecified: Secondary | ICD-10-CM

## 2022-11-08 NOTE — Telephone Encounter (Signed)
Orders placed.

## 2022-11-08 NOTE — Telephone Encounter (Signed)
Did not instruct him to come prior for blood work- can we confirm he wants to have the blood work before appt

## 2022-11-08 NOTE — Telephone Encounter (Signed)
Patient is scheduled for Labs tomorrow but no orders are in Epic.  Patient has a 6 Month OV with you for 9/27.  Please place lab orders for appointment.

## 2022-11-08 NOTE — Telephone Encounter (Signed)
Patient stated that he would like to have labs done to check his Blood Sugar.

## 2022-11-09 ENCOUNTER — Other Ambulatory Visit: Payer: Medicare Other

## 2022-11-09 DIAGNOSIS — R739 Hyperglycemia, unspecified: Secondary | ICD-10-CM

## 2022-11-09 DIAGNOSIS — I10 Essential (primary) hypertension: Secondary | ICD-10-CM | POA: Diagnosis not present

## 2022-11-10 DIAGNOSIS — C44612 Basal cell carcinoma of skin of right upper limb, including shoulder: Secondary | ICD-10-CM | POA: Diagnosis not present

## 2022-11-10 HISTORY — PX: BASAL CELL CARCINOMA EXCISION: SHX1214

## 2022-11-10 LAB — COMPLETE METABOLIC PANEL WITH GFR
AG Ratio: 1.8 (calc) (ref 1.0–2.5)
ALT: 26 U/L (ref 9–46)
AST: 22 U/L (ref 10–35)
Albumin: 4.2 g/dL (ref 3.6–5.1)
Alkaline phosphatase (APISO): 61 U/L (ref 35–144)
BUN: 21 mg/dL (ref 7–25)
CO2: 25 mmol/L (ref 20–32)
Calcium: 9.2 mg/dL (ref 8.6–10.3)
Chloride: 105 mmol/L (ref 98–110)
Creat: 0.94 mg/dL (ref 0.70–1.28)
Globulin: 2.4 g/dL (calc) (ref 1.9–3.7)
Glucose, Bld: 118 mg/dL — ABNORMAL HIGH (ref 65–99)
Potassium: 4.2 mmol/L (ref 3.5–5.3)
Sodium: 139 mmol/L (ref 135–146)
Total Bilirubin: 0.8 mg/dL (ref 0.2–1.2)
Total Protein: 6.6 g/dL (ref 6.1–8.1)
eGFR: 83 mL/min/{1.73_m2} (ref >=60)

## 2022-11-10 LAB — CBC WITH DIFFERENTIAL/PLATELET
Absolute Monocytes: 663 cells/uL (ref 200–950)
Basophils Absolute: 62 cells/uL (ref 0–200)
Basophils Relative: 1 %
Eosinophils Absolute: 180 cells/uL (ref 15–500)
Eosinophils Relative: 2.9 %
HCT: 43.6 % (ref 38.5–50.0)
Hemoglobin: 14.3 g/dL (ref 13.2–17.1)
Lymphs Abs: 1333 cells/uL (ref 850–3900)
MCH: 31.3 pg (ref 27.0–33.0)
MCHC: 32.8 g/dL (ref 32.0–36.0)
MCV: 95.4 fL (ref 80.0–100.0)
MPV: 10.1 fL (ref 7.5–12.5)
Monocytes Relative: 10.7 %
Neutro Abs: 3962 cells/uL (ref 1500–7800)
Neutrophils Relative %: 63.9 %
Platelets: 185 10*3/uL (ref 140–400)
RBC: 4.57 10*6/uL (ref 4.20–5.80)
RDW: 12.3 % (ref 11.0–15.0)
Total Lymphocyte: 21.5 %
WBC: 6.2 10*3/uL (ref 3.8–10.8)

## 2022-11-10 LAB — HEMOGLOBIN A1C
Hgb A1c MFr Bld: 6.3 % of total Hgb — ABNORMAL HIGH (ref <5.7)
Mean Plasma Glucose: 134 mg/dL
eAG (mmol/L): 7.4 mmol/L

## 2022-11-12 ENCOUNTER — Ambulatory Visit: Payer: Medicare Other | Admitting: Nurse Practitioner

## 2022-11-14 ENCOUNTER — Other Ambulatory Visit: Payer: Self-pay | Admitting: Internal Medicine

## 2022-11-19 ENCOUNTER — Encounter: Payer: Self-pay | Admitting: Nurse Practitioner

## 2022-11-19 ENCOUNTER — Ambulatory Visit (INDEPENDENT_AMBULATORY_CARE_PROVIDER_SITE_OTHER): Payer: Medicare Other | Admitting: Nurse Practitioner

## 2022-11-19 VITALS — BP 124/80 | HR 55 | Temp 97.1°F | Ht 67.0 in | Wt 212.2 lb

## 2022-11-19 DIAGNOSIS — I700 Atherosclerosis of aorta: Secondary | ICD-10-CM | POA: Diagnosis not present

## 2022-11-19 DIAGNOSIS — R739 Hyperglycemia, unspecified: Secondary | ICD-10-CM | POA: Diagnosis not present

## 2022-11-19 DIAGNOSIS — Z6831 Body mass index (BMI) 31.0-31.9, adult: Secondary | ICD-10-CM

## 2022-11-19 DIAGNOSIS — E785 Hyperlipidemia, unspecified: Secondary | ICD-10-CM

## 2022-11-19 DIAGNOSIS — E6609 Other obesity due to excess calories: Secondary | ICD-10-CM

## 2022-11-19 DIAGNOSIS — F325 Major depressive disorder, single episode, in full remission: Secondary | ICD-10-CM

## 2022-11-19 DIAGNOSIS — I10 Essential (primary) hypertension: Secondary | ICD-10-CM

## 2022-11-19 DIAGNOSIS — K219 Gastro-esophageal reflux disease without esophagitis: Secondary | ICD-10-CM | POA: Diagnosis not present

## 2022-11-19 DIAGNOSIS — M7061 Trochanteric bursitis, right hip: Secondary | ICD-10-CM

## 2022-11-19 DIAGNOSIS — E66811 Obesity, class 1: Secondary | ICD-10-CM

## 2022-11-19 NOTE — Progress Notes (Unsigned)
Careteam: Patient Care Team: [name], NP as PCP - General (Geriatric Medicine) Orbie Pyo, MD as PCP - Cardiology (Cardiology) Claud Kelp, MD as Consulting Physician (General Surgery) Teryl Lucy, MD as Consulting Physician (Orthopedic Surgery) Donzetta Starch, MD as Consulting Physician (Dermatology) York Spaniel, MD (Inactive) as Consulting Physician (Neurology) Golda Acre, OD (Optometry) [facility], [place] Orbie Pyo, MD as Consulting Physician (Cardiology)  PLACE OF SERVICE:  [facility]  Advanced Directive information Does Patient Have a Medical Advance Directive?: Yes, Type of Advance Directive: Out of facility DNR (pink MOST or yellow form), Pre-existing out of facility DNR order (yellow form or pink MOST form): Pink MOST form placed in chart (order not valid for inpatient use), Does patient want to make changes to medical advance directive?: No - Patient declined  No Known Allergies  Chief Complaint  Patient presents with   Medical Management of Chronic Issues    6 month follow-up.      HPI: Patient is a 77 y.o. male for routine follow up  Reports he is doing well. Overall more fatigue Question if this is from getting older Had an extensive work up by cardiology- recently started on metoprolol  Continues to exercise but instead of walking 4 miles he walks 4 Still goes to gym.  PT helped bursitis in his hip.   Sometimes he gets to thinking about his age and wondering how many good years he has left, which will get him down but overall mood is good.   Tries to maintain low sugar diet but A1c has not improved much.   Review of Systems:  Review of Systems  Constitutional:  Negative for chills, fever and weight loss.  HENT:  Negative for tinnitus.   Respiratory:  Negative for cough, sputum production and shortness of breath.   Cardiovascular:  Negative for chest pain, palpitations and leg swelling.  Gastrointestinal:   Negative for abdominal pain, constipation, diarrhea and heartburn.  Genitourinary:  Negative for dysuria, frequency and urgency.  Musculoskeletal:  Negative for back pain, falls, joint pain and myalgias.  Skin: Negative.   Neurological:  Negative for dizziness and headaches.  Psychiatric/Behavioral:  Negative for depression and memory loss. The patient does not have insomnia.     Past Medical History:  Diagnosis Date   Arthritis    lt ankle   Breast lump    Per records from [facility]    Colon polyp    Coronary artery disease    a. BMS to RCA 2003 with residual LAD/diag disease treated medically, normal EF.   COVID    Depression    Diverticulosis    Per records from [facility]    GERD (gastroesophageal reflux disease)    Hyperkalemia    a. K of 5.2 in 2017.   Hyperlipidemia    Hypertension    Idiopathic peripheral neuropathy    Per records from [facility]    Impaired fasting glucose    Per records from [facility]    Kidney stones    Monoclonal gammopathy of undetermined significance    Per records from [facility]    Myocardial infarction [facility]) 11/15/2001   Dr. Verdis Prime [facility])   OSA (obstructive sleep apnea)    Per [facility] New Patient Packet   Peripheral neuropathy    Left Foot, Per [facility] New Patient Packet   Pre-diabetes    Sleep apnea    had test several yr ago-said he did  not need a cpap-still snores   Torn rotator cuff    Per [facility] New Patient Packet   Venous insufficiency of left leg    Per [facility] New Patient Packet   Wears glasses    Past Surgical History:  Procedure Laterality Date   ANGIOPLASTY  2003   Per records from [facility]    BASAL CELL CARCINOMA EXCISION     left tricep   BASAL CELL CARCINOMA EXCISION  11/10/2022   right shoulder   CARDIAC CATHETERIZATION  23003   stent rca   CARDIOVASCULAR STRESS TEST  02/16/2016   Per records from [facility]    COLONOSCOPY  06/17/2003   Per records from [facility], Dr.Ganem to be repeated 2015   coronary artery stent  11/15/2001   CORONARY STENT PLACEMENT  11/15/2001   Per records from [facility]    CYST REMOVAL TRUNK Left 05/08/2013   Procedure: CYST REMOVAL BACK;  Surgeon: Ernestene Mention, MD;  Location: [facility];  Service: General;  Laterality: Left;   INGUINAL HERNIA REPAIR Left 03/24/2015   Procedure: OPEN REPAIR LEFT INGUINAL HERNIA ;  Surgeon: Claud Kelp, MD;  Location: [facility];  Service: General;  Laterality: Left;   INSERTION OF MESH Left 03/24/2015   Procedure: INSERTION OF MESH;  Surgeon: Claud Kelp, MD;  Location:  SURGERY CENTER;  Service: General;  Laterality: Left;   SHOULDER ARTHROSCOPY W/ ROTATOR CUFF REPAIR  2011   right   SPINE SURGERY  02/12/1993   L2, L3 fragmented disc   VASECTOMY  1987   VEIN REPAIR  09/2018   Vein injections    Social History:   reports that he quit smoking about 48 years ago. His smoking use included cigarettes. He started smoking about 60 years ago. He has never used smokeless tobacco. He reports current alcohol use of about 14.0 standard drinks of alcohol per week. He reports that he does not use drugs.  Family History  Problem Relation Age of Onset   Macular degeneration Mother    COPD Mother    Non-Hodgkin's lymphoma Mother    Lung cancer Mother        Per [facility] New Patient Packet    Hypertension Father    Suicidality Father 77       Per [facility] New Patient Packet    Depression Father    Angina Father        Per records from [facility]    Diverticulosis Father        Per records from [facility]    Heart disease Father    High Cholesterol Sister    Macular degeneration Maternal Grandmother    Schizophrenia Son    Bipolar disorder Son    Autism Son    Post-traumatic stress disorder Son    Diabetes type II Daughter    Breast cancer Neg Hx    Colon cancer Neg Hx    Esophageal cancer Neg Hx    Stomach cancer Neg  Hx    Liver disease Neg Hx    Pancreatic cancer Neg Hx     Medications: Patient's Medications  New Prescriptions   No medications on file  Previous Medications   ASPIRIN 81 MG TABLET    Take 1 tablet (81 mg total) by mouth daily.   ATORVASTATIN (LIPITOR) 80 MG TABLET    TAKE 1 TABLET BY MOUTH ONCE  DAILY   EZETIMIBE (ZETIA) 10 MG TABLET  Take 1 tablet (10 mg total) by mouth daily.   HYDROCHLOROTHIAZIDE (MICROZIDE) 12.5 MG CAPSULE    Take 1 capsule (12.5 mg total) by mouth daily.   METOPROLOL TARTRATE (LOPRESSOR) 25 MG TABLET    TAKE 1 TABLET BY MOUTH TWICE  DAILY   MULTIPLE VITAMIN (MULTIVITAMIN WITH MINERALS) TABS TABLET    Take 1 tablet by mouth daily.   NITROGLYCERIN (NITROSTAT) 0.4 MG SL TABLET    Place 1 tablet (0.4 mg total) under the tongue every 5 (five) minutes as needed for chest pain.   OMEPRAZOLE PO    Take 20 mg by mouth every morning.    RAMIPRIL (ALTACE) 10 MG CAPSULE    Take 1 capsule (10 mg total) by mouth daily.   SERTRALINE (ZOLOFT) 50 MG TABLET    TAKE 1 TABLET BY MOUTH DAILY  Modified Medications   No medications on file  Discontinued Medications   No medications on file    Physical Exam:  Vitals:   11/18/22 1538  BP: 124/80  Pulse: (!) 55  Temp: (!) 97.1 F (36.2 C)  TempSrc: Temporal  SpO2: 96%  Weight: 212 lb 3.2 oz (96.3 kg)  Height: 5\' 7"  (1.702 m)   Body mass index is 33.24 kg/m. Wt Readings from Last 3 Encounters:  11/18/22 212 lb 3.2 oz (96.3 kg)  08/20/22 204 lb 12.8 oz (92.9 kg)  06/24/22 205 lb 3.2 oz (93.1 kg)    Physical Exam Constitutional:      General: He is not in acute distress.    Appearance: He is well-developed. He is not diaphoretic.  HENT:     Head: Normocephalic and atraumatic.     Right Ear: External ear normal.     Left Ear: External ear normal.     Mouth/Throat:     Pharynx: No oropharyngeal exudate.  Eyes:     Conjunctiva/sclera: Conjunctivae normal.     Pupils: Pupils are equal, round, and reactive to  light.  Cardiovascular:     Rate and Rhythm: Normal rate and regular rhythm.     Heart sounds: Normal heart sounds.  Pulmonary:     Effort: Pulmonary effort is normal.     Breath sounds: Normal breath sounds.  Abdominal:     General: Bowel sounds are normal.     Palpations: Abdomen is soft.  Musculoskeletal:        General: No tenderness.     Cervical back: Normal range of motion and neck supple.     Right lower leg: No edema.     Left lower leg: No edema.  Skin:    General: Skin is warm and dry.  Neurological:     Mental Status: He is alert and oriented to person, place, and time.     Labs reviewed: Basic Metabolic Panel: Recent Labs    05/05/22 0815 08/20/22 1603 11/09/22 0826  NA 139  --  139  K 4.2  --  4.2  CL 105  --  105  CO2 24  --  25  GLUCOSE 103*  --  118*  BUN 31*  --  21  CREATININE 1.00  --  0.94  CALCIUM 9.4  --  9.2  TSH  --  2.350  --    Liver Function Tests: Recent Labs    05/05/22 0815 11/09/22 0826  AST 22 22  ALT 22 26  BILITOT 0.4 0.8  PROT 6.8 6.6   No results for input(s): "LIPASE", "AMYLASE" in the last 8760 hours.  No results for input(s): "AMMONIA" in the last 8760 hours. CBC: Recent Labs    05/05/22 0815 11/09/22 0826  WBC 5.6 6.2  NEUTROABS 3,567 3,962  HGB 14.0 14.3  HCT 41.9 43.6  MCV 93.3 95.4  PLT 171 185   Lipid Panel: Recent Labs    05/05/22 0815 10/26/22 1009  CHOL 135 114  HDL 62 63  LDLCALC 60 39  TRIG 51 51  CHOLHDL 2.2 1.8   TSH: Recent Labs    08/20/22 1603  TSH 2.350   A1C: Lab Results  Component Value Date   HGBA1C 6.3 (H) 11/09/2022     Assessment/Plan 1. Hyperglycemia -continue to work on dietary modification  Continue exercise - Hemoglobin A1c; Future  2. Essential hypertension, benign -Blood pressure well controlled, goal bp <140/90 Continue current medications and dietary modifications follow metabolic panel - COMPLETE METABOLIC PANEL WITH GFR; Future - CBC with  Differential/Platelet; Future  3. Major depressive disorder in full remission, unspecified whether recurrent (HCC) -stable on zoloft and lifestyle   4. Gastroesophageal reflux disease without esophagitis -controlled on dietary modifications with omeprazole.   5. Aortic atherosclerosis (HCC) -continues asa and statin - Lipid panel; Future  6. Class 1 obesity due to excess calories with serious comorbidity and body mass index (BMI) of 31.0 to 31.9 in adult -education provided on healthy weight loss through increase in physical activity and proper nutrition   7. Trochanteric bursitis of right hip Improved with PT, continue exercises  8. Hyperlipidemia, unspecified hyperlipidemia type -LDL at goal on zetia and lipitor with dietary modifications.  - Lipid panel; Future  Return in about 6 months (around 05/20/2023) for routine fllow up, labs prior to appt.  Janene Harvey. Biagio Borg [facility] [phone]

## 2022-12-06 DIAGNOSIS — E119 Type 2 diabetes mellitus without complications: Secondary | ICD-10-CM | POA: Diagnosis not present

## 2023-01-22 ENCOUNTER — Other Ambulatory Visit: Payer: Self-pay | Admitting: Nurse Practitioner

## 2023-03-18 IMAGING — CR DG CHEST 1V PORT
1 series · 1 of 1 positions shown · non-contrast
Comparison: None.

CLINICAL DATA: Back pain and lightheadedness.  Concern for ACS.

EXAM:
PORTABLE CHEST 1 VIEW

[chest pa]
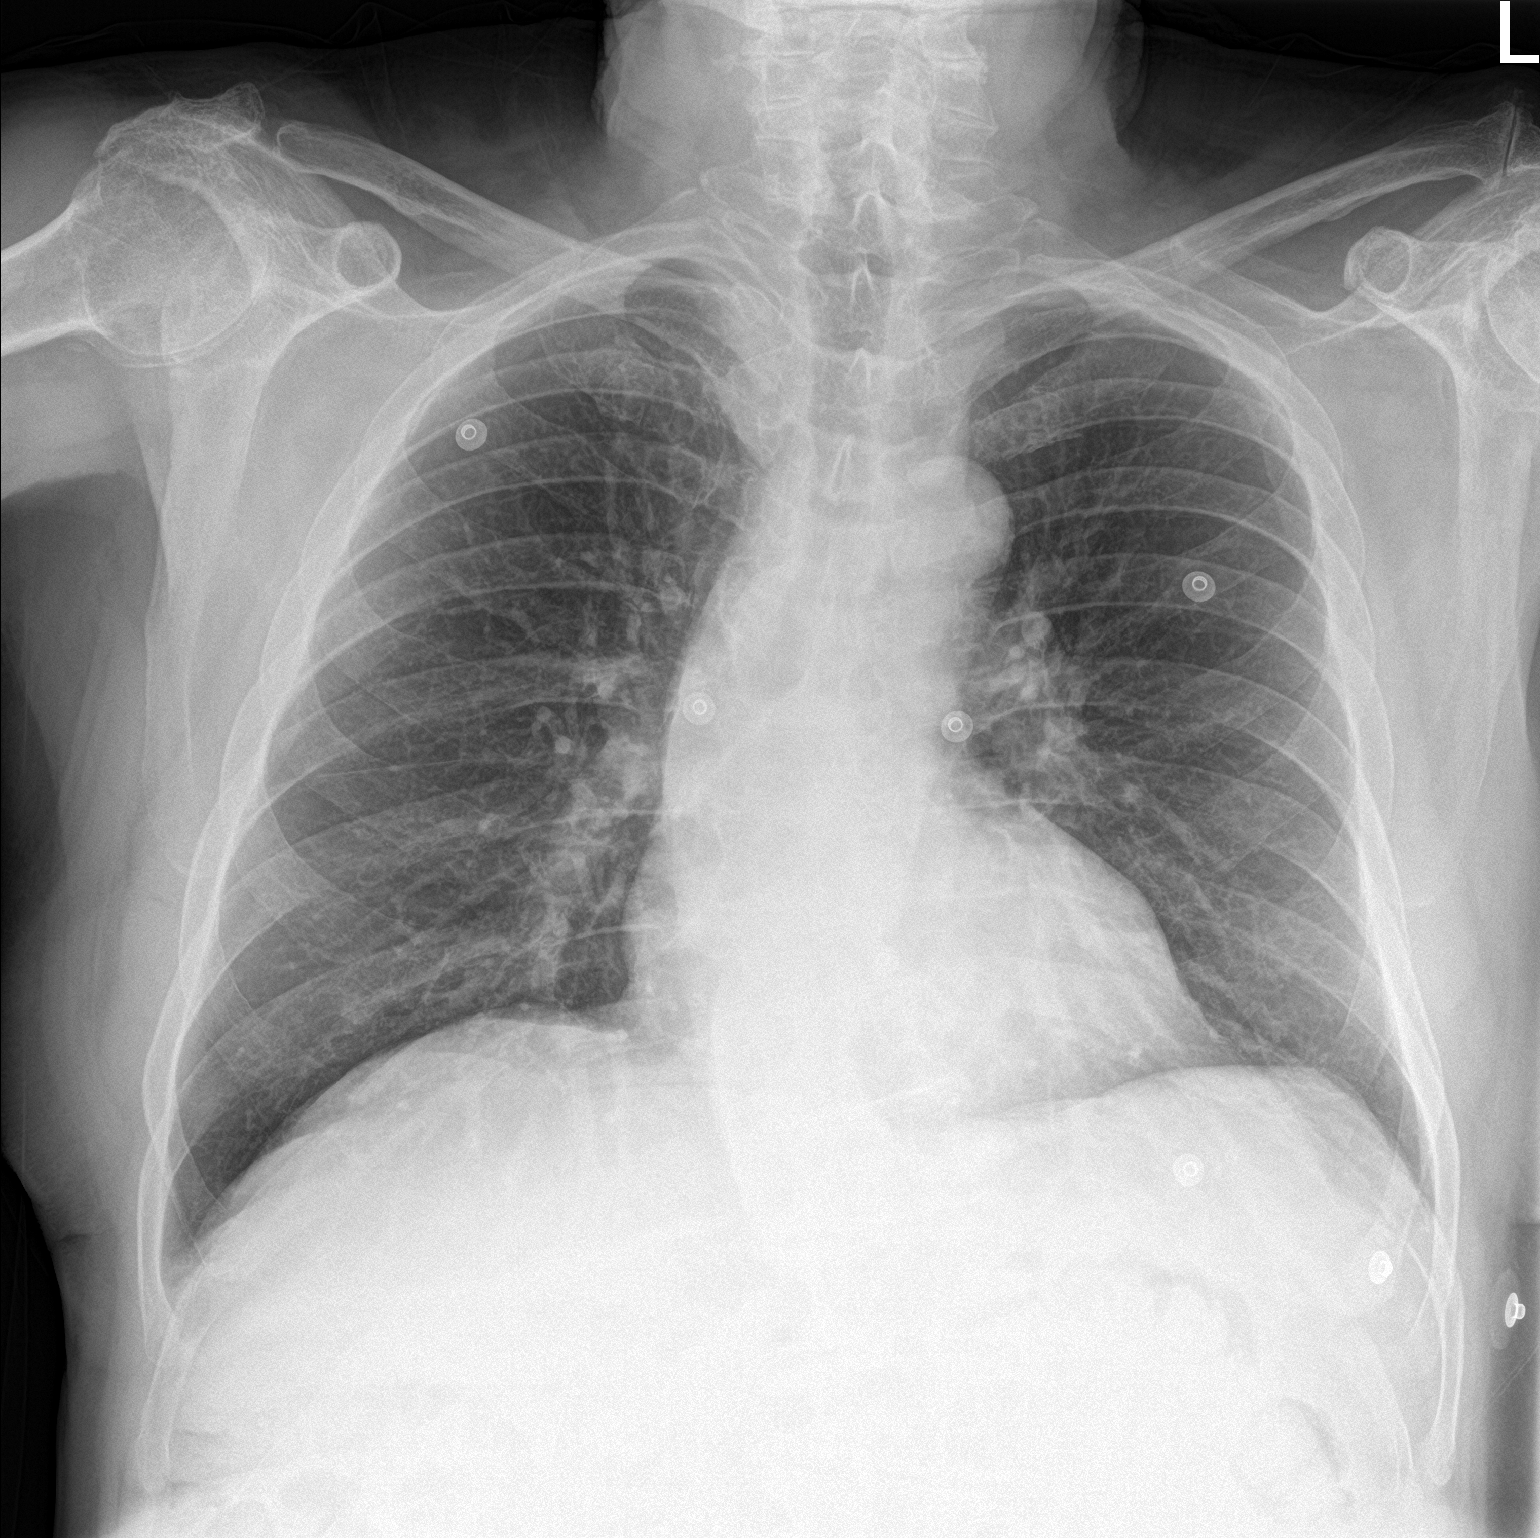

[1 of 1 positions shown; findings below may reference images not displayed]

FINDINGS: No focal consolidation, pleural effusion, or pneumothorax. The
cardiac silhouette is within limits. Degenerative changes of the
spine and scoliosis. No acute osseous pathology.
IMPRESSION: No active disease.

## 2023-03-25 NOTE — Progress Notes (Addendum)
 Cardiology Office Note:   Date:  03/28/2023  ID:  Tim Walters, DOB [birth year], MRN [id number] PCP:  Verma Gobble, NP  [facility] Providers Cardiologist:  [name], MD Referring MD: Verma Gobble, NP  Chief Complaint/Reason for Referral:  F/u for CAD ASSESSMENT:    1. Angina pectoris (HCC)   2. CAD in native artery   3. Hyperlipidemia LDL goal <70   4. Elevated lipoprotein(a)   5. Aortic atherosclerosis (HCC)   6. Essential hypertension, benign   7. CKD (chronic kidney disease) stage 2, GFR 60-89 ml/min   8. BMI 32.0-32.9,adult   9. Prediabetes     PLAN:   In order of problems listed above: Angina pectoris: Stable and well-controlled.  Will start Imdur  30 mg at bedtime and continue as needed nitroglycerin .  I did go through the nitroglycerin  precautions with the patient. Coronary artery disease: Continue aspirin  81 mg, atorvastatin  80 mg, Zetia  10 mg, Lopressor  25 mg twice daily. Hyperlipidemia: Continue atorvastatin  80 mg and Zetia  10 mg.  Check lipid panel and LFTs next week Elevated LP(a): Check lipids today; target LDL less than 55. Aortic atherosclerosis: Continue aspirin  81 mg, atorvastatin  80 mg, and Zetia  10 mg.  Strict blood pressure control. Hypertension: Will increase ramipril  to 20 mg and check BMP next week. CKD stage II: Increase ramipril  to 20 mg for renal protection. Elevated BMI: Diet and exercise modification.  Hemoglobin A1c was 6.3 in September. Prediabetes: Continue aspirin  81 mg, atorvastatin  80 mg, and ramipril  now 20 mg.  Consider Jardiance in the future for renal protection.            Dispo:  Return in about 6 months (around 09/25/2023).      Medication Adjustments/Labs and Tests Ordered: Current medicines are reviewed at length with the patient today.  Concerns regarding medicines are outlined above.  The following changes have been made:     Labs/tests ordered: Orders Placed This Encounter  Procedures   Lipid  panel   Hepatic function panel   Basic Metabolic Panel (BMET)    Medication Changes: Meds ordered this encounter  Medications   DISCONTD: ramipril  (ALTACE ) 10 MG capsule    Sig: Take 2 capsules (20 mg total) by mouth daily.    Dispense:  180 capsule    Refill:  3    Dose increase   isosorbide  mononitrate (IMDUR ) 30 MG 24 hr tablet    Sig: Take 1 tablet (30 mg total) by mouth at bedtime.    Dispense:  90 tablet    Refill:  3   ramipril  (ALTACE ) 10 MG capsule    Sig: Take 2 capsules (20 mg total) by mouth daily.    Dispense:  180 capsule    Refill:  3    Dose increase    Current medicines are reviewed at length with the patient today.  The patient does not have concerns regarding medicines.  I spent 33 minutes reviewing all clinical data during and prior to this visit including all relevant imaging studies, laboratories, clinical information from other health systems and prior notes from both Cardiology and other specialties, interviewing the patient, conducting a complete physical examination, and coordinating care in order to formulate a comprehensive and personalized evaluation and treatment plan.   History of Present Illness:      FOCUSED PROBLEM LIST:   CAD BMS RCA ACS 2003 Hypertension Hyperlipidemia LP(a) 225 Aortic atherosclerosis Chest CT 2024 CKD stage II BMI 32 Prediabetes  7/24:  The patient  is a 78 y.o. male with the indicated medical history here for cardiology follow-up.  Initiation had been cared for by Dr. Felipe Horton for a long time.  He was seen in our clinic in May with increasing fatigue and shortness of breath.  He was referred for nuclear stress test which demonstrated low risk findings.  Echocardiogram demonstrated preserved LV function with no significant valvular abnormalities and aortic dilatation with a diameter of about 40 mm.  Monitor demonstrated asymptomatic SVT and the patient's Coreg  was changed to metoprolol  25 mg twice daily.  His lipid panel in  March showed an LDL of 60.   The patient tells me that he likes to walk.  About 8 months ago he was walking 3 to 4 miles a day.  Now he walks about a mile a day due to bilateral bursitis.  He is participating in PT to help with this.  Interestingly he will develop chest tightness for the first half mile of his walk.  Then during the second half mile this seems to remit.  He has noticed that he has low energy and is fatigued a lot of the day.  He has gained about 10 pounds over the last couple of years due to his hip issues which have made him a less active.  His wife says he does not snore however his Apple watch demonstrates fragmented sleep.  He does have days when he wakes up where he feels not well rested.  He fortunately has not required any emergency room visits or hospitalizations.  He is otherwise well without significant complaints.  Plan: Continue current medical therapy, check reflex TSH and LP(a).  2/25: In the interim the patient had a lipid panel which showed an LDL of 60 however his LP(a) was quite elevated.  Zetia  was started and his LDL is now down to 39 when checked in September.  Patient is doing well.  He still develops stable angina after walking about a mile at the [facility].  This can be aborted if he takes it as needed nitroglycerin  before he exerts himself.  He would like to start Imdur  because of this.  He has not required nitroglycerin  at any other times.  He has not had chest pain at rest.  This only occurs after he has been exerting himself and walking for about a mile.  He has had no issues with his atorvastatin .  He has had some issues with some postnasal drip specially in the morning.  He used to have seasonal allergies but is not sure about this now.  He denies any severe bleeding or myalgias or arthralgias in response to Lipitor .  He has not required any emergency room visits or hospitalizations.  He is otherwise well and without significant complaints today.             Current Medications: Current Meds  Medication Sig   aspirin  81 MG tablet Take 1 tablet (81 mg total) by mouth daily.   atorvastatin  (LIPITOR ) 80 MG tablet TAKE 1 TABLET BY MOUTH ONCE  DAILY   ezetimibe  (ZETIA ) 10 MG tablet Take 1 tablet (10 mg total) by mouth daily.   hydrochlorothiazide  (MICROZIDE ) 12.5 MG capsule Take 1 capsule (12.5 mg total) by mouth daily.   isosorbide  mononitrate (IMDUR ) 30 MG 24 hr tablet Take 1 tablet (30 mg total) by mouth at bedtime.   metoprolol  tartrate (LOPRESSOR ) 25 MG tablet TAKE 1 TABLET BY MOUTH TWICE  DAILY   Multiple Vitamin (MULTIVITAMIN WITH MINERALS) TABS tablet  Take 1 tablet by mouth daily.   nitroGLYCERIN  (NITROSTAT ) 0.4 MG SL tablet Place 1 tablet (0.4 mg total) under the tongue every 5 (five) minutes as needed for chest pain.   OMEPRAZOLE PO Take 20 mg by mouth every morning.    sertraline  (ZOLOFT ) 50 MG tablet TAKE 1 TABLET BY MOUTH DAILY   [DISCONTINUED] ramipril  (ALTACE ) 10 MG capsule Take 1 capsule (10 mg total) by mouth daily.   [DISCONTINUED] ramipril  (ALTACE ) 10 MG capsule Take 2 capsules (20 mg total) by mouth daily.     Review of Systems:   Please see the history of present illness.    All other systems reviewed and are negative.     EKGs/Labs/Other Test Reviewed:   EKG: EKG from May 2024 demonstrates sinus rhythm with PACs  EKG Interpretation Date/Time:    Ventricular Rate:    PR Interval:    QRS Duration:    QT Interval:    QTC Calculation:   R Axis:      Text Interpretation:           Risk Assessment/Calculations:          Physical Exam:   VS:  BP 136/74   Pulse 72   Ht 5\' 7"  (1.702 m)   Wt 208 lb 12.8 oz (94.7 kg)   SpO2 97%   BMI 32.70 kg/m        Wt Readings from Last 3 Encounters:  03/28/23 208 lb 12.8 oz (94.7 kg)  11/18/22 212 lb 3.2 oz (96.3 kg)  08/20/22 204 lb 12.8 oz (92.9 kg)      GENERAL:  No apparent distress, AOx3 HEENT:  No carotid bruits, +2 carotid impulses, no scleral icterus CAR:  RRR  no murmurs, gallops, rubs, or thrills RES:  Clear to auscultation bilaterally ABD:  Soft, nontender, nondistended, positive bowel sounds x 4 VASC:  +2 radial pulses, +2 carotid pulses NEURO:  CN 2-12 grossly intact; motor and sensory grossly intact PSYCH:  No active depression or anxiety EXT:  No edema, ecchymosis, or cyanosis  Signed, Caffie Sotto K Laurena Valko, MD  03/28/2023 11:00 AM    [facility] [address] [place], [place], [place]  [address] Phone: [phone]; Fax: [phone]   Note:  This document was prepared using Dragon voice recognition software and may include unintentional dictation errors.

## 2023-03-28 ENCOUNTER — Encounter: Payer: Self-pay | Admitting: Internal Medicine

## 2023-03-28 ENCOUNTER — Ambulatory Visit: Payer: Medicare Other | Attending: Internal Medicine | Admitting: Internal Medicine

## 2023-03-28 VITALS — BP 136/74 | HR 72 | Ht 67.0 in | Wt 208.8 lb

## 2023-03-28 DIAGNOSIS — E785 Hyperlipidemia, unspecified: Secondary | ICD-10-CM

## 2023-03-28 DIAGNOSIS — N182 Chronic kidney disease, stage 2 (mild): Secondary | ICD-10-CM | POA: Diagnosis not present

## 2023-03-28 DIAGNOSIS — I10 Essential (primary) hypertension: Secondary | ICD-10-CM

## 2023-03-28 DIAGNOSIS — I209 Angina pectoris, unspecified: Secondary | ICD-10-CM

## 2023-03-28 DIAGNOSIS — I700 Atherosclerosis of aorta: Secondary | ICD-10-CM

## 2023-03-28 DIAGNOSIS — I25118 Atherosclerotic heart disease of native coronary artery with other forms of angina pectoris: Secondary | ICD-10-CM | POA: Diagnosis not present

## 2023-03-28 DIAGNOSIS — R7303 Prediabetes: Secondary | ICD-10-CM

## 2023-03-28 DIAGNOSIS — E7841 Elevated Lipoprotein(a): Secondary | ICD-10-CM

## 2023-03-28 DIAGNOSIS — I2510 Atherosclerotic heart disease of native coronary artery without angina pectoris: Secondary | ICD-10-CM

## 2023-03-28 DIAGNOSIS — Z6832 Body mass index (BMI) 32.0-32.9, adult: Secondary | ICD-10-CM

## 2023-03-28 MED ORDER — RAMIPRIL 10 MG PO CAPS: 20.0000 mg | ORAL_CAPSULE | Freq: Every day | ORAL | 3 refills | Status: DC

## 2023-03-28 MED ORDER — ISOSORBIDE MONONITRATE ER 30 MG PO TB24: 30.0000 mg | ORAL_TABLET | Freq: Every day | ORAL | 3 refills | Status: DC

## 2023-03-28 NOTE — Patient Instructions (Signed)
 Medication Instructions:  Your physician has recommended you make the following change in your medication:  1.) increase ramipril  to 20 mg - one tablet daily 2.) start isosorbide  (IMDUR ) 30 mg - take one tablet daily at bedtime  *If you need a refill on your cardiac medications before your next appointment, please call your pharmacy*   Lab Work: Go to any [facility] in 7-10 days (cmp, lipids)   Testing/Procedures: none   Follow-Up: At [facility], you and your health needs are our priority.  As part of our continuing mission to provide you with exceptional heart care, we have created designated Provider Care Teams.  These Care Teams include your primary Cardiologist (physician) and Advanced Practice Providers (APPs -  Physician Assistants and Nurse Practitioners) who all work together to provide you with the care you need, when you need it.   Your next appointment:   6 month(s)  Provider:   Arun K Thukkani, MD

## 2023-03-30 ENCOUNTER — Encounter: Payer: Self-pay | Admitting: Internal Medicine

## 2023-04-12 DIAGNOSIS — R208 Other disturbances of skin sensation: Secondary | ICD-10-CM | POA: Diagnosis not present

## 2023-04-12 DIAGNOSIS — Z789 Other specified health status: Secondary | ICD-10-CM | POA: Diagnosis not present

## 2023-04-12 DIAGNOSIS — L538 Other specified erythematous conditions: Secondary | ICD-10-CM | POA: Diagnosis not present

## 2023-04-12 DIAGNOSIS — Z08 Encounter for follow-up examination after completed treatment for malignant neoplasm: Secondary | ICD-10-CM | POA: Diagnosis not present

## 2023-04-12 DIAGNOSIS — Z85828 Personal history of other malignant neoplasm of skin: Secondary | ICD-10-CM | POA: Diagnosis not present

## 2023-04-12 DIAGNOSIS — L821 Other seborrheic keratosis: Secondary | ICD-10-CM | POA: Diagnosis not present

## 2023-04-12 DIAGNOSIS — L814 Other melanin hyperpigmentation: Secondary | ICD-10-CM | POA: Diagnosis not present

## 2023-04-12 DIAGNOSIS — L820 Inflamed seborrheic keratosis: Secondary | ICD-10-CM | POA: Diagnosis not present

## 2023-04-12 DIAGNOSIS — D225 Melanocytic nevi of trunk: Secondary | ICD-10-CM | POA: Diagnosis not present

## 2023-04-12 DIAGNOSIS — L2989 Other pruritus: Secondary | ICD-10-CM | POA: Diagnosis not present

## 2023-04-17 ENCOUNTER — Other Ambulatory Visit: Payer: Self-pay | Admitting: Internal Medicine

## 2023-04-17 ENCOUNTER — Other Ambulatory Visit: Payer: Self-pay | Admitting: Nurse Practitioner

## 2023-04-19 ENCOUNTER — Encounter: Payer: Self-pay | Admitting: Internal Medicine

## 2023-04-19 DIAGNOSIS — I10 Essential (primary) hypertension: Secondary | ICD-10-CM

## 2023-04-19 DIAGNOSIS — E785 Hyperlipidemia, unspecified: Secondary | ICD-10-CM

## 2023-04-20 DIAGNOSIS — E785 Hyperlipidemia, unspecified: Secondary | ICD-10-CM | POA: Diagnosis not present

## 2023-04-20 DIAGNOSIS — I10 Essential (primary) hypertension: Secondary | ICD-10-CM | POA: Diagnosis not present

## 2023-04-20 LAB — HEPATIC FUNCTION PANEL
ALT: 33 IU/L (ref 0–44)
AST: 24 IU/L (ref 0–40)
Albumin: 4 g/dL (ref 3.8–4.8)
Alkaline Phosphatase: 84 IU/L (ref 44–121)
Bilirubin Total: 0.5 mg/dL (ref 0.0–1.2)
Bilirubin, Direct: 0.23 mg/dL (ref 0.00–0.40)
Total Protein: 6.5 g/dL (ref 6.0–8.5)

## 2023-04-20 LAB — LIPID PANEL
Chol/HDL Ratio: 2 ratio (ref 0.0–5.0)
Cholesterol, Total: 106 mg/dL (ref 100–199)
HDL: 54 mg/dL (ref >39)
LDL Chol Calc (NIH): 41 mg/dL (ref 0–99)
Triglycerides: 41 mg/dL (ref 0–149)
VLDL Cholesterol Cal: 11 mg/dL (ref 5–40)

## 2023-04-20 LAB — BASIC METABOLIC PANEL
BUN/Creatinine Ratio: 26 — ABNORMAL HIGH (ref 10–24)
BUN: 26 mg/dL (ref 8–27)
CO2: 23 mmol/L (ref 20–29)
Calcium: 9.1 mg/dL (ref 8.6–10.2)
Chloride: 103 mmol/L (ref 96–106)
Creatinine, Ser: 0.99 mg/dL (ref 0.76–1.27)
Glucose: 108 mg/dL — ABNORMAL HIGH (ref 70–99)
Potassium: 4.5 mmol/L (ref 3.5–5.2)
Sodium: 139 mmol/L (ref 134–144)
eGFR: 77 mL/min/{1.73_m2} (ref >59)

## 2023-04-21 ENCOUNTER — Encounter: Payer: Self-pay | Admitting: Internal Medicine

## 2023-05-12 ENCOUNTER — Encounter: Payer: Self-pay | Admitting: Internal Medicine

## 2023-05-16 ENCOUNTER — Ambulatory Visit (INDEPENDENT_AMBULATORY_CARE_PROVIDER_SITE_OTHER): Payer: Medicare Other | Admitting: Nurse Practitioner

## 2023-05-16 VITALS — BP 128/74 | HR 75 | Temp 97.3°F | Ht 67.0 in | Wt 215.2 lb

## 2023-05-16 DIAGNOSIS — Z0000 Encounter for general adult medical examination without abnormal findings: Secondary | ICD-10-CM

## 2023-05-16 NOTE — Progress Notes (Signed)
 Subjective:   Tim Walters is a 78 y.o. male who presents for Medicare Annual/Subsequent preventive examination.  Visit Complete: In person    Cardiac Risk Factors include: advanced age (>27men, >20 women);family history of premature cardiovascular disease;hypertension;dyslipidemia;obesity (BMI >30kg/m2);male gender     Objective:    Today's Vitals   05/16/23 1304  BP: 128/74  Pulse: 75  Temp: (!) 97.3 F (36.3 C)  SpO2: 94%  Weight: 215 lb 3.2 oz (97.6 kg)  Height: 5\' 7"  (1.702 m)   Body mass index is 33.71 kg/m.     05/16/2023   10:30 AM 11/19/2022    9:02 AM 11/18/2022    3:38 PM 05/12/2022    2:43 PM 05/11/2022    8:41 AM 05/10/2022    4:44 PM 09/02/2021    1:15 PM  Advanced Directives  Does Patient Have a Medical Advance Directive? Yes Yes Yes Yes Yes Yes Yes  Type of Advance Directive Out of facility DNR (pink MOST or yellow form) Out of facility DNR (pink MOST or yellow form) Out of facility DNR (pink MOST or yellow form) Out of facility DNR (pink MOST or yellow form) Out of facility DNR (pink MOST or yellow form) Out of facility DNR (pink MOST or yellow form) Healthcare Power of [place];Living will;Out of facility DNR (pink MOST or yellow form)  Does patient want to make changes to medical advance directive? No - Patient declined No - Patient declined No - Patient declined No - Patient declined No - Patient declined No - Patient declined No - Patient declined  Copy of Healthcare Power of Attorney in Chart?       No - copy requested  Pre-existing out of facility DNR order (yellow form or pink MOST form) Pink MOST form placed in chart (order not valid for inpatient use) Pink MOST form placed in chart (order not valid for inpatient use) Pink MOST form placed in chart (order not valid for inpatient use)  Pink MOST form placed in chart (order not valid for inpatient use) Pink MOST form placed in chart (order not valid for inpatient use)     Current Medications  (verified) Outpatient Encounter Medications as of 05/16/2023  Medication Sig   aspirin 81 MG tablet Take 1 tablet (81 mg total) by mouth daily.   atorvastatin (LIPITOR) 80 MG tablet TAKE 1 TABLET BY MOUTH ONCE  DAILY   ezetimibe (ZETIA) 10 MG tablet TAKE 1 TABLET BY MOUTH DAILY   hydrochlorothiazide (MICROZIDE) 12.5 MG capsule TAKE 1 CAPSULE BY MOUTH DAILY   isosorbide mononitrate (IMDUR) 30 MG 24 hr tablet Take 1 tablet (30 mg total) by mouth at bedtime.   metoprolol tartrate (LOPRESSOR) 25 MG tablet TAKE 1 TABLET BY MOUTH TWICE  DAILY   Multiple Vitamin (MULTIVITAMIN WITH MINERALS) TABS tablet Take 1 tablet by mouth daily.   nitroGLYCERIN (NITROSTAT) 0.4 MG SL tablet Place 1 tablet (0.4 mg total) under the tongue every 5 (five) minutes as needed for chest pain.   OMEPRAZOLE PO Take 20 mg by mouth every morning.    ramipril (ALTACE) 10 MG capsule Take 2 capsules (20 mg total) by mouth daily.   sertraline (ZOLOFT) 50 MG tablet TAKE 1 TABLET BY MOUTH DAILY   Facility-Administered Encounter Medications as of 05/16/2023  Medication   regadenoson (LEXISCAN) injection SOLN 0.4 mg    Allergies (verified) Patient has no known allergies.   History: Past Medical History:  Diagnosis Date   Arthritis    lt ankle  Breast lump    Per records from [facility]    Colon polyp    Coronary artery disease    a. BMS to RCA 2003 with residual LAD/diag disease treated medically, normal EF.   COVID    Depression    Diverticulosis    Per records from [facility]    GERD (gastroesophageal reflux disease)    Hyperkalemia    a. K of 5.2 in 2017.   Hyperlipidemia    Hypertension    Idiopathic peripheral neuropathy    Per records from [facility]    Impaired fasting glucose    Per records from [facility]    Kidney stones    Monoclonal gammopathy of undetermined significance    Per records from [facility]    Myocardial infarction [facility]) 11/15/2001   Dr. Verdis [facility]  Cardiology)   OSA (obstructive sleep apnea)    Per [facility] New Patient Packet   Peripheral neuropathy    Left Foot, Per [facility] New Patient Packet   Pre-diabetes    Sleep apnea    had test several yr ago-said he did not need a cpap-still snores   Torn rotator cuff    Per [facility] New Patient Packet   Venous insufficiency of left leg    Per [facility] New Patient Packet   Wears glasses    Past Surgical History:  Procedure Laterality Date   ANGIOPLASTY  2003   Per records from [facility]    BASAL CELL CARCINOMA EXCISION     left tricep   BASAL CELL CARCINOMA EXCISION  11/10/2022   right shoulder   CARDIAC CATHETERIZATION  23003   stent rca   CARDIOVASCULAR STRESS TEST  02/16/2016   Per records from [facility]    COLONOSCOPY  06/17/2003   Per records from [facility], Dr.Ganem to be repeated 2015   coronary artery stent  11/15/2001   CORONARY STENT PLACEMENT  11/15/2001   Per records from [facility]    CYST REMOVAL TRUNK Left 05/08/2013   Procedure: CYST REMOVAL BACK;  Surgeon: Ernestene Mention, MD;  Location: [facility];  Service: General;  Laterality: Left;   INGUINAL HERNIA REPAIR Left 03/24/2015   Procedure: OPEN REPAIR LEFT INGUINAL HERNIA ;  Surgeon: Claud Kelp, MD;  Location:  [facility];  Service: General;  Laterality: Left;   INSERTION OF MESH Left 03/24/2015   Procedure: INSERTION OF MESH;  Surgeon: Claud Kelp, MD;  Location: [facility];  Service: General;  Laterality: Left;   SHOULDER ARTHROSCOPY W/ ROTATOR CUFF REPAIR  2011   right   SPINE SURGERY  02/12/1993   L2, L3 fragmented disc   VASECTOMY  1987   VEIN REPAIR  09/2018   Vein injections    Family History  Problem Relation Age of Onset   Macular degeneration Mother    COPD Mother    Non-Hodgkin's lymphoma Mother    Lung cancer Mother        Per [facility] New Patient Packet    Hypertension Father    Suicidality Father 84       Per [facility] New Patient Packet     Depression Father    Angina Father        Per records from [facility]    Diverticulosis Father        Per records from [facility]    Heart disease Father    High Cholesterol Sister    Macular degeneration Maternal  Grandmother    Schizophrenia Son    Bipolar disorder Son    Autism Son    Post-traumatic stress disorder Son    Diabetes type II Daughter    Breast cancer Neg Hx    Colon cancer Neg Hx    Esophageal cancer Neg Hx    Stomach cancer Neg Hx    Liver disease Neg Hx    Pancreatic cancer Neg Hx    Social History   Socioeconomic History   Marital status: Married    Spouse name: Dois Davenport   Number of children: 7   Years of education: 16   Highest education level: Bachelor's degree (e.g., BA, AB, BS)  Occupational History   Occupation: Retired  Tobacco Use   Smoking status: Former    Current packs/day: 0.00    Types: Cigarettes    Start date: 02/15/1962    Quit date: 02/15/1974    Years since quitting: 49.2   Smokeless tobacco: Never  Vaping Use   Vaping status: Never Used  Substance and Sexual Activity   Alcohol use: Yes    Alcohol/week: 14.0 standard drinks of alcohol    Types: 14 Standard drinks or equivalent per week    Comment: 14 drinks weekly in the evening    Drug use: No   Sexual activity: Not Currently  Other Topics Concern   Not on file  Social History Narrative   Lives with wife, Dois Davenport   Caffeine use: Coffee daily   Caffeine free soda      As of 03/28/2018:   Diet: N/A      Caffeine: Yes      Married, if yes what year: Yes, 1971      Do you live in a house, apartment, assisted living, condo, trailer, ect: House, one stories, 2 persons      Pets: 1 cat      Current/Past profession: Oncologist, Surveyor, minerals      Exercise: Yes, cardio strength 5 days weekly          Living Will: No   DNR: No, would like to discuss   POA/HPOA: Yes      Functional Status:   Do you have difficulty bathing or dressing yourself? No   Do  you have difficulty preparing food or eating? No   Do you have difficulty managing your medications? No   Do you have difficulty managing your finances? No   Do you have difficulty affording your medications? No   Social Drivers of Corporate investment banker Strain: Low Risk  (05/16/2023)   Overall Financial Resource Strain (CARDIA)    Difficulty of Paying Living Expenses: Not hard at all  Food Insecurity: No Food Insecurity (05/16/2023)   Hunger Vital Sign    Worried About Running Out of Food in the Last Year: Never true    Ran Out of Food in the Last Year: Never true  Transportation Needs: No Transportation Needs (05/16/2023)   PRAPARE - Administrator, Civil Service (Medical): No    Lack of Transportation (Non-Medical): No  Physical Activity: Insufficiently Active (05/16/2023)   Exercise Vital Sign    Days of Exercise per Week: 3 days    Minutes of Exercise per Session: 30 min  Stress: Stress Concern Present (05/16/2023)   [employer] of Occupational Health - Occupational Stress Questionnaire    Feeling of Stress : To some extent  Social Connections: Moderately Isolated (05/16/2023)   Social Connection and Isolation Panel [NHANES]  Frequency of Communication with Friends and Family: Twice a week    Frequency of Social Gatherings with Friends and Family: More than three times a week    Attends Religious Services: Never    Database administrator or Organizations: No    Attends Engineer, structural: Not on file    Marital Status: Married    Tobacco Counseling Counseling given: Not Answered   Clinical Intake:  Pre-visit preparation completed: Yes  Pain : No/denies pain     BMI - recorded: 33 Nutritional Status: BMI > 30  Obese Nutritional Risks: None Diabetes: No  How often do you need to have someone help you when you read instructions, pamphlets, or other written materials from your doctor or pharmacy?: 1 - Never         Activities of  Daily Living    05/16/2023    1:12 PM  In your present state of health, do you have any difficulty performing the following activities:  Hearing? 0  Vision? 0  Difficulty concentrating or making decisions? 0  Walking or climbing stairs? 1  Comment climbing stairs  Dressing or bathing? 0  Doing errands, shopping? 0  Preparing Food and eating ? N  Using the Toilet? N  In the past six months, have you accidently leaked urine? Y  Do you have problems with loss of bowel control? N  Managing your Medications? N  Managing your Finances? N  Housekeeping or managing your Housekeeping? N    Patient Care Team: [name], NP as PCP - General (Geriatric Medicine) Orbie Pyo, MD as PCP - Cardiology (Cardiology) Claud Kelp, MD as Consulting Physician (General Surgery) Teryl Lucy, MD as Consulting Physician (Orthopedic Surgery) Donzetta Starch, MD as Consulting Physician (Dermatology) York Spaniel, MD (Inactive) as Consulting Physician (Neurology) Golda Acre, OD (Optometry) [facility] [place] Orbie Pyo, MD as Consulting Physician (Cardiology)  Indicate any recent Medical Services you may have received from other than [facility] providers in the past year (date may be approximate).     Assessment:   This is a routine wellness examination for [place].  Hearing/Vision screen Hearing Screening - Comments:: No hearing issues  Vision Screening - Comments:: Last eye exam with [facility], Dr.Woods within last year    Goals Addressed   None    Depression Screen    05/16/2023    1:02 PM 11/19/2022    9:02 AM 05/12/2022    2:38 PM 05/11/2022    8:40 AM 05/10/2022    2:59 PM 05/11/2021    2:44 PM 05/05/2021    2:49 PM  PHQ 2/9 Scores  PHQ - 2 Score 0 0 0 0 0 0 0  PHQ- 9 Score   0  3      Fall Risk    05/16/2023    1:02 PM 11/19/2022    9:02 AM 05/12/2022    2:37 PM 05/11/2022    8:40 AM 05/10/2022    2:59 PM  Fall Risk   Falls in the past year? 0 0 0  0 0  Number falls in past yr: 0 0 0 0 0  Injury with Fall? 0 0 0 0 0  Risk for fall due to : No Fall Risks No Fall Risks No Fall Risks No Fall Risks No Fall Risks  Follow up Falls evaluation completed Falls evaluation completed Falls evaluation completed Falls evaluation completed     MEDICARE RISK AT HOME: Medicare Risk at Home  Any stairs in or around the home?: Yes If so, are there any without handrails?: No Home free of loose throw rugs in walkways, pet beds, electrical cords, etc?: Yes Adequate lighting in your home to reduce risk of falls?: Yes Life alert?: No Use of a cane, walker or w/c?: No Grab bars in the bathroom?: Yes Shower chair or bench in shower?: Yes Elevated toilet seat or a handicapped toilet?: Yes  TIMED UP AND GO:  Was the test performed?  No    Cognitive Function:    05/12/2022    2:39 PM 04/26/2018    2:19 PM  MMSE - Mini Mental State Exam  Orientation to time 4 5  Orientation to Place 5 5  Registration 3 3  Attention/ Calculation 5   Recall 3 3  Language- name 2 objects 2 2  Language- repeat 1 1  Language- follow 3 step command 3 3  Language- read & follow direction 1 1  Write a sentence 1 1  Copy design 1 1  Total score 29         05/16/2023    1:06 PM 05/11/2022    8:42 AM 05/05/2021    2:53 PM 04/30/2020   10:03 AM 04/30/2019    1:07 PM  6CIT Screen  What Year? 0 points 0 points 0 points 0 points 0 points  What month? 0 points 0 points 0 points 0 points 0 points  What time? 0 points 0 points 0 points 0 points 0 points  Count back from 20 0 points 0 points 0 points 0 points 0 points  Months in reverse 0 points 0 points 0 points 0 points 0 points  Repeat phrase 0 points 0 points 0 points 0 points 0 points  Total Score 0 points 0 points 0 points 0 points 0 points    Immunizations Immunization History  Administered Date(s) Administered   Fluad Quad(high Dose 65+) 11/06/2018, 11/10/2020, 11/09/2021   Influenza, High Dose Seasonal PF  10/30/2019, 11/09/2022   Influenza, Seasonal, Injecte, Preservative Fre 02/15/2012   Influenza-Unspecified 12/17/2016, 01/15/2018   PFIZER Comirnaty(Gray Top)Covid-19 Tri-Sucrose Vaccine 11/09/2022   PFIZER(Purple Top)SARS-COV-2 Vaccination 02/24/2019, 03/17/2019, 10/30/2019, 05/21/2020   Pneumococcal Conjugate-13 02/19/2015   Pneumococcal Polysaccharide-23 09/17/2009   Td 03/31/2001   Tdap 10/13/2010, 04/10/2018   Zoster Recombinant(Shingrix) 02/03/2017, 04/14/2017   Zoster, Live 05/25/2012    TDAP status: Up to date  Flu Vaccine status: Up to date  Pneumococcal vaccine status: Up to date  Covid-19 vaccine status: Information provided on how to obtain vaccines.   Qualifies for Shingles Vaccine? Yes   Zostavax completed No   Shingrix Completed?: Yes  Screening Tests Health Maintenance  Topic Date Due   COVID-19 Vaccine (6 - 2024-25 season) 10/17/2023 (Originally 01/04/2023)   Medicare Annual Wellness (AWV)  05/15/2024   DTaP/Tdap/Td (4 - Td or Tdap) 04/10/2028   Pneumonia Vaccine 23+ Years old  Completed   INFLUENZA VACCINE  Completed   Hepatitis C Screening  Completed   Zoster Vaccines- Shingrix  Completed   HPV VACCINES  Aged Out   Colonoscopy  Discontinued   Fecal DNA (Cologuard)  Discontinued    Health Maintenance  There are no preventive care reminders to display for this patient.   Colorectal cancer screening: No longer required.   Lung Cancer Screening: (Low Dose CT Chest recommended if Age 70-80 years, 20 pack-year currently smoking OR have quit w/in 15years.) does not qualify.   Lung Cancer Screening Referral: na  Additional Screening:  Hepatitis C Screening: does qualify; Completed   Vision Screening: Recommended annual ophthalmology exams for early detection of glaucoma and other disorders of the eye. Is the patient up to date with their annual eye exam?  Yes  Who is the provider or what is the name of the office in which the patient attends annual  eye exams? [place] If pt is not established with a provider, would they like to be referred to a provider to establish care? No .   Dental Screening: Recommended annual dental exams for proper oral hygiene   Community Resource Referral / Chronic Care Management: CRR required this visit?  No   CCM required this visit?  No     Plan:     I have personally reviewed and noted the following in the patient's chart:   Medical and social history Use of alcohol, tobacco or illicit drugs  Current medications and supplements including opioid prescriptions. Patient is not currently taking opioid prescriptions. Functional ability and status Nutritional status Physical activity Advanced directives List of other physicians Hospitalizations, surgeries, and ER visits in previous 12 months Vitals Screenings to include cognitive, depression, and falls Referrals and appointments  In addition, I have reviewed and discussed with patient certain preventive protocols, quality metrics, and best practice recommendations. A written personalized care plan for preventive services as well as general preventive health recommendations were provided to patient.     [name], NP   05/16/2023

## 2023-05-16 NOTE — Patient Instructions (Signed)
  [name] , Thank you for taking time to come for your Medicare Wellness Visit. I appreciate your ongoing commitment to your health goals. Please review the following plan we discussed and let me know if I can assist you in the future.     This is a list of the screening recommended for you and due dates:  Health Maintenance  Topic Date Due   COVID-19 Vaccine (6 - 2024-25 season) 10/17/2023*   Medicare Annual Wellness Visit  05/15/2024   DTaP/Tdap/Td vaccine (4 - Td or Tdap) 04/10/2028   Pneumonia Vaccine  Completed   Flu Shot  Completed   Hepatitis C Screening  Completed   Zoster (Shingles) Vaccine  Completed   HPV Vaccine  Aged Out   Colon Cancer Screening  Discontinued   Cologuard (Stool DNA test)  Discontinued  *Topic was postponed. The date shown is not the original due date.

## 2023-05-17 ENCOUNTER — Other Ambulatory Visit: Payer: Medicare Other

## 2023-05-17 ENCOUNTER — Encounter: Payer: Self-pay | Admitting: Internal Medicine

## 2023-05-17 DIAGNOSIS — R739 Hyperglycemia, unspecified: Secondary | ICD-10-CM

## 2023-05-17 DIAGNOSIS — E785 Hyperlipidemia, unspecified: Secondary | ICD-10-CM | POA: Diagnosis not present

## 2023-05-17 DIAGNOSIS — I10 Essential (primary) hypertension: Secondary | ICD-10-CM | POA: Diagnosis not present

## 2023-05-17 DIAGNOSIS — I700 Atherosclerosis of aorta: Secondary | ICD-10-CM | POA: Diagnosis not present

## 2023-05-18 LAB — LIPID PANEL
Cholesterol: 108 mg/dL (ref <200)
HDL: 61 mg/dL (ref >=40)
LDL Cholesterol (Calc): 33 mg/dL
Non-HDL Cholesterol (Calc): 47 mg/dL (ref <130)
Total CHOL/HDL Ratio: 1.8 (calc) (ref <5.0)
Triglycerides: 49 mg/dL (ref <150)

## 2023-05-18 LAB — COMPLETE METABOLIC PANEL WITHOUT GFR
AG Ratio: 1.7 (calc) (ref 1.0–2.5)
ALT: 21 U/L (ref 9–46)
AST: 19 U/L (ref 10–35)
Albumin: 3.8 g/dL (ref 3.6–5.1)
Alkaline phosphatase (APISO): 58 U/L (ref 35–144)
BUN/Creatinine Ratio: 32 (calc) — ABNORMAL HIGH (ref 6–22)
BUN: 26 mg/dL — ABNORMAL HIGH (ref 7–25)
CO2: 27 mmol/L (ref 20–32)
Calcium: 9 mg/dL (ref 8.6–10.3)
Chloride: 106 mmol/L (ref 98–110)
Creat: 0.82 mg/dL (ref 0.70–1.28)
Globulin: 2.3 g/dL (ref 1.9–3.7)
Glucose, Bld: 122 mg/dL — ABNORMAL HIGH (ref 65–99)
Potassium: 4.1 mmol/L (ref 3.5–5.3)
Sodium: 140 mmol/L (ref 135–146)
Total Bilirubin: 0.7 mg/dL (ref 0.2–1.2)
Total Protein: 6.1 g/dL (ref 6.1–8.1)

## 2023-05-18 LAB — CBC WITH DIFFERENTIAL/PLATELET
Absolute Lymphocytes: 1027 {cells}/uL (ref 850–3900)
Absolute Monocytes: 563 {cells}/uL (ref 200–950)
Basophils Absolute: 41 {cells}/uL (ref 0–200)
Basophils Relative: 0.7 %
Eosinophils Absolute: 168 {cells}/uL (ref 15–500)
Eosinophils Relative: 2.9 %
HCT: 40.6 % (ref 38.5–50.0)
Hemoglobin: 13.1 g/dL — ABNORMAL LOW (ref 13.2–17.1)
MCH: 31 pg (ref 27.0–33.0)
MCHC: 32.3 g/dL (ref 32.0–36.0)
MCV: 96 fL (ref 80.0–100.0)
MPV: 9.9 fL (ref 7.5–12.5)
Monocytes Relative: 9.7 %
Neutro Abs: 4002 {cells}/uL (ref 1500–7800)
Neutrophils Relative %: 69 %
Platelets: 178 10*3/uL (ref 140–400)
RBC: 4.23 10*6/uL (ref 4.20–5.80)
RDW: 12.4 % (ref 11.0–15.0)
Total Lymphocyte: 17.7 %
WBC: 5.8 10*3/uL (ref 3.8–10.8)

## 2023-05-18 LAB — HEMOGLOBIN A1C
Hgb A1c MFr Bld: 6.5 %{Hb} — ABNORMAL HIGH (ref <5.7)
Mean Plasma Glucose: 140 mg/dL
eAG (mmol/L): 7.7 mmol/L

## 2023-05-19 NOTE — H&P (View-Only) (Signed)
 Cardiology Office Note:   Date:  05/20/2023  ID:  Tim Walters, DOB 04/16/1945, MRN [id number] PCP:  [name], NP  [facility] Providers Cardiologist:  Alverda Skeans, MD Referring MD: [name], NP  Chief Complaint/Reason for Referral:  F/u for CAD ASSESSMENT:    1. Angina pectoris (HCC)   2. Hyperlipidemia LDL goal <70   3. Elevated lipoprotein(a)   4. Aortic atherosclerosis (HCC)   5. Essential hypertension, benign   6. CKD (chronic kidney disease) stage 2, GFR 60-89 ml/min   7. Prediabetes   8. Localized edema      PLAN:   In order of problems listed above: Angina pectoris: Continues to have lifestyle limiting angina despite medical therapy.  He did have 1 episode of chest pain at rest last week but nothing recently.  He has had occasional exertional angina but this is not routine.  He tells me that this is affecting his lifestyle.  Metoprolol, Imdur, and as needed nitroglycerin have not relieved his symptoms.  Will refer for coronary angiography and possible PCI for relief of symptoms.  I did tell him that if he develops severe chest pain at rest to proceed to the emergency department.  He understands this completely. Coronary artery disease: Continue aspirin 81 mg, atorvastatin 80 mg, Zetia 10 mg, Lopressor 25 mg twice daily. Hyperlipidemia: Continue atorvastatin 80 mg and Zetia 10 mg.  LDL was 33 recently.  Elevated LP(a): Continue atorvastatin 80 mg and Zetia 10 mg; LDL is 33.; target LDL less than 55. Aortic atherosclerosis: Continue aspirin 81 mg, atorvastatin 80 mg, and Zetia 10 mg.  Strict blood pressure control. Hypertension: Continue ramipril 20 mg daily.  Slightly above goal today. CKD stage II: Continue ramipril 20 mg daily and consider Jardiance in the future. Elevated BMI: Diet and exercise modification.  Hemoglobin A1c was 6.8  Prediabetes: Continue aspirin 81 mg, atorvastatin 80 mg, and ramipril now 20 mg.  Consider Jardiance in the  future for renal protection. Edema: Left lower extremity likely due to venous insufficiency.  I have asked the patient to continue with compression therapy.  Could refer for large artery ultrasound to evaluate further.            Dispo:  Return in about 2 weeks (around 06/03/2023).      Medication Adjustments/Labs and Tests Ordered: Current medicines are reviewed at length with the patient today.  Concerns regarding medicines are outlined above.  The following changes have been made:     Labs/tests ordered: Orders Placed This Encounter  Procedures   EKG 12-Lead    Medication Changes: No orders of the defined types were placed in this encounter.   Current medicines are reviewed at length with the patient today.  The patient does not have concerns regarding medicines.  I spent 33 minutes reviewing all clinical data during and prior to this visit including all relevant imaging studies, laboratories, clinical information from other health systems and prior notes from both Cardiology and other specialties, interviewing the patient, conducting a complete physical examination, and coordinating care in order to formulate a comprehensive and personalized evaluation and treatment plan.   History of Present Illness:      FOCUSED PROBLEM LIST:   CAD BMS RCA ACS 2003 Hypertension Hyperlipidemia LP(a) 225 Aortic atherosclerosis Chest CT 2024 CKD stage II BMI 32 Prediabetes  7/24:  The patient is a 78 y.o. male with the indicated medical history here for cardiology follow-up.  Initiation had been cared for  by Dr. [name] for a long time.  He was seen in our clinic in May with increasing fatigue and shortness of breath.  He was referred for nuclear stress test which demonstrated low risk findings.  Echocardiogram demonstrated preserved LV function with no significant valvular abnormalities and aortic dilatation with a diameter of about 40 mm.  Monitor demonstrated asymptomatic SVT and the  patient's Coreg was changed to metoprolol 25 mg twice daily.  His lipid panel in March showed an LDL of 60.   The patient tells me that he likes to walk.  About 8 months ago he was walking 3 to 4 miles a day.  Now he walks about a mile a day due to bilateral bursitis.  He is participating in PT to help with this.  Interestingly he will develop chest tightness for the first half mile of his walk.  Then during the second half mile this seems to remit.  He has noticed that he has low energy and is fatigued a lot of the day.  He has gained about 10 pounds over the last couple of years due to his hip issues which have made him a less active.  His wife says he does not snore however his Apple watch demonstrates fragmented sleep.  He does have days when he wakes up where he feels not well rested.  He fortunately has not required any emergency room visits or hospitalizations.  He is otherwise well without significant complaints.  Plan: Continue current medical therapy, check reflex TSH and LP(a).  2/25: In the interim the patient had a lipid panel which showed an LDL of 60 however his LP(a) was quite elevated.  Zetia was started and his LDL is now down to 39 when checked in September.  Patient is doing well.  He still develops stable angina after walking about a mile at the [facility].  This can be aborted if he takes it as needed nitroglycerin before he exerts himself.  He would like to start Imdur because of this.  He has not required nitroglycerin at any other times.  He has not had chest pain at rest.  This only occurs after he has been exerting himself and walking for about a mile.  He has had no issues with his atorvastatin.  He has had some issues with some postnasal drip specially in the morning.  He used to have seasonal allergies but is not sure about this now.  He denies any severe bleeding or myalgias or arthralgias in response to Lipitor.  He has not required any emergency room visits or hospitalizations.  He is  otherwise well and without significant complaints today.  Plan start Imdur 30 mg daily.  Check lipid panel, increase enalapril to 20 mg.  April 2025:  Patient consents to use of AI scribe. The patient was started on Imdur but did not experience any benefit in terms of decrease in anginal symptoms.  His LDL was 33. He tells me that he can walk a few laps and then he develops chest discomfort.  He will take a nitroglycerin and this will go away.  Sometimes he can walk 4 laps before this happens.  1 time last week she had an episode of chest pain at night.  He went to the bathroom and came back and he was still having this episode but then it resolved on its own.  He does not routinely get chest pain with any level of exertion or any specific level of exertion however  it is occurred quite frequently where he needs to stop and take a nitroglycerin.  He is typically a pretty active person and likes to exercise on a regular basis.  He tells me that this is affected his quality of life.            Current Medications: Current Meds  Medication Sig   aspirin 81 MG tablet Take 1 tablet (81 mg total) by mouth daily.   atorvastatin (LIPITOR) 80 MG tablet TAKE 1 TABLET BY MOUTH ONCE  DAILY   ezetimibe (ZETIA) 10 MG tablet TAKE 1 TABLET BY MOUTH DAILY   hydrochlorothiazide (MICROZIDE) 12.5 MG capsule TAKE 1 CAPSULE BY MOUTH DAILY   isosorbide mononitrate (IMDUR) 30 MG 24 hr tablet Take 1 tablet (30 mg total) by mouth at bedtime.   metoprolol tartrate (LOPRESSOR) 25 MG tablet TAKE 1 TABLET BY MOUTH TWICE  DAILY   Multiple Vitamin (MULTIVITAMIN WITH MINERALS) TABS tablet Take 1 tablet by mouth daily.   nitroGLYCERIN (NITROSTAT) 0.4 MG SL tablet Place 1 tablet (0.4 mg total) under the tongue every 5 (five) minutes as needed for chest pain.   OMEPRAZOLE PO Take 20 mg by mouth every morning.    ramipril (ALTACE) 10 MG capsule Take 2 capsules (20 mg total) by mouth daily.   sertraline (ZOLOFT) 50 MG tablet TAKE  1 TABLET BY MOUTH DAILY     Review of Systems:   Please see the history of present illness.    All other systems reviewed and are negative.     EKGs/Labs/Other Test Reviewed:   EKG: EKG from May 2024 demonstrates sinus rhythm with PACs  EKG Interpretation Date/Time:  Friday May 20 2023 10:39:07 EDT Ventricular Rate:  66 PR Interval:  168 QRS Duration:  84 QT Interval:  398 QTC Calculation: 417 R Axis:   -1  Text Interpretation: Normal sinus rhythm Minimal voltage criteria for LVH, may be normal variant ( R in aVL ) When compared with ECG of 02-Oct-2020 03:00, No significant change was found Confirmed by Alverda Skeans ([id number]) on 05/20/2023 10:52:45 AM         Risk Assessment/Calculations:          Physical Exam:   VS:  BP 132/78   Pulse 64   Ht 5\' 7"  (1.702 m)   Wt 210 lb (95.3 kg)   SpO2 98%   BMI 32.89 kg/m        Wt Readings from Last 3 Encounters:  05/20/23 210 lb (95.3 kg)  05/16/23 215 lb 3.2 oz (97.6 kg)  03/28/23 208 lb 12.8 oz (94.7 kg)      GENERAL:  No apparent distress, AOx3 HEENT:  No carotid bruits, +2 carotid impulses, no scleral icterus CAR: RRR  no murmurs, gallops, rubs, or thrills RES:  Clear to auscultation bilaterally ABD:  Soft, nontender, nondistended, positive bowel sounds x 4 VASC:  +2 radial pulses, +2 carotid pulses NEURO:  CN 2-12 grossly intact; motor and sensory grossly intact PSYCH:  No active depression or anxiety EXT:  No edema, ecchymosis, or cyanosis  Signed, Orbie Pyo, MD  05/20/2023 11:31 AM    [facility] [address] [place], [place], [place]  [address] Phone: [phone]; Fax: [phone]   Note:  This document was prepared using Dragon voice recognition software and may include unintentional dictation errors.

## 2023-05-19 NOTE — Progress Notes (Unsigned)
 Cardiology Office Note:   Date:  05/20/2023  ID:  Tim Walters, DOB 04/16/1945, MRN [id number] PCP:  [name], NP  [facility] Providers Cardiologist:  Alverda Skeans, MD Referring MD: [name], NP  Chief Complaint/Reason for Referral:  F/u for CAD ASSESSMENT:    1. Angina pectoris (HCC)   2. Hyperlipidemia LDL goal <70   3. Elevated lipoprotein(a)   4. Aortic atherosclerosis (HCC)   5. Essential hypertension, benign   6. CKD (chronic kidney disease) stage 2, GFR 60-89 ml/min   7. Prediabetes   8. Localized edema      PLAN:   In order of problems listed above: Angina pectoris: Continues to have lifestyle limiting angina despite medical therapy.  He did have 1 episode of chest pain at rest last week but nothing recently.  He has had occasional exertional angina but this is not routine.  He tells me that this is affecting his lifestyle.  Metoprolol, Imdur, and as needed nitroglycerin have not relieved his symptoms.  Will refer for coronary angiography and possible PCI for relief of symptoms.  I did tell him that if he develops severe chest pain at rest to proceed to the emergency department.  He understands this completely. Coronary artery disease: Continue aspirin 81 mg, atorvastatin 80 mg, Zetia 10 mg, Lopressor 25 mg twice daily. Hyperlipidemia: Continue atorvastatin 80 mg and Zetia 10 mg.  LDL was 33 recently.  Elevated LP(a): Continue atorvastatin 80 mg and Zetia 10 mg; LDL is 33.; target LDL less than 55. Aortic atherosclerosis: Continue aspirin 81 mg, atorvastatin 80 mg, and Zetia 10 mg.  Strict blood pressure control. Hypertension: Continue ramipril 20 mg daily.  Slightly above goal today. CKD stage II: Continue ramipril 20 mg daily and consider Jardiance in the future. Elevated BMI: Diet and exercise modification.  Hemoglobin A1c was 6.8  Prediabetes: Continue aspirin 81 mg, atorvastatin 80 mg, and ramipril now 20 mg.  Consider Jardiance in the  future for renal protection. Edema: Left lower extremity likely due to venous insufficiency.  I have asked the patient to continue with compression therapy.  Could refer for large artery ultrasound to evaluate further.            Dispo:  Return in about 2 weeks (around 06/03/2023).      Medication Adjustments/Labs and Tests Ordered: Current medicines are reviewed at length with the patient today.  Concerns regarding medicines are outlined above.  The following changes have been made:     Labs/tests ordered: Orders Placed This Encounter  Procedures   EKG 12-Lead    Medication Changes: No orders of the defined types were placed in this encounter.   Current medicines are reviewed at length with the patient today.  The patient does not have concerns regarding medicines.  I spent 33 minutes reviewing all clinical data during and prior to this visit including all relevant imaging studies, laboratories, clinical information from other health systems and prior notes from both Cardiology and other specialties, interviewing the patient, conducting a complete physical examination, and coordinating care in order to formulate a comprehensive and personalized evaluation and treatment plan.   History of Present Illness:      FOCUSED PROBLEM LIST:   CAD BMS RCA ACS 2003 Hypertension Hyperlipidemia LP(a) 225 Aortic atherosclerosis Chest CT 2024 CKD stage II BMI 32 Prediabetes  7/24:  The patient is a 78 y.o. male with the indicated medical history here for cardiology follow-up.  Initiation had been cared for  by Dr. [name] for a long time.  He was seen in our clinic in May with increasing fatigue and shortness of breath.  He was referred for nuclear stress test which demonstrated low risk findings.  Echocardiogram demonstrated preserved LV function with no significant valvular abnormalities and aortic dilatation with a diameter of about 40 mm.  Monitor demonstrated asymptomatic SVT and the  patient's Coreg was changed to metoprolol 25 mg twice daily.  His lipid panel in March showed an LDL of 60.   The patient tells me that he likes to walk.  About 8 months ago he was walking 3 to 4 miles a day.  Now he walks about a mile a day due to bilateral bursitis.  He is participating in PT to help with this.  Interestingly he will develop chest tightness for the first half mile of his walk.  Then during the second half mile this seems to remit.  He has noticed that he has low energy and is fatigued a lot of the day.  He has gained about 10 pounds over the last couple of years due to his hip issues which have made him a less active.  His wife says he does not snore however his Apple watch demonstrates fragmented sleep.  He does have days when he wakes up where he feels not well rested.  He fortunately has not required any emergency room visits or hospitalizations.  He is otherwise well without significant complaints.  Plan: Continue current medical therapy, check reflex TSH and LP(a).  2/25: In the interim the patient had a lipid panel which showed an LDL of 60 however his LP(a) was quite elevated.  Zetia was started and his LDL is now down to 39 when checked in September.  Patient is doing well.  He still develops stable angina after walking about a mile at the [facility].  This can be aborted if he takes it as needed nitroglycerin before he exerts himself.  He would like to start Imdur because of this.  He has not required nitroglycerin at any other times.  He has not had chest pain at rest.  This only occurs after he has been exerting himself and walking for about a mile.  He has had no issues with his atorvastatin.  He has had some issues with some postnasal drip specially in the morning.  He used to have seasonal allergies but is not sure about this now.  He denies any severe bleeding or myalgias or arthralgias in response to Lipitor.  He has not required any emergency room visits or hospitalizations.  He is  otherwise well and without significant complaints today.  Plan start Imdur 30 mg daily.  Check lipid panel, increase enalapril to 20 mg.  April 2025:  Patient consents to use of AI scribe. The patient was started on Imdur but did not experience any benefit in terms of decrease in anginal symptoms.  His LDL was 33. He tells me that he can walk a few laps and then he develops chest discomfort.  He will take a nitroglycerin and this will go away.  Sometimes he can walk 4 laps before this happens.  1 time last week she had an episode of chest pain at night.  He went to the bathroom and came back and he was still having this episode but then it resolved on its own.  He does not routinely get chest pain with any level of exertion or any specific level of exertion however  it is occurred quite frequently where he needs to stop and take a nitroglycerin.  He is typically a pretty active person and likes to exercise on a regular basis.  He tells me that this is affected his quality of life.            Current Medications: Current Meds  Medication Sig   aspirin 81 MG tablet Take 1 tablet (81 mg total) by mouth daily.   atorvastatin (LIPITOR) 80 MG tablet TAKE 1 TABLET BY MOUTH ONCE  DAILY   ezetimibe (ZETIA) 10 MG tablet TAKE 1 TABLET BY MOUTH DAILY   hydrochlorothiazide (MICROZIDE) 12.5 MG capsule TAKE 1 CAPSULE BY MOUTH DAILY   isosorbide mononitrate (IMDUR) 30 MG 24 hr tablet Take 1 tablet (30 mg total) by mouth at bedtime.   metoprolol tartrate (LOPRESSOR) 25 MG tablet TAKE 1 TABLET BY MOUTH TWICE  DAILY   Multiple Vitamin (MULTIVITAMIN WITH MINERALS) TABS tablet Take 1 tablet by mouth daily.   nitroGLYCERIN (NITROSTAT) 0.4 MG SL tablet Place 1 tablet (0.4 mg total) under the tongue every 5 (five) minutes as needed for chest pain.   OMEPRAZOLE PO Take 20 mg by mouth every morning.    ramipril (ALTACE) 10 MG capsule Take 2 capsules (20 mg total) by mouth daily.   sertraline (ZOLOFT) 50 MG tablet TAKE  1 TABLET BY MOUTH DAILY     Review of Systems:   Please see the history of present illness.    All other systems reviewed and are negative.     EKGs/Labs/Other Test Reviewed:   EKG: EKG from May 2024 demonstrates sinus rhythm with PACs  EKG Interpretation Date/Time:  Friday May 20 2023 10:39:07 EDT Ventricular Rate:  66 PR Interval:  168 QRS Duration:  84 QT Interval:  398 QTC Calculation: 417 R Axis:   -1  Text Interpretation: Normal sinus rhythm Minimal voltage criteria for LVH, may be normal variant ( R in aVL ) When compared with ECG of 02-Oct-2020 03:00, No significant change was found Confirmed by Alverda Skeans ([id number]) on 05/20/2023 10:52:45 AM         Risk Assessment/Calculations:          Physical Exam:   VS:  BP 132/78   Pulse 64   Ht 5\' 7"  (1.702 m)   Wt 210 lb (95.3 kg)   SpO2 98%   BMI 32.89 kg/m        Wt Readings from Last 3 Encounters:  05/20/23 210 lb (95.3 kg)  05/16/23 215 lb 3.2 oz (97.6 kg)  03/28/23 208 lb 12.8 oz (94.7 kg)      GENERAL:  No apparent distress, AOx3 HEENT:  No carotid bruits, +2 carotid impulses, no scleral icterus CAR: RRR  no murmurs, gallops, rubs, or thrills RES:  Clear to auscultation bilaterally ABD:  Soft, nontender, nondistended, positive bowel sounds x 4 VASC:  +2 radial pulses, +2 carotid pulses NEURO:  CN 2-12 grossly intact; motor and sensory grossly intact PSYCH:  No active depression or anxiety EXT:  No edema, ecchymosis, or cyanosis  Signed, Orbie Pyo, MD  05/20/2023 11:31 AM    [facility] [address] [place], [place], [place]  [address] Phone: [phone]; Fax: [phone]   Note:  This document was prepared using Dragon voice recognition software and may include unintentional dictation errors.

## 2023-05-20 ENCOUNTER — Ambulatory Visit (INDEPENDENT_AMBULATORY_CARE_PROVIDER_SITE_OTHER): Payer: Medicare Other | Admitting: Nurse Practitioner

## 2023-05-20 ENCOUNTER — Ambulatory Visit: Attending: Internal Medicine | Admitting: Internal Medicine

## 2023-05-20 ENCOUNTER — Encounter: Payer: Self-pay | Admitting: Nurse Practitioner

## 2023-05-20 ENCOUNTER — Encounter: Payer: Self-pay | Admitting: Internal Medicine

## 2023-05-20 VITALS — BP 130/64 | HR 74 | Temp 97.7°F | Resp 17 | Ht 67.0 in | Wt 214.8 lb

## 2023-05-20 VITALS — BP 132/78 | HR 64 | Ht 67.0 in | Wt 210.0 lb

## 2023-05-20 DIAGNOSIS — R739 Hyperglycemia, unspecified: Secondary | ICD-10-CM

## 2023-05-20 DIAGNOSIS — R600 Localized edema: Secondary | ICD-10-CM

## 2023-05-20 DIAGNOSIS — E785 Hyperlipidemia, unspecified: Secondary | ICD-10-CM | POA: Diagnosis not present

## 2023-05-20 DIAGNOSIS — E7841 Elevated Lipoprotein(a): Secondary | ICD-10-CM

## 2023-05-20 DIAGNOSIS — N182 Chronic kidney disease, stage 2 (mild): Secondary | ICD-10-CM

## 2023-05-20 DIAGNOSIS — I700 Atherosclerosis of aorta: Secondary | ICD-10-CM | POA: Diagnosis not present

## 2023-05-20 DIAGNOSIS — K219 Gastro-esophageal reflux disease without esophagitis: Secondary | ICD-10-CM | POA: Diagnosis not present

## 2023-05-20 DIAGNOSIS — I10 Essential (primary) hypertension: Secondary | ICD-10-CM

## 2023-05-20 DIAGNOSIS — I209 Angina pectoris, unspecified: Secondary | ICD-10-CM

## 2023-05-20 DIAGNOSIS — R7303 Prediabetes: Secondary | ICD-10-CM

## 2023-05-20 DIAGNOSIS — F325 Major depressive disorder, single episode, in full remission: Secondary | ICD-10-CM

## 2023-05-20 DIAGNOSIS — I25118 Atherosclerotic heart disease of native coronary artery with other forms of angina pectoris: Secondary | ICD-10-CM | POA: Diagnosis not present

## 2023-05-20 NOTE — Progress Notes (Signed)
 Careteam: Patient Care Team: [name], NP as PCP - General (Geriatric Medicine) Orbie Pyo, MD as PCP - Cardiology (Cardiology) Claud Kelp, MD as Consulting Physician (General Surgery) Teryl Lucy, MD as Consulting Physician (Orthopedic Surgery) Donzetta Starch, MD as Consulting Physician (Dermatology) York Spaniel, MD (Inactive) as Consulting Physician (Neurology) Golda Acre, OD (Optometry) [facility], [place] Orbie Pyo, MD as Consulting Physician (Cardiology)  PLACE OF SERVICE:  [facility]  Advanced Directive information    No Known Allergies  Chief Complaint  Patient presents with   Medical Management of Chronic Issues    6 month follow up.    Discussed the use of AI scribe software for clinical note transcription with the patient, who gave verbal consent to proceed.  History of Present Illness   Tim Walters is a 78 year old male with coronary artery disease who presents for a six-month follow-up due to chest pain.  He has been experiencing chest pain that restricts his activity, although he has not had any chest pain for the past three days. He saw his cardiologist this morning and plan for cardiac cath scheduled for next week. He has been advised to go to the ED if symptoms worsen.  He is currently taking for imdur and metoprolol to help manage chest pain without improvement. He is on statin for cholesterol management, with an LDL reported to be 33.   He is also on omeprazole for acid reflux and reports no indigestion.   he takes Zoloft 50 mg for mood and reports no current depression.  He mentions a history of ankle swelling that began about a week ago, worsening after being on his feet for about an hour and more pronounced by the end of the day. He has used compression stockings, which caused knee swelling, and has since stopped using them. Was advised to use thigh highs. He recalls experiencing similar swelling a  few years ago, which resolved after a few weeks. He is on hydrochlorothiazide which helps fluid management.  He mentions a decrease in physical activity due to chest pain, previously walking three to four miles a day, now reduced to about a mile three times a week.  His A1c has increased from 6.3 to 6.5, indicating a potential progression towards diabetes.  Review of Systems:  Review of Systems  Constitutional:  Negative for chills, fever and weight loss.  HENT:  Negative for tinnitus.   Respiratory:  Negative for cough, sputum production and shortness of breath.   Cardiovascular:  Positive for chest pain. Negative for palpitations and leg swelling.  Gastrointestinal:  Negative for abdominal pain, constipation, diarrhea and heartburn.  Genitourinary:  Negative for dysuria, frequency and urgency.  Musculoskeletal:  Negative for back pain, falls, joint pain and myalgias.  Skin: Negative.   Neurological:  Negative for dizziness and headaches.  Psychiatric/Behavioral:  Negative for depression and memory loss. The patient does not have insomnia.     Past Medical History:  Diagnosis Date   Arthritis    lt ankle   Breast lump    Per records from [facility]    Colon polyp    Coronary artery disease    a. BMS to RCA 2003 with residual LAD/diag disease treated medically, normal EF.   COVID    Depression    Diverticulosis    Per records from [facility]    GERD (gastroesophageal reflux disease)    Hyperkalemia    a. K of  5.2 in 2017.   Hyperlipidemia    Hypertension    Idiopathic peripheral neuropathy    Per records from [facility]    Impaired fasting glucose    Per records from [facility]    Kidney stones    Monoclonal gammopathy of undetermined significance    Per records from [facility]    Myocardial infarction [facility]) 11/15/2001   Dr. Verdis Prime [facility])   OSA (obstructive sleep apnea)    Per [facility] New Patient Packet   Peripheral  neuropathy    Left Foot, Per [facility] Patient Packet   Pre-diabetes    Sleep apnea    had test several yr ago-said he did not need a cpap-still snores   Torn rotator cuff    Per [facility] New Patient Packet   Venous insufficiency of left leg    Per [facility] New Patient Packet   Wears glasses    Past Surgical History:  Procedure Laterality Date   ANGIOPLASTY  2003   Per records from [facility]    BASAL CELL CARCINOMA EXCISION     left tricep   BASAL CELL CARCINOMA EXCISION  11/10/2022   right shoulder   CARDIAC CATHETERIZATION  23003   stent rca   CARDIOVASCULAR STRESS TEST  02/16/2016   Per records from [facility]    COLONOSCOPY  06/17/2003   Per records from [facility], Dr.Ganem to be repeated 2015   coronary artery stent  11/15/2001   CORONARY STENT PLACEMENT  11/15/2001   Per records from [place] [facility]    CYST REMOVAL TRUNK Left 05/08/2013   Procedure: CYST REMOVAL BACK;  Surgeon: Ernestene Mention, MD;  Location: [facility];  Service: General;  Laterality: Left;   INGUINAL HERNIA REPAIR Left 03/24/2015   Procedure: OPEN REPAIR LEFT INGUINAL HERNIA ;  Surgeon: Claud Kelp, MD;  Location: [facility];  Service: General;  Laterality: Left;   INSERTION OF MESH Left 03/24/2015   Procedure: INSERTION OF MESH;  Surgeon: Claud Kelp, MD;  Location: [facility];  Service: General;  Laterality: Left;   SHOULDER ARTHROSCOPY W/ ROTATOR CUFF REPAIR  2011   right   SPINE SURGERY  02/12/1993   L2, L3 fragmented disc   VASECTOMY  1987   VEIN REPAIR  09/2018   Vein injections    Social History:   reports that he quit smoking about 49 years ago. His smoking use included cigarettes. He started smoking about 61 years ago. He has never used smokeless tobacco. He reports current alcohol use of about 14.0 standard drinks of alcohol per week. He reports that he does not use drugs.  Family History  Problem Relation Age of Onset    Macular degeneration Mother    COPD Mother    Non-Hodgkin's lymphoma Mother    Lung cancer Mother        Per [facility] New Patient Packet    Hypertension Father    Suicidality Father 1       Per [facility] New Patient Packet    Depression Father    Angina Father        Per records from [facility]    Diverticulosis Father        Per records from [facility]    Heart disease Father    High Cholesterol Sister    Macular degeneration Maternal Grandmother    Schizophrenia Son    Bipolar disorder Son    Autism Son  Post-traumatic stress disorder Son    Diabetes type II Daughter    Breast cancer Neg Hx    Colon cancer Neg Hx    Esophageal cancer Neg Hx    Stomach cancer Neg Hx    Liver disease Neg Hx    Pancreatic cancer Neg Hx     Medications: Patient's Medications  New Prescriptions   No medications on file  Previous Medications   ASPIRIN 81 MG TABLET    Take 1 tablet (81 mg total) by mouth daily.   ATORVASTATIN (LIPITOR) 80 MG TABLET    TAKE 1 TABLET BY MOUTH ONCE  DAILY   EZETIMIBE (ZETIA) 10 MG TABLET    TAKE 1 TABLET BY MOUTH DAILY   HYDROCHLOROTHIAZIDE (MICROZIDE) 12.5 MG CAPSULE    TAKE 1 CAPSULE BY MOUTH DAILY   ISOSORBIDE MONONITRATE (IMDUR) 30 MG 24 HR TABLET    Take 1 tablet (30 mg total) by mouth at bedtime.   METOPROLOL TARTRATE (LOPRESSOR) 25 MG TABLET    TAKE 1 TABLET BY MOUTH TWICE  DAILY   MULTIPLE VITAMIN (MULTIVITAMIN WITH MINERALS) TABS TABLET    Take 1 tablet by mouth daily.   NITROGLYCERIN (NITROSTAT) 0.4 MG SL TABLET    Place 1 tablet (0.4 mg total) under the tongue every 5 (five) minutes as needed for chest pain.   OMEPRAZOLE PO    Take 20 mg by mouth every morning.    RAMIPRIL (ALTACE) 10 MG CAPSULE    Take 2 capsules (20 mg total) by mouth daily.   SERTRALINE (ZOLOFT) 50 MG TABLET    TAKE 1 TABLET BY MOUTH DAILY  Modified Medications   No medications on file  Discontinued Medications   No medications on file    Physical Exam:  Vitals:    05/20/23 1236  BP: 130/64  Pulse: 74  Resp: 17  Temp: 97.7 F (36.5 C)  SpO2: 92%  Weight: 214 lb 12.8 oz (97.4 kg)  Height: 5\' 7"  (1.702 m)   Body mass index is 33.64 kg/m. Wt Readings from Last 3 Encounters:  05/20/23 214 lb 12.8 oz (97.4 kg)  05/20/23 210 lb (95.3 kg)  05/16/23 215 lb 3.2 oz (97.6 kg)    Physical Exam Constitutional:      General: He is not in acute distress.    Appearance: He is well-developed. He is not diaphoretic.  HENT:     Head: Normocephalic and atraumatic.     Right Ear: External ear normal.     Left Ear: External ear normal.     Mouth/Throat:     Pharynx: No oropharyngeal exudate.  Eyes:     Conjunctiva/sclera: Conjunctivae normal.     Pupils: Pupils are equal, round, and reactive to light.  Cardiovascular:     Rate and Rhythm: Normal rate and regular rhythm.     Heart sounds: Normal heart sounds.  Pulmonary:     Effort: Pulmonary effort is normal.     Breath sounds: Normal breath sounds.  Abdominal:     General: Bowel sounds are normal.     Palpations: Abdomen is soft.  Musculoskeletal:        General: No tenderness.     Cervical back: Normal range of motion and neck supple.     Right lower leg: No edema.     Left lower leg: No edema.  Skin:    General: Skin is warm and dry.  Neurological:     Mental Status: He is alert and oriented to person,  place, and time.     Labs reviewed: Basic Metabolic Panel: Recent Labs    08/20/22 1603 11/09/22 0826 04/20/23 1059 05/17/23 0832  NA  --  139 139 140  K  --  4.2 4.5 4.1  CL  --  105 103 106  CO2  --  25 23 27   GLUCOSE  --  118* 108* 122*  BUN  --  21 26 26*  CREATININE  --  0.94 0.99 0.82  CALCIUM  --  9.2 9.1 9.0  TSH 2.350  --   --   --    Liver Function Tests: Recent Labs    11/09/22 0826 04/20/23 1103 05/17/23 0832  AST 22 24 19   ALT 26 33 21  ALKPHOS  --  84  --   BILITOT 0.8 0.5 0.7  PROT 6.6 6.5 6.1  ALBUMIN  --  4.0  --    No results for input(s):  "LIPASE", "AMYLASE" in the last 8760 hours. No results for input(s): "AMMONIA" in the last 8760 hours. CBC: Recent Labs    11/09/22 0826 05/17/23 0832  WBC 6.2 5.8  NEUTROABS 3,962 4,002  HGB 14.3 13.1*  HCT 43.6 40.6  MCV 95.4 96.0  PLT 185 178   Lipid Panel: Recent Labs    10/26/22 1009 04/20/23 1100 05/17/23 0832  CHOL 114 106 108  HDL 63 54 61  LDLCALC 39 41 33  TRIG 51 41 49  CHOLHDL 1.8 2.0 1.8   TSH: Recent Labs    08/20/22 1603  TSH 2.350   A1C: Lab Results  Component Value Date   HGBA1C 6.5 (H) 05/17/2023     Assessment/Plan Assessment and Plan    CAD Cardiac catheterization scheduled to prevent future cardiac events and improve activity levels. Followed by cardiology  - Advise to go to the ER if chest pain worsens before the procedure.  Edema Swelling in left ankle, worsens with prolonged standing. Compression stockings caused knee swelling. On hydrochlorothiazide. - Advise use of thigh-high compression stockings. - Recommend leg elevation when sitting.  Hypertension Blood pressure well-controlled with current medication regimen.  Hyperlipidemia LDL well-controlled with atorvastatin.  Pre-Diabetes A1c at 6.5, increased from 6.3. Advised on dietary changes to prevent progression to diabetes. - Advise dietary modifications to reduce carbohydrate and sugary food intake. - Recheck A1c in six months.   Major depressive disorder in full remission, unspecified whether recurrent (HCC) controlled on zoloft   Gastroesophageal reflux disease without esophagitis Stable on omeprazole.   aortic atherosclerosis (HCC) Continues on asa and statin    Return in about 6 months (around 11/19/2023) for routine follow up, labs prior to visit.:   Kmarion Rawl K. Biagio Borg [facility] [phone]

## 2023-05-20 NOTE — Patient Instructions (Addendum)
 Medication Instructions:  Your physician recommends that you continue on your current medications as directed. Please refer to the Current Medication list given to you today.  *If you need a refill on your cardiac medications before your next appointment, please call your pharmacy*  Follow-Up: At [facility], you and your health needs are our priority.  As part of our continuing mission to provide you with exceptional heart care, we have created designated Provider Care Teams.  These Care Teams include your primary Cardiologist (physician) and Advanced Practice Providers (APPs -  Physician Assistants and Nurse Practitioners) who all work together to provide you with the care you need, when you need it.  We recommend signing up for the patient portal called "MyChart".  Sign up information is provided on this After Visit Summary.  MyChart is used to connect with patients for Virtual Visits (Telemedicine).  Patients are able to view lab/test results, encounter notes, upcoming appointments, etc.  Non-urgent messages can be sent to your provider as well.   To learn more about what you can do with MyChart, go to [email or web address].    Your next appointment:   2-4 weeks after heart cath on 05/24/23  The format for your next appointment:   In Person  Provider:   Jari Favre, PA-C, Ronie Spies, PA-C, Robin Searing, NP, Tereso Newcomer, PA-C, or Perlie Gold, PA-C    Other Instructions   [facility] A DEPT OF [place]. [facility] AT [facility] [address] [place], [place] [address] [place] [address] Dept: [phone] Loc: [phone]  Tim Walters  05/20/2023  You are scheduled for a Cardiac Catheterization on Tuesday, April 8 with Dr. Peter [place].  1. Please arrive at the [facility] (Main Entrance A) at [facility]: [address] [place], [place] [address] at 7:00 AM (This time is 2 hour(s) before your procedure to ensure your  preparation).   Free valet parking service is available. You will check in at ADMITTING. The support person will be asked to wait in the waiting room.  It is OK to have someone drop you off and come back when you are ready to be discharged.    Special note: Every effort is made to have your procedure done on time. Please understand that emergencies sometimes delay scheduled procedures.  2. Diet: Do not eat solid foods after midnight.  The patient may have clear liquids until 5am upon the day of the procedure.  3. Labs: Completed  4. Medication instructions in preparation for your procedure:  HOLD hydrochlorothiazide the day of the procedure.   Contrast Allergy: No  On the morning of your procedure, take your Aspirin 81 mg and any morning medicines NOT listed above.  You may use sips of water.  5. Plan to go home the same day, you will only stay overnight if medically necessary. 6. Bring a current list of your medications and current insurance cards. 7. You MUST have a responsible person to drive you home. 8. Someone MUST be with you the first 24 hours after you arrive home or your discharge will be delayed. 9. Please wear clothes that are easy to get on and off and wear slip-on shoes.  Thank you for allowing [place] to care for you!   -- [place] [facility]     1st Floor: - Lobby - Registration  - Pharmacy  - Lab - Cafe  2nd Floor: - PV Lab - Diagnostic Testing (echo,  CT, nuclear med)  3rd Floor: - Vacant  4th Floor: - TCTS (cardiothoracic surgery) - AFib Clinic - Structural Heart Clinic - Vascular Surgery  - Vascular Ultrasound  5th Floor: - [facility] (general and EP) - Clinical Pharmacy for coumadin, hypertension, lipid, weight-loss medications, and med management appointments    Valet parking services will be available as well.

## 2023-05-23 ENCOUNTER — Telehealth: Payer: Self-pay | Admitting: *Deleted

## 2023-05-23 NOTE — Telephone Encounter (Signed)
 Cardiac Catheterization scheduled at [facility] for: Tuesday May 24, 2023 9 AM Arrival time [facility] Main Entrance A at: 7 AM  Nothing to eat after midnight prior to procedure, clear liquids until 5 AM day of procedure.  Medication instructions: -Hold:  Hydrochlorothiazide-AM of procedure -Other usual morning medications can be taken with sips of water including aspirin 81 mg.  Plan to go home the same day, you will only stay overnight if medically necessary.  You must have responsible adult to drive you home.  Someone must be with you the first 24 hours after you arrive home.   Reviewed procedure instructions with patient.

## 2023-05-24 ENCOUNTER — Encounter (HOSPITAL_COMMUNITY): Payer: Self-pay | Admitting: Cardiology

## 2023-05-24 ENCOUNTER — Ambulatory Visit (HOSPITAL_COMMUNITY)
Admission: RE | Admit: 2023-05-24 | Discharge: 2023-05-24 | Disposition: A | Discharge status: Home / Self Care | Attending: Cardiology | Admitting: Cardiology

## 2023-05-24 ENCOUNTER — Other Ambulatory Visit: Payer: Self-pay

## 2023-05-24 ENCOUNTER — Encounter (HOSPITAL_COMMUNITY)
Admission: RE | Disposition: A | Discharge status: Home / Self Care | Payer: Self-pay | Source: Home / Self Care | Attending: Cardiology

## 2023-05-24 ENCOUNTER — Ambulatory Visit (HOSPITAL_BASED_OUTPATIENT_CLINIC_OR_DEPARTMENT_OTHER)

## 2023-05-24 DIAGNOSIS — N182 Chronic kidney disease, stage 2 (mild): Secondary | ICD-10-CM | POA: Diagnosis not present

## 2023-05-24 DIAGNOSIS — R600 Localized edema: Secondary | ICD-10-CM | POA: Insufficient documentation

## 2023-05-24 DIAGNOSIS — I700 Atherosclerosis of aorta: Secondary | ICD-10-CM | POA: Diagnosis not present

## 2023-05-24 DIAGNOSIS — Z79899 Other long term (current) drug therapy: Secondary | ICD-10-CM | POA: Insufficient documentation

## 2023-05-24 DIAGNOSIS — Z7982 Long term (current) use of aspirin: Secondary | ICD-10-CM | POA: Insufficient documentation

## 2023-05-24 DIAGNOSIS — E785 Hyperlipidemia, unspecified: Secondary | ICD-10-CM | POA: Diagnosis not present

## 2023-05-24 DIAGNOSIS — R7303 Prediabetes: Secondary | ICD-10-CM | POA: Diagnosis not present

## 2023-05-24 DIAGNOSIS — I129 Hypertensive chronic kidney disease with stage 1 through stage 4 chronic kidney disease, or unspecified chronic kidney disease: Secondary | ICD-10-CM | POA: Insufficient documentation

## 2023-05-24 DIAGNOSIS — I2584 Coronary atherosclerosis due to calcified coronary lesion: Secondary | ICD-10-CM | POA: Insufficient documentation

## 2023-05-24 DIAGNOSIS — E7841 Elevated Lipoprotein(a): Secondary | ICD-10-CM | POA: Diagnosis not present

## 2023-05-24 DIAGNOSIS — I25118 Atherosclerotic heart disease of native coronary artery with other forms of angina pectoris: Secondary | ICD-10-CM | POA: Diagnosis not present

## 2023-05-24 DIAGNOSIS — I2510 Atherosclerotic heart disease of native coronary artery without angina pectoris: Secondary | ICD-10-CM | POA: Diagnosis not present

## 2023-05-24 DIAGNOSIS — Z955 Presence of coronary angioplasty implant and graft: Secondary | ICD-10-CM | POA: Diagnosis not present

## 2023-05-24 DIAGNOSIS — I252 Old myocardial infarction: Secondary | ICD-10-CM | POA: Diagnosis not present

## 2023-05-24 HISTORY — PX: LEFT HEART CATH AND CORONARY ANGIOGRAPHY: CATH118249

## 2023-05-24 LAB — ECHOCARDIOGRAM COMPLETE
AR max vel: 4.54 cm2
AV Area VTI: 4.75 cm2
AV Area mean vel: 4.42 cm2
AV Mean grad: 6 mmHg
AV Peak grad: 10.8 mmHg
Ao pk vel: 1.64 m/s
Area-P 1/2: 2.56 cm2
Est EF: >75
Height: 67 in
S' Lateral: 2.3 cm
Weight: 3392 [oz_av]

## 2023-05-24 LAB — GLUCOSE, CAPILLARY: Glucose-Capillary: 112 mg/dL — ABNORMAL HIGH (ref 70–99)

## 2023-05-24 SURGERY — LEFT HEART CATH AND CORONARY ANGIOGRAPHY
Anesthesia: LOCAL

## 2023-05-24 MED ORDER — MIDAZOLAM HCL 2 MG/2ML IJ SOLN
INTRAMUSCULAR | Status: AC
  Filled 2023-05-24: qty 2

## 2023-05-24 MED ORDER — FENTANYL CITRATE (PF) 100 MCG/2ML IJ SOLN
INTRAMUSCULAR | Status: AC
  Filled 2023-05-24: qty 2

## 2023-05-24 MED ORDER — MIDAZOLAM HCL 2 MG/2ML IJ SOLN
INTRAMUSCULAR | Status: DC | PRN
  Administered 2023-05-24: 1 mg via INTRAVENOUS

## 2023-05-24 MED ORDER — VERAPAMIL HCL 2.5 MG/ML IV SOLN
INTRAVENOUS | Status: DC | PRN
  Administered 2023-05-24 (×2): 8 mL via INTRA_ARTERIAL

## 2023-05-24 MED ORDER — HEPARIN SODIUM (PORCINE) 1000 UNIT/ML IJ SOLN
INTRAMUSCULAR | Status: AC
  Filled 2023-05-24: qty 10

## 2023-05-24 MED ORDER — SODIUM CHLORIDE 0.9% FLUSH: 3.0000 mL | INTRAVENOUS | Status: DC | PRN

## 2023-05-24 MED ORDER — SODIUM CHLORIDE 0.9 % IV SOLN: 250.0000 mL | INTRAVENOUS | Status: DC | PRN

## 2023-05-24 MED ORDER — LIDOCAINE HCL (PF) 1 % IJ SOLN
INTRAMUSCULAR | Status: DC | PRN
  Administered 2023-05-24: 2 mL

## 2023-05-24 MED ORDER — HEPARIN SODIUM (PORCINE) 1000 UNIT/ML IJ SOLN
INTRAMUSCULAR | Status: DC | PRN
  Administered 2023-05-24: 5000 [IU] via INTRAVENOUS

## 2023-05-24 MED ORDER — LIDOCAINE HCL (PF) 1 % IJ SOLN
INTRAMUSCULAR | Status: AC
  Filled 2023-05-24: qty 30

## 2023-05-24 MED ORDER — VERAPAMIL HCL 2.5 MG/ML IV SOLN
INTRAVENOUS | Status: AC
  Filled 2023-05-24: qty 2

## 2023-05-24 MED ORDER — SODIUM CHLORIDE 0.9 % WEIGHT BASED INFUSION: 1.0000 mL/kg/h | INTRAVENOUS | Status: DC

## 2023-05-24 MED ORDER — SODIUM CHLORIDE 0.9 % WEIGHT BASED INFUSION: 3.0000 mL/kg/h | INTRAVENOUS | Status: AC

## 2023-05-24 MED ORDER — ONDANSETRON HCL 4 MG/2ML IJ SOLN
4.0000 mg | Freq: Four times a day (QID) | INTRAMUSCULAR | Status: DC | PRN
Start: 2023-05-24 — End: 2023-05-24

## 2023-05-24 MED ORDER — SODIUM CHLORIDE 0.9% FLUSH: 3.0000 mL | Freq: Two times a day (BID) | INTRAVENOUS | Status: DC

## 2023-05-24 MED ORDER — IOHEXOL 350 MG/ML SOLN
INTRAVENOUS | Status: DC | PRN
  Administered 2023-05-24: 70 mL

## 2023-05-24 MED ORDER — ACETAMINOPHEN 325 MG PO TABS: 650.0000 mg | ORAL_TABLET | ORAL | Status: DC | PRN

## 2023-05-24 MED ORDER — FENTANYL CITRATE (PF) 100 MCG/2ML IJ SOLN
INTRAMUSCULAR | Status: DC | PRN
  Administered 2023-05-24: 25 ug via INTRAVENOUS

## 2023-05-24 MED ORDER — HEPARIN (PORCINE) IN NACL 1000-0.9 UT/500ML-% IV SOLN
INTRAVENOUS | Status: DC | PRN
  Administered 2023-05-24: 1000 mL

## 2023-05-24 MED ORDER — PERFLUTREN LIPID MICROSPHERE
1.0000 mL | INTRAVENOUS | Status: DC | PRN
  Administered 2023-05-24: 2 mL via INTRAVENOUS

## 2023-05-24 MED ORDER — ASPIRIN 81 MG PO CHEW: 81.0000 mg | CHEWABLE_TABLET | ORAL | Status: DC

## 2023-05-24 SURGICAL SUPPLY — 12 items
CATH 5FR JL3.5 JR4 ANG PIG MP (CATHETERS) IMPLANT
CATH INFINITI AMBI 5FR TG (CATHETERS) IMPLANT
CATH LAUNCHER 6FR AL.75 (CATHETERS) IMPLANT
COVER PRB 48X5XTLSCP FOLD TPE (BAG) IMPLANT
DEVICE RAD COMP TR BAND LRG (VASCULAR PRODUCTS) IMPLANT
GLIDESHEATH SLEND SS 6F .021 (SHEATH) IMPLANT
GUIDEWIRE INQWIRE 1.5J.035X260 (WIRE) IMPLANT
INQWIRE 1.5J .035X260CM (WIRE) ×1 IMPLANT
KIT SINGLE USE MANIFOLD (KITS) IMPLANT
PACK CARDIAC CATHETERIZATION (CUSTOM PROCEDURE TRAY) ×1 IMPLANT
SET ATX-X65L (MISCELLANEOUS) IMPLANT
SHEATH 6FR 85 DEST SLENDER (SHEATH) IMPLANT

## 2023-05-24 NOTE — Discharge Instructions (Signed)

## 2023-05-24 NOTE — Interval H&P Note (Signed)
 History and Physical Interval Note:  05/24/2023 8:54 AM  Tim Walters  has presented today for surgery, with the diagnosis of chest pain.  The various methods of treatment have been discussed with the patient and family. After consideration of risks, benefits and other options for treatment, the patient has consented to  Procedure(s): LEFT HEART CATH AND CORONARY ANGIOGRAPHY (N/A) as a surgical intervention.  [name] history has been reviewed, patient examined, no change in status, stable for surgery.  I have reviewed the patient's chart and labs.  Questions were answered to the patient's satisfaction.   Cath Lab Visit (complete for each Cath Lab visit)  Clinical Evaluation Leading to the Procedure:   ACS: No.  Non-ACS:    Anginal Classification: CCS III  Anti-ischemic medical therapy: Maximal Therapy (2 or more classes of medications)  Non-Invasive Test Results: No non-invasive testing performed  Prior CABG: No previous CABG        Theron Arista [facility] 05/24/2023 8:55 AM

## 2023-05-27 ENCOUNTER — Encounter: Admitting: Thoracic Surgery (Cardiothoracic Vascular Surgery)

## 2023-06-01 ENCOUNTER — Encounter: Admitting: Surgery

## 2023-06-06 ENCOUNTER — Encounter: Payer: Self-pay | Admitting: Surgery

## 2023-06-06 ENCOUNTER — Institutional Professional Consult (permissible substitution): Admitting: Surgery

## 2023-06-06 ENCOUNTER — Other Ambulatory Visit: Payer: Self-pay | Admitting: *Deleted

## 2023-06-06 VITALS — BP 165/85 | HR 100 | Resp 20 | Ht 67.0 in | Wt 212.0 lb

## 2023-06-06 DIAGNOSIS — I2510 Atherosclerotic heart disease of native coronary artery without angina pectoris: Secondary | ICD-10-CM

## 2023-06-06 NOTE — Progress Notes (Signed)
 Cardiothoracic Surgery Consultation  PCP is Roselie Conger, Champ Coma, NP Referring Provider is [place], Peter M, MD Primary Cardiologist: [name], MD  Chief Complaint  Patient presents with   Coronary Artery Disease    Cardiac Cath  and ECHO 05/24/23    HPI:  The patient is a 78 year old gentleman with a history of hypertension, hyperlipidemia, stage II chronic kidney disease, prediabetes, OSA, and coronary artery disease status post RCA stenting in the past who was referred for consideration of CABG.  He was followed by Dr. Kay Parson for a number years and is now followed by Dr. Lorie Rook.  He reports having a 38-month history of intermittent exertional chest tightness that has progressed to the point where he is having episodes at rest that respond to nitroglycerin .  He has also had decreased stamina and exertional shortness of breath.  Cardiac catheterization on 05/24/2023 showed a 90% distal left main stenosis with haziness to the bifurcation.  There was 70% proximal to mid LAD stenosis.  The first diagonal branch had about 50% stenosis.  There is a large left circumflex without disease.  The RCA was diffusely diseased throughout its proximal and midportion to 70%.  Left ventricular ejection fraction was normal with normal LVEDP.  2D echocardiogram on 05/24/2023 showed a hyperdynamic left ventricle with ejection fraction greater than 75%.  There is grade 1 diastolic dysfunction.  There is no significant valvular abnormality.  He is retired and here today with his wife.  He has tried to remain active but has been very limited by substernal chest discomfort.  He reports having vein injections in both lower legs for varicosities in the past. Past Medical History:  Diagnosis Date   Arthritis    lt ankle   Breast lump    Per records from [facility]    Colon polyp    Coronary artery disease    a. BMS to RCA 2003 with residual LAD/diag disease treated medically, normal EF.   COVID     Depression    Diverticulosis    Per records from [facility]    GERD (gastroesophageal reflux disease)    Hyperkalemia    a. K of 5.2 in 2017.   Hyperlipidemia    Hypertension    Idiopathic peripheral neuropathy    Per records from [facility]    Impaired fasting glucose    Per records from [facility]    Kidney stones    Monoclonal gammopathy of undetermined significance    Per records from [facility]    Myocardial infarction [facility]) 11/15/2001   Dr. Kay Parson [facility])   OSA (obstructive sleep apnea)    Per [facility] New Patient Packet   Peripheral neuropathy    Left Foot, Per [facility] Patient Packet   Pre-diabetes    Sleep apnea    had test several yr ago-said he did not need a cpap-still snores   Torn rotator cuff    Per [facility] New Patient Packet   Venous insufficiency of left leg    Per [facility] New Patient Packet   Wears glasses     Past Surgical History:  Procedure Laterality Date   ANGIOPLASTY  2003   Per records from [facility]    BASAL CELL CARCINOMA EXCISION     left tricep   BASAL CELL CARCINOMA EXCISION  11/10/2022   right shoulder   CARDIAC CATHETERIZATION  23003   stent rca   CARDIOVASCULAR STRESS TEST  02/16/2016   Per records from  [facility]    COLONOSCOPY  06/17/2003   Per records from [facility], Dr.Ganem to be repeated 2015   coronary artery stent  11/15/2001   CORONARY STENT PLACEMENT  11/15/2001   Per records from [facility]    CYST REMOVAL TRUNK Left 05/08/2013   Procedure: CYST REMOVAL BACK;  Surgeon: Levert Ready, MD;  Location: [facility];  Service: General;  Laterality: Left;   INGUINAL HERNIA REPAIR Left 03/24/2015   Procedure: OPEN REPAIR LEFT INGUINAL HERNIA ;  Surgeon: Boyce Byes, MD;  Location: [facility];  Service: General;  Laterality: Left;   INSERTION OF MESH Left 03/24/2015   Procedure: INSERTION OF MESH;  Surgeon: Boyce Byes, MD;  Location: [facility];  Service: General;  Laterality: Left;   LEFT HEART CATH AND CORONARY ANGIOGRAPHY N/A 05/24/2023   Procedure: LEFT HEART CATH AND CORONARY ANGIOGRAPHY;  Surgeon: [place], Peter M, MD;  Location: [facility] INVASIVE CV LAB;  Service: Cardiovascular;  Laterality: N/A;   SHOULDER ARTHROSCOPY W/ ROTATOR CUFF REPAIR  2011   right   SPINE SURGERY  02/12/1993   L2, L3 fragmented disc   VASECTOMY  1987   VEIN REPAIR  09/2018   Vein injections     Family History  Problem Relation Age of Onset   Macular degeneration Mother    COPD Mother    Non-Hodgkin's lymphoma Mother    Lung cancer Mother        Per [facility] New Patient Packet    Hypertension Father    Suicidality Father 18       Per [facility] New Patient Packet    Depression Father    Angina Father        Per records from [facility]    Diverticulosis Father        Per records from [facility]    Heart disease Father    High Cholesterol Sister    Macular degeneration Maternal Grandmother    Schizophrenia Son    Bipolar disorder Son    Autism Son    Post-traumatic stress disorder Son    Diabetes type II Daughter    Breast cancer Neg Hx    Colon cancer Neg Hx    Esophageal cancer Neg Hx    Stomach cancer Neg Hx    Liver disease Neg Hx    Pancreatic cancer Neg Hx     Social History Social History   Tobacco Use   Smoking status: Former    Current packs/day: 0.00    Types: Cigarettes    Start date: 02/15/1962    Quit date: 02/15/1974    Years since quitting: 49.3   Smokeless tobacco: Never  Vaping Use   Vaping status: Never Used  Substance Use Topics   Alcohol use: Yes    Alcohol/week: 14.0 standard drinks of alcohol    Types: 14 Standard drinks or equivalent per week    Comment: 14 drinks weekly in the evening    Drug use: No    Current Outpatient Medications  Medication Sig Dispense Refill   aspirin  81 MG tablet Take 1 tablet (81 mg total) by mouth daily.     atorvastatin  (LIPITOR) 80 MG tablet TAKE 1  TABLET BY MOUTH ONCE  DAILY 90 tablet 3   ezetimibe  (ZETIA ) 10 MG tablet TAKE 1 TABLET BY MOUTH DAILY 90 tablet 3   hydrochlorothiazide  (MICROZIDE ) 12.5 MG capsule TAKE 1 CAPSULE BY MOUTH DAILY 90 capsule 3   isosorbide   mononitrate (IMDUR ) 30 MG 24 hr tablet Take 1 tablet (30 mg total) by mouth at bedtime. 90 tablet 3   metoprolol  tartrate (LOPRESSOR ) 25 MG tablet TAKE 1 TABLET BY MOUTH TWICE  DAILY 180 tablet 3   Multiple Vitamin (MULTIVITAMIN WITH MINERALS) TABS tablet Take 1 tablet by mouth daily.     nitroGLYCERIN  (NITROSTAT ) 0.4 MG SL tablet Place 1 tablet (0.4 mg total) under the tongue every 5 (five) minutes as needed for chest pain. 25 tablet 6   omeprazole (PRILOSEC) 20 MG capsule Take 20 mg by mouth daily.     ramipril  (ALTACE ) 10 MG capsule Take 2 capsules (20 mg total) by mouth daily. 180 capsule 3   sertraline  (ZOLOFT ) 50 MG tablet TAKE 1 TABLET BY MOUTH DAILY 90 tablet 3   No current facility-administered medications for this visit.   Facility-Administered Medications Ordered in Other Visits  Medication Dose Route Frequency Provider Last Rate Last Admin   regadenoson  (LEXISCAN ) injection SOLN 0.4 mg  0.4 mg Intravenous Once Dick, Ernest H Jr., NP        No Known Allergies  Review of Systems  Constitutional:  Positive for activity change and fatigue.  HENT: Negative.    Eyes: Negative.   Respiratory:  Positive for shortness of breath.   Cardiovascular:  Positive for chest pain and leg swelling.  Gastrointestinal: Negative.   Endocrine: Negative.   Genitourinary: Negative.   Musculoskeletal: Negative.   Allergic/Immunologic: Negative.   Neurological:  Negative for dizziness and syncope.  Hematological: Negative.   Psychiatric/Behavioral: Negative.      BP (!) 165/85   Pulse 100   Resp 20   Ht 5\' 7"  (1.702 m)   Wt 212 lb (96.2 kg)   SpO2 95% Comment: RA  BMI 33.20 kg/m  Physical Exam Constitutional:      Appearance: He is obese.  HENT:     Head:  Normocephalic and atraumatic.  Eyes:     Extraocular Movements: Extraocular movements intact.     Conjunctiva/sclera: Conjunctivae normal.     Pupils: Pupils are equal, round, and reactive to light.  Neck:     Vascular: No carotid bruit.  Cardiovascular:     Rate and Rhythm: Normal rate and regular rhythm.     Pulses: Normal pulses.     Heart sounds: Normal heart sounds. No murmur heard. Pulmonary:     Effort: Pulmonary effort is normal.     Breath sounds: Normal breath sounds.  Musculoskeletal:        General: No swelling.  Skin:    General: Skin is warm and dry.  Neurological:     General: No focal deficit present.     Mental Status: He is alert and oriented to person, place, and time.  Psychiatric:        Mood and Affect: Mood normal.        Behavior: Behavior normal.    Diagnostic Tests:  Physicians  Panel Physicians Referring Physician Case Authorizing Physician  [place], Peter M, MD (Primary)     Procedures  LEFT HEART CATH AND CORONARY ANGIOGRAPHY   Conclusion      Prox LAD to Mid LAD lesion is 70% stenosed.   Mid LM to Dist LM lesion is 90% stenosed.   1st Diag lesion is 50% stenosed.   Prox RCA to Mid RCA lesion is 70% stenosed.   The left ventricular systolic function is normal.   LV end diastolic pressure is normal.   The left ventricular ejection  fraction is 55-65% by visual estimate.   Left main and severe 2 vessel CAD Normal LV function Normal LVEDP     Plan: will refer to CT surgery for CABG   Indications  Coronary artery disease of native artery of native heart with stable angina pectoris (HCC) [Q65.784 (ICD-10-CM)]   Procedural Details  Technical Details Indication: 78 yo WM with remote stenting of the RCA in 2003 for STEMI. Presents with progressive exertional angina despite good medical therapy  Procedural Details: The right wrist was prepped, draped, and anesthetized with 1% lidocaine . Ultrasound was used to guide access. Image was  obtained and stored in the record.  Using the modified Seldinger technique, a 6 French slender sheath was introduced into the right radial artery. 3 mg of verapamil  was administered through the sheath, weight-based unfractionated heparin  was administered intravenously. Due to tortuosity of the innominate a Destination sheath was used in order to image the RCA.  Standard Judkins catheters were used for selective coronary angiography and left ventriculography. A LA 0.75 was used to image the RCA. Catheter exchanges were performed over an exchange length guidewire. There were no immediate procedural complications. A TR band was used for radial hemostasis at the completion of the procedure.  The patient was transferred to the post catheterization recovery area for further monitoring.  Contrast: 70 cc Estimated blood loss <50 mL.   During this procedure medications were administered to achieve and maintain moderate conscious sedation while the patient's heart rate, blood pressure, and oxygen saturation were continuously monitored and I was present face-to-face 100% of this time. Algie Antis RN and Tommi Fraise Cardiovascular Specialist  [facility] Cardiovascular Specialist are independent, trained observers who assisted in the monitoring of the patient's level of consciousness.   Medications (Filter: Administrations occurring from 0901 to 1012 on 05/24/23) Heparin  (Porcine) in NaCl 1000-0.9 UT/500ML-% SOLN (mL)  Total volume: 1,000 mL Date/Time Rate/Dose/Volume Action   05/24/23 0910 1,000 mL Given   midazolam  (VERSED ) injection (mg)  Total dose: 1 mg Date/Time Rate/Dose/Volume Action   05/24/23 0921 1 mg Given   fentaNYL  (SUBLIMAZE ) injection (mcg)  Total dose: 25 mcg Date/Time Rate/Dose/Volume Action   05/24/23 0921 25 mcg Given   lidocaine  (PF) (XYLOCAINE ) 1 % injection (mL)  Total volume: 2 mL Date/Time Rate/Dose/Volume Action   05/24/23 0922 2 mL Given   Radial Cocktail/Verapamil   only (mL)  Total volume: 16 mL Date/Time Rate/Dose/Volume Action   05/24/23 0922 8 mL Given   0949 8 mL Given   heparin  sodium (porcine) injection (Units)  Total dose: 5,000 Units Date/Time Rate/Dose/Volume Action   05/24/23 0925 5,000 Units Given   iohexol  (OMNIPAQUE ) 350 MG/ML injection (mL)  Total volume: 70 mL Date/Time Rate/Dose/Volume Action   05/24/23 1005 70 mL Given    Sedation Time  Sedation Time Physician-1: 41 minutes 33 seconds Contrast     Administrations occurring from 0901 to 1012 on 05/24/23:  Medication Name Total Dose  iohexol  (OMNIPAQUE ) 350 MG/ML injection 70 mL   Radiation/Fluoro  Fluoro time: 16.7 (min) DAP: 29301 (mGycm2) Cumulative Air Kerma: 432 (mGy) Complications  Complications documented before study signed (05/24/2023 10:13 AM)   No complications were associated with this study.  Documented by Apolinar Klippel - 05/24/2023 10:06 AM     Coronary Findings  Diagnostic Dominance: Right Left Main  Mid LM to Dist LM lesion is 90% stenosed. The lesion is calcified.    Left Anterior Descending  Prox LAD to Mid LAD lesion is 70% stenosed.  First Diagonal Branch  1st Diag lesion is 50% stenosed.    Left Circumflex    Second Obtuse Marginal Branch  Vessel is large in size.    Right Coronary Artery  Prox RCA to Mid RCA lesion is 70% stenosed. The lesion was previously treated .    Intervention   No interventions have been documented.   Left Heart  Left Ventricle The left ventricular size is normal. The left ventricular systolic function is normal. LV end diastolic pressure is normal. The left ventricular ejection fraction is 55-65% by visual estimate. No regional wall motion abnormalities.   Coronary Diagrams  Diagnostic Dominance: Right  Intervention   Implants   No implant documentation for this case.   Syngo Images   Show images for CARDIAC CATHETERIZATION Images on Long Term Storage   Show images for K[place]on, Statler to Procedure Log  Procedure Log    Hemo Data  Flowsheet Row Most Recent Value  AO Systolic Pressure 79 mmHg  AO Diastolic Pressure 78 mmHg  AO Mean 78 mmHg  LV Systolic Pressure 106 mmHg  LV Diastolic Pressure 7 mmHg  LV EDP 15 mmHg  Arterial Occlusion Pressure Extended Systolic Pressure 102 mmHg  Arterial Occlusion Pressure Extended Diastolic Pressure 54 mmHg  Arterial Occlusion Pressure Extended Mean Pressure 71 mmHg  Left Ventricular Apex Extended Systolic Pressure 110 mmHg  LVp Diastolic Pressure 31 mmHg  Left Ventricular Apex Extended EDP Pressure 20 mmHg   ECHOCARDIOGRAM REPORT       Patient Name:   Tim Walters Date of Exam: 05/24/2023  Medical Rec #:  [id number]          Height:       67.0 in  Accession #:    [id number]         Weight:       212.0 lb  Date of Birth:  [birth year]         BSA:          2.073 m  Patient Age:    77 years           BP:           119/65 mmHg  Patient Gender: M                  HR:           64 bpm.  Exam Location:  Inpatient   Procedure: 2D Echo, Cardiac Doppler, Color Doppler and Intracardiac             Opacification Agent (Both Spectral and Color Flow Doppler were             utilized during procedure).   Indications:    CAD    History:        Patient has prior history of Echocardiogram examinations,  most                 recent 07/28/2022. Risk Factors:Dyslipidemia, Hypertension  and                 Sleep Apnea.    Sonographer:    Meagan Baucom RDCS, FE, PE  Referring Phys: [id number] PETER M [place]   IMPRESSIONS     1. Left ventricular ejection fraction, by estimation, is >75%. The left  ventricle has hyperdynamic function. The left ventricle has no regional  wall motion abnormalities. Left ventricular diastolic parameters are  consistent with Grade I diastolic  dysfunction (impaired relaxation).  2. Right ventricular systolic function is normal. The right ventricular  size is normal.   3. Left atrial size was  mildly dilated.   4. The mitral valve is normal in structure. No evidence of mitral valve  regurgitation. No evidence of mitral stenosis.   5. The aortic valve is tricuspid. There is mild thickening of the aortic  valve. Aortic valve regurgitation is not visualized. Aortic valve  sclerosis is present, with no evidence of aortic valve stenosis.   6. The inferior vena cava is normal in size with greater than 50%  respiratory variability, suggesting right atrial pressure of 3 mmHg.   Comparison(s): No significant change from prior study. Prior images  reviewed side by side.   FINDINGS   Left Ventricle: Left ventricular ejection fraction, by estimation, is  >75%. The left ventricle has hyperdynamic function. The left ventricle has  no regional wall motion abnormalities. Strain was performed and the global  longitudinal strain is  indeterminate. The left ventricular internal cavity size was normal in  size. There is no left ventricular hypertrophy. Left ventricular diastolic  parameters are consistent with Grade I diastolic dysfunction (impaired  relaxation).   Right Ventricle: The right ventricular size is normal. No increase in  right ventricular wall thickness. Right ventricular systolic function is  normal.   Left Atrium: Left atrial size was mildly dilated.   Right Atrium: Right atrial size was normal in size.   Pericardium: There is no evidence of pericardial effusion.   Mitral Valve: The mitral valve is normal in structure. No evidence of  mitral valve regurgitation. No evidence of mitral valve stenosis.   Tricuspid Valve: The tricuspid valve is normal in structure. Tricuspid  valve regurgitation is not demonstrated. No evidence of tricuspid  stenosis.   Aortic Valve: The aortic valve is tricuspid. There is mild thickening of  the aortic valve. There is mild aortic valve annular calcification. Aortic  valve regurgitation is not visualized. Aortic valve sclerosis is present,   with no evidence of aortic valve   stenosis. Aortic valve mean gradient measures 6.0 mmHg. Aortic valve peak  gradient measures 10.8 mmHg. Aortic valve area, by VTI measures 4.75 cm.   Pulmonic Valve: The pulmonic valve was not well visualized. Pulmonic valve  regurgitation is not visualized. No evidence of pulmonic stenosis.   Aorta: The aortic root and ascending aorta are structurally normal, with  no evidence of dilitation.   Venous: The inferior vena cava is normal in size with greater than 50%  respiratory variability, suggesting right atrial pressure of 3 mmHg.   IAS/Shunts: The atrial septum is grossly normal.   Additional Comments: 3D was performed not requiring image post processing  on an independent workstation and was indeterminate.     LEFT VENTRICLE  PLAX 2D  LVIDd:         4.20 cm   Diastology  LVIDs:         2.30 cm   LV e' medial:    5.91 cm/s  LV PW:         1.10 cm   LV E/e' medial:  13.0  LV IVS:        0.90 cm   LV e' lateral:   11.80 cm/s  LVOT diam:     2.70 cm   LV E/e' lateral: 6.5  LV SV:         165  LV SV Index:   80  LVOT Area:     5.73 cm  RIGHT VENTRICLE  RV Basal diam:  3.70 cm  RV Mid diam:    3.10 cm  RV S prime:     12.90 cm/s  TAPSE (M-mode): 1.6 cm   LEFT ATRIUM             Index        RIGHT ATRIUM           Index  LA Vol (A2C):   49.9 ml 24.07 ml/m  RA Area:     14.00 cm  LA Vol (A4C):   82.4 ml 39.75 ml/m  RA Volume:   27.80 ml  13.41 ml/m  LA Biplane Vol: 70.9 ml 34.20 ml/m   AORTIC VALVE  AV Area (Vmax):    4.54 cm  AV Area (Vmean):   4.42 cm  AV Area (VTI):     4.75 cm  AV Vmax:           164.00 cm/s  AV Vmean:          111.000 cm/s  AV VTI:            0.347 m  AV Peak Grad:      10.8 mmHg  AV Mean Grad:      6.0 mmHg  LVOT Vmax:         130.00 cm/s  LVOT Vmean:        85.700 cm/s  LVOT VTI:          0.288 m  LVOT/AV VTI ratio: 0.83    AORTA  Ao Root diam: 3.80 cm  Ao Asc diam:  3.40 cm   MITRAL VALVE                 TRICUSPID VALVE  MV Area (PHT): 2.56 cm     TR Peak grad:   5.8 mmHg  MV Decel Time: 296 msec     TR Vmax:        120.00 cm/s  MV E velocity: 77.10 cm/s  MV A velocity: 103.00 cm/s  SHUNTS  MV E/A ratio:  0.75         Systemic VTI:  0.29 m                              Systemic Diam: 2.70 cm   Gloriann Larger MD  Electronically signed by Gloriann Larger MD  Signature Date/Time: 05/24/2023/2:01:46 PM      Impression:  This 78 year old gentleman has a high-grade distal left main bifurcation stenosis resulting in recurrent episodes of angina with rest and light exertion.  I agree that coronary bypass graft surgery is the best treatment to prevent further ischemia and infarction and improve his quality of life.  He has had previous varicose vein injections in both lower legs and therefore we will do vein mapping as well as upper extremity arterial Dopplers to evaluate the left radial artery.  I think his surgery should be done as soon as possible given the degree of stenosis. I discussed the operative procedure with the patient and his wife including alternatives, benefits and risks; including but not limited to bleeding, blood transfusion, infection, stroke, myocardial infarction, graft failure, heart block requiring a permanent pacemaker, organ dysfunction, and death.  Linton Richter understands and agrees to proceed.    Plan:  Coronary bypass graft surgery on Wednesday, 06/08/2023.  I spent 60 minutes performing this consultation and > 50% of this time was spent face to  face counseling and coordinating the care of this patient's severe left main and multivessel coronary disease.   Bartley Lightning, MD [facility] [phone]

## 2023-06-07 ENCOUNTER — Encounter (HOSPITAL_COMMUNITY): Payer: Self-pay

## 2023-06-07 ENCOUNTER — Encounter (HOSPITAL_COMMUNITY)
Admission: RE | Admit: 2023-06-07 | Discharge: 2023-06-07 | Disposition: A | Discharge status: Home / Self Care | Source: Ambulatory Visit | Attending: Surgery | Admitting: Surgery

## 2023-06-07 ENCOUNTER — Other Ambulatory Visit: Payer: Self-pay

## 2023-06-07 ENCOUNTER — Ambulatory Visit (HOSPITAL_COMMUNITY)
Admission: RE | Admit: 2023-06-07 | Discharge: 2023-06-07 | Disposition: A | Discharge status: Home / Self Care | Source: Ambulatory Visit | Attending: Surgery | Admitting: Surgery

## 2023-06-07 ENCOUNTER — Encounter (HOSPITAL_COMMUNITY): Payer: Self-pay | Admitting: Surgery

## 2023-06-07 ENCOUNTER — Ambulatory Visit (HOSPITAL_BASED_OUTPATIENT_CLINIC_OR_DEPARTMENT_OTHER)
Admission: RE | Admit: 2023-06-07 | Discharge: 2023-06-07 | Disposition: A | Discharge status: Home / Self Care | Source: Ambulatory Visit | Attending: Surgery | Admitting: Surgery

## 2023-06-07 VITALS — BP 148/81 | HR 69 | Temp 97.9°F | Resp 17 | Ht 67.5 in | Wt 210.4 lb

## 2023-06-07 DIAGNOSIS — R110 Nausea: Secondary | ICD-10-CM | POA: Diagnosis not present

## 2023-06-07 DIAGNOSIS — Z01818 Encounter for other preprocedural examination: Secondary | ICD-10-CM | POA: Insufficient documentation

## 2023-06-07 DIAGNOSIS — I129 Hypertensive chronic kidney disease with stage 1 through stage 4 chronic kidney disease, or unspecified chronic kidney disease: Secondary | ICD-10-CM | POA: Diagnosis not present

## 2023-06-07 DIAGNOSIS — D472 Monoclonal gammopathy: Secondary | ICD-10-CM | POA: Diagnosis not present

## 2023-06-07 DIAGNOSIS — Z87891 Personal history of nicotine dependence: Secondary | ICD-10-CM | POA: Diagnosis not present

## 2023-06-07 DIAGNOSIS — E785 Hyperlipidemia, unspecified: Secondary | ICD-10-CM | POA: Diagnosis not present

## 2023-06-07 DIAGNOSIS — N179 Acute kidney failure, unspecified: Secondary | ICD-10-CM | POA: Diagnosis not present

## 2023-06-07 DIAGNOSIS — I2510 Atherosclerotic heart disease of native coronary artery without angina pectoris: Secondary | ICD-10-CM | POA: Diagnosis not present

## 2023-06-07 DIAGNOSIS — I25119 Atherosclerotic heart disease of native coronary artery with unspecified angina pectoris: Secondary | ICD-10-CM | POA: Diagnosis not present

## 2023-06-07 DIAGNOSIS — K5900 Constipation, unspecified: Secondary | ICD-10-CM | POA: Diagnosis not present

## 2023-06-07 DIAGNOSIS — I493 Ventricular premature depolarization: Secondary | ICD-10-CM | POA: Diagnosis not present

## 2023-06-07 DIAGNOSIS — Z79899 Other long term (current) drug therapy: Secondary | ICD-10-CM | POA: Diagnosis not present

## 2023-06-07 DIAGNOSIS — Z955 Presence of coronary angioplasty implant and graft: Secondary | ICD-10-CM | POA: Diagnosis not present

## 2023-06-07 DIAGNOSIS — I471 Supraventricular tachycardia, unspecified: Secondary | ICD-10-CM | POA: Diagnosis not present

## 2023-06-07 DIAGNOSIS — R7303 Prediabetes: Secondary | ICD-10-CM | POA: Diagnosis not present

## 2023-06-07 DIAGNOSIS — I252 Old myocardial infarction: Secondary | ICD-10-CM | POA: Diagnosis not present

## 2023-06-07 DIAGNOSIS — D696 Thrombocytopenia, unspecified: Secondary | ICD-10-CM | POA: Diagnosis not present

## 2023-06-07 DIAGNOSIS — K219 Gastro-esophageal reflux disease without esophagitis: Secondary | ICD-10-CM | POA: Diagnosis not present

## 2023-06-07 DIAGNOSIS — L299 Pruritus, unspecified: Secondary | ICD-10-CM | POA: Diagnosis not present

## 2023-06-07 DIAGNOSIS — Z85828 Personal history of other malignant neoplasm of skin: Secondary | ICD-10-CM | POA: Diagnosis not present

## 2023-06-07 DIAGNOSIS — G4733 Obstructive sleep apnea (adult) (pediatric): Secondary | ICD-10-CM | POA: Diagnosis not present

## 2023-06-07 DIAGNOSIS — N182 Chronic kidney disease, stage 2 (mild): Secondary | ICD-10-CM | POA: Diagnosis not present

## 2023-06-07 DIAGNOSIS — D62 Acute posthemorrhagic anemia: Secondary | ICD-10-CM | POA: Diagnosis not present

## 2023-06-07 HISTORY — DX: Basal cell carcinoma of skin, unspecified: C44.91

## 2023-06-07 HISTORY — DX: Angina pectoris, unspecified: I20.9

## 2023-06-07 LAB — HEMOGLOBIN A1C
Hgb A1c MFr Bld: 6 % — ABNORMAL HIGH (ref 4.8–5.6)
Mean Plasma Glucose: 125.5 mg/dL

## 2023-06-07 LAB — URINALYSIS, ROUTINE W REFLEX MICROSCOPIC
Bilirubin Urine: NEGATIVE
Glucose, UA: NEGATIVE mg/dL
Hgb urine dipstick: NEGATIVE
Ketones, ur: NEGATIVE mg/dL
Leukocytes,Ua: NEGATIVE
Nitrite: NEGATIVE
Protein, ur: NEGATIVE mg/dL
Specific Gravity, Urine: 1.016 (ref 1.005–1.030)
pH: 5 (ref 5.0–8.0)

## 2023-06-07 LAB — CBC
HCT: 42 % (ref 39.0–52.0)
Hemoglobin: 13.9 g/dL (ref 13.0–17.0)
MCH: 31.9 pg (ref 26.0–34.0)
MCHC: 33.1 g/dL (ref 30.0–36.0)
MCV: 96.3 fL (ref 80.0–100.0)
Platelets: 178 10*3/uL (ref 150–400)
RBC: 4.36 MIL/uL (ref 4.22–5.81)
RDW: 13.7 % (ref 11.5–15.5)
WBC: 6.9 10*3/uL (ref 4.0–10.5)
nRBC: 0 % (ref 0.0–0.2)

## 2023-06-07 LAB — COMPREHENSIVE METABOLIC PANEL WITH GFR
ALT: 30 U/L (ref 0–44)
AST: 26 U/L (ref 15–41)
Albumin: 3.5 g/dL (ref 3.5–5.0)
Alkaline Phosphatase: 52 U/L (ref 38–126)
Anion gap: 9 (ref 5–15)
BUN: 23 mg/dL (ref 8–23)
CO2: 25 mmol/L (ref 22–32)
Calcium: 9.3 mg/dL (ref 8.9–10.3)
Chloride: 103 mmol/L (ref 98–111)
Creatinine, Ser: 1.02 mg/dL (ref 0.61–1.24)
GFR, Estimated: >60 mL/min (ref >60)
Glucose, Bld: 146 mg/dL — ABNORMAL HIGH (ref 70–99)
Potassium: 4 mmol/L (ref 3.5–5.1)
Sodium: 137 mmol/L (ref 135–145)
Total Bilirubin: 0.8 mg/dL (ref 0.0–1.2)
Total Protein: 6.5 g/dL (ref 6.5–8.1)

## 2023-06-07 LAB — APTT: aPTT: 28 s (ref 24–36)

## 2023-06-07 LAB — PROTIME-INR
INR: 1 (ref 0.8–1.2)
Prothrombin Time: 13.5 s (ref 11.4–15.2)

## 2023-06-07 LAB — TYPE AND SCREEN
ABO/RH(D): A POS
Antibody Screen: NEGATIVE

## 2023-06-07 LAB — VAS US DOPPLER PRE CABG
Left ABI: 1.2
Right ABI: 1.1

## 2023-06-07 LAB — SURGICAL PCR SCREEN
MRSA, PCR: NEGATIVE
Staphylococcus aureus: NEGATIVE

## 2023-06-07 MED ORDER — VANCOMYCIN HCL 1.5 G IV SOLR
1500.0000 mg | INTRAVENOUS | Status: AC
  Administered 2023-06-08: 1500 mg via INTRAVENOUS
  Filled 2023-06-07: qty 30

## 2023-06-07 MED ORDER — TRANEXAMIC ACID (OHS) BOLUS VIA INFUSION
15.0000 mg/kg | INTRAVENOUS | Status: AC
  Administered 2023-06-08: 1431 mg via INTRAVENOUS
  Filled 2023-06-07: qty 1431

## 2023-06-07 MED ORDER — TRANEXAMIC ACID (OHS) PUMP PRIME SOLUTION
2.0000 mg/kg | INTRAVENOUS | Status: DC
  Filled 2023-06-07: qty 1.91

## 2023-06-07 MED ORDER — MILRINONE LACTATE IN DEXTROSE 20-5 MG/100ML-% IV SOLN
0.3000 ug/kg/min | INTRAVENOUS | Status: DC
  Filled 2023-06-07: qty 100

## 2023-06-07 MED ORDER — NITROGLYCERIN IN D5W 200-5 MCG/ML-% IV SOLN
2.0000 ug/min | INTRAVENOUS | Status: DC
  Filled 2023-06-07: qty 250

## 2023-06-07 MED ORDER — POTASSIUM CHLORIDE 2 MEQ/ML IV SOLN
80.0000 meq | INTRAVENOUS | Status: DC
  Filled 2023-06-07: qty 40

## 2023-06-07 MED ORDER — PLASMA-LYTE A IV SOLN
INTRAVENOUS | Status: DC
  Filled 2023-06-07: qty 2.5

## 2023-06-07 MED ORDER — MAGNESIUM SULFATE 50 % IJ SOLN
40.0000 meq | INTRAMUSCULAR | Status: DC
  Filled 2023-06-07: qty 9.85

## 2023-06-07 MED ORDER — EPINEPHRINE HCL 5 MG/250ML IV SOLN IN NS
0.0000 ug/min | INTRAVENOUS | Status: DC
  Filled 2023-06-07: qty 250

## 2023-06-07 MED ORDER — NOREPINEPHRINE 4 MG/250ML-% IV SOLN
0.0000 ug/min | INTRAVENOUS | Status: DC
  Filled 2023-06-07: qty 250

## 2023-06-07 MED ORDER — CEFAZOLIN SODIUM-DEXTROSE 2-4 GM/100ML-% IV SOLN
2.0000 g | INTRAVENOUS | Status: AC
  Administered 2023-06-08: 2 g via INTRAVENOUS
  Filled 2023-06-07: qty 100

## 2023-06-07 MED ORDER — HEPARIN 30,000 UNITS/1000 ML (OHS) CELLSAVER SOLUTION
Status: DC
  Filled 2023-06-07: qty 1000

## 2023-06-07 MED ORDER — PHENYLEPHRINE HCL-NACL 20-0.9 MG/250ML-% IV SOLN
30.0000 ug/min | INTRAVENOUS | Status: AC
  Administered 2023-06-08: 40 ug/min via INTRAVENOUS
  Filled 2023-06-07: qty 250

## 2023-06-07 MED ORDER — DEXMEDETOMIDINE HCL IN NACL 400 MCG/100ML IV SOLN
0.1000 ug/kg/h | INTRAVENOUS | Status: AC
  Administered 2023-06-08: .4 ug/kg/h via INTRAVENOUS
  Filled 2023-06-07: qty 100

## 2023-06-07 MED ORDER — TRANEXAMIC ACID 1000 MG/10ML IV SOLN
1.5000 mg/kg/h | INTRAVENOUS | Status: AC
  Administered 2023-06-08: 1.5 mg/kg/h via INTRAVENOUS
  Filled 2023-06-07: qty 25

## 2023-06-07 MED ORDER — INSULIN REGULAR(HUMAN) IN NACL 100-0.9 UT/100ML-% IV SOLN
INTRAVENOUS | Status: AC
  Administered 2023-06-08: 2.4 [IU]/h via INTRAVENOUS
  Filled 2023-06-07: qty 100

## 2023-06-07 NOTE — H&P (Signed)
 [address]       Tim Walters [id number]             [phone]      Cardiothoracic Surgery Admission History and Physical   PCP is Tim Walters, Champ Coma, NP Referring Provider is [place], Peter M, MD Primary Cardiologist: Tim Backbone, MD       Chief Complaint  Patient presents with   Coronary Artery Disease      Cardiac Cath  and ECHO 05/24/23      HPI:   The patient is a 78 year old gentleman with a history of hypertension, hyperlipidemia, stage II chronic kidney disease, prediabetes, OSA, and coronary artery disease status post RCA stenting in the past who was referred for consideration of CABG.  He was followed by Tim Walters for a number years and is now followed by Dr. Lorie Walters.  He reports having a 31-month history of intermittent exertional chest tightness that has progressed to the point where he is having episodes at rest that respond to nitroglycerin .  He has also had decreased stamina and exertional shortness of breath.  Cardiac catheterization on 05/24/2023 showed a 90% distal left main stenosis with haziness to the bifurcation.  There was 70% proximal to mid LAD stenosis.  The first diagonal branch had about 50% stenosis.  There is a large left circumflex without disease.  The RCA was diffusely diseased throughout its proximal and midportion to 70%.  Left ventricular ejection fraction was normal with normal LVEDP.  2D echocardiogram on 05/24/2023 showed a hyperdynamic left ventricle with ejection fraction greater than 75%.  There is grade 1 diastolic dysfunction.  There is no significant valvular abnormality.   He is retired and here today with his wife.  He has tried to remain active but has been very limited by substernal chest discomfort.  He reports having vein injections in both lower legs for varicosities in the past.     Past Medical History:  Diagnosis Date   Arthritis      lt ankle   Breast lump      Per records from [facility]    Colon  polyp     Coronary artery disease      a. BMS to RCA 2003 with residual LAD/diag disease treated medically, normal EF.   COVID     Depression     Diverticulosis      Per records from [facility]    GERD (gastroesophageal reflux disease)     Hyperkalemia      a. K of 5.2 in 2017.   Hyperlipidemia     Hypertension     Idiopathic peripheral neuropathy      Per records from [facility]    Impaired fasting glucose      Per records from [facility]    Kidney stones     Monoclonal gammopathy of undetermined significance      Per records from [facility]    Myocardial infarction [facility]) 11/15/2001    Tim Walters [facility])   OSA (obstructive sleep apnea)      Per [facility] New Patient Packet   Peripheral neuropathy      Left Foot, Per [facility] New Patient Packet   Pre-diabetes     Sleep apnea      had test several yr ago-said he did not need a cpap-still snores   Torn rotator cuff      Per [facility] New Patient Packet   Venous  insufficiency of left leg      Per [facility] New Patient Packet   Wears glasses                 Past Surgical History:  Procedure Laterality Date   ANGIOPLASTY   2003    Per records from [facility]    BASAL CELL CARCINOMA EXCISION        left tricep   BASAL CELL CARCINOMA EXCISION   11/10/2022    right shoulder   CARDIAC CATHETERIZATION   [address]    stent rca   CARDIOVASCULAR STRESS TEST   02/16/2016    Per records from [place] [facility] [facility]    COLONOSCOPY   06/17/2003    Per records from [facility], TimGanem to be repeated 2015   coronary artery stent   11/15/2001   CORONARY STENT PLACEMENT   11/15/2001    Per records from [place] [facility]    CYST REMOVAL TRUNK Left 05/08/2013    Procedure: CYST REMOVAL BACK;  Surgeon: Levert Ready, MD;  Location: [facility];  Service: General;  Laterality: Left;   INGUINAL HERNIA REPAIR Left 03/24/2015    Procedure: OPEN REPAIR LEFT INGUINAL HERNIA ;  Surgeon: Boyce Byes,  MD;  Location: [facility];  Service: General;  Laterality: Left;   INSERTION OF MESH Left 03/24/2015    Procedure: INSERTION OF MESH;  Surgeon: Boyce Byes, MD;  Location: [facility];  Service: General;  Laterality: Left;   LEFT HEART CATH AND CORONARY ANGIOGRAPHY N/A 05/24/2023    Procedure: LEFT HEART CATH AND CORONARY ANGIOGRAPHY;  Surgeon: [place], Peter M, MD;  Location: [facility] INVASIVE CV LAB;  Service: Cardiovascular;  Laterality: N/A;   SHOULDER ARTHROSCOPY W/ ROTATOR CUFF REPAIR   2011    right   SPINE SURGERY   02/12/1993    L2, L3 fragmented disc   VASECTOMY   1987   VEIN REPAIR   09/2018    Vein injections               Family History  Problem Relation Age of Onset   Macular degeneration Mother     COPD Mother     Non-Hodgkin's lymphoma Mother     Lung cancer Mother          Per [facility] New Patient Packet    Hypertension Father     Suicidality Father 78        Per [facility] New Patient Packet    Depression Father     Angina Father          Per records from [facility]    Diverticulosis Father          Per records from [facility]    Heart disease Father     High Cholesterol Sister     Macular degeneration Maternal Grandmother     Schizophrenia Son     Bipolar disorder Son     Autism Son     Post-traumatic stress disorder Son     Diabetes type II Daughter     Breast cancer Neg Hx     Colon cancer Neg Hx     Esophageal cancer Neg Hx     Stomach cancer Neg Hx     Liver disease Neg Hx     Pancreatic cancer Neg Hx            Social History Social History  Social History  Tobacco Use   Smoking status: Former      Current packs/day: 0.00      Types: Cigarettes      Start date: 02/15/1962      Quit date: 02/15/1974      Years since quitting: 49.3   Smokeless tobacco: Never  Vaping Use   Vaping status: Never Used  Substance Use Topics   Alcohol use: Yes      Alcohol/week: 14.0 standard drinks of alcohol      Types: 14  Standard drinks or equivalent per week      Comment: 14 drinks weekly in the evening    Drug use: No              Current Outpatient Medications  Medication Sig Dispense Refill   aspirin  81 MG tablet Take 1 tablet (81 mg total) by mouth daily.       atorvastatin  (LIPITOR) 80 MG tablet TAKE 1 TABLET BY MOUTH ONCE  DAILY 90 tablet 3   ezetimibe  (ZETIA ) 10 MG tablet TAKE 1 TABLET BY MOUTH DAILY 90 tablet 3   hydrochlorothiazide  (MICROZIDE ) 12.5 MG capsule TAKE 1 CAPSULE BY MOUTH DAILY 90 capsule 3   isosorbide  mononitrate (IMDUR ) 30 MG 24 hr tablet Take 1 tablet (30 mg total) by mouth at bedtime. 90 tablet 3   metoprolol  tartrate (LOPRESSOR ) 25 MG tablet TAKE 1 TABLET BY MOUTH TWICE  DAILY 180 tablet 3   Multiple Vitamin (MULTIVITAMIN WITH MINERALS) TABS tablet Take 1 tablet by mouth daily.       nitroGLYCERIN  (NITROSTAT ) 0.4 MG SL tablet Place 1 tablet (0.4 mg total) under the tongue every 5 (five) minutes as needed for chest pain. 25 tablet 6   omeprazole (PRILOSEC) 20 MG capsule Take 20 mg by mouth daily.       ramipril  (ALTACE ) 10 MG capsule Take 2 capsules (20 mg total) by mouth daily. 180 capsule 3   sertraline  (ZOLOFT ) 50 MG tablet TAKE 1 TABLET BY MOUTH DAILY 90 tablet 3      No current facility-administered medications for this visit.             Facility-Administered Medications Ordered in Other Visits  Medication Dose Route Frequency Provider Last Rate Last Admin   regadenoson  (LEXISCAN ) injection SOLN 0.4 mg  0.4 mg Intravenous Once Dick, Ernest H Jr., NP            Allergies  No Known Allergies     Review of Systems  Constitutional:  Positive for activity change and fatigue.  HENT: Negative.    Eyes: Negative.   Respiratory:  Positive for shortness of breath.   Cardiovascular:  Positive for chest pain and leg swelling.  Gastrointestinal: Negative.   Endocrine: Negative.   Genitourinary: Negative.   Musculoskeletal: Negative.   Allergic/Immunologic: Negative.    Neurological:  Negative for dizziness and syncope.  Hematological: Negative.   Psychiatric/Behavioral: Negative.        BP (!) 165/85   Pulse 100   Resp 20   Ht 5\' 7"  (1.702 Walters)   Wt 212 lb (96.2 kg)   SpO2 95% Comment: RA  BMI 33.20 kg/Walters  Physical Exam Constitutional:      Appearance: He is obese.  HENT:     Head: Normocephalic and atraumatic.  Eyes:     Extraocular Movements: Extraocular movements intact.     Conjunctiva/sclera: Conjunctivae normal.     Pupils: Pupils are equal, round, and reactive to light.  Neck:  Vascular: No carotid bruit.  Cardiovascular:     Rate and Rhythm: Normal rate and regular rhythm.     Pulses: Normal pulses.     Heart sounds: Normal heart sounds. No murmur heard. Pulmonary:     Effort: Pulmonary effort is normal.     Breath sounds: Normal breath sounds.  Musculoskeletal:        General: No swelling.  Skin:    General: Skin is warm and dry.  Neurological:     General: No focal deficit present.     Mental Status: He is alert and oriented to person, place, and time.  Psychiatric:        Mood and Affect: Mood normal.        Behavior: Behavior normal.      Diagnostic Tests:   Physicians   Panel Physicians Referring Physician Case Authorizing Physician  [place], Peter M, MD (Primary)        Procedures   LEFT HEART CATH AND CORONARY ANGIOGRAPHY    Conclusion       Prox LAD to Mid LAD lesion is 70% stenosed.   Mid LM to Dist LM lesion is 90% stenosed.   1st Diag lesion is 50% stenosed.   Prox RCA to Mid RCA lesion is 70% stenosed.   The left ventricular systolic function is normal.   LV end diastolic pressure is normal.   The left ventricular ejection fraction is 55-65% by visual estimate.   Left main and severe 2 vessel CAD Normal LV function Normal LVEDP     Plan: will refer to CT surgery for CABG   Indications   Coronary artery disease of native artery of native heart with stable angina pectoris (HCC) [Z61.096  (ICD-10-CM)]    Procedural Details   Technical Details Indication: 78 yo WM with remote stenting of the RCA in 2003 for STEMI. Presents with progressive exertional angina despite good medical therapy  Procedural Details: The right wrist was prepped, draped, and anesthetized with 1% lidocaine . Ultrasound was used to guide access. Image was obtained and stored in the record.  Using the modified Seldinger technique, a 6 French slender sheath was introduced into the right radial artery. 3 mg of verapamil  was administered through the sheath, weight-based unfractionated heparin  was administered intravenously. Due to tortuosity of the innominate a Destination sheath was used in order to image the RCA.  Standard Judkins catheters were used for selective coronary angiography and left ventriculography. A LA 0.75 was used to image the RCA. Catheter exchanges were performed over an exchange length guidewire. There were no immediate procedural complications. A TR band was used for radial hemostasis at the completion of the procedure.  The patient was transferred to the post catheterization recovery area for further monitoring.  Contrast: 70 cc Estimated blood loss <50 mL.   During this procedure medications were administered to achieve and maintain moderate conscious sedation while the patient's heart rate, blood pressure, and oxygen saturation were continuously monitored and I was present face-to-face 100% of this time. Algie Antis RN and Tommi Fraise Cardiovascular Specialist  [facility] Cardiovascular Specialist are independent, trained observers who assisted in the monitoring of the patient's level of consciousness.    Medications (Filter: Administrations occurring from 0901 to 1012 on 05/24/23) Heparin  (Porcine) in NaCl 1000-0.9 UT/500ML-% SOLN (mL)  Total volume: 1,000 mL Date/Time Rate/Dose/Volume Action    05/24/23 0910 1,000 mL Given    midazolam  (VERSED ) injection (mg)  Total dose: 1  mg Date/Time Rate/Dose/Volume Action  05/24/23 0921 1 mg Given    fentaNYL  (SUBLIMAZE ) injection (mcg)  Total dose: 25 mcg Date/Time Rate/Dose/Volume Action    05/24/23 0921 25 mcg Given    lidocaine  (PF) (XYLOCAINE ) 1 % injection (mL)  Total volume: 2 mL Date/Time Rate/Dose/Volume Action    05/24/23 0922 2 mL Given    Radial Cocktail/Verapamil  only (mL)  Total volume: 16 mL Date/Time Rate/Dose/Volume Action    05/24/23 0922 8 mL Given    0949 8 mL Given    heparin  sodium (porcine) injection (Units)  Total dose: 5,000 Units Date/Time Rate/Dose/Volume Action    05/24/23 0925 5,000 Units Given    iohexol  (OMNIPAQUE ) 350 MG/ML injection (mL)  Total volume: 70 mL Date/Time Rate/Dose/Volume Action    05/24/23 1005 70 mL Given      Sedation Time   Sedation Time Physician-1: 41 minutes 33 seconds Contrast        Administrations occurring from 0901 to 1012 on 05/24/23:  Medication Name Total Dose  iohexol  (OMNIPAQUE ) 350 MG/ML injection 70 mL    Radiation/Fluoro   Fluoro time: 16.7 (min) DAP: 29301 (mGycm2) Cumulative Air Kerma: 432 (mGy) Complications   Complications documented before study signed (05/24/2023 10:13 AM)    No complications were associated with this study.  Documented by Apolinar Klippel - 05/24/2023 10:06 AM      Coronary Findings   Diagnostic Dominance: Right Left Main  Mid LM to Dist LM lesion is 90% stenosed. The lesion is calcified.    Left Anterior Descending  Prox LAD to Mid LAD lesion is 70% stenosed.    First Diagonal Branch  1st Diag lesion is 50% stenosed.    Left Circumflex    Second Obtuse Marginal Branch  Vessel is large in size.    Right Coronary Artery  Prox RCA to Mid RCA lesion is 70% stenosed. The lesion was previously treated .    Intervention    No interventions have been documented.    Left Heart   Left Ventricle The left ventricular size is normal. The left ventricular systolic function is normal. LV end diastolic  pressure is normal. The left ventricular ejection fraction is 55-65% by visual estimate. No regional wall motion abnormalities.    Coronary Diagrams   Diagnostic Dominance: Right  Intervention    Implants    No implant documentation for this case.    Syngo Images    Show images for CARDIAC CATHETERIZATION Images on Long Term Storage    Show images for Ranvir, Renovato to Procedure Log   Procedure Log    Hemo Data   Flowsheet Row Most Recent Value  AO Systolic Pressure 79 mmHg  AO Diastolic Pressure 78 mmHg  AO Mean 78 mmHg  LV Systolic Pressure 106 mmHg  LV Diastolic Pressure 7 mmHg  LV EDP 15 mmHg  Arterial Occlusion Pressure Extended Systolic Pressure 102 mmHg  Arterial Occlusion Pressure Extended Diastolic Pressure 54 mmHg  Arterial Occlusion Pressure Extended Mean Pressure 71 mmHg  Left Ventricular Apex Extended Systolic Pressure 110 mmHg  LVp Diastolic Pressure 31 mmHg  Left Ventricular Apex Extended EDP Pressure 20 mmHg    ECHOCARDIOGRAM REPORT       Patient Name:   Tim Walters Date of Exam: 05/24/2023  Medical Rec #:  [id number]          Height:       67.0 in  Accession #:    [id number]         Weight:  212.0 lb  Date of Birth:  1945-06-18         BSA:          2.073 Walters  Patient Age:    17 years           BP:           119/65 mmHg  Patient Gender: Walters                  HR:           64 bpm.  Exam Location:  Inpatient   Procedure: 2D Echo, Cardiac Doppler, Color Doppler and Intracardiac             Opacification Agent (Both Spectral and Color Flow Doppler were             utilized during procedure).   Indications:    CAD    History:        Patient has prior history of Echocardiogram examinations,  most                 recent 07/28/2022. Risk Factors:Dyslipidemia, Hypertension  and                 Sleep Apnea.    Sonographer:    Meagan Baucom RDCS, FE, PE  Referring Phys: [id number] PETER Walters [place]   IMPRESSIONS     1. Left ventricular  ejection fraction, by estimation, is >75%. The left  ventricle has hyperdynamic function. The left ventricle has no regional  wall motion abnormalities. Left ventricular diastolic parameters are  consistent with Grade I diastolic  dysfunction (impaired relaxation).   2. Right ventricular systolic function is normal. The right ventricular  size is normal.   3. Left atrial size was mildly dilated.   4. The mitral valve is normal in structure. No evidence of mitral valve  regurgitation. No evidence of mitral stenosis.   5. The aortic valve is tricuspid. There is mild thickening of the aortic  valve. Aortic valve regurgitation is not visualized. Aortic valve  sclerosis is present, with no evidence of aortic valve stenosis.   6. The inferior vena cava is normal in size with greater than 50%  respiratory variability, suggesting right atrial pressure of 3 mmHg.   Comparison(s): No significant change from prior study. Prior images  reviewed side by side.   FINDINGS   Left Ventricle: Left ventricular ejection fraction, by estimation, is  >75%. The left ventricle has hyperdynamic function. The left ventricle has  no regional wall motion abnormalities. Strain was performed and the global  longitudinal strain is  indeterminate. The left ventricular internal cavity size was normal in  size. There is no left ventricular hypertrophy. Left ventricular diastolic  parameters are consistent with Grade I diastolic dysfunction (impaired  relaxation).   Right Ventricle: The right ventricular size is normal. No increase in  right ventricular wall thickness. Right ventricular systolic function is  normal.   Left Atrium: Left atrial size was mildly dilated.   Right Atrium: Right atrial size was normal in size.   Pericardium: There is no evidence of pericardial effusion.   Mitral Valve: The mitral valve is normal in structure. No evidence of  mitral valve regurgitation. No evidence of mitral valve  stenosis.   Tricuspid Valve: The tricuspid valve is normal in structure. Tricuspid  valve regurgitation is not demonstrated. No evidence of tricuspid  stenosis.   Aortic Valve: The aortic valve is tricuspid. There is mild thickening of  the aortic valve. There is mild aortic valve annular calcification. Aortic  valve regurgitation is not visualized. Aortic valve sclerosis is present,  with no evidence of aortic valve   stenosis. Aortic valve mean gradient measures 6.0 mmHg. Aortic valve peak  gradient measures 10.8 mmHg. Aortic valve area, by VTI measures 4.75 cm.   Pulmonic Valve: The pulmonic valve was not well visualized. Pulmonic valve  regurgitation is not visualized. No evidence of pulmonic stenosis.   Aorta: The aortic root and ascending aorta are structurally normal, with  no evidence of dilitation.   Venous: The inferior vena cava is normal in size with greater than 50%  respiratory variability, suggesting right atrial pressure of 3 mmHg.   IAS/Shunts: The atrial septum is grossly normal.   Additional Comments: 3D was performed not requiring image post processing  on an independent workstation and was indeterminate.     LEFT VENTRICLE  PLAX 2D  LVIDd:         4.20 cm   Diastology  LVIDs:         2.30 cm   LV e' medial:    5.91 cm/s  LV PW:         1.10 cm   LV E/e' medial:  13.0  LV IVS:        0.90 cm   LV e' lateral:   11.80 cm/s  LVOT diam:     2.70 cm   LV E/e' lateral: 6.5  LV SV:         165  LV SV Index:   80  LVOT Area:     5.73 cm     RIGHT VENTRICLE  RV Basal diam:  3.70 cm  RV Mid diam:    3.10 cm  RV S prime:     12.90 cm/s  TAPSE (Walters-mode): 1.6 cm   LEFT ATRIUM             Index        RIGHT ATRIUM           Index  LA Vol (A2C):   49.9 ml 24.07 ml/Walters  RA Area:     14.00 cm  LA Vol (A4C):   82.4 ml 39.75 ml/Walters  RA Volume:   27.80 ml  13.41 ml/Walters  LA Biplane Vol: 70.9 ml 34.20 ml/Walters   AORTIC VALVE  AV Area (Vmax):    4.54 cm  AV Area (Vmean):    4.42 cm  AV Area (VTI):     4.75 cm  AV Vmax:           164.00 cm/s  AV Vmean:          111.000 cm/s  AV VTI:            0.347 Walters  AV Peak Grad:      10.8 mmHg  AV Mean Grad:      6.0 mmHg  LVOT Vmax:         130.00 cm/s  LVOT Vmean:        85.700 cm/s  LVOT VTI:          0.288 Walters  LVOT/AV VTI ratio: 0.83    AORTA  Ao Root diam: 3.80 cm  Ao Asc diam:  3.40 cm   MITRAL VALVE                TRICUSPID VALVE  MV Area (PHT): 2.56 cm     TR Peak grad:   5.8 mmHg  MV Decel Time: 296 msec     TR Vmax:        120.00 cm/s  MV E velocity: 77.10 cm/s  MV A velocity: 103.00 cm/s  SHUNTS  MV E/A ratio:  0.75         Systemic VTI:  0.29 Walters                              Systemic Diam: 2.70 cm   Gloriann Larger MD  Electronically signed by Gloriann Larger MD  Signature Date/Time: 05/24/2023/2:01:46 PM       Impression:   This 78 year old gentleman has a high-grade distal left main bifurcation stenosis resulting in recurrent episodes of angina with rest and light exertion.  I agree that coronary bypass graft surgery is the best treatment to prevent further ischemia and infarction and improve his quality of life. Vein mapping shows the saphenous vein to be present in the right leg but not in the left. His left hand is not radial artery dependent so I think we can use the radial if necessary.  I think his surgery should be done as soon as possible given the degree of stenosis. I discussed the operative procedure with the patient and his wife including alternatives, benefits and risks; including but not limited to bleeding, blood transfusion, infection, stroke, myocardial infarction, graft failure, heart block requiring a permanent pacemaker, organ dysfunction, and death.  Tim Walters understands and agrees to proceed.     Plan:   Coronary bypass graft surgery with possible left radial artery graft depending on how his saphenous vein is.       Bartley Lightning, MD [facility] [phone]

## 2023-06-07 NOTE — Pre-Procedure Instructions (Signed)
 Surgical Instructions   Your procedure is scheduled on Wednesday, April 23rd. Report to [facility] Main Entrance "A" at 05:30 A.M., then check in with the Admitting office. Any questions or running late day of surgery: call [phone]  Questions prior to your surgery date: call [phone], Monday-Friday, 8am-4pm. If you experience any cold or flu symptoms such as cough, fever, chills, shortness of breath, etc. between now and your scheduled surgery, please notify us  at the above number.     Remember:  Do not eat or drink after midnight the night before your surgery     Take these medicines the morning of surgery with A SIP OF WATER  atorvastatin  (LIPITOR)  ezetimibe  (ZETIA )  metoprolol  tartrate (LOPRESSOR )  omeprazole (PRILOSEC)  sertraline  (ZOLOFT )   May take these medicines IF NEEDED: nitroGLYCERIN  (NITROSTAT )- If you have to take this medication prior to surgery, please call [phone] and report this to a nurse    One week prior to surgery, STOP taking any Aleve, Naproxen, Ibuprofen, Motrin, Advil, Goody's, BC's, all herbal medications, fish oil, and non-prescription vitamins.                     Do NOT Smoke (Tobacco/Vaping) for 24 hours prior to your procedure.  If you use a CPAP at night, you may bring your mask/headgear for your overnight stay.   You will be asked to remove any contacts, glasses, piercing's, hearing aid's, dentures/partials prior to surgery. Please bring cases for these items if needed.    Patients discharged the day of surgery will not be allowed to drive home, and someone needs to stay with them for 24 hours.  SURGICAL WAITING ROOM VISITATION Patients may have no more than 2 support people in the waiting area - these visitors may rotate.   Pre-op nurse will coordinate an appropriate time for 1 ADULT support person, who may not rotate, to accompany patient in pre-op.  Children under the age of 34 must have an adult with them who is not the  patient and must remain in the main waiting area with an adult.  If the patient needs to stay at the hospital during part of their recovery, the visitor guidelines for inpatient rooms apply.  Please refer to the [facility] website for the visitor guidelines for any additional information.   If you received a COVID test during your pre-op visit  it is requested that you wear a mask when out in public, stay away from anyone that may not be feeling well and notify your surgeon if you develop symptoms. If you have been in contact with anyone that has tested positive in the last 10 days please notify you surgeon.      Pre-operative CHG Bathing Instructions   You can play a key role in reducing the risk of infection after surgery. Your skin needs to be as free of germs as possible. You can reduce the number of germs on your skin by washing with CHG (chlorhexidine  gluconate) soap before surgery. CHG is an antiseptic soap that kills germs and continues to kill germs even after washing.   DO NOT use if you have an allergy to chlorhexidine /CHG or antibacterial soaps. If your skin becomes reddened or irritated, stop using the CHG and notify one of our RNs at [phone].              TAKE A SHOWER THE NIGHT BEFORE SURGERY AND THE DAY OF SURGERY    Please keep in mind  the following:  DO NOT shave, including legs and underarms, 48 hours prior to surgery.   You may shave your face before/day of surgery.  Place clean sheets on your bed the night before surgery Use a clean washcloth (not used since being washed) for each shower. DO NOT sleep with pet's night before surgery.  CHG Shower Instructions:  Wash your face and private area with normal soap. If you choose to wash your hair, wash first with your normal shampoo.  After you use shampoo/soap, rinse your hair and body thoroughly to remove shampoo/soap residue.  Turn the water OFF and apply half the bottle of CHG soap to a CLEAN washcloth.  Apply  CHG soap ONLY FROM YOUR NECK DOWN TO YOUR TOES (washing for 3-5 minutes)  DO NOT use CHG soap on face, private areas, open wounds, or sores.  Pay special attention to the area where your surgery is being performed.  If you are having back surgery, having someone wash your back for you may be helpful. Wait 2 minutes after CHG soap is applied, then you may rinse off the CHG soap.  Pat dry with a clean towel  Put on clean pajamas    Additional instructions for the day of surgery: DO NOT APPLY any lotions, deodorants, cologne, or perfumes.   Do not wear jewelry or makeup Do not wear nail polish, gel polish, artificial nails, or any other type of covering on natural nails (fingers and toes) Do not bring valuables to the hospital. [facility] is not responsible for valuables/personal belongings. Put on clean/comfortable clothes.  Please brush your teeth.  Ask your nurse before applying any prescription medications to the skin.

## 2023-06-07 NOTE — Anesthesia Preprocedure Evaluation (Signed)
 Anesthesia Evaluation  Patient identified by MRN, date of birth, ID band Patient awake    Reviewed: Allergy & Precautions, NPO status , Patient's Chart, lab work & pertinent test results  Airway Mallampati: III  TM Distance: >3 FB Neck ROM: Full    Dental  (+) Dental Advisory Given, Teeth Intact   Pulmonary former smoker   Pulmonary exam normal breath sounds clear to auscultation       Cardiovascular hypertension, Pt. on medications + angina  + CAD, + Past MI and + Cardiac Stents  Normal cardiovascular exam Rhythm:Regular Rate:Normal  Echo 05/2023  1. Left ventricular ejection fraction, by estimation, is >75%. The left ventricle has hyperdynamic function. The left ventricle has no regional wall motion abnormalities. Left ventricular diastolic parameters are consistent with Grade I diastolic dysfunction (impaired relaxation).   2. Right ventricular systolic function is normal. The right ventricular size is normal.   3. Left atrial size was mildly dilated.   4. The mitral valve is normal in structure. No evidence of mitral valve regurgitation. No evidence of mitral stenosis.   5. The aortic valve is tricuspid. There is mild thickening of the aortic valve. Aortic valve regurgitation is not visualized. Aortic valve sclerosis is present, with no evidence of aortic valve stenosis.   6. The inferior vena cava is normal in size with greater than 50% respiratory variability, suggesting right atrial pressure of 3 mmHg.   Comparison(s): No significant change from prior study. Prior images reviewed side by side.      Neuro/Psych negative neurological ROS  negative psych ROS   GI/Hepatic Neg liver ROS,GERD  ,,  Endo/Other  negative endocrine ROS    Renal/GU Renal disease     Musculoskeletal  (+) Arthritis ,    Abdominal  (+) + obese  Peds  Hematology negative hematology ROS (+)   Anesthesia Other Findings    Reproductive/Obstetrics negative OB ROS                             Anesthesia Physical Anesthesia Plan  ASA: 4  Anesthesia Plan: General   Post-op Pain Management:    Induction: Intravenous  PONV Risk Score and Plan: 3 and Treatment may vary due to age or medical condition  Airway Management Planned: Oral ETT  Additional Equipment: Arterial line, CVP, PA Cath, TEE and Ultrasound Guidance Line Placement  Intra-op Plan: Utilization Of Total Body Hypothermia per surgeon request  Post-operative Plan: Post-operative intubation/ventilation  Informed Consent: I have reviewed the patients History and Physical, chart, labs and discussed the procedure including the risks, benefits and alternatives for the proposed anesthesia with the patient or authorized representative who has indicated his/her understanding and acceptance.     Dental advisory given  Plan Discussed with: CRNA  Anesthesia Plan Comments:         Anesthesia Quick Evaluation

## 2023-06-07 NOTE — Progress Notes (Addendum)
 PCP - [facility], NP Cardiologist - Dr. [name]  PPM/ICD - denies   Chest x-ray - 06/07/23 EKG - 05/20/23 Stress Test - 06/25/22 ECHO - 05/24/23 Cardiac Cath - 05/24/23  Sleep Study - denies   DM- denies (prediabetes)  Last dose of GLP1 agonist-  n/a   Blood Thinner Instructions: n/a Aspirin  Instructions: Hold DOS  ERAS Protcol - no, NPO   COVID TEST- n/a   Anesthesia review: yes, cardiac hx. Pt last took Nitroglycerin  on 4/21 around 1630-1700. He was advised to contact us  if he has to take any further doses prior to surgery  Patient denies shortness of breath, fever, cough and chest pain at PAT appointment   All instructions explained to the patient, with a verbal understanding of the material. Patient agrees to go over the instructions while at home for a better understanding. The opportunity to ask questions was provided.

## 2023-06-08 ENCOUNTER — Other Ambulatory Visit: Payer: Self-pay

## 2023-06-08 ENCOUNTER — Inpatient Hospital Stay (HOSPITAL_COMMUNITY)
Admission: RE | Admit: 2023-06-08 | Discharge: 2023-06-15 | DRG: 236 | Disposition: A | Discharge status: Home Health | Attending: Surgery | Admitting: Surgery

## 2023-06-08 ENCOUNTER — Inpatient Hospital Stay (HOSPITAL_COMMUNITY)

## 2023-06-08 ENCOUNTER — Encounter (HOSPITAL_COMMUNITY): Payer: Self-pay | Admitting: Surgery

## 2023-06-08 ENCOUNTER — Inpatient Hospital Stay (HOSPITAL_COMMUNITY): Admitting: Certified Registered Nurse Anesthetist

## 2023-06-08 ENCOUNTER — Encounter (HOSPITAL_COMMUNITY)
Admission: RE | Disposition: A | Discharge status: Home Health | Payer: Self-pay | Source: Home / Self Care | Attending: Surgery

## 2023-06-08 DIAGNOSIS — Z83518 Family history of other specified eye disorder: Secondary | ICD-10-CM

## 2023-06-08 DIAGNOSIS — F32A Depression, unspecified: Secondary | ICD-10-CM | POA: Diagnosis present

## 2023-06-08 DIAGNOSIS — Z7982 Long term (current) use of aspirin: Secondary | ICD-10-CM

## 2023-06-08 DIAGNOSIS — J9811 Atelectasis: Secondary | ICD-10-CM | POA: Diagnosis not present

## 2023-06-08 DIAGNOSIS — R7303 Prediabetes: Secondary | ICD-10-CM | POA: Diagnosis present

## 2023-06-08 DIAGNOSIS — G4733 Obstructive sleep apnea (adult) (pediatric): Secondary | ICD-10-CM | POA: Diagnosis not present

## 2023-06-08 DIAGNOSIS — E785 Hyperlipidemia, unspecified: Secondary | ICD-10-CM | POA: Diagnosis present

## 2023-06-08 DIAGNOSIS — I2510 Atherosclerotic heart disease of native coronary artery without angina pectoris: Secondary | ICD-10-CM

## 2023-06-08 DIAGNOSIS — I252 Old myocardial infarction: Secondary | ICD-10-CM | POA: Diagnosis not present

## 2023-06-08 DIAGNOSIS — Z833 Family history of diabetes mellitus: Secondary | ICD-10-CM

## 2023-06-08 DIAGNOSIS — Z79899 Other long term (current) drug therapy: Secondary | ICD-10-CM | POA: Diagnosis not present

## 2023-06-08 DIAGNOSIS — I493 Ventricular premature depolarization: Secondary | ICD-10-CM | POA: Diagnosis not present

## 2023-06-08 DIAGNOSIS — R110 Nausea: Secondary | ICD-10-CM | POA: Diagnosis not present

## 2023-06-08 DIAGNOSIS — R9389 Abnormal findings on diagnostic imaging of other specified body structures: Secondary | ICD-10-CM | POA: Diagnosis not present

## 2023-06-08 DIAGNOSIS — E669 Obesity, unspecified: Secondary | ICD-10-CM | POA: Diagnosis present

## 2023-06-08 DIAGNOSIS — L299 Pruritus, unspecified: Secondary | ICD-10-CM | POA: Diagnosis not present

## 2023-06-08 DIAGNOSIS — I129 Hypertensive chronic kidney disease with stage 1 through stage 4 chronic kidney disease, or unspecified chronic kidney disease: Secondary | ICD-10-CM | POA: Diagnosis not present

## 2023-06-08 DIAGNOSIS — I471 Supraventricular tachycardia, unspecified: Secondary | ICD-10-CM | POA: Diagnosis not present

## 2023-06-08 DIAGNOSIS — I10 Essential (primary) hypertension: Secondary | ICD-10-CM

## 2023-06-08 DIAGNOSIS — Z83438 Family history of other disorder of lipoprotein metabolism and other lipidemia: Secondary | ICD-10-CM

## 2023-06-08 DIAGNOSIS — I361 Nonrheumatic tricuspid (valve) insufficiency: Secondary | ICD-10-CM | POA: Diagnosis not present

## 2023-06-08 DIAGNOSIS — Z860100 Personal history of colon polyps, unspecified: Secondary | ICD-10-CM

## 2023-06-08 DIAGNOSIS — N179 Acute kidney failure, unspecified: Secondary | ICD-10-CM | POA: Diagnosis not present

## 2023-06-08 DIAGNOSIS — Z951 Presence of aortocoronary bypass graft: Secondary | ICD-10-CM | POA: Diagnosis not present

## 2023-06-08 DIAGNOSIS — K5900 Constipation, unspecified: Secondary | ICD-10-CM | POA: Diagnosis not present

## 2023-06-08 DIAGNOSIS — Z801 Family history of malignant neoplasm of trachea, bronchus and lung: Secondary | ICD-10-CM

## 2023-06-08 DIAGNOSIS — Z818 Family history of other mental and behavioral disorders: Secondary | ICD-10-CM

## 2023-06-08 DIAGNOSIS — Z452 Encounter for adjustment and management of vascular access device: Secondary | ICD-10-CM | POA: Diagnosis not present

## 2023-06-08 DIAGNOSIS — Z87891 Personal history of nicotine dependence: Secondary | ICD-10-CM

## 2023-06-08 DIAGNOSIS — K219 Gastro-esophageal reflux disease without esophagitis: Secondary | ICD-10-CM | POA: Diagnosis not present

## 2023-06-08 DIAGNOSIS — Z807 Family history of other malignant neoplasms of lymphoid, hematopoietic and related tissues: Secondary | ICD-10-CM

## 2023-06-08 DIAGNOSIS — D696 Thrombocytopenia, unspecified: Secondary | ICD-10-CM | POA: Diagnosis not present

## 2023-06-08 DIAGNOSIS — N182 Chronic kidney disease, stage 2 (mild): Secondary | ICD-10-CM | POA: Diagnosis present

## 2023-06-08 DIAGNOSIS — Z6834 Body mass index (BMI) 34.0-34.9, adult: Secondary | ICD-10-CM

## 2023-06-08 DIAGNOSIS — I25119 Atherosclerotic heart disease of native coronary artery with unspecified angina pectoris: Principal | ICD-10-CM | POA: Diagnosis present

## 2023-06-08 DIAGNOSIS — Z825 Family history of asthma and other chronic lower respiratory diseases: Secondary | ICD-10-CM

## 2023-06-08 DIAGNOSIS — D62 Acute posthemorrhagic anemia: Secondary | ICD-10-CM | POA: Diagnosis not present

## 2023-06-08 DIAGNOSIS — Z4682 Encounter for fitting and adjustment of non-vascular catheter: Secondary | ICD-10-CM | POA: Diagnosis not present

## 2023-06-08 DIAGNOSIS — D472 Monoclonal gammopathy: Secondary | ICD-10-CM | POA: Diagnosis present

## 2023-06-08 DIAGNOSIS — Z85828 Personal history of other malignant neoplasm of skin: Secondary | ICD-10-CM

## 2023-06-08 DIAGNOSIS — G609 Hereditary and idiopathic neuropathy, unspecified: Secondary | ICD-10-CM | POA: Diagnosis present

## 2023-06-08 DIAGNOSIS — Z48812 Encounter for surgical aftercare following surgery on the circulatory system: Secondary | ICD-10-CM | POA: Diagnosis not present

## 2023-06-08 DIAGNOSIS — Z955 Presence of coronary angioplasty implant and graft: Secondary | ICD-10-CM | POA: Diagnosis not present

## 2023-06-08 DIAGNOSIS — Z8249 Family history of ischemic heart disease and other diseases of the circulatory system: Secondary | ICD-10-CM

## 2023-06-08 DIAGNOSIS — R0989 Other specified symptoms and signs involving the circulatory and respiratory systems: Secondary | ICD-10-CM | POA: Diagnosis not present

## 2023-06-08 HISTORY — PX: CORONARY ARTERY BYPASS GRAFT: SHX141

## 2023-06-08 HISTORY — PX: INTRAOPERATIVE TRANSESOPHAGEAL ECHOCARDIOGRAM: SHX5062

## 2023-06-08 LAB — POCT I-STAT 7, (LYTES, BLD GAS, ICA,H+H)
Acid-base deficit: 3 mmol/L — ABNORMAL HIGH (ref 0.0–2.0)
Acid-base deficit: 3 mmol/L — ABNORMAL HIGH (ref 0.0–2.0)
Acid-base deficit: 2 mmol/L (ref 0.0–2.0)
Acid-base deficit: 2 mmol/L (ref 0.0–2.0)
Acid-base deficit: 2 mmol/L (ref 0.0–2.0)
Acid-base deficit: 3 mmol/L — ABNORMAL HIGH (ref 0.0–2.0)
Acid-base deficit: 5 mmol/L — ABNORMAL HIGH (ref 0.0–2.0)
Bicarbonate: 23.4 mmol/L (ref 20.0–28.0)
Bicarbonate: 21.7 mmol/L (ref 20.0–28.0)
Bicarbonate: 22.5 mmol/L (ref 20.0–28.0)
Bicarbonate: 22.8 mmol/L (ref 20.0–28.0)
Bicarbonate: 23.5 mmol/L (ref 20.0–28.0)
Bicarbonate: 21.8 mmol/L (ref 20.0–28.0)
Bicarbonate: 21 mmol/L (ref 20.0–28.0)
Calcium, Ion: 1.21 mmol/L (ref 1.15–1.40)
Calcium, Ion: 0.99 mmol/L — ABNORMAL LOW (ref 1.15–1.40)
Calcium, Ion: 1.04 mmol/L — ABNORMAL LOW (ref 1.15–1.40)
Calcium, Ion: 1.08 mmol/L — ABNORMAL LOW (ref 1.15–1.40)
Calcium, Ion: 1.15 mmol/L (ref 1.15–1.40)
Calcium, Ion: 1.06 mmol/L — ABNORMAL LOW (ref 1.15–1.40)
Calcium, Ion: 1.11 mmol/L — ABNORMAL LOW (ref 1.15–1.40)
HCT: 37 % — ABNORMAL LOW (ref 39.0–52.0)
HCT: 26 % — ABNORMAL LOW (ref 39.0–52.0)
HCT: 30 % — ABNORMAL LOW (ref 39.0–52.0)
HCT: 32 % — ABNORMAL LOW (ref 39.0–52.0)
HCT: 33 % — ABNORMAL LOW (ref 39.0–52.0)
HCT: 31 % — ABNORMAL LOW (ref 39.0–52.0)
HCT: 33 % — ABNORMAL LOW (ref 39.0–52.0)
Hemoglobin: 12.6 g/dL — ABNORMAL LOW (ref 13.0–17.0)
Hemoglobin: 8.8 g/dL — ABNORMAL LOW (ref 13.0–17.0)
Hemoglobin: 10.2 g/dL — ABNORMAL LOW (ref 13.0–17.0)
Hemoglobin: 10.9 g/dL — ABNORMAL LOW (ref 13.0–17.0)
Hemoglobin: 11.2 g/dL — ABNORMAL LOW (ref 13.0–17.0)
Hemoglobin: 10.5 g/dL — ABNORMAL LOW (ref 13.0–17.0)
Hemoglobin: 11.2 g/dL — ABNORMAL LOW (ref 13.0–17.0)
O2 Saturation: 100 %
O2 Saturation: 100 %
O2 Saturation: 100 %
O2 Saturation: 99 %
O2 Saturation: 93 %
O2 Saturation: 96 %
O2 Saturation: 94 %
Patient temperature: 36.5
Patient temperature: 36.9
Patient temperature: 37.3
Potassium: 4.2 mmol/L (ref 3.5–5.1)
Potassium: 4.8 mmol/L (ref 3.5–5.1)
Potassium: 4.9 mmol/L (ref 3.5–5.1)
Potassium: 5.9 mmol/L — ABNORMAL HIGH (ref 3.5–5.1)
Potassium: 4.1 mmol/L (ref 3.5–5.1)
Potassium: 4.5 mmol/L (ref 3.5–5.1)
Potassium: 4.5 mmol/L (ref 3.5–5.1)
Sodium: 137 mmol/L (ref 135–145)
Sodium: 130 mmol/L — ABNORMAL LOW (ref 135–145)
Sodium: 135 mmol/L (ref 135–145)
Sodium: 135 mmol/L (ref 135–145)
Sodium: 135 mmol/L (ref 135–145)
Sodium: 138 mmol/L (ref 135–145)
Sodium: 136 mmol/L (ref 135–145)
TCO2: 25 mmol/L (ref 22–32)
TCO2: 23 mmol/L (ref 22–32)
TCO2: 24 mmol/L (ref 22–32)
TCO2: 24 mmol/L (ref 22–32)
TCO2: 25 mmol/L (ref 22–32)
TCO2: 23 mmol/L (ref 22–32)
TCO2: 22 mmol/L (ref 22–32)
pCO2 arterial: 45 mmHg (ref 32–48)
pCO2 arterial: 38.7 mmHg (ref 32–48)
pCO2 arterial: 35.5 mmHg (ref 32–48)
pCO2 arterial: 35.9 mmHg (ref 32–48)
pCO2 arterial: 39.2 mmHg (ref 32–48)
pCO2 arterial: 38.9 mmHg (ref 32–48)
pCO2 arterial: 41 mmHg (ref 32–48)
pH, Arterial: 7.324 — ABNORMAL LOW (ref 7.35–7.45)
pH, Arterial: 7.358 (ref 7.35–7.45)
pH, Arterial: 7.409 (ref 7.35–7.45)
pH, Arterial: 7.411 (ref 7.35–7.45)
pH, Arterial: 7.383 (ref 7.35–7.45)
pH, Arterial: 7.357 (ref 7.35–7.45)
pH, Arterial: 7.319 — ABNORMAL LOW (ref 7.35–7.45)
pO2, Arterial: 391 mmHg — ABNORMAL HIGH (ref 83–108)
pO2, Arterial: 427 mmHg — ABNORMAL HIGH (ref 83–108)
pO2, Arterial: 113 mmHg — ABNORMAL HIGH (ref 83–108)
pO2, Arterial: 306 mmHg — ABNORMAL HIGH (ref 83–108)
pO2, Arterial: 67 mmHg — ABNORMAL LOW (ref 83–108)
pO2, Arterial: 84 mmHg (ref 83–108)
pO2, Arterial: 79 mmHg — ABNORMAL LOW (ref 83–108)

## 2023-06-08 LAB — CBC
HCT: 35.5 % — ABNORMAL LOW (ref 39.0–52.0)
HCT: 32.8 % — ABNORMAL LOW (ref 39.0–52.0)
Hemoglobin: 11.6 g/dL — ABNORMAL LOW (ref 13.0–17.0)
Hemoglobin: 10.8 g/dL — ABNORMAL LOW (ref 13.0–17.0)
MCH: 31 pg (ref 26.0–34.0)
MCH: 31.6 pg (ref 26.0–34.0)
MCHC: 32.7 g/dL (ref 30.0–36.0)
MCHC: 32.9 g/dL (ref 30.0–36.0)
MCV: 94.9 fL (ref 80.0–100.0)
MCV: 95.9 fL (ref 80.0–100.0)
Platelets: 112 10*3/uL — ABNORMAL LOW (ref 150–400)
Platelets: 105 10*3/uL — ABNORMAL LOW (ref 150–400)
RBC: 3.74 MIL/uL — ABNORMAL LOW (ref 4.22–5.81)
RBC: 3.42 MIL/uL — ABNORMAL LOW (ref 4.22–5.81)
RDW: 13.3 % (ref 11.5–15.5)
RDW: 13.4 % (ref 11.5–15.5)
WBC: 11.8 10*3/uL — ABNORMAL HIGH (ref 4.0–10.5)
WBC: 8.8 10*3/uL (ref 4.0–10.5)
nRBC: 0 % (ref 0.0–0.2)
nRBC: 0 % (ref 0.0–0.2)

## 2023-06-08 LAB — POCT I-STAT, CHEM 8
BUN: 25 mg/dL — ABNORMAL HIGH (ref 8–23)
BUN: 27 mg/dL — ABNORMAL HIGH (ref 8–23)
BUN: 22 mg/dL (ref 8–23)
BUN: 25 mg/dL — ABNORMAL HIGH (ref 8–23)
Calcium, Ion: 1.22 mmol/L (ref 1.15–1.40)
Calcium, Ion: 1.17 mmol/L (ref 1.15–1.40)
Calcium, Ion: 1.06 mmol/L — ABNORMAL LOW (ref 1.15–1.40)
Calcium, Ion: 1.02 mmol/L — ABNORMAL LOW (ref 1.15–1.40)
Chloride: 103 mmol/L (ref 98–111)
Chloride: 103 mmol/L (ref 98–111)
Chloride: 102 mmol/L (ref 98–111)
Chloride: 101 mmol/L (ref 98–111)
Creatinine, Ser: 1 mg/dL (ref 0.61–1.24)
Creatinine, Ser: 1 mg/dL (ref 0.61–1.24)
Creatinine, Ser: 0.9 mg/dL (ref 0.61–1.24)
Creatinine, Ser: 0.8 mg/dL (ref 0.61–1.24)
Glucose, Bld: 123 mg/dL — ABNORMAL HIGH (ref 70–99)
Glucose, Bld: 138 mg/dL — ABNORMAL HIGH (ref 70–99)
Glucose, Bld: 156 mg/dL — ABNORMAL HIGH (ref 70–99)
Glucose, Bld: 164 mg/dL — ABNORMAL HIGH (ref 70–99)
HCT: 37 % — ABNORMAL LOW (ref 39.0–52.0)
HCT: 33 % — ABNORMAL LOW (ref 39.0–52.0)
HCT: 28 % — ABNORMAL LOW (ref 39.0–52.0)
HCT: 27 % — ABNORMAL LOW (ref 39.0–52.0)
Hemoglobin: 12.6 g/dL — ABNORMAL LOW (ref 13.0–17.0)
Hemoglobin: 11.2 g/dL — ABNORMAL LOW (ref 13.0–17.0)
Hemoglobin: 9.5 g/dL — ABNORMAL LOW (ref 13.0–17.0)
Hemoglobin: 9.2 g/dL — ABNORMAL LOW (ref 13.0–17.0)
Potassium: 4.2 mmol/L (ref 3.5–5.1)
Potassium: 4.9 mmol/L (ref 3.5–5.1)
Potassium: 5.9 mmol/L — ABNORMAL HIGH (ref 3.5–5.1)
Potassium: 4.8 mmol/L (ref 3.5–5.1)
Sodium: 138 mmol/L (ref 135–145)
Sodium: 136 mmol/L (ref 135–145)
Sodium: 133 mmol/L — ABNORMAL LOW (ref 135–145)
Sodium: 134 mmol/L — ABNORMAL LOW (ref 135–145)
TCO2: 26 mmol/L (ref 22–32)
TCO2: 25 mmol/L (ref 22–32)
TCO2: 25 mmol/L (ref 22–32)
TCO2: 24 mmol/L (ref 22–32)

## 2023-06-08 LAB — GLUCOSE, CAPILLARY
Glucose-Capillary: 121 mg/dL — ABNORMAL HIGH (ref 70–99)
Glucose-Capillary: 132 mg/dL — ABNORMAL HIGH (ref 70–99)
Glucose-Capillary: 105 mg/dL — ABNORMAL HIGH (ref 70–99)
Glucose-Capillary: 122 mg/dL — ABNORMAL HIGH (ref 70–99)
Glucose-Capillary: 123 mg/dL — ABNORMAL HIGH (ref 70–99)
Glucose-Capillary: 137 mg/dL — ABNORMAL HIGH (ref 70–99)
Glucose-Capillary: 109 mg/dL — ABNORMAL HIGH (ref 70–99)
Glucose-Capillary: 122 mg/dL — ABNORMAL HIGH (ref 70–99)
Glucose-Capillary: 120 mg/dL — ABNORMAL HIGH (ref 70–99)

## 2023-06-08 LAB — BASIC METABOLIC PANEL WITH GFR
Anion gap: 5 (ref 5–15)
BUN: 20 mg/dL (ref 8–23)
CO2: 23 mmol/L (ref 22–32)
Calcium: 7.5 mg/dL — ABNORMAL LOW (ref 8.9–10.3)
Chloride: 107 mmol/L (ref 98–111)
Creatinine, Ser: 0.99 mg/dL (ref 0.61–1.24)
GFR, Estimated: >60 mL/min (ref >60)
Glucose, Bld: 156 mg/dL — ABNORMAL HIGH (ref 70–99)
Potassium: 4.4 mmol/L (ref 3.5–5.1)
Sodium: 135 mmol/L (ref 135–145)

## 2023-06-08 LAB — HEMOGLOBIN AND HEMATOCRIT, BLOOD
HCT: 30 % — ABNORMAL LOW (ref 39.0–52.0)
Hemoglobin: 9.9 g/dL — ABNORMAL LOW (ref 13.0–17.0)

## 2023-06-08 LAB — POCT I-STAT EG7
Acid-base deficit: 1 mmol/L (ref 0.0–2.0)
Bicarbonate: 24.7 mmol/L (ref 20.0–28.0)
Calcium, Ion: 1.03 mmol/L — ABNORMAL LOW (ref 1.15–1.40)
HCT: 27 % — ABNORMAL LOW (ref 39.0–52.0)
Hemoglobin: 9.2 g/dL — ABNORMAL LOW (ref 13.0–17.0)
O2 Saturation: 86 %
Potassium: 5.6 mmol/L — ABNORMAL HIGH (ref 3.5–5.1)
Sodium: 134 mmol/L — ABNORMAL LOW (ref 135–145)
TCO2: 26 mmol/L (ref 22–32)
pCO2, Ven: 43.6 mmHg — ABNORMAL LOW (ref 44–60)
pH, Ven: 7.361 (ref 7.25–7.43)
pO2, Ven: 54 mmHg — ABNORMAL HIGH (ref 32–45)

## 2023-06-08 LAB — ABO/RH: ABO/RH(D): A POS

## 2023-06-08 LAB — ECHO INTRAOPERATIVE TEE
Height: 67.5 in
Weight: 3366.41 [oz_av]

## 2023-06-08 LAB — APTT: aPTT: 30 s (ref 24–36)

## 2023-06-08 LAB — PROTIME-INR
INR: 1.3 — ABNORMAL HIGH (ref 0.8–1.2)
Prothrombin Time: 16.6 s — ABNORMAL HIGH (ref 11.4–15.2)

## 2023-06-08 LAB — MAGNESIUM: Magnesium: 2.7 mg/dL — ABNORMAL HIGH (ref 1.7–2.4)

## 2023-06-08 LAB — PLATELET COUNT: Platelets: 114 10*3/uL — ABNORMAL LOW (ref 150–400)

## 2023-06-08 SURGERY — CORONARY ARTERY BYPASS GRAFTING (CABG)
Anesthesia: General | Site: Chest

## 2023-06-08 MED ORDER — 0.9 % SODIUM CHLORIDE (POUR BTL) OPTIME
TOPICAL | Status: DC | PRN
  Administered 2023-06-08: 5000 mL

## 2023-06-08 MED ORDER — METOPROLOL TARTRATE 12.5 MG HALF TABLET
12.5000 mg | ORAL_TABLET | Freq: Once | ORAL | Status: DC
Start: 2023-06-08 — End: 2023-06-08

## 2023-06-08 MED ORDER — PROPOFOL 10 MG/ML IV BOLUS
INTRAVENOUS | Status: AC
  Filled 2023-06-08: qty 20

## 2023-06-08 MED ORDER — SODIUM CHLORIDE 0.9 % IV SOLN: 250.0000 mL | INTRAVENOUS | Status: AC

## 2023-06-08 MED ORDER — INSULIN REGULAR(HUMAN) IN NACL 100-0.9 UT/100ML-% IV SOLN
INTRAVENOUS | Status: DC
  Administered 2023-06-08: 0.9 [IU]/h via INTRAVENOUS

## 2023-06-08 MED ORDER — METOCLOPRAMIDE HCL 5 MG/ML IJ SOLN
10.0000 mg | Freq: Four times a day (QID) | INTRAMUSCULAR | Status: AC
  Administered 2023-06-08 – 2023-06-09 (×6): 10 mg via INTRAVENOUS
  Filled 2023-06-08 (×6): qty 2

## 2023-06-08 MED ORDER — BISACODYL 10 MG RE SUPP: 10.0000 mg | Freq: Every day | RECTAL | Status: DC

## 2023-06-08 MED ORDER — TRAMADOL HCL 50 MG PO TABS
50.0000 mg | ORAL_TABLET | ORAL | Status: DC | PRN
  Administered 2023-06-08 – 2023-06-09 (×4): 100 mg via ORAL
  Administered 2023-06-13: 50 mg via ORAL
  Filled 2023-06-08 (×3): qty 2
  Filled 2023-06-08: qty 1
  Filled 2023-06-08: qty 2

## 2023-06-08 MED ORDER — CHLORHEXIDINE GLUCONATE 0.12 % MT SOLN
15.0000 mL | OROMUCOSAL | Status: AC
  Administered 2023-06-08: 15 mL via OROMUCOSAL
  Filled 2023-06-08: qty 15

## 2023-06-08 MED ORDER — DEXTROSE 50 % IV SOLN: 0.0000 mL | INTRAVENOUS | Status: DC | PRN

## 2023-06-08 MED ORDER — ONDANSETRON HCL 4 MG/2ML IJ SOLN
4.0000 mg | Freq: Four times a day (QID) | INTRAMUSCULAR | Status: DC | PRN
  Administered 2023-06-10 – 2023-06-14 (×4): 4 mg via INTRAVENOUS
  Filled 2023-06-08 (×4): qty 2

## 2023-06-08 MED ORDER — PHENYLEPHRINE 80 MCG/ML (10ML) SYRINGE FOR IV PUSH (FOR BLOOD PRESSURE SUPPORT)
PREFILLED_SYRINGE | INTRAVENOUS | Status: DC | PRN
  Administered 2023-06-08: 160 ug via INTRAVENOUS
  Administered 2023-06-08 (×2): 80 ug via INTRAVENOUS

## 2023-06-08 MED ORDER — SODIUM CHLORIDE 0.9 % IV SOLN: INTRAVENOUS | Status: AC

## 2023-06-08 MED ORDER — PANTOPRAZOLE SODIUM 40 MG PO TBEC
40.0000 mg | DELAYED_RELEASE_TABLET | Freq: Every day | ORAL | Status: DC
  Administered 2023-06-10 – 2023-06-15 (×6): 40 mg via ORAL
  Filled 2023-06-08 (×6): qty 1

## 2023-06-08 MED ORDER — ACETAMINOPHEN 500 MG PO TABS
1000.0000 mg | ORAL_TABLET | Freq: Four times a day (QID) | ORAL | Status: AC
  Administered 2023-06-08 – 2023-06-13 (×18): 1000 mg via ORAL
  Filled 2023-06-08 (×18): qty 2

## 2023-06-08 MED ORDER — SODIUM CHLORIDE 0.9% FLUSH: 3.0000 mL | INTRAVENOUS | Status: DC | PRN

## 2023-06-08 MED ORDER — LACTATED RINGERS IV SOLN: INTRAVENOUS | Status: DC | PRN

## 2023-06-08 MED ORDER — SODIUM CHLORIDE 0.45 % IV SOLN: INTRAVENOUS | Status: AC | PRN

## 2023-06-08 MED ORDER — MIDAZOLAM HCL 2 MG/2ML IJ SOLN: 2.0000 mg | INTRAMUSCULAR | Status: DC | PRN

## 2023-06-08 MED ORDER — HEPARIN SODIUM (PORCINE) 1000 UNIT/ML IJ SOLN
INTRAMUSCULAR | Status: DC | PRN
  Administered 2023-06-08: 38000 [IU] via INTRAVENOUS

## 2023-06-08 MED ORDER — ATORVASTATIN CALCIUM 80 MG PO TABS: 80.0000 mg | ORAL_TABLET | Freq: Every day | ORAL | Status: DC

## 2023-06-08 MED ORDER — OXYCODONE HCL 5 MG PO TABS
5.0000 mg | ORAL_TABLET | ORAL | Status: DC | PRN
  Administered 2023-06-08 – 2023-06-09 (×5): 10 mg via ORAL
  Administered 2023-06-10: 5 mg via ORAL
  Filled 2023-06-08: qty 1
  Filled 2023-06-08 (×5): qty 2

## 2023-06-08 MED ORDER — METOPROLOL TARTRATE 5 MG/5ML IV SOLN
2.5000 mg | INTRAVENOUS | Status: DC | PRN
  Administered 2023-06-09 (×2): 2.5 mg via INTRAVENOUS
  Administered 2023-06-13: 5 mg via INTRAVENOUS
  Filled 2023-06-08 (×2): qty 5

## 2023-06-08 MED ORDER — ORAL CARE MOUTH RINSE: 15.0000 mL | OROMUCOSAL | Status: DC | PRN

## 2023-06-08 MED ORDER — MIDAZOLAM HCL (PF) 10 MG/2ML IJ SOLN
INTRAMUSCULAR | Status: AC
  Filled 2023-06-08: qty 2

## 2023-06-08 MED ORDER — METOPROLOL TARTRATE 12.5 MG HALF TABLET: 12.5000 mg | ORAL_TABLET | Freq: Two times a day (BID) | ORAL | Status: DC

## 2023-06-08 MED ORDER — ROCURONIUM BROMIDE 10 MG/ML (PF) SYRINGE
PREFILLED_SYRINGE | INTRAVENOUS | Status: DC | PRN
  Administered 2023-06-08: 50 mg via INTRAVENOUS
  Administered 2023-06-08: 100 mg via INTRAVENOUS
  Administered 2023-06-08: 50 mg via INTRAVENOUS

## 2023-06-08 MED ORDER — FENTANYL CITRATE (PF) 250 MCG/5ML IJ SOLN
INTRAMUSCULAR | Status: AC
  Filled 2023-06-08: qty 5

## 2023-06-08 MED ORDER — MIDAZOLAM HCL (PF) 5 MG/ML IJ SOLN
INTRAMUSCULAR | Status: DC | PRN
  Administered 2023-06-08 (×5): 2 mg via INTRAVENOUS

## 2023-06-08 MED ORDER — ASPIRIN 325 MG PO TBEC
325.0000 mg | DELAYED_RELEASE_TABLET | Freq: Every day | ORAL | Status: DC
  Administered 2023-06-09 – 2023-06-15 (×7): 325 mg via ORAL
  Filled 2023-06-08 (×7): qty 1

## 2023-06-08 MED ORDER — SODIUM CHLORIDE 0.9 % IV SOLN: INTRAVENOUS | Status: DC | PRN

## 2023-06-08 MED ORDER — SERTRALINE HCL 50 MG PO TABS: 50.0000 mg | ORAL_TABLET | Freq: Every day | ORAL | Status: DC

## 2023-06-08 MED ORDER — THROMBIN (RECOMBINANT) 20000 UNITS EX SOLR
CUTANEOUS | Status: AC
  Filled 2023-06-08: qty 20000

## 2023-06-08 MED ORDER — ACETAMINOPHEN 160 MG/5ML PO SOLN: 650.0000 mg | Freq: Once | ORAL | Status: DC

## 2023-06-08 MED ORDER — ROCURONIUM BROMIDE 10 MG/ML (PF) SYRINGE
PREFILLED_SYRINGE | INTRAVENOUS | Status: AC
  Filled 2023-06-08: qty 10

## 2023-06-08 MED ORDER — CEFAZOLIN SODIUM-DEXTROSE 2-4 GM/100ML-% IV SOLN
2.0000 g | Freq: Three times a day (TID) | INTRAVENOUS | Status: AC
  Administered 2023-06-08 – 2023-06-10 (×6): 2 g via INTRAVENOUS
  Filled 2023-06-08 (×6): qty 100

## 2023-06-08 MED ORDER — PROTAMINE SULFATE 10 MG/ML IV SOLN
INTRAVENOUS | Status: AC
  Filled 2023-06-08: qty 25

## 2023-06-08 MED ORDER — VANCOMYCIN HCL IN DEXTROSE 1-5 GM/200ML-% IV SOLN
1000.0000 mg | Freq: Once | INTRAVENOUS | Status: AC
  Administered 2023-06-08: 1000 mg via INTRAVENOUS
  Filled 2023-06-08: qty 200

## 2023-06-08 MED ORDER — ACETAMINOPHEN 160 MG/5ML PO SOLN: 1000.0000 mg | Freq: Four times a day (QID) | ORAL | Status: DC

## 2023-06-08 MED ORDER — DOCUSATE SODIUM 100 MG PO CAPS
200.0000 mg | ORAL_CAPSULE | Freq: Every day | ORAL | Status: DC
  Administered 2023-06-09 – 2023-06-15 (×6): 200 mg via ORAL
  Filled 2023-06-08 (×6): qty 2

## 2023-06-08 MED ORDER — NITROGLYCERIN IN D5W 200-5 MCG/ML-% IV SOLN
0.0000 ug/min | INTRAVENOUS | Status: DC
  Administered 2023-06-09: 5 ug/min via INTRAVENOUS

## 2023-06-08 MED ORDER — CHLORHEXIDINE GLUCONATE CLOTH 2 % EX PADS
6.0000 | MEDICATED_PAD | Freq: Every day | CUTANEOUS | Status: DC
  Administered 2023-06-08 – 2023-06-11 (×4): 6 via TOPICAL

## 2023-06-08 MED ORDER — FENTANYL CITRATE (PF) 250 MCG/5ML IJ SOLN
INTRAMUSCULAR | Status: AC
Start: 2023-06-08 — End: ?
  Filled 2023-06-08: qty 5

## 2023-06-08 MED ORDER — ~~LOC~~ CARDIAC SURGERY, PATIENT & FAMILY EDUCATION
Freq: Once | Status: DC
  Filled 2023-06-08: qty 1

## 2023-06-08 MED ORDER — THROMBIN 20000 UNITS EX SOLR: CUTANEOUS | Status: DC | PRN

## 2023-06-08 MED ORDER — ASPIRIN 81 MG PO CHEW
324.0000 mg | CHEWABLE_TABLET | Freq: Every day | ORAL | Status: DC
  Filled 2023-06-08: qty 4

## 2023-06-08 MED ORDER — LACTATED RINGERS IV SOLN: INTRAVENOUS | Status: AC

## 2023-06-08 MED ORDER — POTASSIUM CHLORIDE 10 MEQ/50ML IV SOLN: 10.0000 meq | INTRAVENOUS | Status: AC

## 2023-06-08 MED ORDER — ALBUMIN HUMAN 5 % IV SOLN
250.0000 mL | INTRAVENOUS | Status: DC | PRN
  Administered 2023-06-08 (×3): 12.5 g via INTRAVENOUS
  Filled 2023-06-08 (×2): qty 250

## 2023-06-08 MED ORDER — CHLORHEXIDINE GLUCONATE 4 % EX SOLN: 30.0000 mL | CUTANEOUS | Status: DC

## 2023-06-08 MED ORDER — MORPHINE SULFATE (PF) 2 MG/ML IV SOLN
1.0000 mg | INTRAVENOUS | Status: DC | PRN
  Administered 2023-06-08: 2 mg via INTRAVENOUS
  Administered 2023-06-08 – 2023-06-09 (×3): 4 mg via INTRAVENOUS
  Administered 2023-06-09 (×2): 2 mg via INTRAVENOUS
  Administered 2023-06-09: 4 mg via INTRAVENOUS
  Filled 2023-06-08: qty 2
  Filled 2023-06-08: qty 1
  Filled 2023-06-08 (×2): qty 2
  Filled 2023-06-08 (×2): qty 1
  Filled 2023-06-08: qty 2

## 2023-06-08 MED ORDER — ASPIRIN 81 MG PO CHEW
324.0000 mg | CHEWABLE_TABLET | Freq: Once | ORAL | Status: AC
  Administered 2023-06-08: 324 mg via ORAL
  Filled 2023-06-08: qty 4

## 2023-06-08 MED ORDER — PROPOFOL 10 MG/ML IV BOLUS
INTRAVENOUS | Status: DC | PRN
  Administered 2023-06-08: 70 mg via INTRAVENOUS
  Administered 2023-06-08: 50 mg via INTRAVENOUS

## 2023-06-08 MED ORDER — HEMOSTATIC AGENTS (NO CHARGE) OPTIME
TOPICAL | Status: DC | PRN
  Administered 2023-06-08: 1 via TOPICAL

## 2023-06-08 MED ORDER — BISACODYL 5 MG PO TBEC
10.0000 mg | DELAYED_RELEASE_TABLET | Freq: Every day | ORAL | Status: DC
  Administered 2023-06-09 – 2023-06-10 (×2): 10 mg via ORAL
  Filled 2023-06-08 (×2): qty 2

## 2023-06-08 MED ORDER — SODIUM CHLORIDE 0.9% FLUSH
3.0000 mL | Freq: Two times a day (BID) | INTRAVENOUS | Status: DC
  Administered 2023-06-09 – 2023-06-15 (×13): 3 mL via INTRAVENOUS

## 2023-06-08 MED ORDER — PANTOPRAZOLE SODIUM 40 MG IV SOLR
40.0000 mg | Freq: Every day | INTRAVENOUS | Status: AC
  Administered 2023-06-08 – 2023-06-09 (×2): 40 mg via INTRAVENOUS
  Filled 2023-06-08 (×2): qty 10

## 2023-06-08 MED ORDER — METOPROLOL TARTRATE 25 MG/10 ML ORAL SUSPENSION: 12.5000 mg | Freq: Two times a day (BID) | ORAL | Status: DC

## 2023-06-08 MED ORDER — PROTAMINE SULFATE 10 MG/ML IV SOLN
INTRAVENOUS | Status: DC | PRN
  Administered 2023-06-08: 350 mg via INTRAVENOUS

## 2023-06-08 MED ORDER — DEXMEDETOMIDINE HCL IN NACL 400 MCG/100ML IV SOLN
0.0000 ug/kg/h | INTRAVENOUS | Status: DC
  Administered 2023-06-08: 0.7 ug/kg/h via INTRAVENOUS

## 2023-06-08 MED ORDER — PHENYLEPHRINE HCL-NACL 20-0.9 MG/250ML-% IV SOLN: 0.0000 ug/min | INTRAVENOUS | Status: DC

## 2023-06-08 MED ORDER — CHLORHEXIDINE GLUCONATE 0.12 % MT SOLN
15.0000 mL | Freq: Once | OROMUCOSAL | Status: AC
  Administered 2023-06-08: 15 mL via OROMUCOSAL
  Filled 2023-06-08: qty 15

## 2023-06-08 MED ORDER — EPHEDRINE SULFATE-NACL 50-0.9 MG/10ML-% IV SOSY
PREFILLED_SYRINGE | INTRAVENOUS | Status: DC | PRN
  Administered 2023-06-08 (×3): 5 mg via INTRAVENOUS

## 2023-06-08 MED ORDER — EZETIMIBE 10 MG PO TABS
10.0000 mg | ORAL_TABLET | Freq: Every day | ORAL | Status: DC
Start: 2023-06-09 — End: 2023-06-15
  Administered 2023-06-09 – 2023-06-15 (×7): 10 mg via ORAL
  Filled 2023-06-08 (×7): qty 1

## 2023-06-08 MED ORDER — FENTANYL CITRATE (PF) 250 MCG/5ML IJ SOLN
INTRAMUSCULAR | Status: DC | PRN
  Administered 2023-06-08 (×2): 50 ug via INTRAVENOUS
  Administered 2023-06-08: 150 ug via INTRAVENOUS
  Administered 2023-06-08: 100 ug via INTRAVENOUS
  Administered 2023-06-08: 200 ug via INTRAVENOUS
  Administered 2023-06-08: 150 ug via INTRAVENOUS
  Administered 2023-06-08: 100 ug via INTRAVENOUS
  Administered 2023-06-08: 200 ug via INTRAVENOUS

## 2023-06-08 MED ORDER — PLASMA-LYTE A IV SOLN: INTRAVENOUS | Status: DC | PRN

## 2023-06-08 MED ORDER — MAGNESIUM SULFATE 4 GM/100ML IV SOLN
4.0000 g | Freq: Once | INTRAVENOUS | Status: AC
  Administered 2023-06-08: 4 g via INTRAVENOUS
  Filled 2023-06-08: qty 100

## 2023-06-08 MED ORDER — HEPARIN SODIUM (PORCINE) 1000 UNIT/ML IJ SOLN
INTRAMUSCULAR | Status: AC
  Filled 2023-06-08: qty 1

## 2023-06-08 SURGICAL SUPPLY — 93 items
BAG DECANTER FOR FLEXI CONT (MISCELLANEOUS) ×3 IMPLANT
BLADE CLIPPER SURG (BLADE) ×3 IMPLANT
BLADE MINI RND TIP GREEN BEAV (BLADE) ×1 IMPLANT
BLADE STERNUM SYSTEM 6 (BLADE) ×3 IMPLANT
BLADE SURG 11 STRL SS (BLADE) ×1 IMPLANT
BLADE SURG 15 STRL LF DISP TIS (BLADE) ×3 IMPLANT
BNDG ELASTIC 4INX 5YD STR LF (GAUZE/BANDAGES/DRESSINGS) ×1 IMPLANT
BNDG ELASTIC 6INX 5YD STR LF (GAUZE/BANDAGES/DRESSINGS) ×3 IMPLANT
BNDG GAUZE DERMACEA FLUFF 4 (GAUZE/BANDAGES/DRESSINGS) ×3 IMPLANT
CANISTER SUCT 3000ML PPV (MISCELLANEOUS) ×3 IMPLANT
CANNULA ARTERIAL VENT 3/8 20FR (CANNULA) ×1 IMPLANT
CANNULA MC2 2 STG 36/46 CONN (CANNULA) ×1 IMPLANT
CATH ROBINSON RED A/P 18FR (CATHETERS) ×6 IMPLANT
CATH THORACIC 28FR (CATHETERS) ×3 IMPLANT
CATH THORACIC 36FR (CATHETERS) ×3 IMPLANT
CATH THORACIC 36FR RT ANG (CATHETERS) ×3 IMPLANT
CLIP TI WIDE RED SMALL 24 (CLIP) ×2 IMPLANT
CONTAINER PROTECT SURGISLUSH (MISCELLANEOUS) ×6 IMPLANT
COVER MAYO STAND STRL (DRAPES) ×3 IMPLANT
CUFF TOURN SGL QUICK 18X4 (TOURNIQUET CUFF) IMPLANT
CUFF TRNQT CYL 24X4X16.5-23 (TOURNIQUET CUFF) IMPLANT
DERMABOND ADVANCED .7 DNX6 (GAUZE/BANDAGES/DRESSINGS) ×1 IMPLANT
DRAPE EXTREMITY T 121X128X90 (DISPOSABLE) ×2 IMPLANT
DRAPE HALF SHEET 40X57 (DRAPES) ×3 IMPLANT
DRAPE SRG 135X102X78XABS (DRAPES) ×3 IMPLANT
DRAPE WARM FLUID 44X44 (DRAPES) ×3 IMPLANT
DRSG COVADERM 4X14 (GAUZE/BANDAGES/DRESSINGS) ×3 IMPLANT
ELECT CAUTERY BLADE 6.4 (BLADE) ×3 IMPLANT
ELECTRODE BLDE 4.0 EZ CLN MEGD (MISCELLANEOUS) ×1 IMPLANT
ELECTRODE REM PT RTRN 9FT ADLT (ELECTROSURGICAL) ×6 IMPLANT
FELT TEFLON 1X6 (MISCELLANEOUS) ×3 IMPLANT
GAUZE 4X4 16PLY ~~LOC~~+RFID DBL (SPONGE) ×2 IMPLANT
GAUZE SPONGE 4X4 12PLY STRL (GAUZE/BANDAGES/DRESSINGS) ×4 IMPLANT
GAUZE SPONGE 4X4 12PLY STRL LF (GAUZE/BANDAGES/DRESSINGS) ×1 IMPLANT
GEL ULTRASOUND 20GR AQUASONIC (MISCELLANEOUS) ×2 IMPLANT
GLOVE BIO SURGEON STRL SZ 6 (GLOVE) IMPLANT
GLOVE BIO SURGEON STRL SZ 6.5 (GLOVE) IMPLANT
GLOVE BIOGEL PI IND STRL 6.5 (GLOVE) IMPLANT
GLOVE BIOGEL PI IND STRL 7.0 (GLOVE) IMPLANT
GLOVE ECLIPSE 7.0 STRL STRAW (GLOVE) ×6 IMPLANT
GLOVE ORTHO TXT STRL SZ7.5 (GLOVE) IMPLANT
GOWN STRL REUS W/ TWL LRG LVL3 (GOWN DISPOSABLE) ×12 IMPLANT
GOWN STRL REUS W/ TWL XL LVL3 (GOWN DISPOSABLE) ×3 IMPLANT
HEMOSTAT POWDER SURGIFOAM 1G (HEMOSTASIS) ×9 IMPLANT
HEMOSTAT SURGICEL 2X14 (HEMOSTASIS) ×3 IMPLANT
KIT BASIN OR (CUSTOM PROCEDURE TRAY) ×3 IMPLANT
KIT SUCTION CATH 14FR (SUCTIONS) ×3 IMPLANT
KIT TURNOVER KIT B (KITS) ×3 IMPLANT
KIT VASOVIEW HEMOPRO 2 VH 4000 (KITS) ×3 IMPLANT
KNIFE MICRO-UNI 3.5 30 DEG (BLADE) ×4 IMPLANT
NS IRRIG 1000ML POUR BTL (IV SOLUTION) ×15 IMPLANT
PACK E OPEN HEART (SUTURE) ×3 IMPLANT
PACK OPEN HEART (CUSTOM PROCEDURE TRAY) ×3 IMPLANT
PAD ARMBOARD POSITIONER FOAM (MISCELLANEOUS) ×6 IMPLANT
PAD ELECT DEFIB RADIOL ZOLL (MISCELLANEOUS) ×3 IMPLANT
PENCIL BUTTON HOLSTER BLD 10FT (ELECTRODE) ×3 IMPLANT
POSITIONER HEAD DONUT 9IN (MISCELLANEOUS) ×3 IMPLANT
PUNCH AORTIC ROTATE 4.0MM (MISCELLANEOUS) ×1 IMPLANT
PUNCH AORTIC ROTATE 4.5MM 8IN (MISCELLANEOUS) ×3 IMPLANT
SET MPS 3-ND DEL (MISCELLANEOUS) ×1 IMPLANT
SHEARS HARMONIC 9CM CVD (BLADE) ×2 IMPLANT
SOLUTION ANTFG W/FOAM PAD STRL (MISCELLANEOUS) ×1 IMPLANT
SPONGE T-LAP 18X18 ~~LOC~~+RFID (SPONGE) ×10 IMPLANT
SPONGE T-LAP 4X18 ~~LOC~~+RFID (SPONGE) ×3 IMPLANT
STOPCOCK 4 WAY LG BORE MALE ST (IV SETS) ×1 IMPLANT
SUPPORT HEART JANKE-BARRON (MISCELLANEOUS) ×3 IMPLANT
SUT BONE WAX W31G (SUTURE) ×3 IMPLANT
SUT MNCRL AB 4-0 PS2 18 (SUTURE) ×1 IMPLANT
SUT PROLENE 3 0 SH1 36 (SUTURE) ×3 IMPLANT
SUT PROLENE 4 0 SH DA (SUTURE) IMPLANT
SUT PROLENE 4-0 RB1 .5 CRCL 36 (SUTURE) IMPLANT
SUT PROLENE 5 0 C 1 36 (SUTURE) IMPLANT
SUT PROLENE 6 0 C 1 30 (SUTURE) ×1 IMPLANT
SUT PROLENE 7 0 BV 1 (SUTURE) ×1 IMPLANT
SUT PROLENE 7 0 BV1 MDA (SUTURE) ×4 IMPLANT
SUT PROLENE 8 0 BV175 6 (SUTURE) IMPLANT
SUT SILK 1 MH (SUTURE) IMPLANT
SUT SILK 2 0 SH CR/8 (SUTURE) ×1 IMPLANT
SUT STEEL STERNAL CCS#1 18IN (SUTURE) IMPLANT
SUT STEEL SZ 6 DBL 3X14 BALL (SUTURE) ×3 IMPLANT
SUT VIC AB 1 CTX36XBRD ANBCTR (SUTURE) ×7 IMPLANT
SUT VIC AB 2-0 CT1 TAPERPNT 27 (SUTURE) ×1 IMPLANT
SUT VIC AB 3-0 SH 27X BRD (SUTURE) IMPLANT
SUT VIC AB 3-0 X1 27 (SUTURE) ×3 IMPLANT
SYSTEM SAHARA CHEST DRAIN ATS (WOUND CARE) ×3 IMPLANT
TAPE CLOTH SURG 4X10 WHT LF (GAUZE/BANDAGES/DRESSINGS) ×1 IMPLANT
TAPE PAPER 2X10 WHT MICROPORE (GAUZE/BANDAGES/DRESSINGS) ×1 IMPLANT
TOWEL GREEN STERILE (TOWEL DISPOSABLE) ×3 IMPLANT
TOWEL GREEN STERILE FF (TOWEL DISPOSABLE) ×3 IMPLANT
TRAY FOLEY SLVR 16FR TEMP STAT (SET/KITS/TRAYS/PACK) ×3 IMPLANT
TUBING LAP HI FLOW INSUFFLATIO (TUBING) ×3 IMPLANT
UNDERPAD 30X36 HEAVY ABSORB (UNDERPADS AND DIAPERS) ×3 IMPLANT
WATER STERILE IRR 1000ML POUR (IV SOLUTION) ×6 IMPLANT

## 2023-06-08 NOTE — Progress Notes (Signed)
 Dr. Joann Mu made aware that the patient last took Nitroglycerin  on yesterday afternoon- 06/07/23.

## 2023-06-08 NOTE — Interval H&P Note (Signed)
 History and Physical Interval Note:  06/08/2023 6:46 AM  Tim Walters  has presented today for surgery, with the diagnosis of CAD.  The various methods of treatment have been discussed with the patient and family. After consideration of risks, benefits and other options for treatment, the patient has consented to  Procedure(s) with comments: CORONARY ARTERY BYPASS GRAFTING (CABG) (N/A) ECHOCARDIOGRAM, TRANSESOPHAGEAL, INTRAOPERATIVE (N/A) SURGICAL PROCUREMENT, ARTERY, RADIAL (Left) - POSSIBLE LEFT RADIAL as a surgical intervention.  [name] history has been reviewed, patient examined, no change in status, stable for surgery.  I have reviewed the patient's chart and labs.  Questions were answered to the patient's satisfaction.     Kenley Rettinger K Marabelle Cushman

## 2023-06-08 NOTE — Interval H&P Note (Signed)
 History and Physical Interval Note:  06/08/2023 6:45 AM  Tim Walters  has presented today for surgery, with the diagnosis of CAD.  The various methods of treatment have been discussed with the patient and family. After consideration of risks, benefits and other options for treatment, the patient has consented to  Procedure(s) with comments: CORONARY ARTERY BYPASS GRAFTING (CABG) (N/A) ECHOCARDIOGRAM, TRANSESOPHAGEAL, INTRAOPERATIVE (N/A) SURGICAL PROCUREMENT, ARTERY, RADIAL (Left) - POSSIBLE LEFT RADIAL as a surgical intervention.  [name] history has been reviewed, patient examined, no change in status, stable for surgery.  I have reviewed the patient's chart and labs.  Questions were answered to the patient's satisfaction.     Jeson Camacho K Emie Sommerfeld

## 2023-06-08 NOTE — Progress Notes (Signed)
 Rapid wean beginning now

## 2023-06-08 NOTE — Transfer of Care (Signed)
 Immediate Anesthesia Transfer of Care Note  Patient: Tim Walters  Procedure(s) Performed: CORONARY ARTERY BYPASS GRAFTING X FOUR, USING LEFT INTERNAL MAMMARY ARTERY AND ENDOSCOPICALLY HARVESTED RIGHT GREATER SAPHENOUS VEIN GRAFT (Chest) ECHOCARDIOGRAM, TRANSESOPHAGEAL, INTRAOPERATIVE  Patient Location: SICU  Anesthesia Type:General  Level of Consciousness: sedated and Patient remains intubated per anesthesia plan  Airway & Oxygen Therapy: Patient remains intubated per anesthesia plan and Patient placed on Ventilator (see vital sign flow sheet for setting)  Post-op Assessment: Report given to RN and Post -op Vital signs reviewed and stable  Post vital signs: Reviewed and stable  Last Vitals:  Vitals Value Taken Time  BP    Temp 36.4 C 06/08/23 1350  Pulse 80 06/08/23 1350  Resp 14 06/08/23 1350  SpO2 96 % 06/08/23 1350  Vitals shown include unfiled device data.  Last Pain:  Vitals:   06/08/23 0550  TempSrc:   PainSc: 0-No pain      Patients Stated Pain Goal: 0 (06/08/23 0550)  Complications: No notable events documented.

## 2023-06-08 NOTE — Anesthesia Procedure Notes (Addendum)
 Central Venous Catheter Insertion Performed by: Gorman Laughter, MD, anesthesiologist Start/End4/23/2025 6:54 AM, 06/08/2023 7:14 AM Patient location: Pre-op. Preanesthetic checklist: patient identified, IV checked, site marked, risks and benefits discussed, surgical consent, monitors and equipment checked, pre-op evaluation, timeout performed and anesthesia consent Position: Trendelenburg Lidocaine  1% used for infiltration and patient sedated Hand hygiene performed  and maximum sterile barriers used  Catheter size: 9 Fr Sheath introducer Procedure performed using ultrasound guided technique. Ultrasound Notes:anatomy identified, needle tip was noted to be adjacent to the nerve/plexus identified, no ultrasound evidence of intravascular and/or intraneural injection and image(s) printed for medical record Attempts: 1 Following insertion, line sutured, dressing applied and Biopatch. Post procedure assessment: blood return through all ports, free fluid flow and no air  Patient tolerated the procedure well with no immediate complications.

## 2023-06-08 NOTE — Progress Notes (Signed)
      [address]       [place] [address]             [phone]      S/p CABG x 4  Extubated  BP 105/70   Pulse 99   Temp 99.3 F (37.4 C)   Resp 20   Ht 5' 7.5" (1.715 m)   Wt 95.4 kg   SpO2 97%   BMI 32.47 kg/m  4L Baxter 97% sat 43/24 CI 2.9  Intake/Output Summary (Last 24 hours) at 06/08/2023 1835 Last data filed at 06/08/2023 1700 Gross per 24 hour  Intake 4384.83 ml  Output 1960 ml  Net 2424.83 ml   Hct 33, K 4.5  Doing well early postop  Landon Pinion C. Luna Salinas, MD [facility] [phone]

## 2023-06-08 NOTE — Discharge Instructions (Signed)
 Discharge Instructions:  1. You may shower, please wash incisions daily with soap and water and keep dry.  If you wish to cover wounds with dressing you may do so but please keep clean and change daily.  No tub baths or swimming until incisions have completely healed.  If your incisions become red or develop any drainage please call our office at [phone]  2. No Driving until cleared by Dr. Sharee Pimple office and you are no longer using narcotic pain medications  3. Monitor your weight daily.. Please use the same scale and weigh at same time... If you gain 5-10 lbs in 48 hours with associated lower extremity swelling, please contact our office at [phone]  4. Fever of 101.5 for at least 24 hours with no source, please contact our office at [phone]  5. Activity- up as tolerated, please walk at least 3 times per day.  Avoid strenuous activity, no lifting, pushing, or pulling with your arms over 8-10 lbs for a minimum of 6 weeks  6. If any questions or concerns arise, please do not hesitate to contact our office at [phone]

## 2023-06-08 NOTE — Op Note (Signed)
 CARDIOVASCULAR SURGERY OPERATIVE NOTE  06/08/2023  Surgeon:  Bartley Lightning, MD  First Assistant: Debroah Fanning,  PA-C:   An experienced assistant was required given the complexity of this surgery and the standard of surgical care. The assistant was needed for endoscopic vein harvest, exposure, dissection, suctioning, retraction of delicate tissues and sutures, instrument exchange and for overall help during this procedure.   Preoperative Diagnosis:  Severe left main and multi-vessel coronary artery disease   Postoperative Diagnosis:  Same   Procedure:  Median Sternotomy Extracorporeal circulation 3.   Coronary artery bypass grafting x 4  Left internal mammary artery graft to the LAD SVG to diagonal SVG to OM SVG to RCA 4.   Endoscopic vein harvest from the right leg   Anesthesia:  General Endotracheal   Clinical History/Surgical Indication:  This 78 year old gentleman has a high-grade distal left main bifurcation stenosis resulting in recurrent episodes of angina with rest and light exertion.  I agree that coronary bypass graft surgery is the best treatment to prevent further ischemia and infarction and improve his quality of life. Vein mapping shows the saphenous vein to be present in the right leg but not in the left. His left hand is not radial artery dependent so I think we can use the radial if necessary.  I think his surgery should be done as soon as possible given the degree of stenosis. I discussed the operative procedure with the patient and his wife including alternatives, benefits and risks; including but not limited to bleeding, blood transfusion, infection, stroke, myocardial infarction, graft failure, heart block requiring a permanent pacemaker, organ dysfunction, and death.  Tim Walters understands and agrees to proceed.      Preparation:  The patient was seen in  the preoperative holding area and the correct patient, correct operation were confirmed with the patient after reviewing the medical record and catheterization. The consent was signed by me. Preoperative antibiotics were given. A pulmonary arterial line and left radial arterial line were placed by the anesthesia team. The patient was taken back to the operating room and positioned supine on the operating room table. After being placed under general endotracheal anesthesia by the anesthesia team a foley catheter was placed. The neck, chest, abdomen, and both legs were prepped with betadine soap and solution and draped in the usual sterile manner. A surgical time-out was taken and the correct patient and operative procedure were confirmed with the nursing and anesthesia staff.   Cardiopulmonary Bypass:  A median sternotomy was performed. The pericardium was opened in the midline. Right ventricular function appeared normal. The ascending aorta was of normal size and had no palpable plaque. There were no contraindications to aortic cannulation or cross-clamping. The patient was fully systemically heparinized and the ACT was maintained > 400 sec. The proximal aortic arch was cannulated with a 20 F aortic cannula for arterial inflow. Venous cannulation was performed via the right atrial appendage using a two-staged venous cannula. An antegrade cardioplegia/vent cannula was inserted into the mid-ascending aorta. Aortic occlusion was performed with a single cross-clamp. Systemic cooling to 32 degrees Centigrade and topical cooling of the heart with iced saline were used. Hyperkalemic antegrade cold blood cardioplegia was used to induce diastolic arrest and was then given at about 20 minute intervals throughout the period of arrest to maintain myocardial temperature at or below 10 degrees centigrade. A temperature probe was inserted into the interventricular septum and an insulating pad was placed in the  pericardium.  Left internal mammary artery harvest:  The left side of the sternum was retracted using the Rultract retractor. The left internal mammary artery was harvested as a pedicle graft. All side branches were clipped. It was a medium-sized vessel of good quality with excellent blood flow. It was ligated distally and divided. It was sprayed with topical papaverine solution to prevent vasospasm.   Endoscopic vein harvest: Performed by Debroah Fanning, PA-C  The right greater saphenous vein was harvested endoscopically through a 2 cm incision medial to the right knee. It was harvested from the upper thigh to below the knee. It was a medium-sized vein of good quality. The side branches were all ligated with 4-0 silk ties.    Coronary arteries:  The coronary arteries were examined.  LAD:  large vessel with no distal disease. Diagonal was heavily diseased proximally with calcified plaque. The sub-branches had no disease and were graftable but small. LCX:  Large OM with no distal distal disease RCA:  distal vessel graftable just before the PDA branch.   Grafts: Assisted by Debroah Fanning, PA-C  LIMA to the LAD: 2.5 mm. It was sewn end to side using 8-0 prolene continuous suture. SVG to diagonal:  1.5 mm. It was sewn end to side using 7-0 prolene continuous suture. SVG to OM:  2.5 mm. It was sewn end to side using 7-0 prolene continuous suture. SVG to RCA:  2.5 mm. It was sewn end to side using 7-0 prolene continuous suture.  The proximal vein graft anastomoses were performed to the mid-ascending aorta using continuous 6-0 prolene suture. Graft markers were placed around the proximal anastomoses.   Completion:  The patient was rewarmed to 37 degrees Centigrade. The clamp was removed from the LIMA pedicle and there was rapid warming of the septum and return of ventricular fibrillation. The crossclamp was removed with a time of 86 minutes. There was spontaneous return of sinus rhythm.  The distal and proximal anastomoses were checked for hemostasis. The position of the grafts was satisfactory. Two temporary epicardial pacing wires were placed on the right atrium and two on the right ventricle. The patient was weaned from CPB without difficulty on no inotropes. CPB time was 105 minutes. Cardiac output was 4 LPM. TEE showed normal LV systolic function. Heparin  was fully reversed with protamine  and the aortic and venous cannulas removed. Hemostasis was achieved. Mediastinal and left pleural drainage tubes were placed. The sternum was closed with double #6 stainless steel wires. The fascia was closed with continuous # 1 vicryl suture. The subcutaneous tissue was closed with 2-0 vicryl continuous suture. The skin was closed with 3-0 vicryl subcuticular suture. All sponge, needle, and instrument counts were reported correct at the end of the case. Dry sterile dressings were placed over the incisions and around the chest tubes which were connected to pleurevac suction. The patient was then transported to the surgical intensive care unit in stable condition.

## 2023-06-08 NOTE — Hospital Course (Addendum)
 HPI: The patient is a 78 year old gentleman with a history of hypertension, hyperlipidemia, stage II chronic kidney disease, prediabetes, OSA, and coronary artery disease status post RCA stenting in the past who was referred for consideration of CABG.  He was followed by Dr. Kay Parson for a number years and is now followed by Dr. Lorie Rook.  He reports having a 63-month history of intermittent exertional chest tightness that has progressed to the point where he is having episodes at rest that respond to nitroglycerin .  He has also had decreased stamina and exertional shortness of breath.  Cardiac catheterization on 05/24/2023 showed a 90% distal left main stenosis with haziness to the bifurcation.  There was 70% proximal to mid LAD stenosis.  The first diagonal branch had about 50% stenosis.  There is a large left circumflex without disease.  The RCA was diffusely diseased throughout its proximal and midportion to 70%.  Left ventricular ejection fraction was normal with normal LVEDP.  2D echocardiogram on 05/24/2023 showed a hyperdynamic left ventricle with ejection fraction greater than 75%.  There is grade 1 diastolic dysfunction.  There is no significant valvular abnormality.   He is retired and here today with his wife.  He has tried to remain active but has been very limited by substernal chest discomfort.  He reports having vein injections in both lower legs for varicosities in the past.  Dr. Sherene Dilling reviewed the patient's diagnostic studies and determined he would benefit from surgical intervention. He reviewed the patient's treatment options as well as the risks and benefits of surgery. Mr. Andreski was agreeable to proceed with surgery.  Hospital Course: Mr. Vea presented to [facility] and was brought to the operating room on 06/08/23. He underwent CABG x 4 utilizing LIMA to LAD, SVG to OM, SVG to PDA, and SVG to Diagonal. He tolerated the procedure well and was transferred to the SICU in  stable condition.  He was extubated the evening of surgery.  He was maintaining NSR and his Lopressor  dose was increased to 25 mg BID.  He was started on lasix  to facilitate diuresis.  His pacing wires, chest tubes, and arterial lines were removed without difficulty.  He developed an AKI likely due to pump run and diuretic usage. Diuretics were held and creatinine improved with time. He was felt stable for transfer to the progressive unit.  He developed sinus tachycardia with PVCs and ventricular bigeminy especially with ambulation, Lopressor  was titrated. PT/OT consults recommended home health PT. He was hypertensive, low dose Ramipril  was restarted. He was ambulating well on RA and bowels were moving appropriately. Incisions were healing well without sign of infection. He was felt stable for discharge home.

## 2023-06-08 NOTE — Anesthesia Procedure Notes (Signed)
 Procedure Name: Intubation Date/Time: 06/08/2023 7:52 AM  Performed by: Viki Graver, CRNAPre-anesthesia Checklist: Patient identified, Emergency Drugs available, Suction available and Patient being monitored Patient Re-evaluated:Patient Re-evaluated prior to induction Oxygen Delivery Method: Circle System Utilized Preoxygenation: Pre-oxygenation with 100% oxygen Induction Type: IV induction Ventilation: Mask ventilation without difficulty Laryngoscope Size: Miller and 2 Grade View: Grade III Tube type: Oral Tube size: 8.0 mm Number of attempts: 1 Airway Equipment and Method: Stylet, Oral airway and Bougie stylet Placement Confirmation: ETT inserted through vocal cords under direct vision, positive ETCO2 and breath sounds checked- equal and bilateral Secured at: 24 cm Tube secured with: Tape Dental Injury: Teeth and Oropharynx as per pre-operative assessment

## 2023-06-08 NOTE — Anesthesia Procedure Notes (Signed)
 Arterial Line Insertion Start/End4/23/2025 6:40 AM, 06/08/2023 6:50 AM Performed by: Gorman Laughter, MD, Grier Leber, CRNA, CRNA  Patient location: Pre-op. Preanesthetic checklist: patient identified, IV checked, site marked, risks and benefits discussed, surgical consent, monitors and equipment checked, pre-op evaluation, timeout performed and anesthesia consent Lidocaine  1% used for infiltration Left, radial was placed Catheter size: 20 G Hand hygiene performed  and maximum sterile barriers used   Attempts: 2 Procedure performed without using ultrasound guided technique. Following insertion, dressing applied and Biopatch. Post procedure assessment: normal and unchanged  Patient tolerated the procedure well with no immediate complications.

## 2023-06-08 NOTE — Discharge Summary (Signed)
 [address]       [place] [address]             [phone]    Physician Discharge Summary  Patient ID: Tim Walters MRN: [id number] DOB/AGE: Jun 14, 1945 78 y.o.  Admit date: 06/08/2023 Discharge date: 06/15/2023  Admission Diagnoses:  Patient Active Problem List   Diagnosis Date Noted   Basal cell carcinoma 03/23/2022   Major depressive disorder in full remission (HCC) 05/11/2019   Infection of finger 06/24/2018   Encounter for orthopedic follow-up care 06/23/2018   Pain of left hand 05/09/2018   Paresthesia 03/30/2016   Venous insufficiency of left leg 06/13/2015   Observation after surgery 03/24/2015   Hyperlipidemia 06/06/2013   Obstructive sleep apnea 06/06/2013   Left inguinal hernia 01/15/2013   Essential hypertension, benign 01/15/2013   Coronary atherosclerosis of native coronary artery 01/15/2013    Discharge Diagnoses:  Patient Active Problem List   Diagnosis Date Noted   S/P CABG x 4 06/08/2023   Basal cell carcinoma 03/23/2022   Major depressive disorder in full remission (HCC) 05/11/2019   Infection of finger 06/24/2018   Encounter for orthopedic follow-up care 06/23/2018   Pain of left hand 05/09/2018   Paresthesia 03/30/2016   Venous insufficiency of left leg 06/13/2015   Observation after surgery 03/24/2015   Hyperlipidemia 06/06/2013   Obstructive sleep apnea 06/06/2013   Left inguinal hernia 01/15/2013   Essential hypertension, benign 01/15/2013   Coronary atherosclerosis of native coronary artery 01/15/2013    Discharged Condition: stable  HPI: The patient is a 78 year old gentleman with a history of hypertension, hyperlipidemia, stage II chronic kidney disease, prediabetes, OSA, and coronary artery disease status post RCA stenting in the past who was referred for consideration of CABG.  He was followed by Dr. Kay Parson for a number years and is now followed by Dr. Lorie Rook.  He reports having a 14-month history  of intermittent exertional chest tightness that has progressed to the point where he is having episodes at rest that respond to nitroglycerin .  He has also had decreased stamina and exertional shortness of breath.  Cardiac catheterization on 05/24/2023 showed a 90% distal left main stenosis with haziness to the bifurcation.  There was 70% proximal to mid LAD stenosis.  The first diagonal branch had about 50% stenosis.  There is a large left circumflex without disease.  The RCA was diffusely diseased throughout its proximal and midportion to 70%.  Left ventricular ejection fraction was normal with normal LVEDP.  2D echocardiogram on 05/24/2023 showed a hyperdynamic left ventricle with ejection fraction greater than 75%.  There is grade 1 diastolic dysfunction.  There is no significant valvular abnormality.   He is retired and here today with his wife.  He has tried to remain active but has been very limited by substernal chest discomfort.  He reports having vein injections in both lower legs for varicosities in the past.  Dr. Sherene Dilling reviewed the patient's diagnostic studies and determined he would benefit from surgical intervention. He reviewed the patient's treatment options as well as the risks and benefits of surgery. Tim Walters was agreeable to proceed with surgery.  Hospital Course: Tim Walters presented to [facility] and was brought to the operating room on 06/08/23. He underwent CABG x 4 utilizing LIMA to LAD, SVG to OM, SVG to PDA, and SVG to Diagonal. He tolerated the procedure well and was transferred to the SICU in stable condition.  He  was extubated the evening of surgery.  He was maintaining NSR and his Lopressor  dose was increased to 25 mg BID.  He was started on lasix  to facilitate diuresis.  His pacing wires, chest tubes, and arterial lines were removed without difficulty.  He developed an AKI likely due to pump run and diuretic usage. Diuretics were held and AKI resolved with time. He was  felt stable for transfer to the progressive unit.  He developed sinus tachycardia with PVCs and ventricular bigeminy, Lopressor  was titrated. PT/OT consults recommended home health PT which was ordered accordingly. He was hypertensive, low dose Ramipril  was restarted. He remained edematous and above his preoperative weight, he was discharged home on 1 week of Demadex and potassium supplement as discussed with Dr. Sherene Dilling. He was ambulating well on RA and bowels were moving appropriately. Incisions were healing well without sign of infection. He was felt stable for discharge home.   Consults: None  Significant Diagnostic Studies:  LEFT HEART CATH AND CORONARY ANGIOGRAPHY     Prox LAD to Mid LAD lesion is 70% stenosed.   Mid LM to Dist LM lesion is 90% stenosed.   1st Diag lesion is 50% stenosed.   Prox RCA to Mid RCA lesion is 70% stenosed.   The left ventricular systolic function is normal.   LV end diastolic pressure is normal.   The left ventricular ejection fraction is 55-65% by visual estimate.   Left main and severe 2 vessel CAD Normal LV function Normal LVEDP    ECHOCARDIOGRAM REPORT       Patient Name:   Tim Walters Date of Exam: 05/24/2023  Medical Rec #:  [id number]          Height:       67.0 in  Accession #:    [id number]         Weight:       212.0 lb  Date of Birth:  1945-12-19         BSA:          2.073 m  Patient Age:    27 years           BP:           119/65 mmHg  Patient Gender: M                  HR:           64 bpm.  Exam Location:  Inpatient   Procedure: 2D Echo, Cardiac Doppler, Color Doppler and Intracardiac             Opacification Agent (Both Spectral and Color Flow Doppler were             utilized during procedure).   Indications:    CAD    History:        Patient has prior history of Echocardiogram examinations,  most                 recent 07/28/2022. Risk Factors:Dyslipidemia, Hypertension  and                 Sleep Apnea.    Sonographer:     Meagan Baucom RDCS, FE, PE  Referring Phys: [id number] PETER M [place]   IMPRESSIONS     1. Left ventricular ejection fraction, by estimation, is >75%. The left  ventricle has hyperdynamic function. The left ventricle has no regional  wall motion abnormalities. Left ventricular diastolic parameters are  consistent with  Grade I diastolic  dysfunction (impaired relaxation).   2. Right ventricular systolic function is normal. The right ventricular  size is normal.   3. Left atrial size was mildly dilated.   4. The mitral valve is normal in structure. No evidence of mitral valve  regurgitation. No evidence of mitral stenosis.   5. The aortic valve is tricuspid. There is mild thickening of the aortic  valve. Aortic valve regurgitation is not visualized. Aortic valve  sclerosis is present, with no evidence of aortic valve stenosis.   6. The inferior vena cava is normal in size with greater than 50%  respiratory variability, suggesting right atrial pressure of 3 mmHg.   Comparison(s): No significant change from prior study. Prior images  reviewed side by side.   FINDINGS   Left Ventricle: Left ventricular ejection fraction, by estimation, is  >75%. The left ventricle has hyperdynamic function. The left ventricle has  no regional wall motion abnormalities. Strain was performed and the global  longitudinal strain is  indeterminate. The left ventricular internal cavity size was normal in  size. There is no left ventricular hypertrophy. Left ventricular diastolic  parameters are consistent with Grade I diastolic dysfunction (impaired  relaxation).   Right Ventricle: The right ventricular size is normal. No increase in  right ventricular wall thickness. Right ventricular systolic function is  normal.   Left Atrium: Left atrial size was mildly dilated.   Right Atrium: Right atrial size was normal in size.   Pericardium: There is no evidence of pericardial effusion.   Mitral Valve: The  mitral valve is normal in structure. No evidence of  mitral valve regurgitation. No evidence of mitral valve stenosis.   Tricuspid Valve: The tricuspid valve is normal in structure. Tricuspid  valve regurgitation is not demonstrated. No evidence of tricuspid  stenosis.   Aortic Valve: The aortic valve is tricuspid. There is mild thickening of  the aortic valve. There is mild aortic valve annular calcification. Aortic  valve regurgitation is not visualized. Aortic valve sclerosis is present,  with no evidence of aortic valve   stenosis. Aortic valve mean gradient measures 6.0 mmHg. Aortic valve peak  gradient measures 10.8 mmHg. Aortic valve area, by VTI measures 4.75 cm.   Pulmonic Valve: The pulmonic valve was not well visualized. Pulmonic valve  regurgitation is not visualized. No evidence of pulmonic stenosis.   Aorta: The aortic root and ascending aorta are structurally normal, with  no evidence of dilitation.   Venous: The inferior vena cava is normal in size with greater than 50%  respiratory variability, suggesting right atrial pressure of 3 mmHg.   IAS/Shunts: The atrial septum is grossly normal.   Additional Comments: 3D was performed not requiring image post processing  on an independent workstation and was indeterminate.     LEFT VENTRICLE  PLAX 2D  LVIDd:         4.20 cm   Diastology  LVIDs:         2.30 cm   LV e' medial:    5.91 cm/s  LV PW:         1.10 cm   LV E/e' medial:  13.0  LV IVS:        0.90 cm   LV e' lateral:   11.80 cm/s  LVOT diam:     2.70 cm   LV E/e' lateral: 6.5  LV SV:         165  LV SV Index:   80  LVOT  Area:     5.73 cm     RIGHT VENTRICLE  RV Basal diam:  3.70 cm  RV Mid diam:    3.10 cm  RV S prime:     12.90 cm/s  TAPSE (M-mode): 1.6 cm   LEFT ATRIUM             Index        RIGHT ATRIUM           Index  LA Vol (A2C):   49.9 ml 24.07 ml/m  RA Area:     14.00 cm  LA Vol (A4C):   82.4 ml 39.75 ml/m  RA Volume:   27.80 ml   13.41 ml/m  LA Biplane Vol: 70.9 ml 34.20 ml/m   AORTIC VALVE  AV Area (Vmax):    4.54 cm  AV Area (Vmean):   4.42 cm  AV Area (VTI):     4.75 cm  AV Vmax:           164.00 cm/s  AV Vmean:          111.000 cm/s  AV VTI:            0.347 m  AV Peak Grad:      10.8 mmHg  AV Mean Grad:      6.0 mmHg  LVOT Vmax:         130.00 cm/s  LVOT Vmean:        85.700 cm/s  LVOT VTI:          0.288 m  LVOT/AV VTI ratio: 0.83    AORTA  Ao Root diam: 3.80 cm  Ao Asc diam:  3.40 cm   MITRAL VALVE                TRICUSPID VALVE  MV Area (PHT): 2.56 cm     TR Peak grad:   5.8 mmHg  MV Decel Time: 296 msec     TR Vmax:        120.00 cm/s  MV E velocity: 77.10 cm/s  MV A velocity: 103.00 cm/s  SHUNTS  MV E/A ratio:  0.75         Systemic VTI:  0.29 m                              Systemic Diam: 2.70 cm   Gloriann Larger MD  Electronically signed by Gloriann Larger MD  Signature Date/Time: 05/24/2023/2:01:46 PM        Final     Treatments: surgery:  CARDIOVASCULAR SURGERY OPERATIVE NOTE   06/08/2023   Surgeon:  Bartley Lightning, MD   First Assistant: Debroah Fanning,  PA-C:   An experienced assistant was required given the complexity of this surgery and the standard of surgical care. The assistant was needed for endoscopic vein harvest, exposure, dissection, suctioning, retraction of delicate tissues and sutures, instrument exchange and for overall help during this procedure.     Preoperative Diagnosis:  Severe left main and multi-vessel coronary artery disease     Postoperative Diagnosis:  Same     Procedure:   Median Sternotomy Extracorporeal circulation 3.   Coronary artery bypass grafting x 4   Left internal mammary artery graft to the LAD SVG to diagonal SVG to OM SVG to RCA 4.   Endoscopic vein harvest from the right leg  Discharge Exam: Blood pressure 117/63, pulse (!) 148, temperature 97.9 F (36.6 C), temperature  source Oral, resp. rate (!) 23, height 5'  7.5" (1.715 m), weight 97.7 kg, SpO2 95%. General appearance: alert, cooperative, and no distress Neurologic: intact Heart: regular rate and rhythm with PVCs, no murmur Lungs: clear to auscultation bilaterally Abdomen: soft, non-tender; bowel sounds normal; no masses,  no organomegaly Extremities: edema 1+ BLE Wound: Clean and dry without sign of infection, slight erythema at superior portion of sternal incision, no drainage or sign of infection   Discharge Medications:  The patient has been discharged on:   1.Beta Blocker:  Yes [  X ]                              No   [   ]                              If No, reason:  2.Ace Inhibitor/ARB: Yes [ X  ]                                     No  [    ]                                     If No, reason:  3.Statin:   Yes [  X ]                  No  [   ]                  If No, reason:  4.Ecasa:  Yes  [  X ]                  No   [   ]                  If No, reason:  Patient had ACS upon admission: No  Plavix/P2Y12 inhibitor: Yes [   ]                                      No  [  X ]     Discharge Instructions     Amb Referral to Cardiac Rehabilitation   Complete by: As directed    Diagnosis: CABG   CABG X ___: 4   After initial evaluation and assessments completed: Virtual Based Care may be provided alone or in conjunction with Phase 2 Cardiac Rehab based on patient barriers.: Yes   Intensive Cardiac Rehabilitation (ICR) [facility] location only OR Traditional Cardiac Rehabilitation (TCR) *If criteria for ICR are not met will enroll in TCR [facility] only): Yes      Allergies as of 06/15/2023   No Known Allergies      Medication List     STOP taking these medications    aspirin  81 MG tablet Replaced by: aspirin  EC 325 MG tablet   hydrochlorothiazide  12.5 MG capsule Commonly known as: MICROZIDE    isosorbide  mononitrate 30 MG 24 hr tablet Commonly known as: IMDUR    nitroGLYCERIN  0.4 MG SL tablet Commonly known as:  NITROSTAT        TAKE these medications    acetaminophen  325 MG  tablet Commonly known as: Tylenol  Take 2 tablets (650 mg total) by mouth every 6 (six) hours as needed.   aspirin  EC 325 MG tablet Take 1 tablet (325 mg total) by mouth daily. Replaces: aspirin  81 MG tablet   atorvastatin  80 MG tablet Commonly known as: LIPITOR TAKE 1 TABLET BY MOUTH ONCE  DAILY   ezetimibe  10 MG tablet Commonly known as: ZETIA  TAKE 1 TABLET BY MOUTH DAILY   metoprolol  tartrate 50 MG tablet Commonly known as: LOPRESSOR  Take 1.5 tablets (75 mg total) by mouth 2 (two) times daily. What changed:  medication strength how much to take   multivitamin with minerals Tabs tablet Take 1 tablet by mouth daily.   omeprazole 20 MG capsule Commonly known as: PRILOSEC Take 20 mg by mouth daily.   potassium chloride  SA 20 MEQ tablet Commonly known as: KLOR-CON  M Take 1 tablet (20 mEq total) by mouth daily. For 7 days or until you stop taking Demadex   ramipril  5 MG capsule Commonly known as: ALTACE  Take 1 capsule (5 mg total) by mouth daily. What changed:  medication strength how much to take   sertraline  50 MG tablet Commonly known as: ZOLOFT  TAKE 1 TABLET BY MOUTH DAILY   torsemide 20 MG tablet Commonly known as: DEMADEX Take 1 tablet (20mg ) daily for 7 days OR until you reach your preoperative weight of 210lbs and swelling has resolved   traMADol  50 MG tablet Commonly known as: ULTRAM  Take 1 tablet (50 mg total) by mouth every 6 (six) hours as needed for moderate pain (pain score 4-6) or severe pain (pain score 7-10).               Durable Medical Equipment  (From admission, onward)           Start     Ordered   06/14/23 0912  For home use only DME Bedside commode  Once       Question:  Patient needs a bedside commode to treat with the following condition  Answer:  Imbalance   06/14/23 0911   06/14/23 0722  For home use only DME Walker rolling  Once       Question Answer  Comment  Walker: With 5 Inch Wheels   Patient needs a walker to treat with the following condition S/P CABG (coronary artery bypass graft)   Patient needs a walker to treat with the following condition Physical deconditioning      06/14/23 0721            Follow-up Information     [facility] Follow up on 06/20/2023.   Specialty: Cardiothoracic Surgery Why: Nurse visit for suture removal is at 11:30 Contact information: [address] [place]  [address] [phone]        [facility] Follow up on 07/13/2023.   Specialty: Cardiothoracic Surgery Why: Appointment with Dr. Sherene Dilling is at 10:30. Please get a chest xray on the 2nd floor at 9:30, 1 hour prior to your appointment Contact information: [address], [address] [place]  [address] [phone]        Leala Prince, PA-C Follow up on 07/01/2023.   Specialty: Cardiology Why: Appointment is at 10:55 Contact information: [address], [address] [place] [address] [phone]  [facility] Follow up.   Why: Lennart Quitter) HHPT arranged- they will contact you to schedule Contact information: [address] [place] [address] [phone]         [facility] Follow up.   Why: rolling walker arranged- they will deliver to the home once [employer] DME partner) approves. Contact information: [address] [place] [address] [phone]                 Signed:  Randa Burton, PA-C  06/15/2023, 3:10 PM

## 2023-06-08 NOTE — Anesthesia Procedure Notes (Signed)
 Arterial Line Insertion Start/End4/23/2025 6:45 AM, 06/08/2023 6:54 AM Performed by: Gorman Laughter, MD, Grier Leber, CRNA, CRNA  Patient location: Pre-op. Preanesthetic checklist: patient identified, IV checked, site marked, risks and benefits discussed, surgical consent, monitors and equipment checked, pre-op evaluation, timeout performed and anesthesia consent Lidocaine  1% used for infiltration radial was placed Catheter size: 20 G Hand hygiene performed  and maximum sterile barriers used   Attempts: 2 Procedure performed without using ultrasound guided technique. Following insertion, dressing applied. Post procedure assessment: normal and unchanged  Patient tolerated the procedure well with no immediate complications.

## 2023-06-08 NOTE — Procedures (Signed)
 Extubation Procedure Note  Patient Details:   Name: Tim Walters DOB: December 24, 1945 MRN: [id number]   Airway Documentation:    Vent end date: (not recorded) Vent end time: (not recorded)   Evaluation  O2 sats: stable throughout Complications: No apparent complications Patient did tolerate procedure well. Bilateral Breath Sounds: Clear, Diminished   Yes   Aarianna Hoadley 06/08/2023, 4:54 PM  Pt extubated to 4L, NIF -25 VC 1L. Positive cuff leak prior to extubation.

## 2023-06-08 NOTE — Anesthesia Postprocedure Evaluation (Signed)
 Anesthesia Post Note  Patient: Tim Walters  Procedure(s) Performed: CORONARY ARTERY BYPASS GRAFTING X FOUR, USING LEFT INTERNAL MAMMARY ARTERY AND ENDOSCOPICALLY HARVESTED RIGHT GREATER SAPHENOUS VEIN GRAFT (Chest) ECHOCARDIOGRAM, TRANSESOPHAGEAL, INTRAOPERATIVE     Patient location during evaluation: SICU Anesthesia Type: General Level of consciousness: sedated Pain management: pain level not controlled Vital Signs Assessment: post-procedure vital signs reviewed and stable Respiratory status: spontaneous breathing Cardiovascular status: stable Anesthetic complications: no   No notable events documented.  Last Vitals:  Vitals:   06/08/23 1710 06/08/23 1715  BP:    Pulse: 88 87  Resp: (!) 23 (!) 27  Temp: 37.1 C 37.1 C  SpO2: 93% 92%    Last Pain:  Vitals:   06/08/23 1651  TempSrc:   PainSc: 9                  Libbi Towner

## 2023-06-08 NOTE — Anesthesia Procedure Notes (Signed)
 Central Venous Catheter Insertion Performed by: Gorman Laughter, MD, anesthesiologist Start/End4/23/2025 7:14 AM, 06/08/2023 7:21 AM Patient location: Pre-op. Preanesthetic checklist: patient identified, IV checked, site marked, risks and benefits discussed, surgical consent, monitors and equipment checked, pre-op evaluation, timeout performed and anesthesia consent Position: Trendelenburg Hand hygiene performed  and maximum sterile barriers used  PA cath was placed.Swan type:thermodilution PA Cath depth:56 Procedure performed without using ultrasound guided technique. Attempts: 3 Post procedure assessment: free fluid flow and no air  Patient tolerated the procedure well with no immediate complications.

## 2023-06-08 NOTE — Brief Op Note (Signed)
 06/08/2023  10:11 AM  PATIENT:  Tim Walters  78 y.o. male  PRE-OPERATIVE DIAGNOSIS:  CORONARY ARTERY DISEASE  POST-OPERATIVE DIAGNOSIS:  CORONARY ARTERY DISEASE  PROCEDURE:  CORONARY ARTERY BYPASS GRAFTING X , USING LEFT INTERNAL MAMMARY ARTERY AND ENDOSCOPICALLY HARVESTED RIGHT SAPHENOUS VEIN GRAFT (N/A) ECHOCARDIOGRAM, TRANSESOPHAGEAL, INTRAOPERATIVE (N/A) Vein harvest time: Vein prep time: -LIMA to LAD -SVG to OM -SVG to PDA -SVG to Diagonal  SURGEON:  Surgeons and Role:    * Bartley Lightning, MD - Primary  PHYSICIAN ASSISTANT: Debroah Fanning PA-C  ASSISTANTS: Nova Began RNFA   ANESTHESIA:   general  EBL:  Per perfusion records   BLOOD ADMINISTERED: Per perfusion records  DRAINS:  Mediastinal and pleural tubes    LOCAL MEDICATIONS USED:  NONE  SPECIMEN:  No Specimen  DISPOSITION OF SPECIMEN:  N/A  COUNTS:  YES  DICTATION: .Dragon Dictation  PLAN OF CARE: Admit to inpatient   PATIENT DISPOSITION:  ICU - intubated and hemodynamically stable.   Delay start of Pharmacological VTE agent (>24hrs) due to surgical blood loss or risk of bleeding: yes

## 2023-06-09 ENCOUNTER — Inpatient Hospital Stay (HOSPITAL_COMMUNITY)

## 2023-06-09 ENCOUNTER — Encounter (HOSPITAL_COMMUNITY): Payer: Self-pay | Admitting: Surgery

## 2023-06-09 LAB — BASIC METABOLIC PANEL WITH GFR
Anion gap: 8 (ref 5–15)
Anion gap: 9 (ref 5–15)
BUN: 21 mg/dL (ref 8–23)
BUN: 26 mg/dL — ABNORMAL HIGH (ref 8–23)
CO2: 21 mmol/L — ABNORMAL LOW (ref 22–32)
CO2: 21 mmol/L — ABNORMAL LOW (ref 22–32)
Calcium: 7.7 mg/dL — ABNORMAL LOW (ref 8.9–10.3)
Calcium: 7.8 mg/dL — ABNORMAL LOW (ref 8.9–10.3)
Chloride: 104 mmol/L (ref 98–111)
Chloride: 102 mmol/L (ref 98–111)
Creatinine, Ser: 1.1 mg/dL (ref 0.61–1.24)
Creatinine, Ser: 1.74 mg/dL — ABNORMAL HIGH (ref 0.61–1.24)
GFR, Estimated: >60 mL/min (ref >60)
GFR, Estimated: 39 mL/min — ABNORMAL LOW (ref >60)
Glucose, Bld: 125 mg/dL — ABNORMAL HIGH (ref 70–99)
Glucose, Bld: 158 mg/dL — ABNORMAL HIGH (ref 70–99)
Potassium: 4.4 mmol/L (ref 3.5–5.1)
Potassium: 4.6 mmol/L (ref 3.5–5.1)
Sodium: 133 mmol/L — ABNORMAL LOW (ref 135–145)
Sodium: 132 mmol/L — ABNORMAL LOW (ref 135–145)

## 2023-06-09 LAB — CBC
HCT: 34.5 % — ABNORMAL LOW (ref 39.0–52.0)
HCT: 35.1 % — ABNORMAL LOW (ref 39.0–52.0)
Hemoglobin: 11.4 g/dL — ABNORMAL LOW (ref 13.0–17.0)
Hemoglobin: 11.1 g/dL — ABNORMAL LOW (ref 13.0–17.0)
MCH: 31.8 pg (ref 26.0–34.0)
MCH: 31 pg (ref 26.0–34.0)
MCHC: 33 g/dL (ref 30.0–36.0)
MCHC: 31.6 g/dL (ref 30.0–36.0)
MCV: 96.1 fL (ref 80.0–100.0)
MCV: 98 fL (ref 80.0–100.0)
Platelets: 121 10*3/uL — ABNORMAL LOW (ref 150–400)
Platelets: 124 10*3/uL — ABNORMAL LOW (ref 150–400)
RBC: 3.59 MIL/uL — ABNORMAL LOW (ref 4.22–5.81)
RBC: 3.58 MIL/uL — ABNORMAL LOW (ref 4.22–5.81)
RDW: 13.6 % (ref 11.5–15.5)
RDW: 13.8 % (ref 11.5–15.5)
WBC: 9.3 10*3/uL (ref 4.0–10.5)
WBC: 11.9 10*3/uL — ABNORMAL HIGH (ref 4.0–10.5)
nRBC: 0 % (ref 0.0–0.2)
nRBC: 0 % (ref 0.0–0.2)

## 2023-06-09 LAB — GLUCOSE, CAPILLARY
Glucose-Capillary: 102 mg/dL — ABNORMAL HIGH (ref 70–99)
Glucose-Capillary: 114 mg/dL — ABNORMAL HIGH (ref 70–99)
Glucose-Capillary: 115 mg/dL — ABNORMAL HIGH (ref 70–99)
Glucose-Capillary: 117 mg/dL — ABNORMAL HIGH (ref 70–99)
Glucose-Capillary: 127 mg/dL — ABNORMAL HIGH (ref 70–99)
Glucose-Capillary: 131 mg/dL — ABNORMAL HIGH (ref 70–99)
Glucose-Capillary: 146 mg/dL — ABNORMAL HIGH (ref 70–99)
Glucose-Capillary: 147 mg/dL — ABNORMAL HIGH (ref 70–99)
Glucose-Capillary: 162 mg/dL — ABNORMAL HIGH (ref 70–99)
Glucose-Capillary: 172 mg/dL — ABNORMAL HIGH (ref 70–99)

## 2023-06-09 LAB — MAGNESIUM
Magnesium: 2.6 mg/dL — ABNORMAL HIGH (ref 1.7–2.4)
Magnesium: 2.5 mg/dL — ABNORMAL HIGH (ref 1.7–2.4)

## 2023-06-09 MED ORDER — SERTRALINE HCL 50 MG PO TABS
50.0000 mg | ORAL_TABLET | Freq: Every day | ORAL | Status: DC
  Administered 2023-06-09 – 2023-06-14 (×6): 50 mg via ORAL
  Filled 2023-06-09 (×6): qty 1

## 2023-06-09 MED ORDER — METOPROLOL TARTRATE 25 MG PO TABS
25.0000 mg | ORAL_TABLET | Freq: Two times a day (BID) | ORAL | Status: DC
  Administered 2023-06-09 – 2023-06-10 (×4): 25 mg via ORAL
  Filled 2023-06-09 (×5): qty 1

## 2023-06-09 MED ORDER — HYDRALAZINE HCL 20 MG/ML IJ SOLN
10.0000 mg | INTRAMUSCULAR | Status: DC | PRN
  Administered 2023-06-09: 10 mg via INTRAVENOUS
  Filled 2023-06-09: qty 1

## 2023-06-09 MED ORDER — ENOXAPARIN SODIUM 40 MG/0.4ML IJ SOSY
40.0000 mg | PREFILLED_SYRINGE | Freq: Every day | INTRAMUSCULAR | Status: DC
  Administered 2023-06-09 – 2023-06-14 (×6): 40 mg via SUBCUTANEOUS
  Filled 2023-06-09 (×6): qty 0.4

## 2023-06-09 MED ORDER — RAMIPRIL 5 MG PO CAPS
10.0000 mg | ORAL_CAPSULE | Freq: Every day | ORAL | Status: DC
  Administered 2023-06-09: 10 mg via ORAL
  Filled 2023-06-09 (×2): qty 2

## 2023-06-09 MED ORDER — INSULIN ASPART 100 UNIT/ML IJ SOLN
0.0000 [IU] | INTRAMUSCULAR | Status: DC
  Administered 2023-06-09 (×2): 2 [IU] via SUBCUTANEOUS
  Administered 2023-06-09 (×2): 4 [IU] via SUBCUTANEOUS
  Administered 2023-06-09: 2 [IU] via SUBCUTANEOUS

## 2023-06-09 MED ORDER — POTASSIUM CHLORIDE CRYS ER 20 MEQ PO TBCR
20.0000 meq | EXTENDED_RELEASE_TABLET | Freq: Two times a day (BID) | ORAL | Status: AC
  Administered 2023-06-09 (×2): 20 meq via ORAL
  Filled 2023-06-09 (×2): qty 1

## 2023-06-09 MED ORDER — FUROSEMIDE 10 MG/ML IJ SOLN
40.0000 mg | Freq: Two times a day (BID) | INTRAMUSCULAR | Status: AC
  Administered 2023-06-09 (×2): 40 mg via INTRAVENOUS
  Filled 2023-06-09 (×2): qty 4

## 2023-06-09 MED ORDER — ATORVASTATIN CALCIUM 80 MG PO TABS
80.0000 mg | ORAL_TABLET | Freq: Every day | ORAL | Status: DC
  Administered 2023-06-09 – 2023-06-14 (×6): 80 mg via ORAL
  Filled 2023-06-09 (×7): qty 1

## 2023-06-09 MED ORDER — METOLAZONE 2.5 MG PO TABS
2.5000 mg | ORAL_TABLET | Freq: Once | ORAL | Status: AC
  Administered 2023-06-09: 2.5 mg via ORAL
  Filled 2023-06-09: qty 1

## 2023-06-09 MED FILL — Thrombin (Recombinant) For Soln 20000 Unit: CUTANEOUS | Qty: 1 | Status: AC

## 2023-06-09 NOTE — Plan of Care (Signed)
  Problem: Clinical Measurements: Goal: Respiratory complications will improve Outcome: Progressing   Problem: Activity: Goal: Risk for activity intolerance will decrease Outcome: Progressing   Problem: Nutrition: Goal: Adequate nutrition will be maintained Outcome: Progressing   Problem: Elimination: Goal: Will not experience complications related to bowel motility Outcome: Progressing   Problem: Pain Managment: Goal: General experience of comfort will improve and/or be controlled Outcome: Progressing   Problem: Activity: Goal: Risk for activity intolerance will decrease Outcome: Progressing   Problem: Clinical Measurements: Goal: Postoperative complications will be avoided or minimized Outcome: Progressing   Problem: Respiratory: Goal: Respiratory status will improve Outcome: Progressing

## 2023-06-09 NOTE — Progress Notes (Signed)
 1 Day Post-Op Procedure(s) (LRB): CORONARY ARTERY BYPASS GRAFTING X FOUR, USING LEFT INTERNAL MAMMARY ARTERY AND ENDOSCOPICALLY HARVESTED RIGHT GREATER SAPHENOUS VEIN GRAFT (N/A) ECHOCARDIOGRAM, TRANSESOPHAGEAL, INTRAOPERATIVE (N/A) Subjective: No complaints  Objective: Vital signs in last 24 hours: Temp:  [97 F (36.1 C)-99.3 F (37.4 C)] 99.1 F (37.3 C) (04/24 0700) Pulse Rate:  [73-109] 94 (04/24 0700) Cardiac Rhythm: Normal sinus rhythm;Sinus tachycardia (04/24 0400) Resp:  [14-32] 25 (04/24 0700) BP: (97-152)/(63-98) 138/80 (04/24 0700) SpO2:  [88 %-100 %] 91 % (04/24 0700) Arterial Line BP: (62-164)/(35-88) 130/59 (04/24 0700) FiO2 (%):  [40 %-50 %] 40 % (04/23 1618) Weight:  [100.4 kg] 100.4 kg (04/24 0500)  Hemodynamic parameters for last 24 hours: PAP: (25-45)/(8-24) 38/19 CO:  [3.7 L/min-7.1 L/min] 5.8 L/min CI:  [1.79 L/min/m2-3.45 L/min/m2] 2.79 L/min/m2  Intake/Output from previous day: 04/23 0701 - 04/24 0700 In: 5127.1 [I.V.:3610.3; Blood:228; IV Piggyback:1288.8] Out: 2925 [Urine:1725; Blood:410; Chest Tube:790] Intake/Output this shift: No intake/output data recorded.  General appearance: alert and cooperative Neurologic: intact Heart: regular rate and rhythm Lungs: clear to auscultation bilaterally Extremities: edema mild Wound: dressings dry  Lab Results: Recent Labs    06/08/23 1943 06/09/23 0408  WBC 8.8 9.3  HGB 10.8* 11.4*  HCT 32.8* 34.5*  PLT 105* 121*   BMET:  Recent Labs    06/08/23 1943 06/09/23 0408  NA 135 133*  K 4.4 4.4  CL 107 104  CO2 23 21*  GLUCOSE 156* 125*  BUN 20 21  CREATININE 0.99 1.10  CALCIUM  7.5* 7.7*    PT/INR:  Recent Labs    06/08/23 1352  LABPROT 16.6*  INR 1.3*   ABG    Component Value Date/Time   PHART 7.319 (L) 06/08/2023 1804   HCO3 21.0 06/08/2023 1804   TCO2 22 06/08/2023 1804   ACIDBASEDEF 5.0 (H) 06/08/2023 1804   O2SAT 94 06/08/2023 1804   CBG (last 3)  Recent Labs     06/09/23 0305 06/09/23 0409 06/09/23 0614  GLUCAP 115* 117* 127*   CXR: mild left lower lobe atelectasis  ECG: sinus, no acute changes.  Assessment/Plan: S/P Procedure(s) (LRB): CORONARY ARTERY BYPASS GRAFTING X FOUR, USING LEFT INTERNAL MAMMARY ARTERY AND ENDOSCOPICALLY HARVESTED RIGHT GREATER SAPHENOUS VEIN GRAFT (N/A) ECHOCARDIOGRAM, TRANSESOPHAGEAL, INTRAOPERATIVE (N/A)  POD 1 Hemodynamically stable in sinus rhythm. Increase Lopressor  to 25 bid.  Wt is 11 lbs over preop. Start diuresis.  DM: preop Hgb A1c 6.0 on no meds. It has been 6.3-6.5 over the past year. Transition to SSI. Will need continued attention to diet, exercise and wt loss.  DC pacing wires. Wait two hrs and remove all chest tubes if stable.   DC swan, arterial line  IS, OOB, mobilize today.   LOS: 1 day    Tim Walters 06/09/2023

## 2023-06-10 ENCOUNTER — Inpatient Hospital Stay (HOSPITAL_COMMUNITY)

## 2023-06-10 LAB — BASIC METABOLIC PANEL WITH GFR
Anion gap: 8 (ref 5–15)
BUN: 33 mg/dL — ABNORMAL HIGH (ref 8–23)
CO2: 22 mmol/L (ref 22–32)
Calcium: 8 mg/dL — ABNORMAL LOW (ref 8.9–10.3)
Chloride: 102 mmol/L (ref 98–111)
Creatinine, Ser: 1.91 mg/dL — ABNORMAL HIGH (ref 0.61–1.24)
GFR, Estimated: 35 mL/min — ABNORMAL LOW (ref >60)
Glucose, Bld: 111 mg/dL — ABNORMAL HIGH (ref 70–99)
Potassium: 4.8 mmol/L (ref 3.5–5.1)
Sodium: 132 mmol/L — ABNORMAL LOW (ref 135–145)

## 2023-06-10 LAB — GLUCOSE, CAPILLARY
Glucose-Capillary: 106 mg/dL — ABNORMAL HIGH (ref 70–99)
Glucose-Capillary: 108 mg/dL — ABNORMAL HIGH (ref 70–99)
Glucose-Capillary: 129 mg/dL — ABNORMAL HIGH (ref 70–99)
Glucose-Capillary: 138 mg/dL — ABNORMAL HIGH (ref 70–99)
Glucose-Capillary: 143 mg/dL — ABNORMAL HIGH (ref 70–99)
Glucose-Capillary: 96 mg/dL (ref 70–99)

## 2023-06-10 LAB — CBC
HCT: 32.5 % — ABNORMAL LOW (ref 39.0–52.0)
Hemoglobin: 10.5 g/dL — ABNORMAL LOW (ref 13.0–17.0)
MCH: 32.1 pg (ref 26.0–34.0)
MCHC: 32.3 g/dL (ref 30.0–36.0)
MCV: 99.4 fL (ref 80.0–100.0)
Platelets: 119 10*3/uL — ABNORMAL LOW (ref 150–400)
RBC: 3.27 MIL/uL — ABNORMAL LOW (ref 4.22–5.81)
RDW: 14 % (ref 11.5–15.5)
WBC: 11.4 10*3/uL — ABNORMAL HIGH (ref 4.0–10.5)
nRBC: 0 % (ref 0.0–0.2)

## 2023-06-10 MED ORDER — INSULIN ASPART 100 UNIT/ML IJ SOLN
0.0000 [IU] | Freq: Three times a day (TID) | INTRAMUSCULAR | Status: DC
  Administered 2023-06-10 (×2): 2 [IU] via SUBCUTANEOUS

## 2023-06-10 MED FILL — Heparin Sodium (Porcine) Inj 1000 Unit/ML: INTRAMUSCULAR | Qty: 10 | Status: AC

## 2023-06-10 MED FILL — Potassium Chloride Inj 2 mEq/ML: INTRAVENOUS | Qty: 40 | Status: AC

## 2023-06-10 MED FILL — Heparin Sodium (Porcine) Inj 1000 Unit/ML: Qty: 1000 | Status: AC

## 2023-06-10 MED FILL — Magnesium Sulfate Inj 50%: INTRAMUSCULAR | Qty: 10 | Status: AC

## 2023-06-10 MED FILL — Electrolyte-R (PH 7.4) Solution: INTRAVENOUS | Qty: 4000 | Status: AC

## 2023-06-10 MED FILL — Sodium Bicarbonate IV Soln 8.4%: INTRAVENOUS | Qty: 50 | Status: AC

## 2023-06-10 MED FILL — Mannitol IV Soln 20%: INTRAVENOUS | Qty: 500 | Status: AC

## 2023-06-10 MED FILL — Sodium Chloride IV Soln 0.9%: INTRAVENOUS | Qty: 2000 | Status: AC

## 2023-06-10 MED FILL — Lidocaine HCl Local Soln Prefilled Syringe 100 MG/5ML (2%): INTRAMUSCULAR | Qty: 5 | Status: AC

## 2023-06-10 NOTE — TOC Initial Note (Signed)
 Transition of Care [facility]) - Initial/Assessment Note    Patient Details  Name: Tim Walters MRN: [id number] Date of Birth: [birth year]  [facility] CM/SW Contact:    Cosimo Diones, RN Phone Number: 06/10/2023, 3:57 PM  Clinical Narrative: Patient is POD-2 CABG. PTA patient was from home with spouse. Patient does not use any DME. Spouse states patient was limited with movement due to how he was feeling pre- hospitalization. Patient will benefit from PT/OT consult as he progresses for disposition recommendations and DME needs. Case Manager will continue to follow for transition of care needs as the patient progresses.                Expected Discharge Plan: Home w Home Health Services Barriers to Discharge: Continued Medical Work up   Patient Goals and CMS Choice Patient states their goals for this hospitalization and ongoing recovery are:: plan to return home once stable   Expected Discharge Plan and Services In-house Referral: NA Discharge Planning Services: CM Consult Post Acute Care Choice: Home Health Living arrangements for the past 2 months: Single Family Home                   DME Agency: NA  Prior Living Arrangements/Services Living arrangements for the past 2 months: Single Family Home Lives with:: Spouse Patient language and need for interpreter reviewed:: Yes Do you feel safe going back to the place where you live?: Yes      Need for Family Participation in Patient Care: Yes (Comment) Care giver support system in place?: Yes (comment)   Criminal Activity/Legal Involvement Pertinent to Current Situation/Hospitalization: No - Comment as needed  Activities of Daily Living   ADL Screening (condition at time of admission) Independently performs ADLs?: Yes (appropriate for developmental age) Is the patient deaf or have difficulty hearing?: No Does the patient have difficulty seeing, even when wearing glasses/contacts?: No Does the  patient have difficulty concentrating, remembering, or making decisions?: No  Permission Sought/Granted Permission sought to share information with : Family Supports, Case Manager   Emotional Assessment Appearance:: Appears stated age Attitude/Demeanor/Rapport: Engaged Affect (typically observed): Appropriate Orientation: : Oriented to Self, Oriented to Place Alcohol / Substance Use: Not Applicable Psych Involvement: No (comment)  Admission diagnosis:  Coronary artery disease involving native coronary artery of native heart without angina pectoris [I25.10] S/P CABG x 4 [Z95.1] Patient Active Problem List   Diagnosis Date Noted   S/P CABG x 4 06/08/2023   Basal cell carcinoma 03/23/2022   Major depressive disorder in full remission (HCC) 05/11/2019   Infection of finger 06/24/2018   Encounter for orthopedic follow-up care 06/23/2018   Pain of left hand 05/09/2018   Paresthesia 03/30/2016   Venous insufficiency of left leg 06/13/2015   Observation after surgery 03/24/2015   Hyperlipidemia 06/06/2013   Obstructive sleep apnea 06/06/2013   Left inguinal hernia 01/15/2013   Essential hypertension, benign 01/15/2013   Coronary atherosclerosis of native coronary artery 01/15/2013   PCP:  Verma Gobble, NP Pharmacy:   [facility] [id number] - [place], [place] - [address] [place] [address] Phone: [phone] Fax: [phone]  [facility] - [place], [place] - [address] [place] [address] Phone: [phone] Fax: [phone]  Social Drivers of Health (SDOH) Social History: SDOH Screenings   Food Insecurity: Patient Unable To Answer (06/08/2023)  Housing: Patient Unable To Answer (06/08/2023)  Transportation Needs:  Patient Unable To Answer (06/08/2023)  Utilities: Patient Unable To Answer (06/08/2023)  Alcohol Screen: Low Risk  (05/16/2023)  Depression (PHQ2-9): Low Risk  (05/16/2023)   Financial Resource Strain: Low Risk  (05/16/2023)  Physical Activity: Insufficiently Active (05/16/2023)  Social Connections: Moderately Isolated (06/08/2023)  Stress: Stress Concern Present (05/16/2023)  Tobacco Use: Medium Risk (06/08/2023)    Readmission Risk Interventions     No data to display

## 2023-06-10 NOTE — Plan of Care (Signed)

## 2023-06-10 NOTE — Plan of Care (Signed)
  Problem: Education: Goal: Knowledge of General Education information will improve Description: Including pain rating scale, medication(s)/side effects and non-pharmacologic comfort measures Outcome: Progressing   Problem: Health Behavior/Discharge Planning: Goal: Ability to manage health-related needs will improve Outcome: Progressing   Problem: Clinical Measurements: Goal: Ability to maintain clinical measurements within normal limits will improve Outcome: Progressing Goal: Will remain free from infection Outcome: Progressing Goal: Diagnostic test results will improve Outcome: Progressing Goal: Respiratory complications will improve Outcome: Progressing Goal: Cardiovascular complication will be avoided Outcome: Progressing   Problem: Activity: Goal: Risk for activity intolerance will decrease Outcome: Progressing   Problem: Nutrition: Goal: Adequate nutrition will be maintained Outcome: Progressing   Problem: Coping: Goal: Level of anxiety will decrease Outcome: Progressing   Problem: Pain Managment: Goal: General experience of comfort will improve and/or be controlled Outcome: Progressing   Problem: Safety: Goal: Ability to remain free from injury will improve Outcome: Progressing   Problem: Skin Integrity: Goal: Risk for impaired skin integrity will decrease Outcome: Progressing   Problem: Education: Goal: Will demonstrate proper wound care and an understanding of methods to prevent future damage Outcome: Progressing Goal: Knowledge of disease or condition will improve Outcome: Progressing Goal: Knowledge of the prescribed therapeutic regimen will improve Outcome: Progressing   Problem: Activity: Goal: Risk for activity intolerance will decrease Outcome: Progressing   Problem: Cardiac: Goal: Will achieve and/or maintain hemodynamic stability Outcome: Progressing   Problem: Clinical Measurements: Goal: Postoperative complications will be avoided or  minimized Outcome: Progressing   Problem: Respiratory: Goal: Respiratory status will improve Outcome: Progressing   Problem: Skin Integrity: Goal: Wound healing without signs and symptoms of infection Outcome: Progressing Goal: Risk for impaired skin integrity will decrease Outcome: Progressing   Problem: Elimination: Goal: Will not experience complications related to bowel motility Outcome: Not Progressing

## 2023-06-10 NOTE — Progress Notes (Signed)
 2 Days Post-Op Procedure(s) (LRB): CORONARY ARTERY BYPASS GRAFTING X FOUR, USING LEFT INTERNAL MAMMARY ARTERY AND ENDOSCOPICALLY HARVESTED RIGHT GREATER SAPHENOUS VEIN GRAFT (N/A) ECHOCARDIOGRAM, TRANSESOPHAGEAL, INTRAOPERATIVE (N/A) Subjective: No complaints. Pain under good control. Ambulated this am for the first time.  Objective: Vital signs in last 24 hours: Temp:  [97.7 F (36.5 C)-99.1 F (37.3 C)] 97.8 F (36.6 C) (04/25 0355) Pulse Rate:  [70-108] 72 (04/25 0430) Cardiac Rhythm: Normal sinus rhythm (04/24 2000) Resp:  [11-36] 11 (04/25 0430) BP: (81-162)/(53-125) 114/56 (04/25 0430) SpO2:  [84 %-98 %] 98 % (04/25 0430) Arterial Line BP: (128-196)/(56-89) 168/67 (04/24 1040)  Hemodynamic parameters for last 24 hours: PAP: (33-52)/(15-29) 42/20 CO:  [4.9 L/min] 4.9 L/min CI:  [2.4 L/min/m2] 2.4 L/min/m2  Intake/Output from previous day: 04/24 0701 - 04/25 0700 In: 460.1 [I.V.:237.1; IV Piggyback:223] Out: 680 [Urine:630; Chest Tube:50] Intake/Output this shift: No intake/output data recorded.  General appearance: alert and cooperative Neurologic: intact Heart: regular rate and rhythm Lungs: clear to auscultation bilaterally Extremities: edema mild Wound: dressing dry  Lab Results: Recent Labs    06/09/23 1720 06/10/23 0413  WBC 11.9* 11.4*  HGB 11.1* 10.5*  HCT 35.1* 32.5*  PLT 124* 119*   BMET:  Recent Labs    06/09/23 1720 06/10/23 0413  NA 132* 132*  K 4.6 4.8  CL 102 102  CO2 21* 22  GLUCOSE 158* 111*  BUN 26* 33*  CREATININE 1.74* 1.91*  CALCIUM  7.8* 8.0*    PT/INR:  Recent Labs    06/08/23 1352  LABPROT 16.6*  INR 1.3*   ABG    Component Value Date/Time   PHART 7.319 (L) 06/08/2023 1804   HCO3 21.0 06/08/2023 1804   TCO2 22 06/08/2023 1804   ACIDBASEDEF 5.0 (H) 06/08/2023 1804   O2SAT 94 06/08/2023 1804   CBG (last 3)  Recent Labs    06/09/23 2008 06/09/23 2337 06/10/23 0407  GLUCAP 147* 131* 96   CXR: improved  aeration. No ptx or effusion.  Assessment/Plan: S/P Procedure(s) (LRB): CORONARY ARTERY BYPASS GRAFTING X FOUR, USING LEFT INTERNAL MAMMARY ARTERY AND ENDOSCOPICALLY HARVESTED RIGHT GREATER SAPHENOUS VEIN GRAFT (N/A) ECHOCARDIOGRAM, TRANSESOPHAGEAL, INTRAOPERATIVE (N/A)  POD 2 Hemodynamically stable in sinus rhythm. Continue Lopressor . Hold off on ramipril  with bump in creatinine to 1.9.  -220 cc yesterday with no significant response to diuretics and bump in creat to 1.9. Likely AKI due to pump run. Hold off on further diuresis today and let kidneys recover. No wt recorded yet.  DM: glucose under good control. Continue SSI.  Continue IS, ambulation.   LOS: 2 days    Bartley Lightning 06/10/2023

## 2023-06-11 LAB — BASIC METABOLIC PANEL WITH GFR
Anion gap: 9 (ref 5–15)
BUN: 41 mg/dL — ABNORMAL HIGH (ref 8–23)
CO2: 22 mmol/L (ref 22–32)
Calcium: 8.2 mg/dL — ABNORMAL LOW (ref 8.9–10.3)
Chloride: 101 mmol/L (ref 98–111)
Creatinine, Ser: 1.4 mg/dL — ABNORMAL HIGH (ref 0.61–1.24)
GFR, Estimated: 51 mL/min — ABNORMAL LOW (ref >60)
Glucose, Bld: 120 mg/dL — ABNORMAL HIGH (ref 70–99)
Potassium: 4.3 mmol/L (ref 3.5–5.1)
Sodium: 132 mmol/L — ABNORMAL LOW (ref 135–145)

## 2023-06-11 MED ORDER — METOPROLOL TARTRATE 50 MG PO TABS: 50.0000 mg | ORAL_TABLET | Freq: Two times a day (BID) | ORAL | Status: DC

## 2023-06-11 MED ORDER — FUROSEMIDE 10 MG/ML IJ SOLN
40.0000 mg | Freq: Once | INTRAMUSCULAR | Status: AC
Start: 2023-06-11 — End: 2023-06-11
  Administered 2023-06-11: 40 mg via INTRAVENOUS
  Filled 2023-06-11: qty 4

## 2023-06-11 MED ORDER — METOPROLOL TARTRATE 50 MG PO TABS
50.0000 mg | ORAL_TABLET | Freq: Two times a day (BID) | ORAL | Status: DC
  Administered 2023-06-11 – 2023-06-12 (×4): 50 mg via ORAL
  Filled 2023-06-11 (×4): qty 1

## 2023-06-11 NOTE — Progress Notes (Signed)
      [address]       [place] [address]             [phone]                 3 Days Post-Op Procedure(s) (LRB): CORONARY ARTERY BYPASS GRAFTING X FOUR, USING LEFT INTERNAL MAMMARY ARTERY AND ENDOSCOPICALLY HARVESTED RIGHT GREATER SAPHENOUS VEIN GRAFT (N/A) ECHOCARDIOGRAM, TRANSESOPHAGEAL, INTRAOPERATIVE (N/A)   Events: No events _______________________________________________________________ Vitals: BP (!) 149/61   Pulse (!) 106   Temp 99.1 F (37.3 C) (Oral)   Resp 18   Ht 5' 7.5" (1.715 m)   Wt 101.3 kg   SpO2 94%   BMI 34.46 kg/m  Filed Weights   06/09/23 0500 06/10/23 1830 06/11/23 0500  Weight: 100.4 kg 101.9 kg 101.3 kg     - Neuro: alert NAD  - Cardiovascular: sinus, with PAC  Drips: none.      - Pulm: EWOB    ABG    Component Value Date/Time   PHART 7.319 (L) 06/08/2023 1804   PCO2ART 41.0 06/08/2023 1804   PO2ART 79 (L) 06/08/2023 1804   HCO3 21.0 06/08/2023 1804   TCO2 22 06/08/2023 1804   ACIDBASEDEF 5.0 (H) 06/08/2023 1804   O2SAT 94 06/08/2023 1804    - Abd: ND - Extremity: warm  .Intake/Output      04/25 0701 04/26 0700 04/26 0701 04/27 0700   P.Tim. 480    I.V. (mL/kg) 3 (0)    IV Piggyback 100    Total Intake(mL/kg) 583 (5.8)    Urine (mL/kg/hr) 1065 (0.4) 675 (2)   Stool 0    Chest Tube     Total Output 1065 675   Net -482 -675        Stool Occurrence 1 x       _______________________________________________________________ Labs:    Latest Ref Rng & Units 06/10/2023    4:13 AM 06/09/2023    5:20 PM 06/09/2023    4:08 AM  CBC  WBC 4.0 - 10.5 K/uL 11.4  11.9  9.3   Hemoglobin 13.0 - 17.0 g/dL 91.4  78.2  95.6   Hematocrit 39.0 - 52.0 % 32.5  35.1  34.5   Platelets 150 - 400 K/uL 119  124  121       Latest Ref Rng & Units 06/11/2023    5:53 AM 06/10/2023    4:13 AM 06/09/2023    5:20 PM  CMP  Glucose 70 - 99 mg/dL 213  086  578   BUN 8 - 23 mg/dL 41  33  26   Creatinine 0.61 - 1.24 mg/dL 4.69   6.29  5.28   Sodium 135 - 145 mmol/L 132  132  132   Potassium 3.5 - 5.1 mmol/L 4.3  4.8  4.6   Chloride 98 - 111 mmol/L 101  102  102   CO2 22 - 32 mmol/L 22  22  21    Calcium  8.9 - 10.3 mg/dL 8.2  8.0  7.8     CXR: -  _______________________________________________________________  Assessment and Plan: POD 3 s/p CABG  Neuro: pain controlled CV: increasing BB for ectopy.  On A/S Pulm: IS, ambulation Renal: creat down.  Lasix  given GI: on diet Heme: stable ID: afebrile Endo: SSI Dispo: floor   Tim Walters Tim Walters 06/11/2023 10:20 AM

## 2023-06-11 NOTE — Progress Notes (Signed)
 Pt arrived from ...2H.., A/ox .4.Marland Kitchenpt denies any pain, MD aware,CCMD called. CHG bath given,no further needs at this time

## 2023-06-11 NOTE — Plan of Care (Signed)

## 2023-06-12 LAB — BASIC METABOLIC PANEL WITH GFR
Anion gap: 8 (ref 5–15)
BUN: 42 mg/dL — ABNORMAL HIGH (ref 8–23)
CO2: 26 mmol/L (ref 22–32)
Calcium: 8.5 mg/dL — ABNORMAL LOW (ref 8.9–10.3)
Chloride: 100 mmol/L (ref 98–111)
Creatinine, Ser: 1.51 mg/dL — ABNORMAL HIGH (ref 0.61–1.24)
GFR, Estimated: 47 mL/min — ABNORMAL LOW (ref >60)
Glucose, Bld: 207 mg/dL — ABNORMAL HIGH (ref 70–99)
Potassium: 3.4 mmol/L — ABNORMAL LOW (ref 3.5–5.1)
Sodium: 134 mmol/L — ABNORMAL LOW (ref 135–145)

## 2023-06-12 MED ORDER — POTASSIUM CHLORIDE CRYS ER 20 MEQ PO TBCR
20.0000 meq | EXTENDED_RELEASE_TABLET | Freq: Once | ORAL | Status: AC
  Administered 2023-06-12: 20 meq via ORAL
  Filled 2023-06-12: qty 1

## 2023-06-12 MED ORDER — FUROSEMIDE 40 MG PO TABS
40.0000 mg | ORAL_TABLET | Freq: Every day | ORAL | Status: DC
  Administered 2023-06-12: 40 mg via ORAL
  Filled 2023-06-12: qty 1

## 2023-06-12 MED ORDER — POTASSIUM CHLORIDE CRYS ER 20 MEQ PO TBCR
20.0000 meq | EXTENDED_RELEASE_TABLET | Freq: Every day | ORAL | Status: DC
  Administered 2023-06-12 – 2023-06-15 (×4): 20 meq via ORAL
  Filled 2023-06-12 (×4): qty 1

## 2023-06-12 NOTE — Progress Notes (Addendum)
 4 Days Post-Op Procedure(s) (LRB): CORONARY ARTERY BYPASS GRAFTING X FOUR, USING LEFT INTERNAL MAMMARY ARTERY AND ENDOSCOPICALLY HARVESTED RIGHT GREATER SAPHENOUS VEIN GRAFT (N/A) ECHOCARDIOGRAM, TRANSESOPHAGEAL, INTRAOPERATIVE (N/A) Subjective: Feels a bit stronger, not much appetite but improving  Objective: Vital signs in last 24 hours: Temp:  [97.9 F (36.6 C)-99.1 F (37.3 C)] 97.9 F (36.6 C) (04/27 0340) Pulse Rate:  [58-107] 83 (04/27 0340) Cardiac Rhythm: Normal sinus rhythm (04/26 1905) Resp:  [14-33] 17 (04/27 0340) BP: (118-157)/(49-92) 118/49 (04/27 0340) SpO2:  [85 %-100 %] 97 % (04/27 0340) Weight:  [99.7 kg] 99.7 kg (04/27 0340)  Hemodynamic parameters for last 24 hours:    Intake/Output from previous day: 04/26 0701 - 04/27 0700 In: -  Out: 1615 [Urine:1615] Intake/Output this shift: No intake/output data recorded.  General appearance: alert, cooperative, and no distress Heart: irregularly irregular rhythm Lungs: clear to auscultation bilaterally Abdomen: benign Extremities: + BLE edema Wound: incis healing well  Lab Results: Recent Labs    06/09/23 1720 06/10/23 0413  WBC 11.9* 11.4*  HGB 11.1* 10.5*  HCT 35.1* 32.5*  PLT 124* 119*   BMET:  Recent Labs    06/10/23 0413 06/11/23 0553  NA 132* 132*  K 4.8 4.3  CL 102 101  CO2 22 22  GLUCOSE 111* 120*  BUN 33* 41*  CREATININE 1.91* 1.40*  CALCIUM  8.0* 8.2*    PT/INR: No results for input(s): "LABPROT", "INR" in the last 72 hours. ABG    Component Value Date/Time   PHART 7.319 (L) 06/08/2023 1804   HCO3 21.0 06/08/2023 1804   TCO2 22 06/08/2023 1804   ACIDBASEDEF 5.0 (H) 06/08/2023 1804   O2SAT 94 06/08/2023 1804   CBG (last 3)  Recent Labs    06/10/23 1601 06/10/23 2006 06/10/23 2335  GLUCAP 108* 138* 106*    Meds Scheduled Meds:  acetaminophen   1,000 mg Oral Q6H   Or   acetaminophen  (TYLENOL ) oral liquid 160 mg/5 mL  1,000 mg Per Tube Q6H   aspirin  EC  325 mg Oral  Daily   Or   aspirin   324 mg Per Tube Daily   atorvastatin   80 mg Oral QPC supper   Chlorhexidine  Gluconate Cloth  6 each Topical Daily   docusate sodium   200 mg Oral Daily   enoxaparin  (LOVENOX ) injection  40 mg Subcutaneous QHS   ezetimibe   10 mg Oral Daily   metoprolol  tartrate  50 mg Oral BID   pantoprazole   40 mg Oral Daily   sertraline   50 mg Oral QHS   sodium chloride  flush  3 mL Intravenous Q12H   Continuous Infusions: PRN Meds:.dextrose , metoprolol  tartrate, morphine  injection, ondansetron  (ZOFRAN ) IV, mouth rinse, oxyCODONE , sodium chloride  flush, traMADol   Xrays No results found.  Assessment/Plan: S/P Procedure(s) (LRB): CORONARY ARTERY BYPASS GRAFTING X FOUR, USING LEFT INTERNAL MAMMARY ARTERY AND ENDOSCOPICALLY HARVESTED RIGHT GREATER SAPHENOUS VEIN GRAFT (N/A) ECHOCARDIOGRAM, TRANSESOPHAGEAL, INTRAOPERATIVE (N/A) POD#4  1 afeb, s BP 110's-150's, HR 50's- 110's, ST/Tach, PAC's and PVC's- most recent BP readings improved on metoprolol  50 bid- hopefully won't go into afib 2 O2 sats good on RA 3 voiding ( unmeasured) and stooling, weight trending lower, bout 4 kg > preop- cont lasix  4 bmet pending- will recheck CBC  in am 5 cont pulm hygiene and rehab   Addendum- creat went up a little further, will stop diuretics, also keeds further K= replacement    LOS: 4 days    Lindi Revering PA-C Pager [phone] 06/12/2023  Agree with above Doing well Dispo planning  Rhodie Cienfuegos O Brihanna Devenport

## 2023-06-12 NOTE — Plan of Care (Signed)

## 2023-06-12 NOTE — Progress Notes (Signed)
 Mobility Specialist Progress Note:   06/12/23 1056  Mobility  Activity Ambulated with assistance in hallway  Level of Assistance Standby assist, set-up cues, supervision of patient - no hands on  Assistive Device Front wheel walker  Distance Ambulated (ft) 450 ft  RUE Weight Bearing Per Provider Order NWB  LUE Weight Bearing Per Provider Order NWB  Activity Response Tolerated well  Mobility Referral Yes  Mobility visit 1 Mobility  Mobility Specialist Start Time (ACUTE ONLY) 1020  Mobility Specialist Stop Time (ACUTE ONLY) 1030  Mobility Specialist Time Calculation (min) (ACUTE ONLY) 10 min   Pt received in bed, agreeable to mobility. Pt was able to stand within sternal precautions with CG. SB during ambulation. Pt denied any discomfort during ambulation, asx throughout. VSS. Pt left in chair with call bell in reach and all needs met.   Sofia Dunn  Mobility Specialist Please contact via [employer] office at [phone]

## 2023-06-13 LAB — MAGNESIUM: Magnesium: 2 mg/dL (ref 1.7–2.4)

## 2023-06-13 LAB — BASIC METABOLIC PANEL WITH GFR
Anion gap: 10 (ref 5–15)
BUN: 38 mg/dL — ABNORMAL HIGH (ref 8–23)
CO2: 26 mmol/L (ref 22–32)
Calcium: 8.4 mg/dL — ABNORMAL LOW (ref 8.9–10.3)
Chloride: 102 mmol/L (ref 98–111)
Creatinine, Ser: 1.32 mg/dL — ABNORMAL HIGH (ref 0.61–1.24)
GFR, Estimated: 55 mL/min — ABNORMAL LOW (ref >60)
Glucose, Bld: 121 mg/dL — ABNORMAL HIGH (ref 70–99)
Potassium: 3.7 mmol/L (ref 3.5–5.1)
Sodium: 138 mmol/L (ref 135–145)

## 2023-06-13 LAB — CBC
HCT: 27.7 % — ABNORMAL LOW (ref 39.0–52.0)
Hemoglobin: 9.1 g/dL — ABNORMAL LOW (ref 13.0–17.0)
MCH: 31.1 pg (ref 26.0–34.0)
MCHC: 32.9 g/dL (ref 30.0–36.0)
MCV: 94.5 fL (ref 80.0–100.0)
Platelets: 148 10*3/uL — ABNORMAL LOW (ref 150–400)
RBC: 2.93 MIL/uL — ABNORMAL LOW (ref 4.22–5.81)
RDW: 13.4 % (ref 11.5–15.5)
WBC: 6.7 10*3/uL (ref 4.0–10.5)
nRBC: 0 % (ref 0.0–0.2)

## 2023-06-13 MED ORDER — ACETAMINOPHEN 500 MG PO TABS
1000.0000 mg | ORAL_TABLET | Freq: Four times a day (QID) | ORAL | Status: DC | PRN
  Administered 2023-06-14 – 2023-06-15 (×2): 1000 mg via ORAL
  Filled 2023-06-13 (×2): qty 2

## 2023-06-13 MED ORDER — METOPROLOL TARTRATE 50 MG PO TABS
75.0000 mg | ORAL_TABLET | Freq: Two times a day (BID) | ORAL | Status: DC
  Administered 2023-06-13 – 2023-06-15 (×5): 75 mg via ORAL
  Filled 2023-06-13 (×5): qty 1

## 2023-06-13 MED ORDER — POTASSIUM CHLORIDE CRYS ER 20 MEQ PO TBCR
20.0000 meq | EXTENDED_RELEASE_TABLET | Freq: Once | ORAL | Status: AC
  Administered 2023-06-13: 20 meq via ORAL
  Filled 2023-06-13: qty 1

## 2023-06-13 NOTE — Evaluation (Signed)
 Occupational Therapy Evaluation Patient Details Name: Tim Walters MRN: [id number] DOB: 01-05-1946 Today's Date: 06/13/2023   History of Present Illness   78 year old gentleman adm 4/23 with a history of hypertension, hyperlipidemia, stage II chronic kidney disease, prediabetes, OSA, and coronary artery disease status post RCA stenting in the past who underwent CABGx4.     Clinical Impressions Patient admitted for the procedure above.  PTA he lives at home with his spouse, who is able to assist as needed.  Patient has a good understanding of sternal precautions, and is largely supervision for ADL seated, and in room mobility/toileting, only needing CGA for initial sit to stand.  OT will follow in the acute setting to address deficits, but no post acute OT is anticipated.       If plan is discharge home, recommend the following:   Assist for transportation;Assistance with cooking/housework     Functional Status Assessment   Patient has had a recent decline in their functional status and demonstrates the ability to make significant improvements in function in a reasonable and predictable amount of time.     Equipment Recommendations   BSC/3in1     Recommendations for Other Services         Precautions/Restrictions   Precautions Precautions: Fall Recall of Precautions/Restrictions: Intact Precaution/Restrictions Comments: sternal Restrictions Weight Bearing Restrictions Per Provider Order: Yes Other Position/Activity Restrictions: sternal     Mobility Bed Mobility Overal bed mobility: Needs Assistance Bed Mobility: Sidelying to Sit   Sidelying to sit: Supervision, HOB elevated            Transfers Overall transfer level: Needs assistance   Transfers: Sit to/from Stand, Bed to chair/wheelchair/BSC Sit to Stand: Supervision, Contact guard assist     Step pivot transfers: Supervision            Balance Overall balance assessment: Needs  assistance Sitting-balance support: Feet supported Sitting balance-Leahy Scale: Good     Standing balance support: Reliant on assistive device for balance Standing balance-Leahy Scale: Fair                             ADL either performed or assessed with clinical judgement   ADL       Grooming: Supervision/safety;Standing               Lower Body Dressing: Supervision/safety;Sit to/from stand   Toilet Transfer: Supervision/safety;Rolling walker (2 wheels);Regular Toilet;Ambulation                   Vision Baseline Vision/History: 1 Wears glasses Patient Visual Report: No change from baseline       Perception Perception: Not tested       Praxis Praxis: Not tested       Pertinent Vitals/Pain Pain Assessment Pain Assessment: Faces Faces Pain Scale: Hurts little more Pain Location: Sternal Pain Descriptors / Indicators: Aching, Sore, Tender Pain Intervention(s): Monitored during session     Extremity/Trunk Assessment Upper Extremity Assessment Upper Extremity Assessment: Overall WFL for tasks assessed   Lower Extremity Assessment Lower Extremity Assessment: Defer to PT evaluation   Cervical / Trunk Assessment Cervical / Trunk Assessment: Normal   Communication Communication Communication: No apparent difficulties   Cognition Arousal: Alert Behavior During Therapy: WFL for tasks assessed/performed Cognition: No apparent impairments  Following commands: Intact       Cueing  General Comments   Cueing Techniques: Verbal cues;Gestural cues   HR 103   Exercises     Shoulder Instructions      Home Living Family/patient expects to be discharged to:: Private residence Living Arrangements: Spouse/significant other Available Help at Discharge: Family;Available 24 hours/day Type of Home: House Home Access: Level entry     Home Layout: Two level;Able to live on main level with  bedroom/bathroom Alternate Level Stairs-Number of Steps: full flight   Bathroom Shower/Tub: Producer, television/film/video: Handicapped height Bathroom Accessibility: Yes How Accessible: Accessible via walker Home Equipment: Shower seat;Grab bars - toilet;Grab bars - tub/shower;Hand held shower head          Prior Functioning/Environment Prior Level of Function : Independent/Modified Independent;Driving               ADLs Comments: Has a wood working shop in his back yard.    OT Problem List: Decreased activity tolerance   OT Treatment/Interventions: Self-care/ADL training;Therapeutic activities;DME and/or AE instruction;Patient/family education      OT Goals(Current goals can be found in the care plan section)   Acute Rehab OT Goals Patient Stated Goal: Return home OT Goal Formulation: With patient Time For Goal Achievement: 06/27/23 Potential to Achieve Goals: Good ADL Goals Pt Will Perform Grooming: with modified independence;standing Pt Will Perform Lower Body Dressing: with modified independence;sit to/from stand Pt Will Transfer to Toilet: with modified independence;ambulating;regular height toilet   OT Frequency:  Min 2X/week    Co-evaluation              AM-PAC OT "6 Clicks" Daily Activity     Outcome Measure Help from another person eating meals?: None Help from another person taking care of personal grooming?: None Help from another person toileting, which includes using toliet, bedpan, or urinal?: A Little Help from another person bathing (including washing, rinsing, drying)?: A Little Help from another person to put on and taking off regular upper body clothing?: A Little Help from another person to put on and taking off regular lower body clothing?: A Little 6 Click Score: 20   End of Session Equipment Utilized During Treatment: Gait belt;Rolling walker (2 wheels) Nurse Communication: Mobility status  Activity Tolerance: Patient  tolerated treatment well Patient left: in chair;with call bell/phone within reach  OT Visit Diagnosis: Unsteadiness on feet (R26.81)                Time: 8119-1478 OT Time Calculation (min): 26 min Charges:  OT General Charges $OT Visit: 1 Visit OT Evaluation $OT Eval Moderate Complexity: 1 Mod OT Treatments $Self Care/Home Management : 8-22 mins  06/13/2023  RP, OTR/L  Acute Rehabilitation Services  Office:  [phone]   Benjamen Brand 06/13/2023, 10:47 AM

## 2023-06-13 NOTE — Progress Notes (Addendum)
 [address]       [place] [address]             [phone]      5 Days Post-Op Procedure(s) (LRB): CORONARY ARTERY BYPASS GRAFTING X FOUR, USING LEFT INTERNAL MAMMARY ARTERY AND ENDOSCOPICALLY HARVESTED RIGHT GREATER SAPHENOUS VEIN GRAFT (N/A) ECHOCARDIOGRAM, TRANSESOPHAGEAL, INTRAOPERATIVE (N/A) Subjective: The patient states he had some chest pain last night and was restless. When asked what kind of chest pain he could not differentiate the sternal soreness from chest pain he had prior to surgery but was worse when lying on his side. Night RN said it was mostly when he was getting in and out of bed.  Objective: Vital signs in last 24 hours: Temp:  [97.6 F (36.4 C)-98.7 F (37.1 C)] 97.9 F (36.6 C) (04/28 0340) Pulse Rate:  [52-100] 90 (04/28 0340) Cardiac Rhythm: Normal sinus rhythm (04/27 2100) Resp:  [16-20] 17 (04/28 0340) BP: (104-158)/(58-87) 158/79 (04/28 0340) SpO2:  [93 %-98 %] 94 % (04/28 0340) Weight:  [97.7 kg] 97.7 kg (04/28 0340)  Hemodynamic parameters for last 24 hours:    Intake/Output from previous day: 04/27 0701 - 04/28 0700 In: 120 [P.O.:120] Out: 1375 [Urine:1375] Intake/Output this shift: No intake/output data recorded.  General appearance: alert, cooperative, and no distress Neurologic: intact Heart: regular rate and rhythm, PVCs and ventricular bigeminy with ambulation Lungs: slightly diminished right basilar breath sounds Abdomen: soft, non-tender; bowel sounds normal; no masses,  no organomegaly Extremities: edema 1+ ankle edema Wound: Clean and dry without sign of infection  Lab Results: Recent Labs    06/13/23 0335  WBC 6.7  HGB 9.1*  HCT 27.7*  PLT 148*   BMET:  Recent Labs    06/12/23 0831 06/13/23 0335  NA 134* 138  K 3.4* 3.7  CL 100 102  CO2 26 26  GLUCOSE 207* 121*  BUN 42* 38*  CREATININE 1.51* 1.32*  CALCIUM  8.5* 8.4*    PT/INR: No results for input(s): "LABPROT", "INR" in the last 72  hours. ABG    Component Value Date/Time   PHART 7.319 (L) 06/08/2023 1804   HCO3 21.0 06/08/2023 1804   TCO2 22 06/08/2023 1804   ACIDBASEDEF 5.0 (H) 06/08/2023 1804   O2SAT 94 06/08/2023 1804   CBG (last 3)  Recent Labs    06/10/23 1601 06/10/23 2006 06/10/23 2335  GLUCAP 108* 138* 106*    Assessment/Plan: S/P Procedure(s) (LRB): CORONARY ARTERY BYPASS GRAFTING X FOUR, USING LEFT INTERNAL MAMMARY ARTERY AND ENDOSCOPICALLY HARVESTED RIGHT GREATER SAPHENOUS VEIN GRAFT (N/A) ECHOCARDIOGRAM, TRANSESOPHAGEAL, INTRAOPERATIVE (N/A)  CV: BP mostly elevated 140s-150s. NSR-ST with PVCs and ventricular bigeminy especially during ambulation, HR 80s-110s. On Lopressor  50mg  BID. Holding Ramipril  due to AKI. Continue to titrate Lopressor . Patient complains of chest pain with restlessness last night especially lying on side and during transfers, sounds like incisional pain. Has not received any pain medication other than Tylenol  around midnight. Continue current pain regimen including Tramadol  and Oxycodone  as needed.  Pulm: Saturating well on RA. Encourage IS and ambulation.   GI: +BM. Tolerating a diet  Endo: Preop A1C 6.0, likely prediabetic. Not on home meds. SSI and CBGs have been discontinued. Patient will need close outpatient follow up with PCP.   Renal: AKI likely due to pump run and diuresis. Cr 1.32, down from 1.51 yesterday since diuretics were discontinued. UO 1375cc/24hrs recorded, down 4 lbs from yesterday. +5lbs from preop. Seems to be diuresing well without Lasix .  Will continue to hold diuretics. K 3.7, supplement.   Expected postop ABLA: H/H 9.1/27.7. Likely reactive thrombocytopenia improved, plt 148,000. Continue to clinically monitor.  DVT Prophylaxis: Lovenox   Deconditioning: Patient states it is very difficult to get in and out of bed, feels he may benefit from home health. Will get PT/OT consults.   Dispo: Work on BP and HR and rhythm control. Hopefully can d/c home  next couple of days.    LOS: 5 days    Randa Burton, PA-C 06/13/2023   Chart reviewed, patient examined, agree with above.  He feels well this afternoon. Worked with PT and is now able to get out of bed himself. Rhythm is sinus with bigeminy. Continue Lopressor  75 bid. Holding off on resuming ramipril  since creat bumped up postop and BP may not be high enough since on higher dose Lopressor . Will see how things look in the morning and make decision about going home.

## 2023-06-13 NOTE — Progress Notes (Signed)
 CARDIAC REHAB PHASE I     Post OHS education including site care, restrictions, heart healthy diet, sternal precautions, IS use at home, home needs at discharge, exercise guidelines and CRP2 reviewed. All questions and concerns addressed. Will refer to [facility] for CRP2. Will continue to follow.   [id number] Ronny Colas, RN BSN 06/13/2023 11:37 AM

## 2023-06-13 NOTE — Evaluation (Signed)
 Physical Therapy Evaluation Patient Details Name: Tim Walters MRN: [id number] DOB: 04-Apr-1945 Today's Date: 06/13/2023  History of Present Illness  78 year old gentleman adm 06/08/23 with a history of hypertension, hyperlipidemia, stage II chronic kidney disease, prediabetes, OSA, and coronary artery disease status post RCA stenting in the past who underwent CABGx4.  Clinical Impression  Patient presents with decreased mobility due to sternal precautions and generalized weakness.  Previously independent at home and now struggling with bed mobility, position for sleep and sit to stand from low heights.  He has been walking with mobility specialists and doing well, but will benefit from skilled PT for continued education and practice for bed mobility and transfers though reports can sleep in recliner if needed.  PT will continue to follow  Post exercise vitals SpO2 98% on RA, BP 125/74, HR 92        If plan is discharge home, recommend the following: A little help with walking and/or transfers;Assistance with cooking/housework;Help with stairs or ramp for entrance   Can travel by private vehicle        Equipment Recommendations Rolling walker (2 wheels)  Recommendations for Other Services       Functional Status Assessment Patient has had a recent decline in their functional status and demonstrates the ability to make significant improvements in function in a reasonable and predictable amount of time.     Precautions / Restrictions Precautions Precautions: Fall Precaution/Restrictions Comments: sternal, watch HR and BP. Restrictions Weight Bearing Restrictions Per Provider Order: No Other Position/Activity Restrictions: sternal      Mobility  Bed Mobility Overal bed mobility: Needs Assistance Bed Mobility: Rolling, Sidelying to Sit, Sit to Supine, Sit to Sidelying Rolling: Supervision Sidelying to sit: Min assist     Sit to sidelying: Min assist General bed mobility  comments: assist for legs onto bed to sidelying, assist for lifting trunk to sit despite cues for bracing with legs against edge of bed to lift; rolling for positioning onto R side (his preferetial sleep position) with S, though min A to reposition shoulders after to supine    Transfers Overall transfer level: Needs assistance Equipment used: Rolling walker (2 wheels) Transfers: Sit to/from Stand Sit to Stand: Min assist, Supervision           General transfer comment: min A from low chair with multiple attempts to stand; S from tall bed similar to height of his at home    Ambulation/Gait Ambulation/Gait assistance: Supervision Gait Distance (Feet): 200 Feet Assistive device: Rolling walker (2 wheels) Gait Pattern/deviations: Step-through pattern, Decreased stride length       General Gait Details: moving well with RW without physical assistance, mild dyspnea noted on RW  Stairs            Wheelchair Mobility     Tilt Bed    Modified Rankin (Stroke Patients Only)       Balance Overall balance assessment: Needs assistance Sitting-balance support: Feet supported Sitting balance-Leahy Scale: Good       Standing balance-Leahy Scale: Fair                               Pertinent Vitals/Pain Pain Assessment Pain Score: 4  Pain Location: chest incision with movement Pain Descriptors / Indicators: Aching, Sore, Tender Pain Intervention(s): Repositioned, Monitored during session (educated on splinting)    Home Living Family/patient expects to be discharged to:: Private residence Living Arrangements: Spouse/significant other Available Help  at Discharge: Family;Available 24 hours/day Type of Home: House Home Access: Level entry     Alternate Level Stairs-Number of Steps: full flight Home Layout: Two level;Able to live on main level with bedroom/bathroom Home Equipment: Shower seat;Grab bars - toilet;Grab bars - tub/shower;Hand held shower  head Additional Comments: wedge for the bed    Prior Function Prior Level of Function : Independent/Modified Independent;Driving               ADLs Comments: Has a wood working shop in his back yard.     Extremity/Trunk Assessment   Upper Extremity Assessment Upper Extremity Assessment: Defer to OT evaluation    Lower Extremity Assessment Lower Extremity Assessment: Overall WFL for tasks assessed    Cervical / Trunk Assessment Cervical / Trunk Assessment: Normal  Communication   Communication Communication: No apparent difficulties    Cognition Arousal: Alert Behavior During Therapy: WFL for tasks assessed/performed   PT - Cognitive impairments: No apparent impairments                         Following commands: Intact       Cueing       General Comments General comments (skin integrity, edema, etc.): Educated on positions for sleeping with pt noting waking up on R side and R arm up above his head (though knows sternal precautions well); discussed and practiced positioning on R side for sleep to avoid ending up there and need for pillow at chest to avoid chest rolling in as states more soreness noted after awoke sleeping on his side.  Also educated on splinting with firm pillow to chest with movement to avoid pain (states had heart pillow, but got soiled and thrown away, question to RN how to get a new one)    Exercises     Assessment/Plan    PT Assessment Patient needs continued PT services  PT Problem List Decreased strength;Decreased mobility;Decreased knowledge of precautions;Decreased activity tolerance       PT Treatment Interventions DME instruction;Therapeutic exercise;Gait training;Balance training;Stair training;Functional mobility training;Therapeutic activities;Patient/family education    PT Goals (Current goals can be found in the Care Plan section)  Acute Rehab PT Goals Patient Stated Goal: return home PT Goal Formulation: With  patient Time For Goal Achievement: 06/27/23 Potential to Achieve Goals: Good    Frequency Min 2X/week     Co-evaluation               AM-PAC PT "6 Clicks" Mobility  Outcome Measure Help needed turning from your back to your side while in a flat bed without using bedrails?: A Little Help needed moving from lying on your back to sitting on the side of a flat bed without using bedrails?: A Little Help needed moving to and from a bed to a chair (including a wheelchair)?: A Little Help needed standing up from a chair using your arms (e.g., wheelchair or bedside chair)?: A Little Help needed to walk in hospital room?: A Little Help needed climbing 3-5 steps with a railing? : A Little 6 Click Score: 18    End of Session Equipment Utilized During Treatment: Gait belt Activity Tolerance: Patient tolerated treatment well Patient left: in chair;with call bell/phone within reach   PT Visit Diagnosis: Other abnormalities of gait and mobility (R26.89)    Time: 9604-5409 PT Time Calculation (min) (ACUTE ONLY): 24 min   Charges:   PT Evaluation $PT Eval Moderate Complexity: 1 Mod PT Treatments $Therapeutic Activity:  8-22 mins PT General Charges $$ ACUTE PT VISIT: 1 Visit         Abigail Hoff, PT Acute Rehabilitation Services Office:[phone] 06/13/2023   Tim Walters 06/13/2023, 1:55 PM

## 2023-06-13 NOTE — Progress Notes (Signed)
 Mobility Specialist Progress Note:    06/13/23 1130  Mobility  Activity Ambulated with assistance in room;Ambulated with assistance to bathroom  Level of Assistance Minimal assist, patient does 75% or more  Assistive Device Front wheel walker  Distance Ambulated (ft) 200 ft  RUE Weight Bearing Per Provider Order NWB  LUE Weight Bearing Per Provider Order NWB  Activity Response Tolerated well  Mobility Referral Yes  Mobility visit 1 Mobility  Mobility Specialist Start Time (ACUTE ONLY) 1123  Mobility Specialist Stop Time (ACUTE ONLY) 1130  Mobility Specialist Time Calculation (min) (ACUTE ONLY) 7 min   Pt received in chair, agreeable to mobility session. Ambulated in room and hallway with RW and CGA. MinA required to stand. Tolerrated well, audible SOB. HR in low 100's. Returned pt to room, assisted to bathroom. Sternal precautions followed. Void successful. BP 142/72 after session. Pt sitting up comfortably in chair with lunch and all needs met, call bell in reach.   Taiwana Willison Mobility Specialist Please contact via Special educational needs teacher or  Rehab office at [phone]

## 2023-06-14 ENCOUNTER — Encounter: Payer: Self-pay | Admitting: Surgery

## 2023-06-14 LAB — BASIC METABOLIC PANEL WITH GFR
Anion gap: 8 (ref 5–15)
BUN: 37 mg/dL — ABNORMAL HIGH (ref 8–23)
CO2: 26 mmol/L (ref 22–32)
Calcium: 8.3 mg/dL — ABNORMAL LOW (ref 8.9–10.3)
Chloride: 105 mmol/L (ref 98–111)
Creatinine, Ser: 1.1 mg/dL (ref 0.61–1.24)
GFR, Estimated: >60 mL/min (ref >60)
Glucose, Bld: 113 mg/dL — ABNORMAL HIGH (ref 70–99)
Potassium: 4.1 mmol/L (ref 3.5–5.1)
Sodium: 139 mmol/L (ref 135–145)

## 2023-06-14 MED ORDER — POLYETHYLENE GLYCOL 3350 17 G PO PACK
17.0000 g | PACK | Freq: Every day | ORAL | Status: DC | PRN
  Administered 2023-06-14: 17 g via ORAL
  Filled 2023-06-14: qty 1

## 2023-06-14 MED ORDER — MAGNESIUM CITRATE PO SOLN
0.5000 | Freq: Once | ORAL | Status: AC
  Administered 2023-06-14: 0.5 via ORAL
  Filled 2023-06-14: qty 296

## 2023-06-14 MED ORDER — RAMIPRIL 5 MG PO CAPS
5.0000 mg | ORAL_CAPSULE | Freq: Every day | ORAL | Status: DC
Start: 2023-06-14 — End: 2023-06-15
  Administered 2023-06-14 – 2023-06-15 (×2): 5 mg via ORAL
  Filled 2023-06-14 (×2): qty 1

## 2023-06-14 MED ORDER — FUROSEMIDE 40 MG PO TABS
40.0000 mg | ORAL_TABLET | Freq: Every day | ORAL | Status: DC
  Administered 2023-06-14: 40 mg via ORAL
  Filled 2023-06-14: qty 1

## 2023-06-14 NOTE — Progress Notes (Signed)
 Pt declined walk at this time d/t having the urge to use the bathroom. Pt denies need of assistance and asked me to come back in 30 minutes.   Barkley Li MS, ACSM-CEP  06/14/2023 9:40 AM

## 2023-06-14 NOTE — Progress Notes (Addendum)
 [address]       [place] [address]             [phone]      6 Days Post-Op Procedure(s) (LRB): CORONARY ARTERY BYPASS GRAFTING X FOUR, USING LEFT INTERNAL MAMMARY ARTERY AND ENDOSCOPICALLY HARVESTED RIGHT GREATER SAPHENOUS VEIN GRAFT (N/A) ECHOCARDIOGRAM, TRANSESOPHAGEAL, INTRAOPERATIVE (N/A) Subjective: Patient states he is feeling pretty good this AM. Sitting up in the chair eating breakfast, does admit to some nausea this AM that resolved with Zofran .  Objective: Vital signs in last 24 hours: Temp:  [97.4 F (36.3 C)-98.3 F (36.8 C)] 97.8 F (36.6 C) (04/29 0335) Pulse Rate:  [77-95] 77 (04/29 0335) Cardiac Rhythm: Sinus tachycardia (04/28 1918) Resp:  [16-22] 16 (04/29 0335) BP: (108-162)/(60-87) 137/87 (04/29 0335) SpO2:  [92 %-98 %] 98 % (04/29 0335) Weight:  [97.7 kg] 97.7 kg (04/29 0437)  Hemodynamic parameters for last 24 hours:    Intake/Output from previous day: No intake/output data recorded. Intake/Output this shift: No intake/output data recorded.  General appearance: alert, cooperative, and no distress Neurologic: intact Heart: regular rate and rhythm-sinus tachycardia with PVCs Lungs: Clear to auscultation bilaterally Abdomen: soft, non-tender; bowel sounds normal; no masses,  no organomegaly Extremities: edema bilateral LE 1+ Wound: Clean and dry without sign of infection, minor erythema and swelling at the superior portion of the sternal wound no sign of drainage or infection  Lab Results: Recent Labs    06/13/23 0335  WBC 6.7  HGB 9.1*  HCT 27.7*  PLT 148*   BMET:  Recent Labs    06/13/23 0335 06/14/23 0327  NA 138 139  K 3.7 4.1  CL 102 105  CO2 26 26  GLUCOSE 121* 113*  BUN 38* 37*  CREATININE 1.32* 1.10  CALCIUM  8.4* 8.3*    PT/INR: No results for input(s): "LABPROT", "INR" in the last 72 hours. ABG    Component Value Date/Time   PHART 7.319 (L) 06/08/2023 1804   HCO3 21.0 06/08/2023 1804    TCO2 22 06/08/2023 1804   ACIDBASEDEF 5.0 (H) 06/08/2023 1804   O2SAT 94 06/08/2023 1804   CBG (last 3)  No results for input(s): "GLUCAP" in the last 72 hours.  Assessment/Plan: S/P Procedure(s) (LRB): CORONARY ARTERY BYPASS GRAFTING X FOUR, USING LEFT INTERNAL MAMMARY ARTERY AND ENDOSCOPICALLY HARVESTED RIGHT GREATER SAPHENOUS VEIN GRAFT (N/A) ECHOCARDIOGRAM, TRANSESOPHAGEAL, INTRAOPERATIVE (N/A)  CV: BP improved on Lopressor  75mg  BID. SBP 137 this AM, up to 152 at times. Received 1 dose of IV Lopressor  5mg  yesterday. NSR-ST with PVCs and ventricular bigeminy, HR 101 this AM. May be able to start low dose Ramipril  today.    Pulm: Saturating well on RA. Encourage IS and ambulation.    GI: +BM. Tolerating a diet. Some nausea this AM resolved with Zofran    Endo: Preop A1C 6.0, likely prediabetic. Not on home meds. SSI and CBGs have been discontinued. Patient will need close outpatient follow up with PCP.    Renal: AKI resolved, Cr 1.1 this AM. No UO recorded. +5lbs from preop. Will discuss diuresis with surgeon. K 4.1, at goal. Mg 2.0, at goal.   Expected postop ABLA: Last H/H 9.1/27.7. Likely reactive thrombocytopenia improved, plt 148,000. Not clinically significant at this time.    DVT Prophylaxis: Lovenox    Deconditioning: PT/OT recommended home health PT. Continue work with PT/OT.   Wound: Patient complaining of itching and swelling at the superior portion of sternal incision. Minor erythema and swelling noted, no  drainage or sign of infection.    Dispo: May benefit from further diuresis now that his AKI has resolved. D/C today vs tomorrow, will discuss with surgeon.    LOS: 6 days    Randa Burton, PA-C 06/14/2023   Chart reviewed, patient examined, agree with above.  He feels well. Diuresing today. He had small BM.  Rhythm remains sinus with some bigeminy and brief SVT. Will plan home tomorrow if no changes.

## 2023-06-14 NOTE — Progress Notes (Signed)
 Mobility Specialist Progress Note:    06/14/23 1120  Mobility  Activity Ambulated with assistance in room;Ambulated with assistance in hallway  Level of Assistance Minimal assist, patient does 75% or more  Assistive Device Front wheel walker  Distance Ambulated (ft) 600 ft  RUE Weight Bearing Per Provider Order NWB  LUE Weight Bearing Per Provider Order NWB  Activity Response Tolerated well  Mobility Referral Yes  Mobility visit 1 Mobility  Mobility Specialist Start Time (ACUTE ONLY) 1120  Mobility Specialist Stop Time (ACUTE ONLY) 1127  Mobility Specialist Time Calculation (min) (ACUTE ONLY) 7 min   Pt received danging on EOB, requested assistance to ambulate in hallway after ambulating to/from bathroom with MinA and RW. Tolerated well, took 3 standing rest breaks d/t fatigue. Returned pt to room, eager for d/c. Left with all needs met, call bell in reach.    Dnasia Gauna Mobility Specialist Please contact via Special educational needs teacher or  Rehab office at [phone]

## 2023-06-14 NOTE — Plan of Care (Signed)

## 2023-06-14 NOTE — TOC Transition Note (Signed)
 Transition of Care [facility]) - Discharge Note Sherin Dingwall RN, BSN Transitions of Care Unit 4E- RN Case Manager See Treatment Team for direct phone #   Patient Details  Name: Tim Walters MRN: [id number] Date of Birth: 07/17/1945  Transition of Care [facility]) CM/SW Contact:  Rox Cope, RN Phone Number: 06/14/2023, 12:27 PM   Clinical Narrative:    Per morning progression rounds pt for potential discharge later today. HH and DME orders placed.   CM in to speak with pt at beside.  Discussed DME and HH needs. Per pt he has decided he does not need BSC- has high toilets at home and feels he will not need it- does want RW for home. No preference on DME agency.   List provided for [facility] choice Per CMS guidelines from [employer] website with star ratings (copy placed in shadow chart)- pt voiced he also does not have preference for [facility] provider.   Wife to transport home,  Address phone # and PCP all confirmed.   Call made to [employer] liaison for DME needs- per [employer] uses third party [employer] for DME approval- [facility] will have RW delivered to home once approval received. Pt informed and voiced understanding.   Call made to [facility] for [facility] referral- referral has been accepted for HHPT needs. [facility] to call and schedule start of care visit within 48 hr of discharge.   No further TOC needs noted.     Final next level of care: Home w Home Health Services Barriers to Discharge: Continued Medical Work up   Patient Goals and CMS Choice Patient states their goals for this hospitalization and ongoing recovery are:: plan to return home once stable          Discharge Placement               Home w/ [facility]        Discharge Plan and Services Additional resources added to the After Visit Summary for   In-house Referral: NA Discharge Planning Services: CM Consult Post Acute Care Choice: Home Health          DME Arranged: Walker rolling DME Agency: [facility] Date DME Agency Contacted: 06/14/23 Time DME Agency Contacted: 1140 Representative spoke with at DME Agency: Royston Cornea HH Arranged: PT HH Agency: [facility] Date [facility] Agency Contacted: 06/14/23 Time HH Agency Contacted: 1227 Representative spoke with at [facility] Agency: Amy  Social Drivers of Health (SDOH) Interventions SDOH Screenings   Food Insecurity: Patient Unable To Answer (06/08/2023)  Housing: Low Risk  (06/11/2023)  Transportation Needs: Patient Unable To Answer (06/08/2023)  Utilities: Patient Unable To Answer (06/08/2023)  Alcohol Screen: Low Risk  (05/16/2023)  Depression (PHQ2-9): Low Risk  (05/16/2023)  Financial Resource Strain: Low Risk  (05/16/2023)  Physical Activity: Insufficiently Active (05/16/2023)  Social Connections: Moderately Isolated (06/11/2023)  Stress: Stress Concern Present (05/16/2023)  Tobacco Use: Medium Risk (06/08/2023)     Readmission Risk Interventions    06/14/2023   12:27 PM  Readmission Risk Prevention Plan  Post Dischage Appt Complete  Medication Screening Complete  Transportation Screening Complete

## 2023-06-14 NOTE — Care Management Important Message (Signed)
 Important Message  Patient Details  Name: Tim Walters MRN: [id number] Date of Birth: 1945/02/25   Important Message Given:  Yes - Medicare IM     Janith Melnick 06/14/2023, 11:46 AM

## 2023-06-15 ENCOUNTER — Other Ambulatory Visit (HOSPITAL_COMMUNITY): Payer: Self-pay

## 2023-06-15 LAB — BASIC METABOLIC PANEL WITH GFR
Anion gap: 8 (ref 5–15)
BUN: 31 mg/dL — ABNORMAL HIGH (ref 8–23)
CO2: 27 mmol/L (ref 22–32)
Calcium: 8.6 mg/dL — ABNORMAL LOW (ref 8.9–10.3)
Chloride: 100 mmol/L (ref 98–111)
Creatinine, Ser: 1.09 mg/dL (ref 0.61–1.24)
GFR, Estimated: >60 mL/min (ref >60)
Glucose, Bld: 137 mg/dL — ABNORMAL HIGH (ref 70–99)
Potassium: 4 mmol/L (ref 3.5–5.1)
Sodium: 135 mmol/L (ref 135–145)

## 2023-06-15 MED ORDER — ASPIRIN 325 MG PO TBEC: 325.0000 mg | DELAYED_RELEASE_TABLET | Freq: Every day | ORAL | Status: DC

## 2023-06-15 MED ORDER — RAMIPRIL 5 MG PO CAPS
5.0000 mg | ORAL_CAPSULE | Freq: Every day | ORAL | 1 refills | Status: DC
Start: 2023-06-15 — End: 2023-07-01
  Filled 2023-06-15: qty 30, 30d supply, fill #0

## 2023-06-15 MED ORDER — METOPROLOL TARTRATE 50 MG PO TABS
75.0000 mg | ORAL_TABLET | Freq: Two times a day (BID) | ORAL | 1 refills | Status: DC
  Filled 2023-06-15: qty 90, 30d supply, fill #0

## 2023-06-15 MED ORDER — POTASSIUM CHLORIDE CRYS ER 20 MEQ PO TBCR
20.0000 meq | EXTENDED_RELEASE_TABLET | Freq: Every day | ORAL | 0 refills | Status: DC
  Filled 2023-06-15: qty 7, 7d supply, fill #0

## 2023-06-15 MED ORDER — TRAMADOL HCL 50 MG PO TABS
50.0000 mg | ORAL_TABLET | Freq: Four times a day (QID) | ORAL | 0 refills | Status: DC | PRN
  Filled 2023-06-15: qty 28, 7d supply, fill #0

## 2023-06-15 MED ORDER — TORSEMIDE 20 MG PO TABS
20.0000 mg | ORAL_TABLET | Freq: Every day | ORAL | Status: DC
Start: 2023-06-15 — End: 2023-06-15
  Administered 2023-06-15: 20 mg via ORAL
  Filled 2023-06-15: qty 1

## 2023-06-15 MED ORDER — ACETAMINOPHEN 325 MG PO TABS: 650.0000 mg | ORAL_TABLET | Freq: Four times a day (QID) | ORAL | Status: AC | PRN

## 2023-06-15 MED ORDER — TORSEMIDE 20 MG PO TABS
ORAL_TABLET | ORAL | 0 refills | Status: DC
  Filled 2023-06-15: qty 7, 7d supply, fill #0

## 2023-06-15 NOTE — Progress Notes (Signed)
 Physical Therapy Treatment Patient Details Name: Tim Walters MRN: [id number] DOB: Jun 24, 1945 Today's Date: 06/15/2023   History of Present Illness 78 year old gentleman adm 06/08/23 with a history of hypertension, hyperlipidemia, stage II chronic kidney disease, prediabetes, OSA, and coronary artery disease status post RCA stenting in the past who underwent CABGx4.    PT Comments  Pt up in chair on arrival and agreeable to session with good progress towards acute goals. Session focused on bed mobility training with adherence to sternal precautions. Pt able to demonstrate sit<>stand transfers with supervision for safety from various height surfaces with safe hand placement. Pt able to demonstrate rolling R/L and sidelying<>sit x2 with HOB flat and bedrails down to simulate home setup with supervision for safety and adherence to all precautions throughout. Increased time spent educating pt on importance of adherence to precautions with all mobility, appropriate activity progression and continued RW use for safety and to prevent falls. Pt continues to benefit from skilled PT services to progress toward functional mobility goals.      If plan is discharge home, recommend the following: A little help with walking and/or transfers;Assistance with cooking/housework;Help with stairs or ramp for entrance   Can travel by private vehicle        Equipment Recommendations  Rolling walker (2 wheels)    Recommendations for Other Services       Precautions / Restrictions Precautions Precautions: Fall Recall of Precautions/Restrictions: Intact Precaution/Restrictions Comments: sternal, watch HR and BP. Restrictions Weight Bearing Restrictions Per Provider Order: No Other Position/Activity Restrictions: sternal     Mobility  Bed Mobility Overal bed mobility: Needs Assistance Bed Mobility: Rolling, Sidelying to Sit, Sit to Sidelying Rolling: Supervision Sidelying to sit: Supervision      Sit to sidelying: Supervision General bed mobility comments: sueprvision for safety, good demo of rolling R and L, and coming to sit up EOB with supervision for safety, able to perform x3 from flat bed without rail use    Transfers Overall transfer level: Needs assistance Equipment used: None Transfers: Sit to/from Stand Sit to Stand: Supervision           General transfer comment: supervision for safety    Ambulation/Gait Ambulation/Gait assistance: Supervision Gait Distance (Feet): 18 Feet (x2) Assistive device: None Gait Pattern/deviations: Step-through pattern, Decreased stride length       General Gait Details: ambultaing in room without AD, reaching for rail of bed as needed to self steady, hallway gait deferred as pt waiting for phlebotomy for stat lab draw and focus of session on bed mobiltiy,   Stairs             Wheelchair Mobility     Tilt Bed    Modified Rankin (Stroke Patients Only)       Balance Overall balance assessment: Needs assistance Sitting-balance support: Feet supported Sitting balance-Leahy Scale: Good     Standing balance support: Single extremity supported, During functional activity Standing balance-Leahy Scale: Fair Standing balance comment: benefits from at least single UE support during dynamic tasks                            Communication Communication Communication: No apparent difficulties  Cognition Arousal: Alert Behavior During Therapy: WFL for tasks assessed/performed   PT - Cognitive impairments: No apparent impairments                         Following commands: Intact  Cueing Cueing Techniques: Verbal cues, Gestural cues  Exercises      General Comments General comments (skin integrity, edema, etc.): Continued education on appropriate activity reccomendations and bed mobility with pt able to recall all precatuions and demonstrating good adherence throughout all mobility       Pertinent Vitals/Pain Pain Assessment Pain Assessment: Faces Faces Pain Scale: Hurts a little bit Pain Location: chest incision with movement Pain Descriptors / Indicators: Aching, Sore, Tender Pain Intervention(s): Monitored during session, Limited activity within patient's tolerance    Home Living                          Prior Function            PT Goals (current goals can now be found in the care plan section) Acute Rehab PT Goals Patient Stated Goal: return home PT Goal Formulation: With patient Time For Goal Achievement: 06/27/23 Progress towards PT goals: Progressing toward goals    Frequency    Min 2X/week      PT Plan      Co-evaluation              AM-PAC PT "6 Clicks" Mobility   Outcome Measure  Help needed turning from your back to your side while in a flat bed without using bedrails?: A Little Help needed moving from lying on your back to sitting on the side of a flat bed without using bedrails?: A Little Help needed moving to and from a bed to a chair (including a wheelchair)?: A Little Help needed standing up from a chair using your arms (e.g., wheelchair or bedside chair)?: A Little Help needed to walk in hospital room?: A Little Help needed climbing 3-5 steps with a railing? : A Little 6 Click Score: 18    End of Session   Activity Tolerance: Patient tolerated treatment well Patient left: in chair;with call bell/phone within reach Nurse Communication: Mobility status PT Visit Diagnosis: Other abnormalities of gait and mobility (R26.89)     Time: 1308-6578 PT Time Calculation (min) (ACUTE ONLY): 9 min  Charges:    $Therapeutic Activity: 8-22 mins PT General Charges $$ ACUTE PT VISIT: 1 Visit                     Tim Walters R. PTA Acute Rehabilitation Services Office: [phone]   Agapito Horseman 06/15/2023, 9:39 AM

## 2023-06-15 NOTE — Progress Notes (Signed)
 Awaiting lab results to DC

## 2023-06-15 NOTE — Progress Notes (Signed)
 CARDIAC REHAB PHASE I    Pt ready for discharge home. Postop OHS education completed. No questions or concerns. Referral for CRP2 sent to [facility].   [id number] Ronny Colas, RN BSN 06/15/2023 9:49 AM

## 2023-06-15 NOTE — Progress Notes (Addendum)
 [address]       [place] [address]             [phone]      7 Days Post-Op Procedure(s) (LRB): CORONARY ARTERY BYPASS GRAFTING X FOUR, USING LEFT INTERNAL MAMMARY ARTERY AND ENDOSCOPICALLY HARVESTED RIGHT GREATER SAPHENOUS VEIN GRAFT (N/A) ECHOCARDIOGRAM, TRANSESOPHAGEAL, INTRAOPERATIVE (N/A) Subjective: Patient with no new complaints. Felt constipated and nauseous yesterday until he had 3 bowel movements.   Objective: Vital signs in last 24 hours: Temp:  [97.5 F (36.4 C)-98.4 F (36.9 C)] 98.3 F (36.8 C) (04/30 0005) Pulse Rate:  [70-86] 72 (04/30 0005) Cardiac Rhythm: Normal sinus rhythm (04/29 2010) Resp:  [15-20] 18 (04/30 0500) BP: (117-149)/(54-89) 122/74 (04/30 0005) SpO2:  [95 %-98 %] 98 % (04/30 0005) Weight:  [97.7 kg] 97.7 kg (04/30 0500)  Hemodynamic parameters for last 24 hours:    Intake/Output from previous day: 04/29 0701 - 04/30 0700 In: 270 [P.O.:270] Out: 400 [Urine:400] Intake/Output this shift: No intake/output data recorded.  General appearance: alert, cooperative, and no distress Neurologic: intact Heart: regular rate and rhythm with PVCs, no murmur Lungs: clear to auscultation bilaterally Abdomen: soft, non-tender; bowel sounds normal; no masses,  no organomegaly Extremities: edema 1+ BLE Wound: Clean and dry without sign of infection, slight erythema at superior portion of sternal incision, no drainage or sign of infection  Lab Results: Recent Labs    06/13/23 0335  WBC 6.7  HGB 9.1*  HCT 27.7*  PLT 148*   BMET:  Recent Labs    06/13/23 0335 06/14/23 0327  NA 138 139  K 3.7 4.1  CL 102 105  CO2 26 26  GLUCOSE 121* 113*  BUN 38* 37*  CREATININE 1.32* 1.10  CALCIUM  8.4* 8.3*    PT/INR: No results for input(s): "LABPROT", "INR" in the last 72 hours. ABG    Component Value Date/Time   PHART 7.319 (L) 06/08/2023 1804   HCO3 21.0 06/08/2023 1804   TCO2 22 06/08/2023 1804   ACIDBASEDEF 5.0 (H)  06/08/2023 1804   O2SAT 94 06/08/2023 1804   CBG (last 3)  No results for input(s): "GLUCAP" in the last 72 hours.  Assessment/Plan: S/P Procedure(s) (LRB): CORONARY ARTERY BYPASS GRAFTING X FOUR, USING LEFT INTERNAL MAMMARY ARTERY AND ENDOSCOPICALLY HARVESTED RIGHT GREATER SAPHENOUS VEIN GRAFT (N/A) ECHOCARDIOGRAM, TRANSESOPHAGEAL, INTRAOPERATIVE (N/A)  CV: BP overall controlled on Lopressor  75mg  BID and Ramipril  5mg  daily. NSR with often PVCs/ventricular bigeminy, one 8 beat run of asymptomatic SVT yesterday. Will continue Lopressor , no Amiodarone at this time as discussed with Dr. Sherene Dilling.    Pulm: Saturating well on RA. Encourage IS and ambulation.    GI: +BM. Tolerating a diet.    Endo: Preop A1C 6.0, likely prediabetic. Not on home meds. SSI and CBGs have been discontinued. Patient will need close outpatient follow up with PCP. He states he knows how to get it down and has done it before mainly with exercise but he has not been able to do that in the last year due to his symptoms.   Renal: AKI resolved, Cr 1.1 yesterday. 400cc/24 UO recorded. +5lbs from preop, unchanged last 3 days even with Lasix  yesterday. Will get bmet this AM and if creatinine is stable will d/c on diuresis.    DVT Prophylaxis: Lovenox    Deconditioning: PT/OT recommended home health PT. Continue work with PT/OT.    Wound: Patient complaining of itching and swelling at the superior portion of sternal incision. Minor  erythema and swelling noted, no drainage or sign of infection. Likely due to irritation from dressing. Patient will contact us  if this worsens.    Dispo: Plan for d/c home today.    LOS: 7 days    Randa Burton, PA-C 06/15/2023   Chart reviewed, patient examined, agree with above.  He feels well and rhythm is stable sinus with some bigeminy. Should be fine to go home. Will check BMET prior to DC.

## 2023-06-16 ENCOUNTER — Telehealth: Payer: Self-pay

## 2023-06-16 ENCOUNTER — Encounter: Payer: Self-pay | Admitting: Surgery

## 2023-06-16 NOTE — Transitions of Care (Post Inpatient/ED Visit) (Signed)
 06/16/2023  Name: Tim Walters MRN: [id number] DOB: [birth year]  Today's TOC FU Call Status: Today's TOC FU Call Status:: Successful TOC FU Call Completed TOC FU Call Complete Date: 06/16/23 Patient's Name and Date of Birth confirmed.  Transition Care Management Follow-up Telephone Call How have you been since you were released from the hospital?: Same (patient states his chest pain is now incisional /surgical) Any questions or concerns?: No  Items Reviewed: Did you receive and understand the discharge instructions provided?: Yes Medications obtained,verified, and reconciled?: Yes (Medications Reviewed) Any new allergies since your discharge?: No Dietary orders reviewed?: Yes Type of Diet Ordered:: low sodium heart healthy Do you have support at home?: Yes People in Home [RPT]: spouse Name of Support/Comfort Primary Source: Wife lives in home and adult son is visiting from [place]  and daughter lives close  Medications Reviewed Today: Medications Reviewed Today     Reviewed by Sharmaine Dearth, RN (Registered Nurse) on 06/16/23 at 1331  Med List Status: <None>   Medication Order Taking? Sig Documenting Provider Last Dose Status Informant  acetaminophen  (TYLENOL ) 325 MG tablet [id number] Yes Take 2 tablets (650 mg total) by mouth every 6 (six) hours as needed. Randa Burton, PA-C Taking Active   aspirin  EC 325 MG tablet [id number] Yes Take 1 tablet (325 mg total) by mouth daily. Randa Burton, PA-C Taking Active   atorvastatin  (LIPITOR ) 80 MG tablet [id number] Yes TAKE 1 TABLET BY MOUTH ONCE  DAILY Eubanks, Jessica K, NP Taking Active Self  ezetimibe  (ZETIA ) 10 MG tablet [id number] Yes TAKE 1 TABLET BY MOUTH DAILY Thukkani, Arun K, MD Taking Active Self  metoprolol  tartrate (LOPRESSOR ) 50 MG tablet [id number] Yes Take 1.5 tablets (75 mg total) by mouth 2 (two) times daily. Randa Burton, PA-C Taking Active   Multiple Vitamin (MULTIVITAMIN WITH MINERALS) TABS tablet  [id number] Yes Take 1 tablet by mouth daily. [provider] Taking Active Self  omeprazole (PRILOSEC) 20 MG capsule [id number] Yes Take 20 mg by mouth daily. [provider] Taking Active Self  potassium chloride  SA (KLOR-CON  M) 20 MEQ tablet [id number] Yes Take 1 tablet (20 mEq total) by mouth daily. For 7 days or until you stop taking Demadex  Nadean August, Barba Bonnet, PA-C Taking Active   ramipril  (ALTACE ) 5 MG capsule [id number] Yes Take 1 capsule (5 mg total) by mouth daily. Randa Burton, PA-C Taking Active   sertraline  (ZOLOFT ) 50 MG tablet [id number] Yes TAKE 1 TABLET BY MOUTH DAILY Eubanks, Jessica K, NP Taking Active Self  torsemide  (DEMADEX ) 20 MG tablet [id number] Yes Take 1 tablet (20mg ) daily for 7 days OR until you reach your preoperative weight of 210lbs and swelling has resolved Randa Burton, PA-C Taking Active   traMADol  (ULTRAM ) 50 MG tablet [id number] Yes Take 1 tablet (50 mg total) by mouth every 6 (six) hours as needed for moderate pain (pain score 4-6) or severe pain (pain score 7-10). Randa Burton, PA-C Taking Active             Home Care and Equipment/Supplies: Were Home Health Services Ordered?: Yes Name of Home Health Agency:: [facility] PT Has Agency set up a time to come to your home?: Yes First Home Health Visit Date: 06/17/23 Any new equipment or medical supplies ordered?: Yes Name of Medical supply agency?: [facility] Were you able to get the equipment/medical supplies?: No Do you have any questions related to the use of the equipment/supplies?: No  Functional Questionnaire: Do you need  assistance with bathing/showering or dressing?: No Do you need assistance with meal preparation?: No Do you need assistance with eating?: No Do you have difficulty maintaining continence: No Do you need assistance with getting out of bed/getting out of a chair/moving?: No Do you have difficulty managing or taking your medications?: No  Follow up  appointments reviewed: PCP Follow-up appointment confirmed?: Yes Date of PCP follow-up appointment?: 07/04/23 (scheduled by Care Guide) Follow-up Provider: PCP, Verma Gobble, NP Specialist Hospital Follow-up appointment confirmed?: Yes Date of Specialist follow-up appointment?: 06/20/23 Follow-Up Specialty Provider:: Cardio surgeon, Dr Linder Revere Do you need transportation to your follow-up appointment?: No Do you understand care options if your condition(s) worsen?: Yes-patient verbalized understanding  SDOH Interventions Today    Flowsheet Row Most Recent Value  SDOH Interventions   Food Insecurity Interventions Intervention Not Indicated  Housing Interventions Intervention Not Indicated  Transportation Interventions Intervention Not Indicated  Utilities Interventions Intervention Not Indicated       Goals Addressed             This Visit's Progress    VBCI Transitions of Care (TOC) Care Plan       Problems:  Recent Hospitalization for treatment of CABGx4 06/08/23 Patient reporting itching where adhesive had been applied  Goal:  Over the next 30 days, the patient will not experience hospital readmission  Interventions:  Transitions of Care: Doctor Visits  - discussed the importance of doctor visits Arranged PCP follow-up within 12-14 days (Care Guide Scheduled) Reviewed Signs and symptoms of infection  CAD Interventions: Assessed understanding of CAD diagnosis Medications reviewed including medications utilized in CAD treatment plan Provided education on importance of blood pressure control in management of CAD Reviewed Importance of taking all medications as prescribed Reviewed Importance of attending all scheduled provider appointments Advised patient to discuss itching at site where adhesive had been applied with provider Screening for signs and symptoms of depression related to chronic disease state Assessed social determinant of health  barriers   Surgery (CABGx4 06/08/23): Evaluation of current treatment plan related to CABGx4 06/08/23 surgery reviewed post-operative instructions with patient/caregiver addressed questions about post - surgical incision care  Reviewed the following instructions:   1. You may shower, please wash incisions daily with soap and water and keep dry. If you wish to cover wounds with dressing you may do so but please keep clean and change daily. No tub baths or swimming until incisions have completely healed. If your incisions become red or develop any drainage please call our office at [phone] 2. No Driving until cleared by Dr. Everlina Hock office and you are no longer using narcotic pain medications 3. Monitor your weight daily.. Please use the same scale and weigh at same time... If you gain 5-10 lbs in 48 hours with associated lower extremity swelling, please contact our office at [phone] 4. Fever of 101.5 for at least 24 hours with no source, please contact our office at [phone] 5. Activity- up as tolerated, please walk at least 3 times per day. Avoid strenuous activity, no lifting, pushing, or pulling with your arms over 8-10 lbs for a minimum of 6 weeks 6. If any questions or concerns arise, please do not hesitate to contact our office at [phone]  Patient Self Care Activities:  Attend all scheduled provider appointments Call pharmacy for medication refills 3-7 days in advance of running out of medications Call provider office for new concerns or questions  Notify RN Care Manager of [facility] call rescheduling needs Participate  in Transition of Care Program/Attend TOC scheduled calls Take medications as prescribed   check blood pressure daily keep a blood pressure log take blood pressure log to all doctor appointments call doctor for signs and symptoms of high blood pressure Call or send message via portal regarding itching   Plan:  Telephone follow up appointment with care  management team member scheduled for:  06/24/23 The patient has been provided with contact information for the care management team and has been advised to call with any health related questions or concerns.         Tonia Frankel RN, CCM [place]  VBCI-Population Health RN Care Manager [phone]

## 2023-06-16 NOTE — Patient Instructions (Signed)
 Visit Information  Thank you for taking time to visit with me today. Please don't hesitate to contact me if I can be of assistance to you before our next scheduled telephone appointment.  Our next appointment is by telephone on 06/24/23 at 11am  Following is a copy of your care plan:   Goals Addressed             This Visit's Progress    VBCI Transitions of Care (TOC) Care Plan       Problems:  Recent Hospitalization for treatment of CABGx4 06/08/23 Patient reporting itching where adhesive had been applied  Goal:  Over the next 30 days, the patient will not experience hospital readmission  Interventions:  Transitions of Care: Doctor Visits  - discussed the importance of doctor visits Arranged PCP follow-up within 12-14 days (Care Guide Scheduled) Reviewed Signs and symptoms of infection  CAD Interventions: Assessed understanding of CAD diagnosis Medications reviewed including medications utilized in CAD treatment plan Provided education on importance of blood pressure control in management of CAD Reviewed Importance of taking all medications as prescribed Reviewed Importance of attending all scheduled provider appointments Advised patient to discuss itching at site where adhesive had been applied with provider Screening for signs and symptoms of depression related to chronic disease state Assessed social determinant of health barriers   Surgery (CABGx4 06/08/23): Evaluation of current treatment plan related to CABGx4 06/08/23 surgery reviewed post-operative instructions with patient/caregiver addressed questions about post - surgical incision care  Reviewed the following instructions:   1. You may shower, please wash incisions daily with soap and water and keep dry. If you wish to cover wounds with dressing you may do so but please keep clean and change daily. No tub baths or swimming until incisions have completely healed. If your incisions become red or develop any drainage  please call our office at [phone] 2. No Driving until cleared by Dr. Everlina Hock office and you are no longer using narcotic pain medications 3. Monitor your weight daily.. Please use the same scale and weigh at same time... If you gain 5-10 lbs in 48 hours with associated lower extremity swelling, please contact our office at [phone] 4. Fever of 101.5 for at least 24 hours with no source, please contact our office at [phone] 5. Activity- up as tolerated, please walk at least 3 times per day. Avoid strenuous activity, no lifting, pushing, or pulling with your arms over 8-10 lbs for a minimum of 6 weeks 6. If any questions or concerns arise, please do not hesitate to contact our office at [phone]  Patient Self Care Activities:  Attend all scheduled provider appointments Call pharmacy for medication refills 3-7 days in advance of running out of medications Call provider office for new concerns or questions  Notify RN Care Manager of [facility] call rescheduling needs Participate in Transition of Care Program/Attend TOC scheduled calls Take medications as prescribed   check blood pressure daily keep a blood pressure log take blood pressure log to all doctor appointments call doctor for signs and symptoms of high blood pressure Call or send message via portal regarding itching   Plan:  Telephone follow up appointment with care management team member scheduled for:  06/24/23 The patient has been provided with contact information for the care management team and has been advised to call with any health related questions or concerns.         Patient verbalizes understanding of instructions and care plan provided today and agrees to  view in MyChart. Active MyChart status and patient understanding of how to access instructions and care plan via MyChart confirmed with patient.     Telephone follow up appointment with care management team member scheduled for:06/24/23 11am The patient has  been provided with contact information for the care management team and has been advised to call with any health related questions or concerns.   Please call the care guide team at [phone] if you need to cancel or reschedule your appointment.   Please call the Suicide and Crisis Lifeline: 988 call 1-800-273-TALK (toll free, 24 hour hotline) call 911 if you are experiencing a Mental Health or Behavioral Health Crisis or need someone to talk to.  Tonia Frankel RN, CCM Reform  VBCI-Population Health RN Care Manager [phone]

## 2023-06-16 NOTE — Transitions of Care (Post Inpatient/ED Visit) (Signed)
   06/16/2023  Name: Tim Walters MRN: [id number] DOB: [birth year]  Today's TOC FU Call Status: Today's TOC FU Call Status:: Unsuccessful Call (1st Attempt) Unsuccessful Call (1st Attempt) Date: 06/16/23  Attempted to reach the patient regarding the most recent Inpatient/ED visit.  Follow Up Plan: Additional outreach attempts will be made to reach the patient to complete the Transitions of Care (Post Inpatient/ED visit) call.   .my

## 2023-06-17 ENCOUNTER — Ambulatory Visit: Admitting: Internal Medicine

## 2023-06-17 ENCOUNTER — Telehealth: Payer: Self-pay | Admitting: Physician Assistant

## 2023-06-17 MED ORDER — METHYLPREDNISOLONE 4 MG PO TBPK: ORAL_TABLET | ORAL | 0 refills | Status: DC

## 2023-06-17 NOTE — Telephone Encounter (Signed)
      [address]       [name] [address]             [phone]        Tim Walters reached out to the office pool via chart message.  He is experiencing an extensive rash across his torso neck, and face.  He feels this is due to Demadex .  He was instructed by our nurses to try Hydrocortisone and Benadryl.  This has not provided relief.  Plan: discussed/confirmed with Tim Walters   Stop Demadex  and Ramipril   Continue Benadryl, Hydrocortisone cream as needed   Medrol Dose Pack sent to [facility] on [facility]   [place], [place] 8:15 AM 06/17/23

## 2023-06-20 ENCOUNTER — Telehealth (HOSPITAL_COMMUNITY): Payer: Self-pay

## 2023-06-20 ENCOUNTER — Ambulatory Visit: Attending: Surgery

## 2023-06-20 DIAGNOSIS — Z4802 Encounter for removal of sutures: Secondary | ICD-10-CM

## 2023-06-20 NOTE — Telephone Encounter (Signed)
 Pt insurance is active and benefits verified through [facility] Medicare. Co-pay $0.00, DED $0.00/$0.00 met, out of pocket $3,900.00/$93.12 met, co-insurance 0%. No pre-authorization required. Passport, 06/20/23 @ 10:58AM, REF#[id number]   How many CR sessions are covered? (36 visits for TCR, 72 visits for ICR)72 Is this a lifetime maximum or an annual maximum? Annual Has the member used any of these services to date? No Is there a time limit (weeks/months) on start of program and/or program completion? No     Will contact patient to see if he is interested in the Cardiac Rehab Program. If interested, patient will need to complete follow up appt. Once completed, patient will be contacted for scheduling upon review by the RN Navigator.

## 2023-06-20 NOTE — Telephone Encounter (Signed)
 Called patient to see if he is interested in the Cardiac Rehab Program. Patient expressed interest. Explained scheduling process and went over insurance, patient verbalized understanding. Will contact patient for scheduling once f/u has been completed.

## 2023-06-21 ENCOUNTER — Ambulatory Visit (INDEPENDENT_AMBULATORY_CARE_PROVIDER_SITE_OTHER): Admitting: Sports Medicine

## 2023-06-21 ENCOUNTER — Encounter: Payer: Self-pay | Admitting: Sports Medicine

## 2023-06-21 ENCOUNTER — Encounter: Payer: Self-pay | Admitting: Surgery

## 2023-06-21 ENCOUNTER — Encounter: Payer: Self-pay | Admitting: Nurse Practitioner

## 2023-06-21 ENCOUNTER — Telehealth: Payer: Self-pay

## 2023-06-21 ENCOUNTER — Ambulatory Visit: Payer: Self-pay

## 2023-06-21 VITALS — BP 124/68 | HR 63 | Temp 98.2°F | Ht 67.5 in | Wt 208.6 lb

## 2023-06-21 DIAGNOSIS — L270 Generalized skin eruption due to drugs and medicaments taken internally: Secondary | ICD-10-CM | POA: Diagnosis not present

## 2023-06-21 MED ORDER — METHYLPREDNISOLONE ACETATE 40 MG/ML IJ SUSP
40.0000 mg | Freq: Once | INTRAMUSCULAR | Status: AC
  Administered 2023-06-21: 40 mg via INTRAMUSCULAR

## 2023-06-21 MED ORDER — PREDNISONE 20 MG PO TABS
20.0000 mg | ORAL_TABLET | Freq: Every day | ORAL | 0 refills | Status: DC
Start: 2023-06-21 — End: 2023-07-04

## 2023-06-21 NOTE — Telephone Encounter (Signed)
 Message routed to Dr.V as Arlie Lain. Patient added to schedule for today.

## 2023-06-21 NOTE — Telephone Encounter (Signed)
Message routed to PCP Eubanks, Jessica K, NP  

## 2023-06-21 NOTE — Telephone Encounter (Signed)
 Reason for Disposition . SEVERE itching (i.e., interferes with sleep, normal activities or school)  Protocols used: Rash or Redness - [facility]

## 2023-06-21 NOTE — Progress Notes (Unsigned)
 Careteam: Patient Care Team: Verma Gobble, NP as PCP - General (Geriatric Medicine) Kyra Phy, MD as PCP - Cardiology (Cardiology) Boyce Byes, MD as Consulting Physician (General Surgery) Osa Blase, MD as Consulting Physician (Orthopedic Surgery) Harlen Lick, MD as Consulting Physician (Dermatology) Brian Campanile, MD (Inactive) as Consulting Physician (Neurology) Waylon Hahn, OD (Optometry) [facility] [place] Kyra Phy, MD as Consulting Physician (Cardiology) Sharmaine Dearth, RN as Registered Nurse  PLACE OF SERVICE:  [facility]  Advanced Directive information    No Known Allergies  Chief Complaint  Patient presents with   Rash    Think it a reaction from a medication that he was given in the hospital , he spoke with provider and told to d/c and given medrol dose on his 5th day , he said rash on his face chest and back     Discussed the use of AI scribe software for clinical note transcription with the patient, who gave verbal consent to proceed.  History of Present Illness   Tim Walters is a 78 year old male who presents with a severe burning and itchy rash following recent cardiac surgery.  He was discharged from the hospital last Wednesday after undergoing coronary artery bypass graft surgery. Post-discharge, he was prescribed Demadex , potassium, and ramipril , with a history of long-term ramipril  use. After taking Demadex  on Wednesday, Thursday, and Friday morning, he developed a severe burning and itchy rash on his torso, which has since spread to his neck, forehead, and back. The rash is described as 'almost unbearable' at night due to the itching, which disrupts his sleep.  The rash   has expanded to his sides and back, with the right side being the worst. Although the rash is not as bright red as initially, it continues to expand and remains intensely itchy. He denies  swelling of the lips or tongue, and no  difficulty breathing.  No chest pain, palpitations, dizziness, lightheadedness, nausea, vomiting, blood in urine or stool, abdominal cramping, or diarrhea since returning home.    He is currently taking a Medrol Dosepak, with one dose remaining. He has not taken any sulfur antibiotics or medications before. He accidentally took ramipril  once more before stopping. He can lay flat in bed but prefers sleeping on his right side. He does not have diabetes.  Review of Systems:  Review of Systems  Constitutional:  Negative for chills and fever.  HENT:  Negative for congestion and sore throat.   Eyes:  Negative for double vision.  Respiratory:  Negative for cough, sputum production and shortness of breath.   Cardiovascular:  Negative for chest pain, palpitations and leg swelling.  Gastrointestinal:  Negative for abdominal pain, heartburn and nausea.  Genitourinary:  Negative for dysuria, frequency and hematuria.  Musculoskeletal:  Negative for falls and myalgias.  Skin:  Positive for itching and rash.  Neurological:  Negative for dizziness.   Negative unless indicated in HPI.   Past Medical History:  Diagnosis Date   Anginal pain (HCC)    Arthritis    lt ankle   Basal cell carcinoma    x2 (excised)   Breast lump    Per records from [facility]    Colon polyp    Coronary artery disease    a. BMS to RCA 2003 with residual LAD/diag disease treated medically, normal EF.   COVID    Depression    Diverticulosis    Per records from [facility]  GERD (gastroesophageal reflux disease)    Hyperkalemia    a. K of 5.2 in 2017.   Hyperlipidemia    Hypertension    Idiopathic peripheral neuropathy    Per records from [facility]    Impaired fasting glucose    Per records from [facility]    Kidney stones    Monoclonal gammopathy of undetermined significance    Per records from [facility]    Myocardial infarction [facility]) 11/15/2001   Dr. Kay Parson [facility]  Cardiology)   OSA (obstructive sleep apnea)    Per [facility] New Patient Packet   Peripheral neuropathy    Left Foot, Per [facility] New Patient Packet   Pre-diabetes    Sleep apnea    had test several yr ago-said he did not need a cpap-still snores   Torn rotator cuff    Per [facility] New Patient Packet   Venous insufficiency of left leg    Per [facility] New Patient Packet   Wears glasses    Past Surgical History:  Procedure Laterality Date   ANGIOPLASTY  2003   Per records from [facility]    BASAL CELL CARCINOMA EXCISION     left tricep   BASAL CELL CARCINOMA EXCISION  11/10/2022   right shoulder   CARDIAC CATHETERIZATION  [address]   stent rca   CARDIOVASCULAR STRESS TEST  02/16/2016   Per records from [facility]    COLONOSCOPY  06/17/2003   Per records from [facility], Dr.Ganem to be repeated 2015   CORONARY ARTERY BYPASS GRAFT N/A 06/08/2023   Procedure: CORONARY ARTERY BYPASS GRAFTING X FOUR, USING LEFT INTERNAL MAMMARY ARTERY AND ENDOSCOPICALLY HARVESTED RIGHT GREATER SAPHENOUS VEIN GRAFT;  Surgeon: Bartley Lightning, MD;  Location: [facility] OR;  Service: Open Heart Surgery;  Laterality: N/A;   coronary artery stent  11/15/2001   CORONARY STENT PLACEMENT  11/15/2001   Per records from [facility]    CYST REMOVAL TRUNK Left 05/08/2013   Procedure: CYST REMOVAL BACK;  Surgeon: Levert Ready, MD;  Location: [facility];  Service: General;  Laterality: Left;   INGUINAL HERNIA REPAIR Left 03/24/2015   Procedure: OPEN REPAIR LEFT INGUINAL HERNIA ;  Surgeon: Boyce Byes, MD;  Location: [facility];  Service: General;  Laterality: Left;   INSERTION OF MESH Left 03/24/2015   Procedure: INSERTION OF MESH;  Surgeon: Boyce Byes, MD;  Location: [facility];  Service: General;  Laterality: Left;   INTRAOPERATIVE TRANSESOPHAGEAL ECHOCARDIOGRAM N/A 06/08/2023   Procedure: ECHOCARDIOGRAM, TRANSESOPHAGEAL, INTRAOPERATIVE;  Surgeon: Bartley Lightning, MD;   Location: [facility] OR;  Service: Open Heart Surgery;  Laterality: N/A;   LEFT HEART CATH AND CORONARY ANGIOGRAPHY N/A 05/24/2023   Procedure: LEFT HEART CATH AND CORONARY ANGIOGRAPHY;  Surgeon: Swaziland, Peter M, MD;  Location: [facility] INVASIVE CV LAB;  Service: Cardiovascular;  Laterality: N/A;   SHOULDER ARTHROSCOPY W/ ROTATOR CUFF REPAIR  2011   right   SPINE SURGERY  02/12/1993   L2, L3 fragmented disc   VASECTOMY  1987   VEIN REPAIR  09/2018   Vein injections    Social History:   reports that he quit smoking about 49 years ago. His smoking use included cigarettes. He started smoking about 61 years ago. He has never used smokeless tobacco. He reports that he does not currently use alcohol after a past usage of about 21.0 standard drinks of alcohol per week. He reports that he does not use drugs.  Family History  Problem Relation Age of Onset   Macular degeneration Mother    COPD Mother    Non-Hodgkin's lymphoma Mother    Lung cancer Mother        Per [facility] New Patient Packet    Hypertension Father    Suicidality Father 27       Per [facility] New Patient Packet    Depression Father    Angina Father        Per records from [facility]    Diverticulosis Father        Per records from [facility]    Heart disease Father    High Cholesterol Sister    Macular degeneration Maternal Grandmother    Schizophrenia Son    Bipolar disorder Son    Autism Son    Post-traumatic stress disorder Son    Diabetes type II Daughter    Breast cancer Neg Hx    Colon cancer Neg Hx    Esophageal cancer Neg Hx    Stomach cancer Neg Hx    Liver disease Neg Hx    Pancreatic cancer Neg Hx     Medications: Patient's Medications  New Prescriptions   No medications on file  Previous Medications   ACETAMINOPHEN  (TYLENOL ) 325 MG TABLET    Take 2 tablets (650 mg total) by mouth every 6 (six) hours as needed.   ASPIRIN  EC 325 MG TABLET    Take 1 tablet (325 mg total) by mouth daily.   ATORVASTATIN  (LIPITOR )  80 MG TABLET    TAKE 1 TABLET BY MOUTH ONCE  DAILY   EZETIMIBE  (ZETIA ) 10 MG TABLET    TAKE 1 TABLET BY MOUTH DAILY   METHYLPREDNISOLONE (MEDROL DOSEPAK) 4 MG TBPK TABLET    Take as directed   METOPROLOL  TARTRATE (LOPRESSOR ) 50 MG TABLET    Take 1.5 tablets (75 mg total) by mouth 2 (two) times daily.   MULTIPLE VITAMIN (MULTIVITAMIN WITH MINERALS) TABS TABLET    Take 1 tablet by mouth daily.   OMEPRAZOLE (PRILOSEC) 20 MG CAPSULE    Take 20 mg by mouth daily.   POTASSIUM CHLORIDE  SA (KLOR-CON  M) 20 MEQ TABLET    Take 1 tablet (20 mEq total) by mouth daily. For 7 days or until you stop taking Demadex    RAMIPRIL  (ALTACE ) 5 MG CAPSULE    Take 1 capsule (5 mg total) by mouth daily.   SERTRALINE  (ZOLOFT ) 50 MG TABLET    TAKE 1 TABLET BY MOUTH DAILY   TORSEMIDE  (DEMADEX ) 20 MG TABLET    Take 1 tablet (20mg ) daily for 7 days OR until you reach your preoperative weight of 210lbs and swelling has resolved  Modified Medications   No medications on file  Discontinued Medications   TRAMADOL  (ULTRAM ) 50 MG TABLET    Take 1 tablet (50 mg total) by mouth every 6 (six) hours as needed for moderate pain (pain score 4-6) or severe pain (pain score 7-10).    Physical Exam: Vitals:   06/21/23 1354  BP: 124/68  Pulse: 63  Temp: 98.2 F (36.8 C)  TempSrc: Temporal  SpO2: 99%  Weight: 208 lb 9.6 oz (94.6 kg)  Height: 5' 7.5" (1.715 m)   Body mass index is 32.19 kg/m. BP Readings from Last 3 Encounters:  06/21/23 124/68  06/15/23 117/63  06/07/23 (!) 148/81   Wt Readings from Last 3 Encounters:  06/21/23 208 lb 9.6 oz (94.6 kg)  06/16/23 213 lb 3.2 oz (96.7 kg)  06/15/23 215 lb 6.2  oz (97.7 kg)    Physical Exam Constitutional:      Appearance: Normal appearance.  HENT:     Head: Normocephalic and atraumatic.  Cardiovascular:     Rate and Rhythm: Normal rate and regular rhythm.     Pulses: Normal pulses.     Heart sounds: Normal heart sounds.  Pulmonary:     Effort: No respiratory distress.      Breath sounds: No stridor. No wheezing or rales.  Abdominal:     General: Bowel sounds are normal. There is no distension.     Palpations: Abdomen is soft.     Tenderness: There is no abdominal tenderness. There is no guarding.  Musculoskeletal:        General: No swelling.  Skin:    Comments: Extensive flat rash on his torso , back     Neurological:     Mental Status: He is alert. Mental status is at baseline.     Motor: No weakness.     Labs reviewed: Basic Metabolic Panel: Recent Labs    08/20/22 1603 11/09/22 0826 06/09/23 0408 06/09/23 1720 06/10/23 0413 06/13/23 0335 06/13/23 1013 06/14/23 0327 06/15/23 0912  NA  --    < > 133* 132*   < > 138  --  139 135  K  --    < > 4.4 4.6   < > 3.7  --  4.1 4.0  CL  --    < > 104 102   < > 102  --  105 100  CO2  --    < > 21* 21*   < > 26  --  26 27  GLUCOSE  --    < > 125* 158*   < > 121*  --  113* 137*  BUN  --    < > 21 26*   < > 38*  --  37* 31*  CREATININE  --    < > 1.10 1.74*   < > 1.32*  --  1.10 1.09  CALCIUM   --    < > 7.7* 7.8*   < > 8.4*  --  8.3* 8.6*  MG  --    < > 2.6* 2.5*  --   --  2.0  --   --   TSH 2.350  --   --   --   --   --   --   --   --    < > = values in this interval not displayed.   Liver Function Tests: Recent Labs    04/20/23 1103 05/17/23 0832 06/07/23 1130  AST 24 19 26   ALT 33 21 30  ALKPHOS 84  --  52  BILITOT 0.5 0.7 0.8  PROT 6.5 6.1 6.5  ALBUMIN  4.0  --  3.5   No results for input(s): "LIPASE", "AMYLASE" in the last 8760 hours. No results for input(s): "AMMONIA" in the last 8760 hours. CBC: Recent Labs    11/09/22 0826 05/17/23 0832 06/07/23 1130 06/09/23 1720 06/10/23 0413 06/13/23 0335  WBC 6.2 5.8   < > 11.9* 11.4* 6.7  NEUTROABS 3,962 4,002  --   --   --   --   HGB 14.3 13.1*   < > 11.1* 10.5* 9.1*  HCT 43.6 40.6   < > 35.1* 32.5* 27.7*  MCV 95.4 96.0   < > 98.0 99.4 94.5  PLT 185 178   < > 124* 119* 148*   < > = values in  this interval not displayed.    Lipid Panel: Recent Labs    10/26/22 1009 04/20/23 1100 05/17/23 0832  CHOL 114 106 108  HDL 63 54 61  LDLCALC 39 41 33  TRIG 51 41 49  CHOLHDL 1.8 2.0 1.8   TSH: Recent Labs    08/20/22 1603  TSH 2.350   A1C: Lab Results  Component Value Date   HGBA1C 6.0 (H) 06/07/2023    Assessment and Plan Assessment & Plan  1. Drug-induced skin rash (Primary)  Pt with rash on his upper back, torso Wife reports that rash is extending to his back  Will order labs cbc, bmp  Pt got solumedrol 40 mg in the clinic Will start prednisone 40 mg x 5 days  Take claritin for itching  Instructed patient to monitor for worsening rash  Follow up clinic if no improvement - CBC with Differential/Platelet - Basic Metabolic Panel with eGFR - predniSONE (DELTASONE) 20 MG tablet; Take 1 tablet (20 mg total) by mouth daily with breakfast.  Dispense: 5 tablet; Refill: 0 - methylPREDNISolone acetate (DEPO-MEDROL) injection 40 mg      No follow-ups on file.:   30 minTotal time spent for obtaining history,  performing a medically appropriate examination and evaluation, reviewing the tests, documenting clinical information in the electronic or other health record,  ,care coordination (not separately reported)   Verita Kuroda

## 2023-06-21 NOTE — Telephone Encounter (Signed)
 Charrise, PT with [facility] contacted the office to state patient is refusing in home PT. He states that he is moving in the home and wants to wait until he is able to do outpatient Cardiac Rehab at [facility]. Left VM for HHPT advised that patient will need to be cleared from a surgical standpoint before he is able to attend Cardiac Rehab.

## 2023-06-21 NOTE — Telephone Encounter (Addendum)
 Copied from CRM [id number]. Topic: Clinical - Red Word Triage >> Jun 21, 2023 11:56 AM Karole Pacer C wrote: Red Word that prompted transfer to Nurse Triage: Patient has developed a rash all over his body and he believes it maybe due to a medication he's taking. Patient states the rash is causing some burning and pain/discomfort.  Chief Complaint: rash Symptoms: widespread rash,red, raised, itchy-severe, bumps Frequency: 06/17/2023 Pertinent Negatives: Patient denies fever Disposition: [] ED /[] Urgent Care (no appt availability in office) / [x] Appointment(In office/virtual)/ []  [facility]/ [] Home Care/ [] Refused Recommended Disposition /[] [facility]/ []  Follow-up with PCP Additional Notes:  discharged Wednesday 06/15/2023 with new medication then on Friday develop rash - was given prednisone pack to help without success.  Transferred call to CAL w/Karen to attempt to schedule an appointment today in office.  Answer Assessment - Initial Assessment Questions 1. APPEARANCE of RASH: "Describe the rash." (e.g., spots, blisters, raised areas, skin peeling, scaly)     Raised, red, itchy, bumps, some areas swelling 2. SIZE: "How big are the spots?" (e.g., tip of pen, eraser, coin; inches, centimeters)     various 3. LOCATION: "Where is the rash located?"     Various locations throughout body  4. COLOR: "What color is the rash?" (Note: It is difficult to assess rash color in people with darker-colored skin. When this situation occurs, simply ask the caller to describe what they see.)     red 5. ONSET: "When did the rash begin?" 06/17/2023    6. FEVER: "Do you have a fever?" If Yes, ask: "What is your temperature, how was it measured, and when did it start?"     no 7. ITCHING: "Does the rash itch?" If Yes, ask: "How bad is the itch?" (Scale 1-10; or mild, moderate, severe)     severe 8. CAUSE: "What do you think is causing the rash?"     New medication 9. MEDICINE FACTORS: "Have you  started any new medicines within the last 2 weeks?" (e.g., antibiotics)      yes 10. OTHER SYMPTOMS: "Do you have any other symptoms?" (e.g., dizziness, headache, sore throat, joint pain)       no 11. PREGNANCY: "Is there any chance you are pregnant?" "When was your last menstrual period?"       no  Protocols used: Rash or Redness - [facility]

## 2023-06-22 LAB — CBC WITH DIFFERENTIAL/PLATELET
Absolute Lymphocytes: 964 {cells}/uL (ref 850–3900)
Absolute Monocytes: 1132 {cells}/uL — ABNORMAL HIGH (ref 200–950)
Basophils Absolute: 77 {cells}/uL (ref 0–200)
Basophils Relative: 0.5 %
Eosinophils Absolute: 1056 {cells}/uL — ABNORMAL HIGH (ref 15–500)
Eosinophils Relative: 6.9 %
HCT: 34.5 % — ABNORMAL LOW (ref 38.5–50.0)
Hemoglobin: 11.5 g/dL — ABNORMAL LOW (ref 13.2–17.1)
MCH: 31.9 pg (ref 27.0–33.0)
MCHC: 33.3 g/dL (ref 32.0–36.0)
MCV: 95.8 fL (ref 80.0–100.0)
MPV: 9.3 fL (ref 7.5–12.5)
Monocytes Relative: 7.4 %
Neutro Abs: 12072 {cells}/uL — ABNORMAL HIGH (ref 1500–7800)
Neutrophils Relative %: 78.9 %
Platelets: 426 10*3/uL — ABNORMAL HIGH (ref 140–400)
RBC: 3.6 10*6/uL — ABNORMAL LOW (ref 4.20–5.80)
RDW: 12.2 % (ref 11.0–15.0)
Total Lymphocyte: 6.3 %
WBC: 15.3 10*3/uL — ABNORMAL HIGH (ref 3.8–10.8)

## 2023-06-22 LAB — BASIC METABOLIC PANEL WITHOUT GFR
BUN/Creatinine Ratio: 29 (calc) — ABNORMAL HIGH (ref 6–22)
BUN: 29 mg/dL — ABNORMAL HIGH (ref 7–25)
CO2: 27 mmol/L (ref 20–32)
Calcium: 9 mg/dL (ref 8.6–10.3)
Chloride: 105 mmol/L (ref 98–110)
Creat: 1 mg/dL (ref 0.70–1.28)
Glucose, Bld: 94 mg/dL (ref 65–139)
Potassium: 4.5 mmol/L (ref 3.5–5.3)
Sodium: 140 mmol/L (ref 135–146)

## 2023-06-24 ENCOUNTER — Other Ambulatory Visit: Payer: Self-pay

## 2023-06-24 NOTE — Transitions of Care (Post Inpatient/ED Visit) (Signed)
 Transition of Care week 2  Visit Note  06/24/2023  Name: Tim Walters MRN: [id number]          DOB: Jun 27, 1945  Situation: Patient enrolled in [facility] 30-day program. Visit completed with patient by telephone.   Background: Patient discharged from [facility] 06/15/23 s/p CABGx4 06/08/23. Patient doing well except reports rash that he feels is related to new Demadex  and states this and Ramipril  are both on hold. Patient saw PCP 5/6 due to rash and was given solumedrol 40mg  injection and Prednisone  20mg  oral daily x5 after having taken Methylprednisolone  dose pack that was ordered by cardiosurgeon. Today patient states redness has improved but rash is still itchy. TOC RN communicated with PCP via chat and patient is going to try either Allegra or zyrtec as directed.   Initial Transition Care Management Follow-up Telephone Call    Past Medical History:  Diagnosis Date   Anginal pain (HCC)    Arthritis    lt ankle   Basal cell carcinoma    x2 (excised)   Breast lump    Per records from [facility]    Colon polyp    Coronary artery disease    a. BMS to RCA 2003 with residual LAD/diag disease treated medically, normal EF.   COVID    Depression    Diverticulosis    Per records from [facility]    GERD (gastroesophageal reflux disease)    Hyperkalemia    a. K of 5.2 in 2017.   Hyperlipidemia    Hypertension    Idiopathic peripheral neuropathy    Per records from [facility]    Impaired fasting glucose    Per records from [facility]    Kidney stones    Monoclonal gammopathy of undetermined significance    Per records from [facility]    Myocardial infarction [facility]) 11/15/2001   Dr. Kay Parson [facility])   OSA (obstructive sleep apnea)    Per [facility] New Patient Packet   Peripheral neuropathy    Left Foot, Per [facility] New Patient Packet   Pre-diabetes    Sleep apnea    had test several yr ago-said he did not need a cpap-still snores   Torn rotator  cuff    Per [facility] New Patient Packet   Venous insufficiency of left leg    Per [facility] New Patient Packet   Wears glasses     Assessment: Patient Reported Symptoms: Cognitive Cognitive Status: Alert and oriented to person, place, and time, Normal speech and language skills      Neurological Neurological Review of Symptoms: No symptoms reported    HEENT HEENT Symptoms Reported: No symptoms reported      Cardiovascular Cardiovascular Symptoms Reported: No symptoms reported Cardiovascular Conditions: Hypertension, Coronary artery disease Weight: 202 lb 12.8 oz (92 kg) Cardiovascular Comment: Patient reports BP around 120s/70s to 140s/80s  Respiratory Respiratory Symptoms Reported: No symptoms reported    Endocrine Patient reports the following symptoms related to hypoglycemia or hyperglycemia : No symptoms reported Endocrine Comment: Record reflects prediabetes - patient does not check sugar but states he does limit carbs  Gastrointestinal Gastrointestinal Symptoms Reported: No symptoms reported Gastrointestinal Comment: Patient states he continues to move his bowels without issue    Genitourinary Genitourinary Symptoms Reported: No symptoms reported    Integumentary Integumentary Symptoms Reported: Incision Additional Integumentary Details: Patient states sutures were removed 06/20/23 - patient reports rash on torso front and back and sides - has been assessed by PCP and  cardiosurgeon prescribed Methyprednisone dose pack, patient was then seen by PCP and received solumedrol 40mg  injection and Prednisone  20mg  daily x 5 days- reports it looks better but remains itchy. Skin Comment: Patient states he tried oral Benadryl for itching but made mouth dry and he stopped.  Musculoskeletal          Psychosocial           There were no vitals filed for this visit.  Medications Reviewed Today     Reviewed by Sharmaine Dearth, RN (Registered Nurse) on 06/24/23 at 1122  Med List Status: <None>    Medication Order Taking? Sig Documenting Provider Last Dose Status Informant  acetaminophen  (TYLENOL ) 325 MG tablet [id number] Yes Take 2 tablets (650 mg total) by mouth every 6 (six) hours as needed. Randa Burton, PA-C Taking Active   aspirin  EC 325 MG tablet [id number] Yes Take 1 tablet (325 mg total) by mouth daily. Randa Burton, PA-C Taking Active   atorvastatin  (LIPITOR ) 80 MG tablet [id number] Yes TAKE 1 TABLET BY MOUTH ONCE  DAILY Eubanks, Jessica K, NP Taking Active Self  ezetimibe  (ZETIA ) 10 MG tablet [id number] Yes TAKE 1 TABLET BY MOUTH DAILY Thukkani, Arun K, MD Taking Active Self  metoprolol  tartrate (LOPRESSOR ) 50 MG tablet [id number] Yes Take 1.5 tablets (75 mg total) by mouth 2 (two) times daily. Randa Burton, PA-C Taking Active   Multiple Vitamin (MULTIVITAMIN WITH MINERALS) TABS tablet [id number] No Take 1 tablet by mouth daily.  Patient not taking: Reported on 06/21/2023   [provider] Not Taking Active Self  omeprazole (PRILOSEC) 20 MG capsule [id number] Yes Take 20 mg by mouth daily. [provider] Taking Active Self  predniSONE  (DELTASONE ) 20 MG tablet [id number] Yes Take 1 tablet (20 mg total) by mouth daily with breakfast. Tye Gall, MD Taking Active   ramipril  (ALTACE ) 5 MG capsule [id number] No Take 1 capsule (5 mg total) by mouth daily.  Patient not taking: Reported on 06/24/2023   Randa Burton, PA-C Not Taking Active   sertraline  (ZOLOFT ) 50 MG tablet [id number] Yes TAKE 1 TABLET BY MOUTH DAILY Verma Gobble, NP Taking Active Self  torsemide  (DEMADEX ) 20 MG tablet [id number] No Take 1 tablet (20mg ) daily for 7 days OR until you reach your preoperative weight of 210lbs and swelling has resolved  Patient not taking: Reported on 06/24/2023   Randa Burton, PA-C Not Taking Active             Recommendation:   Specialty provider follow-up 07/01/23 with cardio  and will notify PCP if rash worsens Follow Up Plan:    Telephone follow up appointment date/time:  07/01/23 2pm  Tonia Frankel RN, CCM [place]  VBCI-Population Health RN Care Manager [phone]

## 2023-06-27 ENCOUNTER — Encounter (HOSPITAL_COMMUNITY): Payer: Self-pay

## 2023-06-27 ENCOUNTER — Encounter: Payer: Self-pay | Admitting: Sports Medicine

## 2023-06-27 NOTE — Telephone Encounter (Signed)
 Forwarded message to [place] due to Dr. Nathaneil Bakes out of office.

## 2023-07-01 ENCOUNTER — Encounter: Payer: Self-pay | Admitting: Cardiology

## 2023-07-01 ENCOUNTER — Ambulatory Visit: Attending: Cardiology | Admitting: Cardiology

## 2023-07-01 ENCOUNTER — Telehealth: Payer: Self-pay

## 2023-07-01 VITALS — BP 144/77 | HR 70 | Ht 67.5 in | Wt 201.2 lb

## 2023-07-01 DIAGNOSIS — I10 Essential (primary) hypertension: Secondary | ICD-10-CM | POA: Diagnosis not present

## 2023-07-01 DIAGNOSIS — N182 Chronic kidney disease, stage 2 (mild): Secondary | ICD-10-CM

## 2023-07-01 DIAGNOSIS — Z951 Presence of aortocoronary bypass graft: Secondary | ICD-10-CM

## 2023-07-01 DIAGNOSIS — I2510 Atherosclerotic heart disease of native coronary artery without angina pectoris: Secondary | ICD-10-CM | POA: Diagnosis not present

## 2023-07-01 DIAGNOSIS — I700 Atherosclerosis of aorta: Secondary | ICD-10-CM | POA: Diagnosis not present

## 2023-07-01 DIAGNOSIS — E785 Hyperlipidemia, unspecified: Secondary | ICD-10-CM | POA: Diagnosis not present

## 2023-07-01 MED ORDER — RAMIPRIL 5 MG PO CAPS: 5.0000 mg | ORAL_CAPSULE | Freq: Every day | ORAL | 1 refills | Status: DC

## 2023-07-01 MED ORDER — METOPROLOL TARTRATE 50 MG PO TABS: 75.0000 mg | ORAL_TABLET | Freq: Two times a day (BID) | ORAL | 3 refills | Status: DC

## 2023-07-01 NOTE — Patient Instructions (Addendum)
 Medication Instructions:  Your physician has recommended you make the following change in your medication:   START Ramipril  5 mg once daily   Refill for Metoprolol  Tartrate (Lopressor ) has been sent to your mail order pharmacy.  *If you need a refill on your cardiac medications before your next appointment, please call your pharmacy*  Lab Work: To be completed in 2 weeks: BMP (approximately 07/15/23)  If you have labs (blood work) drawn today and your tests are completely normal, you will receive your results only by: MyChart Message (if you have MyChart) OR A paper copy in the mail If you have any lab test that is abnormal or we need to change your treatment, we will call you to review the results.  Testing/Procedures: None ordered today.  Follow-Up: At [facility], you and your health needs are our priority.  As part of our continuing mission to provide you with exceptional heart care, our providers are all part of one team.  This team includes your primary Cardiologist (physician) and Advanced Practice Providers or APPs (Physician Assistants and Nurse Practitioners) who all work together to provide you with the care you need, when you need it.  Your next appointment:   07/21/23 at 10:40 AM  The format for your next appointment:   In Person  Provider:   [name], MD{  We recommend signing up for the patient portal called "MyChart".  Sign up information is provided on this After Visit Summary.  MyChart is used to connect with patients for Virtual Visits (Telemedicine).  Patients are able to view lab/test results, encounter notes, upcoming appointments, etc.  Non-urgent messages can be sent to your provider as well.   To learn more about what you can do with MyChart, go to [email or web address].

## 2023-07-01 NOTE — Progress Notes (Signed)
 Cardiology Office Note:   Date:  07/07/2023  ID:  Amber Guthridge, DOB 06/03/1945, MRN [id number] PCP: Verma Gobble, NP  [facility] Providers Cardiologist:  Arun K Thukkani, MD    History of Present Illness:   Discussed the use of AI scribe software for clinical note transcription with the patient, who gave verbal consent to proceed.  History of Present Illness Tim Walters is a 78 year old male with coronary artery disease and hypertension who presents for follow-up after coronary artery bypass grafting (CABG).  He underwent CABG x4 on June 08, 2023, with LIMA to LAD, SVG to OM, SVG to PDA, and SVG to diagonal vessel. Postoperatively, he developed sinus tachycardia with PVCs and ventricular bigeminy. Due to post surgical swelling, patient given Demadex . Following discharge, he experienced an allergic reaction to Demadex , characterized by a rash on his torso that was itchy and burning, lasting almost ten days. This was treated with prednisone , which resolved the rash, though some residual itching remains. No swelling despite having to stop the Demadex .  He has 'good days and bad days' post-surgery, with some days feeling energetic enough to walk a mile, albeit in segments with breaks. He notes tenderness and a palm-sized numb spot on his chest, which is not bothersome unless he coughs or sneezes. No significant chest discomfort during walks, though he occasionally stops to catch his breath.  He was previously on 20 mg of ramipril , which was reduced to 5 mg post-surgery and then held due to the rash. He has not resumed it since. He continues on metoprolol , taking 75 mg twice daily, which is three times his pre-surgery dose. He also takes 325 mg of aspirin  and reports a reduction in his weight from 210 lbs preoperatively to 197.4 lbs, attributed to a reduced appetite and modified carb diet.  No issues with the leg from vein harvesting, noting only a tender scab that is  healing. No palpitations or racing heartbeats since leaving the hospital.    Studies Reviewed:    EKG:   EKG Interpretation Date/Time:  Friday Jul 01 2023 11:03:35 EDT Ventricular Rate:  70 PR Interval:  144 QRS Duration:  84 QT Interval:  366 QTC Calculation: 395 R Axis:   65  Text Interpretation: Normal sinus rhythm T wave abnormality, consider inferior ischemia When compared with ECG of 13-Jun-2023 09:22, Premature ventricular complexes are no longer Present Vent. rate has decreased BY  42 BPM Nonspecific T wave abnormality now evident in Lateral leads Confirmed by Leala Prince [phone]) on 07/01/2023 11:09:49 AM    05/24/23 LHC    Prox LAD to Mid LAD lesion is 70% stenosed.   Mid LM to Dist LM lesion is 90% stenosed.   1st Diag lesion is 50% stenosed.   Prox RCA to Mid RCA lesion is 70% stenosed.   The left ventricular systolic function is normal.   LV end diastolic pressure is normal.   The left ventricular ejection fraction is 55-65% by visual estimate.   Left main and severe 2 vessel CAD Normal LV function Normal LVEDP  Risk Assessment/Calculations:     Physical Exam:   VS:  BP (!) 144/77   Pulse 70   Ht 5' 7.5" (1.715 m)   Wt 201 lb 3.2 oz (91.3 kg)   SpO2 94%   BMI 31.05 kg/m    Wt Readings from Last 3 Encounters:  07/04/23 201 lb 9.6 oz (91.4 kg)  07/01/23 201 lb 3.2 oz (91.3 kg)  06/24/23 202 lb 12.8 oz (92 kg)     Physical Exam Vitals reviewed.  Constitutional:      Appearance: Normal appearance.  HENT:     Head: Normocephalic.  Eyes:     Pupils: Pupils are equal, round, and reactive to light.  Cardiovascular:     Rate and Rhythm: Normal rate and regular rhythm.     Pulses: Normal pulses.     Heart sounds: Normal heart sounds.  Pulmonary:     Effort: Pulmonary effort is normal.     Breath sounds: Normal breath sounds.  Abdominal:     General: Abdomen is flat.     Palpations: Abdomen is soft.  Musculoskeletal:     Right lower leg: No edema.      Left lower leg: No edema.  Skin:    General: Skin is warm and dry.     Capillary Refill: Capillary refill takes less than 2 seconds.     Comments: Well healing medial sternotomy incision.  Neurological:     General: No focal deficit present.     Mental Status: He is alert and oriented to person, place, and time.  Psychiatric:        Mood and Affect: Mood normal.        Behavior: Behavior normal.        Thought Content: Thought content normal.        Judgment: Judgment normal.     ASSESSMENT AND PLAN:    Assessment & Plan Coronary artery disease, post-CABG Status post-CABG x4 on June 08, 2023, with LIMA to LAD, SVG to OM, SVG to PDA, and SVG to diagonal vessel. Postoperative course complicated by sinus tachycardia with PVCs and ventricular bigeminy, managed with metoprolol . Currently experiencing variable recovery typical of post-CABG, with no significant chest discomfort on exertion, indicating improved myocardial perfusion. Tolerating increasing physical activity. Incision healing well, and no issues with leg vein harvest site. Exertional tolerance expected to improve over time. - Continue metoprolol  tartrate 75 mg twice daily. - Continue ASA 325mg  until advised to reduce to low dose - Encourage participation in cardiac rehabilitation program once cleared by CT surgery as well.  Sinus tachycardia with PVCs post-surgery Postoperative sinus tachycardia with PVCs and ventricular bigeminy managed with metoprolol . Current EKG shows no PVCs, and heart rate is appropriate. Metoprolol  effectively managing symptoms. - Continue metoprolol  tartrate 75 mg twice daily.  Hypertension Blood pressure elevated since discontinuation of ramipril  in setting of  allergic reaction (suspected due to Demadex  as patient previously tolerated Ramipril ). Previously well-controlled on ramipril  20 mg. Plan to reintroduce ramipril  5 mg to manage blood pressure and reduce cardiac workload during recovery.  Weight loss and dietary modifications may also contribute to improved blood pressure control. - Restart ramipril  5 mg daily. - Recheck metabolic panel in 1-2 weeks after restarting ramipril  to monitor kidney function and potassium levels.  Hyperlipidemia LDL cholesterol well-controlled at 33 mg/dL, below target of 55 mg/dL. Current regimen of Lipitor  80 mg and Zetia  10 mg effective in maintaining low LDL levels, reducing risk of recurrent coronary artery disease.  - Continue Lipitor  80 mg daily. - Continue Zetia  10 mg daily.  Stage 2 chronic kidney disease Stage 2 CKD with stable kidney function. Plan to monitor kidney function after reintroduction of ramipril  due to potential impact on renal function. - Recheck metabolic panel in 1-2 weeks after restarting ramipril  to monitor kidney function. - Could consider an SGLT for further renal protection  Aortic atherosclerosis - Continue ASA -  Continue cholesterol regimen as above  Allergic reaction to Demadex  Allergic reaction to Demadex  with rash, itching, and burning, resolved with prednisone . Demadex  discontinued, and no further allergic symptoms reported. Has no swelling today.        Cardiac Rehabilitation Eligibility Assessment          Signed, Leala Prince, PA-C

## 2023-07-01 NOTE — Transitions of Care (Post Inpatient/ED Visit) (Signed)
 Transition of Care week 3  Visit Note  07/01/2023  Name: Tim Walters MRN: [id number]          DOB: 1945-06-17  Situation: Patient enrolled in [facility] 30-day program. Visit completed with patient by telephone.   Background: Patient discharged from [facility] 06/15/23 s/p CABGx4 06/08/23. Patient doing well and saw cardiology, PA-C, Leala Prince earlier today. Patient restarted Ramipril  5mg  - BP at appt 144/77 and weight was 201lbs. Patient states he walked a total of 1 mile today broken into 1/3 of a mile x3 times and plans to increase to 1/2 mile as tolerated. Patient has appt to see surgeon, Dr Sherene Dilling 07/13/23 with a chest xray 1 hour before and will plan to start cardiac rehab after surgeon gives the okay. Patient states rash is basically gone with some mild itching remaining.   Initial Transition Care Management Follow-up Telephone Call    Past Medical History:  Diagnosis Date   Anginal pain (HCC)    Arthritis    lt ankle   Basal cell carcinoma    x2 (excised)   Breast lump    Per records from [facility]    Colon polyp    Coronary artery disease    a. BMS to RCA 2003 with residual LAD/diag disease treated medically, normal EF.   COVID    Depression    Diverticulosis    Per records from [facility]    GERD (gastroesophageal reflux disease)    Hyperkalemia    a. K of 5.2 in 2017.   Hyperlipidemia    Hypertension    Idiopathic peripheral neuropathy    Per records from [facility]    Impaired fasting glucose    Per records from [facility]    Kidney stones    Monoclonal gammopathy of undetermined significance    Per records from [facility]    Myocardial infarction [facility]) 11/15/2001   Dr. Kay Parson [facility])   OSA (obstructive sleep apnea)    Per [facility] New Patient Packet   Peripheral neuropathy    Left Foot, Per [facility] New Patient Packet   Pre-diabetes    Sleep apnea    had test several yr ago-said he did not need a cpap-still snores    Torn rotator cuff    Per [facility] New Patient Packet   Venous insufficiency of left leg    Per [facility] New Patient Packet   Wears glasses     Assessment: Patient Reported Symptoms: Cognitive Cognitive Status: Alert and oriented to person, place, and time, Normal speech and language skills Cognitive/Intellectual Conditions Management [RPT]: None reported or documented in medical history or problem list      Neurological Neurological Review of Symptoms: No symptoms reported    HEENT HEENT Symptoms Reported: No symptoms reported      Cardiovascular Cardiovascular Symptoms Reported: No symptoms reported Cardiovascular Comment: Patient was seen today by cardiology - weight at appt 201 and BP was 144/77 and patient states EKG was normal  Respiratory Respiratory Symptoms Reported: No symptoms reported    Endocrine Patient reports the following symptoms related to hypoglycemia or hyperglycemia : No symptoms reported    Gastrointestinal Gastrointestinal Symptoms Reported: No symptoms reported      Genitourinary Genitourinary Symptoms Reported: No symptoms reported    Integumentary Integumentary Symptoms Reported: Incision Additional Integumentary Details: patient states cardiology looked at all incisions and states PA was amazed at how well his incisions are healing -1 center chest + 3 chest tube sites +  right leg harvest site- all OTA Skin Conditions: Itching Skin Comment: Patient states rash is virtually gone now but still has some itching  Musculoskeletal Musculoskelatal Symptoms Reviewed: No symptoms reported        Psychosocial           There were no vitals filed for this visit.  Medications Reviewed Today     Reviewed by Sharmaine Dearth, RN (Registered Nurse) on 07/01/23 at 1357  Med List Status: <None>   Medication Order Taking? Sig Documenting Provider Last Dose Status Informant  acetaminophen  (TYLENOL ) 325 MG tablet [id number] Yes Take 2 tablets (650 mg total) by mouth  every 6 (six) hours as needed. Randa Burton, PA-C Taking Active   aspirin  EC 325 MG tablet [id number] Yes Take 1 tablet (325 mg total) by mouth daily. Randa Burton, PA-C Taking Active   atorvastatin  (LIPITOR ) 80 MG tablet [id number] Yes TAKE 1 TABLET BY MOUTH ONCE  DAILY Eubanks, Jessica K, NP Taking Active Self  ezetimibe  (ZETIA ) 10 MG tablet [id number] Yes TAKE 1 TABLET BY MOUTH DAILY Thukkani, Arun K, MD Taking Active Self  metoprolol  tartrate (LOPRESSOR ) 50 MG tablet [id number] Yes Take 1.5 tablets (75 mg total) by mouth 2 (two) times daily. Leala Prince, PA-C Taking Active   Multiple Vitamin (MULTIVITAMIN WITH MINERALS) TABS tablet [id number]  Take 1 tablet by mouth daily.  Patient not taking: Reported on 06/21/2023   [provider]  Active Self  omeprazole (PRILOSEC) 20 MG capsule [id number] Yes Take 20 mg by mouth daily. [provider] Taking Active Self  predniSONE  (DELTASONE ) 20 MG tablet [id number] No Take 1 tablet (20 mg total) by mouth daily with breakfast.  Patient not taking: Reported on 07/01/2023   Tye Gall, MD Not Taking Consider Medication Status and Discontinue (Completed Course)   ramipril  (ALTACE ) 5 MG capsule [id number] Yes Take 1 capsule (5 mg total) by mouth daily. Leala Prince, PA-C Taking Active   sertraline  (ZOLOFT ) 50 MG tablet [id number] Yes TAKE 1 TABLET BY MOUTH DAILY Eubanks, Jessica K, NP Taking Active Self  torsemide  (DEMADEX ) 20 MG tablet [id number]  Take 1 tablet (20mg ) daily for 7 days OR until you reach your preoperative weight of 210lbs and swelling has resolved  Patient not taking: Reported on 06/24/2023   Randa Burton, PA-C  Active             Recommendation:   PCP appt 07/04/23 Specialty provider follow-up surgeon, Dr Sherene Dilling 07/13/23  Follow Up Plan:   Telephone follow up appointment date/time:  07/08/23  Tonia Frankel RN, CCM [place]  VBCI-Population Health RN Care Manager [phone]

## 2023-07-04 ENCOUNTER — Ambulatory Visit: Admitting: Nurse Practitioner

## 2023-07-04 ENCOUNTER — Encounter: Payer: Self-pay | Admitting: Nurse Practitioner

## 2023-07-04 VITALS — BP 122/80 | HR 54 | Temp 98.0°F | Ht 67.5 in | Wt 201.6 lb

## 2023-07-04 DIAGNOSIS — E6609 Other obesity due to excess calories: Secondary | ICD-10-CM | POA: Insufficient documentation

## 2023-07-04 DIAGNOSIS — F325 Major depressive disorder, single episode, in full remission: Secondary | ICD-10-CM | POA: Diagnosis not present

## 2023-07-04 DIAGNOSIS — I25810 Atherosclerosis of coronary artery bypass graft(s) without angina pectoris: Secondary | ICD-10-CM | POA: Diagnosis not present

## 2023-07-04 DIAGNOSIS — I10 Essential (primary) hypertension: Secondary | ICD-10-CM | POA: Diagnosis not present

## 2023-07-04 DIAGNOSIS — E66811 Obesity, class 1: Secondary | ICD-10-CM

## 2023-07-04 DIAGNOSIS — Z6831 Body mass index (BMI) 31.0-31.9, adult: Secondary | ICD-10-CM

## 2023-07-04 NOTE — Assessment & Plan Note (Signed)
 S/p CABG x4, without chest pains at this time.  Continues on ASA 325 mg daily with metoprolol  and ramipril 

## 2023-07-04 NOTE — Assessment & Plan Note (Signed)
 Blood pressure well controlled, goal bp <140/90 Continue current medications and dietary modifications follow metabolic panel

## 2023-07-04 NOTE — Assessment & Plan Note (Signed)
 Without worsening anxiety or depression post CABG  Will monitor Continues on zoloft  50 mg daily

## 2023-07-04 NOTE — Progress Notes (Signed)
 Careteam: Patient Care Team: Verma Gobble, NP as PCP - General (Geriatric Medicine) Kyra Phy, MD as PCP - Cardiology (Cardiology) Boyce Byes, MD as Consulting Physician (General Surgery) Osa Blase, MD as Consulting Physician (Orthopedic Surgery) Harlen Lick, MD as Consulting Physician (Dermatology) Brian Campanile, MD (Inactive) as Consulting Physician (Neurology) Waylon Hahn, OD (Optometry) [facility] [place] Kyra Phy, MD as Consulting Physician (Cardiology) Sharmaine Dearth, RN as Registered Nurse  PLACE OF SERVICE:  [facility]  Advanced Directive information    Allergies  Allergen Reactions   Demadex  [Torsemide ] Rash    Chief Complaint  Patient presents with   Hospitalization Follow-up    HPI:  Discussed the use of AI scribe software for clinical note transcription with the patient, who gave verbal consent to proceed.  History of Present Illness Tim Walters is a 78 year old male with coronary artery disease who presents for a hospital follow-up after coronary artery bypass grafting.  He underwent a cardiac catheterization followed by a coronary artery bypass graft (CABG) times four on June 08, 2023. He is now almost a month post-surgery and can walk three-quarters of a mile without experiencing chest pain or shortness of breath, although he does experience some fatigue. He notes tenderness in his chest and numbness in a palm-sized area above his breast, which he attributes to the surgery.  A normal EKG last Friday showed no signs of atrial fibrillation, which was a concern. He experienced a severe rash after being prescribed Demadex  for post-surgical leg swelling. The rash, characterized by itching and burning, lasted ten days and was treated with a steroid injection and prednisone , which resolved the symptoms.  He was previously on 20 mg of ramipril  before surgery, which was discontinued during hospitalization. He  has resumed ramipril  at 5 mg post-discharge. His blood pressure readings have been stable since resuming ramipril , and he reports a significant weight loss, attributing it to a reduced appetite and careful dietary monitoring.  He mentions a past concern about chronic kidney disease-- noted a transient increase in creatinine levels during hospitalization, but his kidney function has since normalized.  No chest pain, shortness of breath, swelling, bowel or urinary issues. He reports a resolved rash from Demadex  and no current itching. No worsening anxiety or depression   Review of Systems:  Review of Systems  Constitutional:  Negative for chills, fever and weight loss.  HENT:  Negative for tinnitus.   Respiratory:  Negative for cough, sputum production and shortness of breath.   Cardiovascular:  Negative for chest pain, palpitations and leg swelling.  Gastrointestinal:  Negative for abdominal pain, constipation, diarrhea and heartburn.  Genitourinary:  Negative for dysuria, frequency and urgency.  Musculoskeletal:  Negative for back pain, falls, joint pain and myalgias.  Skin: Negative.   Neurological:  Negative for dizziness and headaches.  Psychiatric/Behavioral:  Negative for depression and memory loss. The patient does not have insomnia.     Past Medical History:  Diagnosis Date   Anginal pain (HCC)    Arthritis    lt ankle   Basal cell carcinoma    x2 (excised)   Breast lump    Per records from [facility]    Colon polyp    Coronary artery disease    a. BMS to RCA 2003 with residual LAD/diag disease treated medically, normal EF.   COVID    Depression    Diverticulosis    Per records from [facility]  GERD (gastroesophageal reflux disease)    Hyperkalemia    a. K of 5.2 in 2017.   Hyperlipidemia    Hypertension    Idiopathic peripheral neuropathy    Per records from [facility]    Impaired fasting glucose    Per records from [facility]    Kidney  stones    Monoclonal gammopathy of undetermined significance    Per records from [facility]    Myocardial infarction [facility]) 11/15/2001   Dr. Kay Parson [facility])   OSA (obstructive sleep apnea)    Per [facility] New Patient Packet   Peripheral neuropathy    Left Foot, Per [facility] New Patient Packet   Pre-diabetes    Sleep apnea    had test several yr ago-said he did not need a cpap-still snores   Torn rotator cuff    Per [facility] New Patient Packet   Venous insufficiency of left leg    Per [facility] New Patient Packet   Wears glasses    Past Surgical History:  Procedure Laterality Date   ANGIOPLASTY  2003   Per records from [facility]    BASAL CELL CARCINOMA EXCISION     left tricep   BASAL CELL CARCINOMA EXCISION  11/10/2022   right shoulder   CARDIAC CATHETERIZATION  23003   stent rca   CARDIOVASCULAR STRESS TEST  02/16/2016   Per records from [facility] [place] [facility]    COLONOSCOPY  06/17/2003   Per records from [facility], Dr.Ganem to be repeated 2015   CORONARY ARTERY BYPASS GRAFT N/A 06/08/2023   Procedure: CORONARY ARTERY BYPASS GRAFTING X FOUR, USING LEFT INTERNAL MAMMARY ARTERY AND ENDOSCOPICALLY HARVESTED RIGHT GREATER SAPHENOUS VEIN GRAFT;  Surgeon: Bartley Lightning, MD;  Location: [facility] OR;  Service: Open Heart Surgery;  Laterality: N/A;   coronary artery stent  11/15/2001   CORONARY STENT PLACEMENT  11/15/2001   Per records from [place] [facility]    CYST REMOVAL TRUNK Left 05/08/2013   Procedure: CYST REMOVAL BACK;  Surgeon: Levert Ready, MD;  Location: [facility];  Service: General;  Laterality: Left;   INGUINAL HERNIA REPAIR Left 03/24/2015   Procedure: OPEN REPAIR LEFT INGUINAL HERNIA ;  Surgeon: Boyce Byes, MD;  Location: [facility];  Service: General;  Laterality: Left;   INSERTION OF MESH Left 03/24/2015   Procedure: INSERTION OF MESH;  Surgeon: Boyce Byes, MD;  Location: [facility];  Service: General;   Laterality: Left;   INTRAOPERATIVE TRANSESOPHAGEAL ECHOCARDIOGRAM N/A 06/08/2023   Procedure: ECHOCARDIOGRAM, TRANSESOPHAGEAL, INTRAOPERATIVE;  Surgeon: Bartley Lightning, MD;  Location: [facility] OR;  Service: Open Heart Surgery;  Laterality: N/A;   LEFT HEART CATH AND CORONARY ANGIOGRAPHY N/A 05/24/2023   Procedure: LEFT HEART CATH AND CORONARY ANGIOGRAPHY;  Surgeon: Swazil[place], Peter M, MD;  Location: [facility] INVASIVE CV LAB;  Service: Cardiovascular;  Laterality: N/A;   SHOULDER ARTHROSCOPY W/ ROTATOR CUFF REPAIR  2011   right   SPINE SURGERY  02/12/1993   L2, L3 fragmented disc   VASECTOMY  1987   VEIN REPAIR  09/2018   Vein injections    Social History:   reports that he quit smoking about 49 years ago. His smoking use included cigarettes. He started smoking about 61 years ago. He has never used smokeless tobacco. He reports that he does not currently use alcohol after a past usage of about 21.0 standard drinks of alcohol per week. He reports that he does not use drugs.  Family History  Problem Relation Age of Onset   Macular degeneration Mother    COPD Mother    Non-Hodgkin's lymphoma Mother    Lung cancer Mother        Per [facility] New Patient Packet    Hypertension Father    Suicidality Father 41       Per [facility] New Patient Packet    Depression Father    Angina Father        Per records from [facility]    Diverticulosis Father        Per records from [facility]    Heart disease Father    High Cholesterol Sister    Macular degeneration Maternal Grandmother    Schizophrenia Son    Bipolar disorder Son    Autism Son    Post-traumatic stress disorder Son    Diabetes type II Daughter    Breast cancer Neg Hx    Colon cancer Neg Hx    Esophageal cancer Neg Hx    Stomach cancer Neg Hx    Liver disease Neg Hx    Pancreatic cancer Neg Hx     Medications: Patient's Medications  New Prescriptions   No medications on file  Previous Medications   ACETAMINOPHEN  (TYLENOL ) 325 MG TABLET     Take 2 tablets (650 mg total) by mouth every 6 (six) hours as needed.   ASPIRIN  EC 325 MG TABLET    Take 1 tablet (325 mg total) by mouth daily.   ATORVASTATIN  (LIPITOR ) 80 MG TABLET    TAKE 1 TABLET BY MOUTH ONCE  DAILY   EZETIMIBE  (ZETIA ) 10 MG TABLET    TAKE 1 TABLET BY MOUTH DAILY   METOPROLOL  TARTRATE (LOPRESSOR ) 50 MG TABLET    Take 1.5 tablets (75 mg total) by mouth 2 (two) times daily.   OMEPRAZOLE (PRILOSEC) 20 MG CAPSULE    Take 20 mg by mouth daily.   RAMIPRIL  (ALTACE ) 5 MG CAPSULE    Take 1 capsule (5 mg total) by mouth daily.   SERTRALINE  (ZOLOFT ) 50 MG TABLET    TAKE 1 TABLET BY MOUTH DAILY  Modified Medications   No medications on file  Discontinued Medications   MULTIPLE VITAMIN (MULTIVITAMIN WITH MINERALS) TABS TABLET    Take 1 tablet by mouth daily.   PREDNISONE  (DELTASONE ) 20 MG TABLET    Take 1 tablet (20 mg total) by mouth daily with breakfast.   TORSEMIDE  (DEMADEX ) 20 MG TABLET    Take 1 tablet (20mg ) daily for 7 days OR until you reach your preoperative weight of 210lbs and swelling has resolved    Physical Exam:  Vitals:   07/04/23 1329  BP: 122/80  Pulse: (!) 54  Temp: 98 F (36.7 C)  SpO2: 96%  Weight: 201 lb 9.6 oz (91.4 kg)  Height: 5' 7.5" (1.715 m)   Body mass index is 31.11 kg/m. Wt Readings from Last 3 Encounters:  07/04/23 201 lb 9.6 oz (91.4 kg)  07/01/23 201 lb 3.2 oz (91.3 kg)  06/24/23 202 lb 12.8 oz (92 kg)    Physical Exam Constitutional:      General: He is not in acute distress.    Appearance: He is well-developed. He is not diaphoretic.  HENT:     Head: Normocephalic and atraumatic.     Right Ear: External ear normal.     Left Ear: External ear normal.     Mouth/Throat:     Pharynx: No oropharyngeal exudate.  Eyes:     Conjunctiva/sclera:  Conjunctivae normal.     Pupils: Pupils are equal, round, and reactive to light.  Cardiovascular:     Rate and Rhythm: Normal rate and regular rhythm.     Heart sounds: Normal heart  sounds.  Pulmonary:     Effort: Pulmonary effort is normal.     Breath sounds: Normal breath sounds.  Abdominal:     General: Bowel sounds are normal.     Palpations: Abdomen is soft.  Musculoskeletal:        General: No tenderness.     Cervical back: Normal range of motion and neck supple.     Right lower leg: No edema.     Left lower leg: No edema.  Skin:    General: Skin is warm and dry.  Neurological:     Mental Status: He is alert and oriented to person, place, and time.     Labs reviewed: Basic Metabolic Panel: Recent Labs    08/20/22 1603 11/09/22 0826 06/09/23 0408 06/09/23 1720 06/10/23 0413 06/13/23 1013 06/14/23 0327 06/15/23 0912 06/21/23 1458  NA  --    < > 133* 132*   < >  --  139 135 140  K  --    < > 4.4 4.6   < >  --  4.1 4.0 4.5  CL  --    < > 104 102   < >  --  105 100 105  CO2  --    < > 21* 21*   < >  --  26 27 27   GLUCOSE  --    < > 125* 158*   < >  --  113* 137* 94  BUN  --    < > 21 26*   < >  --  37* 31* 29*  CREATININE  --    < > 1.10 1.74*   < >  --  1.10 1.09 1.00  CALCIUM   --    < > 7.7* 7.8*   < >  --  8.3* 8.6* 9.0  MG  --    < > 2.6* 2.5*  --  2.0  --   --   --   TSH 2.350  --   --   --   --   --   --   --   --    < > = values in this interval not displayed.   Liver Function Tests: Recent Labs    04/20/23 1103 05/17/23 0832 06/07/23 1130  AST 24 19 26   ALT 33 21 30  ALKPHOS 84  --  52  BILITOT 0.5 0.7 0.8  PROT 6.5 6.1 6.5  ALBUMIN  4.0  --  3.5   No results for input(s): "LIPASE", "AMYLASE" in the last 8760 hours. No results for input(s): "AMMONIA" in the last 8760 hours. CBC: Recent Labs    11/09/22 0826 05/17/23 0832 06/07/23 1130 06/10/23 0413 06/13/23 0335 06/21/23 1458  WBC 6.2 5.8   < > 11.4* 6.7 15.3*  NEUTROABS 3,962 4,002  --   --   --  12,072*  HGB 14.3 13.1*   < > 10.5* 9.1* 11.5*  HCT 43.6 40.6   < > 32.5* 27.7* 34.5*  MCV 95.4 96.0   < > 99.4 94.5 95.8  PLT 185 178   < > 119* 148* 426*   < > =  values in this interval not displayed.   Lipid Panel: Recent Labs    10/26/22 1009 04/20/23 1100 05/17/23 6213  CHOL 114 106 108  HDL 63 54 61  LDLCALC 39 41 33  TRIG 51 41 49  CHOLHDL 1.8 2.0 1.8   TSH: Recent Labs    08/20/22 1603  TSH 2.350   A1C: Lab Results  Component Value Date   HGBA1C 6.0 (H) 06/07/2023     Assessment/Plan  Coronary artery disease involving coronary bypass graft of native heart without angina pectoris Assessment & Plan: S/p CABG x4, without chest pains at this time.  Continues on ASA 325 mg daily with metoprolol  and ramipril    Essential hypertension, benign Assessment & Plan: Blood pressure well controlled, goal bp <140/90 Continue current medications and dietary modifications follow metabolic panel   Major depressive disorder in full remission, unspecified whether recurrent [facility]) Assessment & Plan: Without worsening anxiety or depression post CABG  Will monitor Continues on zoloft  50 mg daily   Class 1 obesity due to excess calories with serious comorbidity and body mass index (BMI) of 31.0 to 31.9 in adult Assessment & Plan: -he is working on losing weight, education provided on healthy weight loss through increase in physical activity and proper nutrition.    To keep scheduled follow up Tyjae Issa K. Denney Fisherman [facility] [phone]

## 2023-07-04 NOTE — Assessment & Plan Note (Addendum)
-  he is working on losing weight, education provided on healthy weight loss through increase in physical activity and proper nutrition.

## 2023-07-06 ENCOUNTER — Other Ambulatory Visit: Payer: Self-pay | Admitting: Surgery

## 2023-07-06 DIAGNOSIS — I2510 Atherosclerotic heart disease of native coronary artery without angina pectoris: Secondary | ICD-10-CM

## 2023-07-08 ENCOUNTER — Other Ambulatory Visit: Payer: Self-pay

## 2023-07-08 NOTE — Transitions of Care (Post Inpatient/ED Visit) (Signed)
 Transition of Care week 4  Visit Note  07/08/2023  Name: Tim Walters MRN: [id number]          DOB: 1945/04/09  Situation: Patient enrolled in [facility] 30-day program. Visit completed with patient by telephone.   Background: Patient discharged from [facility] 06/15/23 s/p CABGx4 06/08/23. Patient confirms seeing PCP 07/04/23 as planned/record reviewed from visit note - BP was 122/80 and today patient reports BP 120/76 HR 71 and walked a mile yesterday and 1.45miles the day before. Patient states he is tired today - encouraged patient to increase activity slowly and not to overdo it. Patient states all incisions - 1 center chest + 3 chest tube sites + right leg harvest site look good and deny any signs of infection. Patient states he called cardiac rehab and was told there would be no issue getting him scheduled once he gets the okay from Dr Sherene Dilling - appointment is 07/13/23   Initial Transition Care Management Follow-up Telephone Call    Past Medical History:  Diagnosis Date   Anginal pain (HCC)    Arthritis    lt ankle   Basal cell carcinoma    x2 (excised)   Breast lump    Per records from [facility]    Colon polyp    Coronary artery disease    a. BMS to RCA 2003 with residual LAD/diag disease treated medically, normal EF.   COVID    Depression    Diverticulosis    Per records from [facility]    GERD (gastroesophageal reflux disease)    Hyperkalemia    a. K of 5.2 in 2017.   Hyperlipidemia    Hypertension    Idiopathic peripheral neuropathy    Per records from [facility]    Impaired fasting glucose    Per records from [facility]    Kidney stones    Monoclonal gammopathy of undetermined significance    Per records from [place] [facility]    Myocardial infarction [facility]) 11/15/2001   Dr. Kay Parson [facility])   OSA (obstructive sleep apnea)    Per [facility] New Patient Packet   Peripheral neuropathy    Left Foot, Per [facility] New Patient Packet    Pre-diabetes    Sleep apnea    had test several yr ago-said he did not need a cpap-still snores   Torn rotator cuff    Per [facility] New Patient Packet   Venous insufficiency of left leg    Per [facility] New Patient Packet   Wears glasses     Assessment: Patient Reported Symptoms: Cognitive Cognitive Status: Alert and oriented to person, place, and time, Normal speech and language skills Cognitive/Intellectual Conditions Management [RPT]: None reported or documented in medical history or problem list      Neurological Neurological Review of Symptoms: No symptoms reported    HEENT HEENT Symptoms Reported: No symptoms reported      Cardiovascular Cardiovascular Symptoms Reported: No symptoms reported Cardiovascular Conditions: Hypertension, Coronary artery disease Cardiovascular Management Strategies: Medication therapy, Diet modification Weight: 196 lb 12.8 oz (89.3 kg) Cardiovascular Comment: patient reports BP today 120/76 HR 71and on 07/07/23 127/82 HR 75  Respiratory Respiratory Symptoms Reported: No symptoms reported    Endocrine Patient reports the following symptoms related to hypoglycemia or hyperglycemia : No symptoms reported    Gastrointestinal Gastrointestinal Symptoms Reported: No symptoms reported      Genitourinary Genitourinary Symptoms Reported: No symptoms reported    Integumentary      Musculoskeletal  Psychosocial           Vitals:   07/08/23 1346  BP: 120/76  Pulse: 71    Medications Reviewed Today     Reviewed by Sharmaine Dearth, RN (Registered Nurse) on 07/08/23 at 1340  Med List Status: <None>   Medication Order Taking? Sig Documenting Provider Last Dose Status Informant  acetaminophen  (TYLENOL ) 325 MG tablet [id number] Yes Take 2 tablets (650 mg total) by mouth every 6 (six) hours as needed. Randa Burton, PA-C Taking Active   aspirin  EC 325 MG tablet [id number] Yes Take 1 tablet (325 mg total) by mouth daily. Randa Burton, PA-C  Taking Active   atorvastatin  (LIPITOR ) 80 MG tablet [id number] Yes TAKE 1 TABLET BY MOUTH ONCE  DAILY Eubanks, Jessica K, NP Taking Active Self  ezetimibe  (ZETIA ) 10 MG tablet [id number] Yes TAKE 1 TABLET BY MOUTH DAILY Thukkani, Arun K, MD Taking Active Self  metoprolol  tartrate (LOPRESSOR ) 50 MG tablet [id number] Yes Take 1.5 tablets (75 mg total) by mouth 2 (two) times daily. Leala Prince, PA-C Taking Active   omeprazole (PRILOSEC) 20 MG capsule [id number] Yes Take 20 mg by mouth daily. [provider] Taking Active Self  ramipril  (ALTACE ) 5 MG capsule [id number] Yes Take 1 capsule (5 mg total) by mouth daily. Leala Prince, PA-C Taking Active   sertraline  (ZOLOFT ) 50 MG tablet [id number] Yes TAKE 1 TABLET BY MOUTH DAILY Eubanks, Jessica K, NP Taking Active Self            Recommendation:   Specialty provider follow-up 07/13/23 with Dr Sherene Dilling  Follow Up Plan:   Telephone follow up appointment date/time:  07/05/23 3pm - discuss closure  Tonia Frankel RN, CCM [place]  VBCI-Population Health RN Care Manager [phone]

## 2023-07-13 ENCOUNTER — Ambulatory Visit: Attending: Surgery | Admitting: Surgery

## 2023-07-13 ENCOUNTER — Other Ambulatory Visit: Payer: Self-pay

## 2023-07-13 ENCOUNTER — Other Ambulatory Visit (HOSPITAL_COMMUNITY): Payer: Self-pay

## 2023-07-13 ENCOUNTER — Ambulatory Visit (HOSPITAL_COMMUNITY)
Admission: RE | Admit: 2023-07-13 | Discharge: 2023-07-13 | Disposition: A | Discharge status: Home / Self Care | Source: Ambulatory Visit | Attending: Cardiovascular Disease | Admitting: Cardiovascular Disease

## 2023-07-13 ENCOUNTER — Encounter: Payer: Self-pay | Admitting: Surgery

## 2023-07-13 VITALS — BP 148/74 | HR 64 | Resp 20 | Ht 67.5 in | Wt 200.0 lb

## 2023-07-13 DIAGNOSIS — I10 Essential (primary) hypertension: Secondary | ICD-10-CM | POA: Diagnosis not present

## 2023-07-13 DIAGNOSIS — I2510 Atherosclerotic heart disease of native coronary artery without angina pectoris: Secondary | ICD-10-CM

## 2023-07-13 DIAGNOSIS — Z48812 Encounter for surgical aftercare following surgery on the circulatory system: Secondary | ICD-10-CM | POA: Diagnosis not present

## 2023-07-13 DIAGNOSIS — Z951 Presence of aortocoronary bypass graft: Secondary | ICD-10-CM

## 2023-07-13 LAB — BASIC METABOLIC PANEL WITH GFR
BUN/Creatinine Ratio: 22 (ref 10–24)
BUN: 22 mg/dL (ref 8–27)
CO2: 20 mmol/L (ref 20–29)
Calcium: 9.1 mg/dL (ref 8.6–10.2)
Chloride: 104 mmol/L (ref 96–106)
Creatinine, Ser: 0.98 mg/dL (ref 0.76–1.27)
Glucose: 173 mg/dL — ABNORMAL HIGH (ref 70–99)
Potassium: 4.7 mmol/L (ref 3.5–5.2)
Sodium: 139 mmol/L (ref 134–144)
eGFR: 78 mL/min/{1.73_m2} (ref >59)

## 2023-07-13 NOTE — Progress Notes (Signed)
   [address], [place] [address]             [phone]     HPI: Patient returns for routine postoperative follow-up having undergone CABG x 4 on 06/08/2023. [name] early postoperative recovery while in the hospital was notable for an uncomplicated postoperative course. Since hospital discharge the patient reports that he has been walking up to 1-1/2 miles per day without chest pain or shortness of breath.  He is anxious to begin cardiac rehab.  He has been watching his diet closely and has lost 11-12 pounds since discharge.   Current Outpatient Medications  Medication Sig Dispense Refill   aspirin  EC 325 MG tablet Take 1 tablet (325 mg total) by mouth daily.     atorvastatin  (LIPITOR ) 80 MG tablet TAKE 1 TABLET BY MOUTH ONCE  DAILY 90 tablet 3   ezetimibe  (ZETIA ) 10 MG tablet TAKE 1 TABLET BY MOUTH DAILY 90 tablet 3   metoprolol  tartrate (LOPRESSOR ) 50 MG tablet Take 1.5 tablets (75 mg total) by mouth 2 (two) times daily. 135 tablet 3   omeprazole (PRILOSEC) 20 MG capsule Take 20 mg by mouth daily.     ramipril  (ALTACE ) 5 MG capsule Take 1 capsule (5 mg total) by mouth daily. 30 capsule 1   sertraline  (ZOLOFT ) 50 MG tablet TAKE 1 TABLET BY MOUTH DAILY 90 tablet 3   acetaminophen  (TYLENOL ) 325 MG tablet Take 2 tablets (650 mg total) by mouth every 6 (six) hours as needed. (Patient not taking: Reported on 07/13/2023)     No current facility-administered medications for this visit.   Facility-Administered Medications Ordered in Other Visits  Medication Dose Route Frequency Provider Last Rate Last Admin   regadenoson  (LEXISCAN ) injection SOLN 0.4 mg  0.4 mg Intravenous Once Gerald Kitty., NP        Physical Exam: BP (!) 148/74   Pulse 64   Resp 20   Ht 5' 7.5" (1.715 m)   Wt 200 lb (90.7 kg)   SpO2 98% Comment: RA  BMI 30.86 kg/m  He looks well. Cardiac exam shows a regular rate and rhythm with normal heart sounds.  There is no murmur. Lungs  are clear. Chest incision is healing well and the sternum is stable. His right leg incision is healing well and there is no peripheral edema.  Diagnostic Tests:  Narrative & Impression  CLINICAL DATA:  Coronary artery bypass graft   EXAM: CHEST - 2 VIEW   COMPARISON:  Chest x-ray performed June 10, 2023   FINDINGS: Postsurgical changes from sternotomy. Heart and mediastinum are not significantly changed. Degenerative spondylosis of the thoracic spine.   IMPRESSION: 1. Postsurgical changes from sternotomy. 2. No focal infiltrate or pleural effusion.     Electronically Signed   By: Reagan Camera M.D.   On: 07/13/2023 10:26    Impression:  Overall I think he is doing very well 5 weeks following his surgery.  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  I think he is ready to begin cardiac rehab and can start anytime.  Plan:  He will continue to follow-up with cardiology and his PCP and will return to see me if he has any problems with his incisions.   Bartley Lightning, MD [facility] [phone]

## 2023-07-14 ENCOUNTER — Ambulatory Visit: Payer: Self-pay | Admitting: Cardiology

## 2023-07-14 NOTE — Progress Notes (Signed)
 Cardiology Office Note:   Date:  07/22/2023  ID:  Tim Walters, DOB 09-Nov-1945, MRN [id number] PCP:  Verma Gobble, NP  [facility] Providers Cardiologist:  [name], MD Referring MD: Verma Gobble, NP  Chief Complaint/Reason for Referral:  F/u for CAD ASSESSMENT:    1. Hx of CABG   2. Essential hypertension   3. Hyperlipidemia LDL goal <55   4. Aortic atherosclerosis (HCC)   5. Elevated lipoprotein(a)   6. CKD (chronic kidney disease) stage 2, GFR 60-89 ml/min   7. BMI 32.0-32.9,adult   8. Prediabetes       PLAN:   In order of problems listed above: Coronary artery disease: Decrease aspirin  to 81 mg, continue atorvastatin  80 mg, Zetia  10 mg.  TTE with normal LV function; will discontinue metoprolol .   Hyperlipidemia: Continue atorvastatin  80 mg and Zetia  10 mg.  LDL was 33 recently.  Elevated LP(a): Continue atorvastatin  80 mg and Zetia  10 mg; LDL is 33.; target LDL less than 55. Aortic atherosclerosis: Continue aspirin  81 mg, atorvastatin  80 mg, and Zetia  10 mg.  Strict blood pressure control. Hypertension: Blood pressure is above goal; increase ramipril  to 10 mg and check BMP next week. CKD stage II: Increase ramipril  to 10 mg and check BMP next week. Elevated BMI: Diet and exercise modification.  Hemoglobin A1c was 6.3 last September. Prediabetes: Continue aspirin  81 mg, atorvastatin  80 mg, and ramipril  5 mg.  Consider SGLT2 inhibitor if develops diabetes.       I spent 35 minutes reviewing all clinical data during and prior to this visit including all relevant imaging studies, laboratories, clinical information from other health systems and prior notes from both Cardiology and other specialties, interviewing the patient, conducting a complete physical examination, and coordinating care in order to formulate a comprehensive and personalized evaluation and treatment plan.      Dispo:  Return in about 6 months (around 01/21/2024).      Medication  Adjustments/Labs and Tests Ordered: Current medicines are reviewed at length with the patient today.  Concerns regarding medicines are outlined above.  The following changes have been made:     Labs/tests ordered: Orders Placed This Encounter  Procedures   Basic Metabolic Panel (BMET)    Medication Changes: Meds ordered this encounter  Medications   ramipril  (ALTACE ) 10 MG capsule    Sig: Take 1 capsule (10 mg total) by mouth daily.    Dispense:  90 capsule    Refill:  3    Stop metoprolol     Current medicines are reviewed at length with the patient today.  The patient does not have concerns regarding medicines.  I spent 33 minutes reviewing all clinical data during and prior to this visit including all relevant imaging studies, laboratories, clinical information from other health systems and prior notes from both Cardiology and other specialties, interviewing the patient, conducting a complete physical examination, and coordinating care in order to formulate a comprehensive and personalized evaluation and treatment plan.   History of Present Illness:      FOCUSED PROBLEM LIST:   CAD BMS RCA ACS 2003 CABG LIMA to LAD, vein graft to diagonal, vein graft to OM, vein graft to RCA April 2025 Hypertension Hyperlipidemia LP(a) 225 Aortic atherosclerosis Chest CT 2024 CKD stage II BMI 32 Prediabetes  7/24:  The patient is a 78 y.o. male with the indicated medical history here for cardiology follow-up.  Initiation had been cared for by Dr. Felipe Horton for a long time.  He was seen in our clinic in May with increasing fatigue and shortness of breath.  He was referred for nuclear stress test which demonstrated low risk findings.  Echocardiogram demonstrated preserved LV function with no significant valvular abnormalities and aortic dilatation with a diameter of about 40 mm.  Monitor demonstrated asymptomatic SVT and the patient's Coreg  was changed to metoprolol  25 mg twice daily.  His lipid  panel in March showed an LDL of 60.   The patient tells me that he likes to walk.  About 8 months ago he was walking 3 to 4 miles a day.  Now he walks about a mile a day due to bilateral bursitis.  He is participating in PT to help with this.  Interestingly he will develop chest tightness for the first half mile of his walk.  Then during the second half mile this seems to remit.  He has noticed that he has low energy and is fatigued a lot of the day.  He has gained about 10 pounds over the last couple of years due to his hip issues which have made him a less active.  His wife says he does not snore however his Apple watch demonstrates fragmented sleep.  He does have days when he wakes up where he feels not well rested.  He fortunately has not required any emergency room visits or hospitalizations.  He is otherwise well without significant complaints.  Plan: Continue current medical therapy, check reflex TSH and LP(a).  2/25: In the interim the patient had a lipid panel which showed an LDL of 60 however his LP(a) was quite elevated.  Zetia  was started and his LDL is now down to 39 when checked in September.  Patient is doing well.  He still develops stable angina after walking about a mile at the [facility].  This can be aborted if he takes it as needed nitroglycerin  before he exerts himself.  He would like to start Imdur  because of this.  He has not required nitroglycerin  at any other times.  He has not had chest pain at rest.  This only occurs after he has been exerting himself and walking for about a mile.  He has had no issues with his atorvastatin .  He has had some issues with some postnasal drip specially in the morning.  He used to have seasonal allergies but is not sure about this now.  He denies any severe bleeding or myalgias or arthralgias in response to Lipitor .  He has not required any emergency room visits or hospitalizations.  He is otherwise well and without significant complaints today.  Plan start  Imdur  30 mg daily.  Check lipid panel, increase enalapril to 20 mg.  April 2025:  Patient consents to use of AI scribe. The patient was started on Imdur  but did not experience any benefit in terms of decrease in anginal symptoms.  His LDL was 33.  He tells me that he can walk a few laps and then he develops chest discomfort.  He will take a nitroglycerin  and this will go away.  Sometimes he can walk 4 laps before this happens.  1 time last week she had an episode of chest pain at night.  He went to the bathroom and came back and he was still having this episode but then it resolved on its own.  He does not routinely get chest pain with any level of exertion or any specific level of exertion however it is occurred quite frequently where he  needs to stop and take a nitroglycerin .  He is typically a pretty active person and likes to exercise on a regular basis.  He tells me that this is affected his quality of life.  Plan: Refer for coronary angiography.  June 2025:  Patient consents to use of AI scribe. In the interim the patient had coronary angiography which demonstrated left main and two-vessel disease.  He was seen in cardiology clinic in May following his procedure and was recovering well.  He was seen recently in cardiothoracic surgical division and again was doing well and walking until 1 and half miles a day without chest pain or shortness of breath.  The patient is doing very well.  He has lost weight and denies any exertional anginal symptoms.  He feels much better.  His sternum is healing well.  He denies any bleeding or bruising, signs or symptoms of stroke, or severe shortness of breath.  He is very pleased with the results of his treatment.            Current Medications: Current Meds  Medication Sig   acetaminophen  (TYLENOL ) 325 MG tablet Take 2 tablets (650 mg total) by mouth every 6 (six) hours as needed.   aspirin  EC 325 MG tablet Take 1 tablet (325 mg total) by mouth daily.    atorvastatin  (LIPITOR ) 80 MG tablet TAKE 1 TABLET BY MOUTH ONCE  DAILY   ezetimibe  (ZETIA ) 10 MG tablet TAKE 1 TABLET BY MOUTH DAILY   omeprazole (PRILOSEC) 20 MG capsule Take 20 mg by mouth daily.   ramipril  (ALTACE ) 10 MG capsule Take 1 capsule (10 mg total) by mouth daily.   sertraline  (ZOLOFT ) 50 MG tablet TAKE 1 TABLET BY MOUTH DAILY   [DISCONTINUED] metoprolol  tartrate (LOPRESSOR ) 50 MG tablet Take 1.5 tablets (75 mg total) by mouth 2 (two) times daily.   [DISCONTINUED] ramipril  (ALTACE ) 5 MG capsule Take 1 capsule (5 mg total) by mouth daily.     Review of Systems:   Please see the history of present illness.    All other systems reviewed and are negative.     EKGs/Labs/Other Test Reviewed:   EKG: EKG from May 2025 demonstrates sinus rhythm with nonspecific T wave abnormality  EKG Interpretation Date/Time:    Ventricular Rate:    PR Interval:    QRS Duration:    QT Interval:    QTC Calculation:   R Axis:      Text Interpretation:           Risk Assessment/Calculations:          Physical Exam:   VS:  BP 130/85   Pulse (!) 56   Ht 5' 7.5" (1.715 m)   Wt 197 lb (89.4 kg)   SpO2 94%   BMI 30.40 kg/m        Wt Readings from Last 3 Encounters:  07/22/23 197 lb (89.4 kg)  07/15/23 195 lb (88.5 kg)  07/13/23 200 lb (90.7 kg)      GENERAL:  No apparent distress, AOx3 HEENT:  No carotid bruits, +2 carotid impulses, no scleral icterus CAR: RRR  no murmurs, gallops, rubs, or thrills RES:  Clear to auscultation bilaterally ABD:  Soft, nontender, nondistended, positive bowel sounds x 4 VASC:  +2 radial pulses, +2 carotid pulses NEURO:  CN 2-12 grossly intact; motor and sensory grossly intact PSYCH:  No active depression or anxiety EXT:  No edema, ecchymosis, or cyanosis  Signed, Abcde Oneil K Katniss Weedman, MD  07/22/2023 11:41 AM  [facility] [address] [place], [place], [place]  [address] Phone: [phone]; Fax: [phone]   Note:  This  document was prepared using Dragon voice recognition software and may include unintentional dictation errors.

## 2023-07-15 ENCOUNTER — Telehealth (HOSPITAL_COMMUNITY): Payer: Self-pay

## 2023-07-15 ENCOUNTER — Other Ambulatory Visit: Payer: Self-pay

## 2023-07-15 NOTE — Telephone Encounter (Addendum)
 Pt called wanting to schedule for cardiac rehab, (per nurse navigator, schedule pt, and if she needs to she will create pt a new yellow sheet) pt will come in for orientation on 6/9@1030  and will attend the 10:15 exercise class time.   Sent packet

## 2023-07-15 NOTE — Transitions of Care (Post Inpatient/ED Visit) (Signed)
 Transition of Care Week #5 [facility] program closure  Visit Note  07/15/2023  Name: Tim Walters MRN: [id number]          DOB: August 22, 1945  Situation: Patient enrolled in [facility] 30-day program. Visit completed with patient by telephone.   Background: Patient discharged from [facility] 06/15/23 s/p CABGx4 06/08/23. Patient confirms seeing PCP 07/04/23 as planned/record reviewed from visit note - Patient saw Dr Sherene Dilling as planned 07/13/23 and stats he was cleared to start cardiac rehab. Patient feels he has met his goals and is doing well and denied any need for additional TOC calls. Patient thanked [facility] RN for the previous calls and the support. Patient declined referral to CCM.   Initial Transition Care Management Follow-up Telephone Call    Past Medical History:  Diagnosis Date   Anginal pain (HCC)    Arthritis    lt ankle   Basal cell carcinoma    x2 (excised)   Breast lump    Per records from [facility]    Colon polyp    Coronary artery disease    a. BMS to RCA 2003 with residual LAD/diag disease treated medically, normal EF.   COVID    Depression    Diverticulosis    Per records from [facility]    GERD (gastroesophageal reflux disease)    Hyperkalemia    a. K of 5.2 in 2017.   Hyperlipidemia    Hypertension    Idiopathic peripheral neuropathy    Per records from [facility]    Impaired fasting glucose    Per records from [facility]    Kidney stones    Monoclonal gammopathy of undetermined significance    Per records from [facility]    Myocardial infarction [facility]) 11/15/2001   Dr. Kay Parson [facility])   OSA (obstructive sleep apnea)    Per [facility] New Patient Packet   Peripheral neuropathy    Left Foot, Per [facility] New Patient Packet   Pre-diabetes    Sleep apnea    had test several yr ago-said he did not need a cpap-still snores   Torn rotator cuff    Per [facility] New Patient Packet   Venous insufficiency of left leg    Per [facility] New Patient  Packet   Wears glasses     Assessment: Patient Reported Symptoms: Cognitive Cognitive Status: Alert and oriented to person, place, and time, Normal speech and language skills Cognitive/Intellectual Conditions Management [RPT]: None reported or documented in medical history or problem list      Neurological Neurological Review of Symptoms: No symptoms reported    HEENT HEENT Symptoms Reported: No symptoms reported      Cardiovascular Cardiovascular Symptoms Reported: No symptoms reported Cardiovascular Conditions: Hypertension, Coronary artery disease Cardiovascular Management Strategies: Medication therapy, Diet modification Weight: 195 lb (88.5 kg)  Respiratory Respiratory Symptoms Reported: No symptoms reported    Endocrine Patient reports the following symptoms related to hypoglycemia or hyperglycemia : No symptoms reported    Gastrointestinal Gastrointestinal Symptoms Reported: No symptoms reported      Genitourinary Genitourinary Symptoms Reported: No symptoms reported    Integumentary Integumentary Symptoms Reported: No symptoms reported Additional Integumentary Details: Patient was seen 07/13/23 by cardiothoracic surgeron Dr Sherene Dilling and has been cleared to start cardiac rehab - states all incisions look good    Musculoskeletal Musculoskelatal Symptoms Reviewed: No symptoms reported   Falls in the past year?: No Number of falls in past year: 1 or less    Psychosocial  There were no vitals filed for this visit.  Medications Reviewed Today     Reviewed by Sharmaine Dearth, RN (Registered Nurse) on 07/15/23 at 1509  Med List Status: <None>   Medication Order Taking? Sig Documenting Provider Last Dose Status Informant  acetaminophen  (TYLENOL ) 325 MG tablet [id number] Yes Take 2 tablets (650 mg total) by mouth every 6 (six) hours as needed. Randa Burton, PA-C Taking Active   aspirin  EC 325 MG tablet [id number] Yes Take 1 tablet (325 mg total) by mouth  daily. Randa Burton, PA-C Taking Active   atorvastatin  (LIPITOR ) 80 MG tablet [id number] Yes TAKE 1 TABLET BY MOUTH ONCE  DAILY Eubanks, Jessica K, NP Taking Active Self  ezetimibe  (ZETIA ) 10 MG tablet [id number] Yes TAKE 1 TABLET BY MOUTH DAILY Thukkani, Arun K, MD Taking Active Self  metoprolol  tartrate (LOPRESSOR ) 50 MG tablet [id number] Yes Take 1.5 tablets (75 mg total) by mouth 2 (two) times daily. Leala Prince, PA-C Taking Active   omeprazole (PRILOSEC) 20 MG capsule [id number] Yes Take 20 mg by mouth daily. [provider] Taking Active Self  ramipril  (ALTACE ) 5 MG capsule [id number] Yes Take 1 capsule (5 mg total) by mouth daily. Leala Prince, PA-C Taking Active   sertraline  (ZOLOFT ) 50 MG tablet [id number] Yes TAKE 1 TABLET BY MOUTH DAILY Roselie Conger Jessica K, NP Taking Active Self            Recommendation:   Patient will begin cardiac rehab  Follow Up Plan:   Closing From:  Transitions of Care Program  Tonia Frankel RN, CCM [facility]  VBCI-Population Health RN Care Manager [phone]

## 2023-07-21 ENCOUNTER — Ambulatory Visit: Admitting: Internal Medicine

## 2023-07-22 ENCOUNTER — Ambulatory Visit: Attending: Internal Medicine | Admitting: Internal Medicine

## 2023-07-22 ENCOUNTER — Encounter: Payer: Self-pay | Admitting: Internal Medicine

## 2023-07-22 ENCOUNTER — Telehealth (HOSPITAL_COMMUNITY): Payer: Self-pay

## 2023-07-22 VITALS — BP 130/85 | HR 56 | Ht 67.5 in | Wt 197.0 lb

## 2023-07-22 DIAGNOSIS — N182 Chronic kidney disease, stage 2 (mild): Secondary | ICD-10-CM

## 2023-07-22 DIAGNOSIS — I700 Atherosclerosis of aorta: Secondary | ICD-10-CM | POA: Diagnosis not present

## 2023-07-22 DIAGNOSIS — R7303 Prediabetes: Secondary | ICD-10-CM | POA: Diagnosis not present

## 2023-07-22 DIAGNOSIS — E785 Hyperlipidemia, unspecified: Secondary | ICD-10-CM | POA: Diagnosis not present

## 2023-07-22 DIAGNOSIS — Z6832 Body mass index (BMI) 32.0-32.9, adult: Secondary | ICD-10-CM

## 2023-07-22 DIAGNOSIS — I10 Essential (primary) hypertension: Secondary | ICD-10-CM | POA: Diagnosis not present

## 2023-07-22 DIAGNOSIS — E7841 Elevated Lipoprotein(a): Secondary | ICD-10-CM

## 2023-07-22 DIAGNOSIS — Z951 Presence of aortocoronary bypass graft: Secondary | ICD-10-CM | POA: Diagnosis not present

## 2023-07-22 MED ORDER — RAMIPRIL 10 MG PO CAPS: 10.0000 mg | ORAL_CAPSULE | Freq: Every day | ORAL | 3 refills | Status: DC

## 2023-07-22 NOTE — Patient Instructions (Signed)
 Medication Instructions:  Your physician has recommended you make the following change in your medication:  1.) stop metoprolol  2.) increase ramipril  to 10 mg - one tablet daily  *If you need a refill on your cardiac medications before your next appointment, please call your pharmacy*  Lab Work: In about one week - [facility] for blood work (bmet)   Testing/Procedures none  Follow-Up: At [facility], you and your health needs are our priority.  As part of our continuing mission to provide you with exceptional heart care, our providers are all part of one team.  This team includes your primary Cardiologist (physician) and Advanced Practice Providers or APPs (Physician Assistants and Nurse Practitioners) who all work together to provide you with the care you need, when you need it.  Your next appointment:   6 month(s)  Provider:   Arun K Thukkani, MD

## 2023-07-22 NOTE — Telephone Encounter (Signed)
  Called pt to confirm appt for 07/25/23 at 1030. Gave pt instructions for appt, what to wear, office address, eating/taking meds before, and if sick to call and reschedule. Pt voiced understanding, all questions answered.   Health history completed? Yes   Arlander Labrum, MS, ACSM-CEP 07/22/2023 2:31 PM

## 2023-07-22 NOTE — Telephone Encounter (Signed)
 Called pt to confirm appt for 07/25/23, no answer. Left message.   Arlander Labrum, MS, ACSM-CEP 07/22/2023 11:01 AM

## 2023-07-25 ENCOUNTER — Encounter: Payer: Self-pay | Admitting: Internal Medicine

## 2023-07-25 ENCOUNTER — Telehealth: Payer: Self-pay | Admitting: Internal Medicine

## 2023-07-25 ENCOUNTER — Encounter (HOSPITAL_COMMUNITY)
Admission: RE | Admit: 2023-07-25 | Discharge: 2023-07-25 | Disposition: A | Discharge status: Home / Self Care | Source: Ambulatory Visit | Attending: Internal Medicine | Admitting: Internal Medicine

## 2023-07-25 VITALS — BP 118/58 | HR 58 | Ht 67.5 in | Wt 200.4 lb

## 2023-07-25 DIAGNOSIS — Z951 Presence of aortocoronary bypass graft: Secondary | ICD-10-CM | POA: Insufficient documentation

## 2023-07-25 NOTE — Telephone Encounter (Signed)
 Pt requesting a c/b in regards to his MyChart message.

## 2023-07-25 NOTE — Telephone Encounter (Signed)
Called pt. See telephone note.

## 2023-07-25 NOTE — Progress Notes (Signed)
 Cardiac Individual Treatment Plan  Patient Details  Name: Tim Walters MRN: [id number] Date of Birth: 05-16-1945 Referring Provider:   Flowsheet Row INTENSIVE CARDIAC REHAB ORIENT from 07/25/2023 in [facility]  Referring Provider [name], MD       Initial Encounter Date:  Flowsheet Row INTENSIVE CARDIAC REHAB ORIENT from 07/25/2023 in [facility] for Heart, Vascular, [facility]  Date 07/25/23       Visit Diagnosis: S/P CABG x 4  Patient's Home Medications on Admission:  Current Outpatient Medications:    acetaminophen  (TYLENOL ) 325 MG tablet, Take 2 tablets (650 mg total) by mouth every 6 (six) hours as needed., Disp: , Rfl:    aspirin  EC 325 MG tablet, Take 1 tablet (325 mg total) by mouth daily. (Patient taking differently: Take 81 mg by mouth daily.), Disp: , Rfl:    atorvastatin  (LIPITOR ) 80 MG tablet, TAKE 1 TABLET BY MOUTH ONCE  DAILY, Disp: 90 tablet, Rfl: 3   ezetimibe  (ZETIA ) 10 MG tablet, TAKE 1 TABLET BY MOUTH DAILY, Disp: 90 tablet, Rfl: 3   omeprazole (PRILOSEC) 20 MG capsule, Take 20 mg by mouth daily., Disp: , Rfl:    ramipril  (ALTACE ) 10 MG capsule, Take 1 capsule (10 mg total) by mouth daily., Disp: 90 capsule, Rfl: 3   sertraline  (ZOLOFT ) 50 MG tablet, TAKE 1 TABLET BY MOUTH DAILY, Disp: 90 tablet, Rfl: 3 No current facility-administered medications for this encounter.  Facility-Administered Medications Ordered in Other Encounters:    regadenoson  (LEXISCAN ) injection SOLN 0.4 mg, 0.4 mg, Intravenous, Once, Valentina Gasman, Karon Packer., NP  Past Medical History: Past Medical History:  Diagnosis Date   Anginal pain (HCC)    Arthritis    lt ankle   Basal cell carcinoma    x2 (excised)   Breast lump    Per records from [facility]    Colon polyp    Coronary artery disease    a. BMS to RCA 2003 with residual LAD/diag disease treated medically, normal EF.   COVID     Depression    Diverticulosis    Per records from [facility]    GERD (gastroesophageal reflux disease)    Hyperkalemia    a. K of 5.2 in 2017.   Hyperlipidemia    Hypertension    Idiopathic peripheral neuropathy    Per records from [facility]    Impaired fasting glucose    Per records from [facility]    Kidney stones    Monoclonal gammopathy of undetermined significance    Per records from [facility]    Myocardial infarction [facility]) 11/15/2001   Dr. Kay Parson [facility])   OSA (obstructive sleep apnea)    Per [facility] New Patient Packet   Peripheral neuropathy    Left Foot, Per [facility] New Patient Packet   Pre-diabetes    Sleep apnea    had test several yr ago-said he did not need a cpap-still snores   Torn rotator cuff    Per [facility] New Patient Packet   Venous insufficiency of left leg    Per [facility] New Patient Packet   Wears glasses     Tobacco Use: Social History   Tobacco Use  Smoking Status Former   Current packs/day: 0.00   Types: Cigarettes   Start date: 02/15/1962   Quit date: 02/15/1974   Years since quitting: 49.4  Smokeless Tobacco Never    Labs: Review  Flowsheet  More data exists      Latest Ref Rng & Units 11/09/2022 04/20/2023 05/17/2023 06/07/2023 06/08/2023  Labs for ITP Cardiac and Pulmonary Rehab  Cholestrol <200 mg/dL - 098  119  - -  LDL (calc) mg/dL (calc) - 41  33  - -  HDL-C > OR = 40 mg/dL - 54  61  - -  Trlycerides <150 mg/dL - 41  49  - -  Hemoglobin A1c 4.8 - 5.6 % 6.3  - 6.5  6.0  -  PH, Arterial 7.35 - 7.45 - - - - 7.319  7.357  7.383  7.411  7.409  7.358  7.324   PCO2 arterial 32 - 48 mmHg - - - - 41.0  38.9  39.2  35.9  35.5  38.7  45.0   Bicarbonate 20.0 - 28.0 mmol/L - - - - 21.0  21.8  23.5  22.8  22.5  24.7  21.7  23.4   TCO2 22 - 32 mmol/L - - - - 22  23  25  24  24  24  25  26  23  25  25  26    Acid-base deficit 0.0 - 2.0 mmol/L - - - - 5.0  3.0  2.0  2.0  2.0  1.0  3.0  3.0   O2 Saturation % - - - - 94  96  93  99   100  86  100  100     Details       Multiple values from one day are sorted in reverse-chronological order         Capillary Blood Glucose: Lab Results  Component Value Date   GLUCAP 106 (H) 06/10/2023   GLUCAP 138 (H) 06/10/2023   GLUCAP 108 (H) 06/10/2023   GLUCAP 143 (H) 06/10/2023   GLUCAP 129 (H) 06/10/2023     Exercise Target Goals: Exercise Program Goal: Individual exercise prescription set using results from initial 6 min walk test and THRR while considering  patient's activity barriers and safety.   Exercise Prescription Goal: Initial exercise prescription builds to 30-45 minutes a day of aerobic activity, 2-3 days per week.  Home exercise guidelines will be given to patient during program as part of exercise prescription that the participant will acknowledge.  Activity Barriers & Risk Stratification:  Activity Barriers & Cardiac Risk Stratification - 07/25/23 1127       Activity Barriers & Cardiac Risk Stratification   Activity Barriers Other (comment);Arthritis;Joint Problems;Back Problems    Comments sternal precautions    Cardiac Risk Stratification High   <5 METs on            6 Minute Walk:  6 Minute Walk     Row Name 07/25/23 1324         6 Minute Walk   Phase Initial     Distance 1466 feet     Walk Time 6 minutes     # of Rest Breaks 0     MPH 2.78     METS 2.55     RPE 11     Perceived Dyspnea  0     VO2 Peak 8.93     Symptoms No     Resting HR 52 bpm     Resting BP 118/58     Resting Oxygen Saturation  96 %     Exercise Oxygen Saturation  during 6 min walk 96 %     Max Ex. HR 94 bpm  Max Ex. BP 146/66     2 Minute Post BP 122/70              Oxygen Initial Assessment:   Oxygen Re-Evaluation:   Oxygen Discharge (Final Oxygen Re-Evaluation):   Initial Exercise Prescription:  Initial Exercise Prescription - 07/25/23 1300       Date of Initial Exercise RX and Referring Provider   Date 07/25/23     Referring Provider [name], MD    Expected Discharge Date 10/19/23      Recumbant Elliptical   Level 1    RPM 50    Watts 80    Minutes 15    METs 2.2      Track   Laps 16    Minutes 15    METs 2      Prescription Details   Frequency (times per week) 3    Duration Progress to 30 minutes of continuous aerobic without signs/symptoms of physical distress      Intensity   THRR 40-80% of Max Heartrate 56-113    Ratings of Perceived Exertion 11-13    Perceived Dyspnea 0-4      Progression   Progression Continue progressive overload as per policy without signs/symptoms or physical distress.      Resistance Training   Training Prescription Yes    Weight 3    Reps 10-15             Perform Capillary Blood Glucose checks as needed.  Exercise Prescription Changes:   Exercise Comments:   Exercise Goals and Review:   Exercise Goals     Row Name 07/25/23 1130             Exercise Goals   Increase Physical Activity Yes       Intervention Provide advice, education, support and counseling about physical activity/exercise needs.;Develop an individualized exercise prescription for aerobic and resistive training based on initial evaluation findings, risk stratification, comorbidities and participant's personal goals.       Expected Outcomes Short Term: Attend rehab on a regular basis to increase amount of physical activity.;Long Term: Exercising regularly at least 3-5 days a week.;Long Term: Add in home exercise to make exercise part of routine and to increase amount of physical activity.       Increase Strength and Stamina Yes       Intervention Provide advice, education, support and counseling about physical activity/exercise needs.;Develop an individualized exercise prescription for aerobic and resistive training based on initial evaluation findings, risk stratification, comorbidities and participant's personal goals.       Expected Outcomes Short Term: Increase  workloads from initial exercise prescription for resistance, speed, and METs.;Short Term: Perform resistance training exercises routinely during rehab and add in resistance training at home;Long Term: Improve cardiorespiratory fitness, muscular endurance and strength as measured by increased METs and functional capacity ( )       Able to understand and use rate of perceived exertion (RPE) scale Yes       Intervention Provide education and explanation on how to use RPE scale       Expected Outcomes Short Term: Able to use RPE daily in rehab to express subjective intensity level;Long Term:  Able to use RPE to guide intensity level when exercising independently       Knowledge and understanding of Target Heart Rate Range (THRR) Yes       Intervention Provide education and explanation of THRR including how the numbers were predicted and where they  are located for reference       Expected Outcomes Short Term: Able to state/look up THRR;Short Term: Able to use daily as guideline for intensity in rehab;Long Term: Able to use THRR to govern intensity when exercising independently       Understanding of Exercise Prescription Yes       Intervention Provide education, explanation, and written materials on patient's individual exercise prescription       Expected Outcomes Short Term: Able to explain program exercise prescription;Long Term: Able to explain home exercise prescription to exercise independently                Exercise Goals Re-Evaluation :   Discharge Exercise Prescription (Final Exercise Prescription Changes):   Nutrition:  Target Goals: Understanding of nutrition guidelines, daily intake of sodium 1500mg , cholesterol 200mg , calories 30% from fat and 7% or less from saturated fats, daily to have 5 or more servings of fruits and vegetables.  Biometrics:  Pre Biometrics - 07/25/23 1124       Pre Biometrics   Waist Circumference 45 inches    Hip Circumference 41 inches    Waist  to Hip Ratio 1.1 %    Triceps Skinfold 22 mm    % Body Fat 33.1 %    Grip Strength 28 kg    Flexibility --   not done- low back pain   Single Leg Stand 5.25 seconds              Nutrition Therapy Plan and Nutrition Goals:   Nutrition Assessments:  MEDIFICTS Score Key: >=70 Need to make dietary changes  40-70 Heart Healthy Diet <= 40 Therapeutic Level Cholesterol Diet    Picture Your Plate Scores: <16 Unhealthy dietary pattern with much room for improvement. 41-50 Dietary pattern unlikely to meet recommendations for good health and room for improvement. 51-60 More healthful dietary pattern, with some room for improvement.  >60 Healthy dietary pattern, although there may be some specific behaviors that could be improved.    Nutrition Goals Re-Evaluation:   Nutrition Goals Re-Evaluation:   Nutrition Goals Discharge (Final Nutrition Goals Re-Evaluation):   Psychosocial: Target Goals: Acknowledge presence or absence of significant depression and/or stress, maximize coping skills, provide positive support system. Participant is able to verbalize types and ability to use techniques and skills needed for reducing stress and depression.  Initial Review & Psychosocial Screening:  Initial Psych Review & Screening - 07/25/23 1130       Initial Review   Current issues with History of Depression      Family Dynamics   Good Support System? Yes   wife   Comments Ramzi has a history of depression but is in remission and feels Zoloft  is helping. No current concerns or need for additional resources.      Barriers   Psychosocial barriers to participate in program There are no identifiable barriers or psychosocial needs.      Screening Interventions   Interventions Encouraged to exercise;Provide feedback about the scores to participant    Expected Outcomes Long Term goal: The participant improves quality of Life and PHQ9 Scores as seen by post scores and/or verbalization of  changes;Short Term goal: Identification and review with participant of any Quality of Life or Depression concerns found by scoring the questionnaire.             Quality of Life Scores:  Quality of Life - 07/25/23 1330       Quality of Life   Select Quality  of Life      Quality of Life Scores   Health/Function Pre 25.33 %    Socioeconomic Pre 26.43 %    Psych/Spiritual Pre 24.67 %    Family Pre 21.6 %    GLOBAL Pre 24.88 %            Scores of 19 and below usually indicate a poorer quality of life in these areas.  A difference of  2-3 points is a clinically meaningful difference.  A difference of 2-3 points in the total score of the Quality of Life Index has been associated with significant improvement in overall quality of life, self-image, physical symptoms, and general health in studies assessing change in quality of life.  PHQ-9: Review Flowsheet  More data exists      07/25/2023 07/04/2023 07/01/2023 06/24/2023 06/21/2023  Depression screen PHQ 2/9  Decreased Interest 0 0 0 0 0  Down, Depressed, Hopeless 0 0 0 0 0  PHQ - 2 Score 0 0 0 0 0  Altered sleeping 0 0 - - -  Tired, decreased energy 1 2 - - -  Change in appetite 0 0 - - -  Feeling bad or failure about yourself  0 0 - - -  Trouble concentrating 0 0 - - -  Moving slowly or fidgety/restless 0 0 - - -  Suicidal thoughts 0 0 - - -  PHQ-9 Score 1 2 - - -  Difficult doing work/chores Somewhat difficult Not difficult at all - - -   Interpretation of Total Score  Total Score Depression Severity:  1-4 = Minimal depression, 5-9 = Mild depression, 10-14 = Moderate depression, 15-19 = Moderately severe depression, 20-27 = Severe depression   Psychosocial Evaluation and Intervention:   Psychosocial Re-Evaluation:   Psychosocial Discharge (Final Psychosocial Re-Evaluation):   Vocational Rehabilitation: Provide vocational rehab assistance to qualifying candidates.   Vocational Rehab Evaluation & Intervention:   Vocational Rehab - 07/25/23 1131       Initial Vocational Rehab Evaluation & Intervention   Assessment shows need for Vocational Rehabilitation No   retired            Education: Education Goals: Education classes will be provided on a weekly basis, covering required topics. Participant will state understanding/return demonstration of topics presented.     Core Videos: Exercise    Move It!  Clinical staff conducted group or individual video education with verbal and written material and guidebook.  Patient learns the recommended Pritikin exercise program. Exercise with the goal of living a long, healthy life. Some of the health benefits of exercise include controlled diabetes, healthier blood pressure levels, improved cholesterol levels, improved heart and lung capacity, improved sleep, and better body composition. Everyone should speak with their doctor before starting or changing an exercise routine.  Biomechanical Limitations Clinical staff conducted group or individual video education with verbal and written material and guidebook.  Patient learns how biomechanical limitations can impact exercise and how we can mitigate and possibly overcome limitations to have an impactful and balanced exercise routine.  Body Composition Clinical staff conducted group or individual video education with verbal and written material and guidebook.  Patient learns that body composition (ratio of muscle mass to fat mass) is a key component to assessing overall fitness, rather than body weight alone. Increased fat mass, especially visceral belly fat, can put us  at increased risk for metabolic syndrome, type 2 diabetes, heart disease, and even death. It is recommended to combine diet and exercise (  cardiovascular and resistance training) to improve your body composition. Seek guidance from your physician and exercise physiologist before implementing an exercise routine.  Exercise Action Plan Clinical  staff conducted group or individual video education with verbal and written material and guidebook.  Patient learns the recommended strategies to achieve and enjoy long-term exercise adherence, including variety, self-motivation, self-efficacy, and positive decision making. Benefits of exercise include fitness, good health, weight management, more energy, better sleep, less stress, and overall well-being.  Medical   Heart Disease Risk Reduction Clinical staff conducted group or individual video education with verbal and written material and guidebook.  Patient learns our heart is our most vital organ as it circulates oxygen, nutrients, white blood cells, and hormones throughout the entire body, and carries waste away. Data supports a plant-based eating plan like the Pritikin Program for its effectiveness in slowing progression of and reversing heart disease. The video provides a number of recommendations to address heart disease.   Metabolic Syndrome and Belly Fat  Clinical staff conducted group or individual video education with verbal and written material and guidebook.  Patient learns what metabolic syndrome is, how it leads to heart disease, and how one can reverse it and keep it from coming back. You have metabolic syndrome if you have 3 of the following 5 criteria: abdominal obesity, high blood pressure, high triglycerides, low HDL cholesterol, and high blood sugar.  Hypertension and Heart Disease Clinical staff conducted group or individual video education with verbal and written material and guidebook.  Patient learns that high blood pressure, or hypertension, is very common in the [place] . Hypertension is largely due to excessive salt intake, but other important risk factors include being overweight, physical inactivity, drinking too much alcohol, smoking, and not eating enough potassium from fruits and vegetables. High blood pressure is a leading risk factor for heart attack, stroke,  congestive heart failure, dementia, kidney failure, and premature death. Long-term effects of excessive salt intake include stiffening of the arteries and thickening of heart muscle and organ damage. Recommendations include ways to reduce hypertension and the risk of heart disease.  Diseases of Our Time - Focusing on Diabetes Clinical staff conducted group or individual video education with verbal and written material and guidebook.  Patient learns why the best way to stop diseases of our time is prevention, through food and other lifestyle changes. Medicine (such as prescription pills and surgeries) is often only a Band-Aid on the problem, not a long-term solution. Most common diseases of our time include obesity, type 2 diabetes, hypertension, heart disease, and cancer. The Pritikin Program is recommended and has been proven to help reduce, reverse, and/or prevent the damaging effects of metabolic syndrome.  Nutrition   Overview of the Pritikin Eating Plan  Clinical staff conducted group or individual video education with verbal and written material and guidebook.  Patient learns about the Pritikin Eating Plan for disease risk reduction. The Pritikin Eating Plan emphasizes a wide variety of unrefined, minimally-processed carbohydrates, like fruits, vegetables, whole grains, and legumes. Go, Caution, and Stop food choices are explained. Plant-based and lean animal proteins are emphasized. Rationale provided for low sodium intake for blood pressure control, low added sugars for blood sugar stabilization, and low added fats and oils for coronary artery disease risk reduction and weight management.  Calorie Density  Clinical staff conducted group or individual video education with verbal and written material and guidebook.  Patient learns about calorie density and how it impacts the Pritikin Eating Plan. Knowing the  characteristics of the food you choose will help you decide whether those foods will lead  to weight gain or weight loss, and whether you want to consume more or less of them. Weight loss is usually a side effect of the Pritikin Eating Plan because of its focus on low calorie-dense foods.  Label Reading  Clinical staff conducted group or individual video education with verbal and written material and guidebook.  Patient learns about the Pritikin recommended label reading guidelines and corresponding recommendations regarding calorie density, added sugars, sodium content, and whole grains.  Dining Out - Part 1  Clinical staff conducted group or individual video education with verbal and written material and guidebook.  Patient learns that restaurant meals can be sabotaging because they can be so high in calories, fat, sodium, and/or sugar. Patient learns recommended strategies on how to positively address this and avoid unhealthy pitfalls.  Facts on Fats  Clinical staff conducted group or individual video education with verbal and written material and guidebook.  Patient learns that lifestyle modifications can be just as effective, if not more so, as many medications for lowering your risk of heart disease. A Pritikin lifestyle can help to reduce your risk of inflammation and atherosclerosis (cholesterol build-up, or plaque, in the artery walls). Lifestyle interventions such as dietary choices and physical activity address the cause of atherosclerosis. A review of the types of fats and their impact on blood cholesterol levels, along with dietary recommendations to reduce fat intake is also included.  Nutrition Action Plan  Clinical staff conducted group or individual video education with verbal and written material and guidebook.  Patient learns how to incorporate Pritikin recommendations into their lifestyle. Recommendations include planning and keeping personal health goals in mind as an important part of their success.  Healthy Mind-Set    Healthy Minds, Bodies, Hearts  Clinical  staff conducted group or individual video education with verbal and written material and guidebook.  Patient learns how to identify when they are stressed. Video will discuss the impact of that stress, as well as the many benefits of stress management. Patient will also be introduced to stress management techniques. The way we think, act, and feel has an impact on our hearts.  How Our Thoughts Can Heal Our Hearts  Clinical staff conducted group or individual video education with verbal and written material and guidebook.  Patient learns that negative thoughts can cause depression and anxiety. This can result in negative lifestyle behavior and serious health problems. Cognitive behavioral therapy is an effective method to help control our thoughts in order to change and improve our emotional outlook.  Additional Videos:  Exercise    Improving Performance  Clinical staff conducted group or individual video education with verbal and written material and guidebook.  Patient learns to use a non-linear approach by alternating intensity levels and lengths of time spent exercising to help burn more calories and lose more body fat. Cardiovascular exercise helps improve heart health, metabolism, hormonal balance, blood sugar control, and recovery from fatigue. Resistance training improves strength, endurance, balance, coordination, reaction time, metabolism, and muscle mass. Flexibility exercise improves circulation, posture, and balance. Seek guidance from your physician and exercise physiologist before implementing an exercise routine and learn your capabilities and proper form for all exercise.  Introduction to Yoga  Clinical staff conducted group or individual video education with verbal and written material and guidebook.  Patient learns about yoga, a discipline of the coming together of mind, breath, and body. The benefits of  yoga include improved flexibility, improved range of motion, better posture and  core strength, increased lung function, weight loss, and positive self-image. Yoga's heart health benefits include lowered blood pressure, healthier heart rate, decreased cholesterol and triglyceride levels, improved immune function, and reduced stress. Seek guidance from your physician and exercise physiologist before implementing an exercise routine and learn your capabilities and proper form for all exercise.  Medical   Aging: Enhancing Your Quality of Life  Clinical staff conducted group or individual video education with verbal and written material and guidebook.  Patient learns key strategies and recommendations to stay in good physical health and enhance quality of life, such as prevention strategies, having an advocate, securing a Health Care Proxy and Power of Attorney, and keeping a list of medications and system for tracking them. It also discusses how to avoid risk for bone loss.  Biology of Weight Control  Clinical staff conducted group or individual video education with verbal and written material and guidebook.  Patient learns that weight gain occurs because we consume more calories than we burn (eating more, moving less). Even if your body weight is normal, you may have higher ratios of fat compared to muscle mass. Too much body fat puts you at increased risk for cardiovascular disease, heart attack, stroke, type 2 diabetes, and obesity-related cancers. In addition to exercise, following the Pritikin Eating Plan can help reduce your risk.  Decoding Lab Results  Clinical staff conducted group or individual video education with verbal and written material and guidebook.  Patient learns that lab test reflects one measurement whose values change over time and are influenced by many factors, including medication, stress, sleep, exercise, food, hydration, pre-existing medical conditions, and more. It is recommended to use the knowledge from this video to become more involved with your lab  results and evaluate your numbers to speak with your doctor.   Diseases of Our Time - Overview  Clinical staff conducted group or individual video education with verbal and written material and guidebook.  Patient learns that according to the CDC, 50% to 70% of chronic diseases (such as obesity, type 2 diabetes, elevated lipids, hypertension, and heart disease) are avoidable through lifestyle improvements including healthier food choices, listening to satiety cues, and increased physical activity.  Sleep Disorders Clinical staff conducted group or individual video education with verbal and written material and guidebook.  Patient learns how good quality and duration of sleep are important to overall health and well-being. Patient also learns about sleep disorders and how they impact health along with recommendations to address them, including discussing with a physician.  Nutrition  Dining Out - Part 2 Clinical staff conducted group or individual video education with verbal and written material and guidebook.  Patient learns how to plan ahead and communicate in order to maximize their dining experience in a healthy and nutritious manner. Included are recommended food choices based on the type of restaurant the patient is visiting.   Fueling a Banker conducted group or individual video education with verbal and written material and guidebook.  There is a strong connection between our food choices and our health. Diseases like obesity and type 2 diabetes are very prevalent and are in large-part due to lifestyle choices. The Pritikin Eating Plan provides plenty of food and hunger-curbing satisfaction. It is easy to follow, affordable, and helps reduce health risks.  Menu Workshop  Clinical staff conducted group or individual video education with verbal and written material and guidebook.  Patient learns that restaurant meals can sabotage health goals because they are often  packed with calories, fat, sodium, and sugar. Recommendations include strategies to plan ahead and to communicate with the manager, chef, or server to help order a healthier meal.  Planning Your Eating Strategy  Clinical staff conducted group or individual video education with verbal and written material and guidebook.  Patient learns about the Pritikin Eating Plan and its benefit of reducing the risk of disease. The Pritikin Eating Plan does not focus on calories. Instead, it emphasizes high-quality, nutrient-rich foods. By knowing the characteristics of the foods, we choose, we can determine their calorie density and make informed decisions.  Targeting Your Nutrition Priorities  Clinical staff conducted group or individual video education with verbal and written material and guidebook.  Patient learns that lifestyle habits have a tremendous impact on disease risk and progression. This video provides eating and physical activity recommendations based on your personal health goals, such as reducing LDL cholesterol, losing weight, preventing or controlling type 2 diabetes, and reducing high blood pressure.  Vitamins and Minerals  Clinical staff conducted group or individual video education with verbal and written material and guidebook.  Patient learns different ways to obtain key vitamins and minerals, including through a recommended healthy diet. It is important to discuss all supplements you take with your doctor.   Healthy Mind-Set    Smoking Cessation  Clinical staff conducted group or individual video education with verbal and written material and guidebook.  Patient learns that cigarette smoking and tobacco addiction pose a serious health risk which affects millions of people. Stopping smoking will significantly reduce the risk of heart disease, lung disease, and many forms of cancer. Recommended strategies for quitting are covered, including working with your doctor to develop a successful  plan.  Culinary   Becoming a Set designer conducted group or individual video education with verbal and written material and guidebook.  Patient learns that cooking at home can be healthy, cost-effective, quick, and puts them in control. Keys to cooking healthy recipes will include looking at your recipe, assessing your equipment needs, planning ahead, making it simple, choosing cost-effective seasonal ingredients, and limiting the use of added fats, salts, and sugars.  Cooking - Breakfast and Snacks  Clinical staff conducted group or individual video education with verbal and written material and guidebook.  Patient learns how important breakfast is to satiety and nutrition through the entire day. Recommendations include key foods to eat during breakfast to help stabilize blood sugar levels and to prevent overeating at meals later in the day. Planning ahead is also a key component.  Cooking - Educational psychologist conducted group or individual video education with verbal and written material and guidebook.  Patient learns eating strategies to improve overall health, including an approach to cook more at home. Recommendations include thinking of animal protein as a side on your plate rather than center stage and focusing instead on lower calorie dense options like vegetables, fruits, whole grains, and plant-based proteins, such as beans. Making sauces in large quantities to freeze for later and leaving the skin on your vegetables are also recommended to maximize your experience.  Cooking - Healthy Salads and Dressing Clinical staff conducted group or individual video education with verbal and written material and guidebook.  Patient learns that vegetables, fruits, whole grains, and legumes are the foundations of the Pritikin Eating Plan. Recommendations include how to incorporate each of these in flavorful and  healthy salads, and how to create homemade salad dressings.  Proper handling of ingredients is also covered. Cooking - Soups and [employer] - Soups and Desserts Clinical staff conducted group or individual video education with verbal and written material and guidebook.  Patient learns that Pritikin soups and desserts make for easy, nutritious, and delicious snacks and meal components that are low in sodium, fat, sugar, and calorie density, while high in vitamins, minerals, and filling fiber. Recommendations include simple and healthy ideas for soups and desserts.   Overview     The Pritikin Solution Program Overview Clinical staff conducted group or individual video education with verbal and written material and guidebook.  Patient learns that the results of the Pritikin Program have been documented in more than 100 articles published in peer-reviewed journals, and the benefits include reducing risk factors for (and, in some cases, even reversing) high cholesterol, high blood pressure, type 2 diabetes, obesity, and more! An overview of the three key pillars of the Pritikin Program will be covered: eating well, doing regular exercise, and having a healthy mind-set.  WORKSHOPS  Exercise: Exercise Basics: Building Your Action Plan Clinical staff led group instruction and group discussion with PowerPoint presentation and patient guidebook. To enhance the learning environment the use of posters, models and videos may be added. At the conclusion of this workshop, patients will comprehend the difference between physical activity and exercise, as well as the benefits of incorporating both, into their routine. Patients will understand the FITT (Frequency, Intensity, Time, and Type) principle and how to use it to build an exercise action plan. In addition, safety concerns and other considerations for exercise and cardiac rehab will be addressed by the presenter. The purpose of this lesson is to promote a comprehensive and effective weekly exercise routine in  order to improve patients' overall level of fitness.   Managing Heart Disease: Your Path to a Healthier Heart Clinical staff led group instruction and group discussion with PowerPoint presentation and patient guidebook. To enhance the learning environment the use of posters, models and videos may be added.At the conclusion of this workshop, patients will understand the anatomy and physiology of the heart. Additionally, they will understand how Pritikin's three pillars impact the risk factors, the progression, and the management of heart disease.  The purpose of this lesson is to provide a high-level overview of the heart, heart disease, and how the Pritikin lifestyle positively impacts risk factors.  Exercise Biomechanics Clinical staff led group instruction and group discussion with PowerPoint presentation and patient guidebook. To enhance the learning environment the use of posters, models and videos may be added. Patients will learn how the structural parts of their bodies function and how these functions impact their daily activities, movement, and exercise. Patients will learn how to promote a neutral spine, learn how to manage pain, and identify ways to improve their physical movement in order to promote healthy living. The purpose of this lesson is to expose patients to common physical limitations that impact physical activity. Participants will learn practical ways to adapt and manage aches and pains, and to minimize their effect on regular exercise. Patients will learn how to maintain good posture while sitting, walking, and lifting.  Balance Training and Fall Prevention  Clinical staff led group instruction and group discussion with PowerPoint presentation and patient guidebook. To enhance the learning environment the use of posters, models and videos may be added. At the conclusion of this workshop, patients will understand the importance of their  sensorimotor skills (vision,  proprioception, and the vestibular system) in maintaining their ability to balance as they age. Patients will apply a variety of balancing exercises that are appropriate for their current level of function. Patients will understand the common causes for poor balance, possible solutions to these problems, and ways to modify their physical environment in order to minimize their fall risk. The purpose of this lesson is to teach patients about the importance of maintaining balance as they age and ways to minimize their risk of falling.  WORKSHOPS   Nutrition:  Fueling a Ship broker led group instruction and group discussion with PowerPoint presentation and patient guidebook. To enhance the learning environment the use of posters, models and videos may be added. Patients will review the foundational principles of the Pritikin Eating Plan and understand what constitutes a serving size in each of the food groups. Patients will also learn Pritikin-friendly foods that are better choices when away from home and review make-ahead meal and snack options. Calorie density will be reviewed and applied to three nutrition priorities: weight maintenance, weight loss, and weight gain. The purpose of this lesson is to reinforce (in a group setting) the key concepts around what patients are recommended to eat and how to apply these guidelines when away from home by planning and selecting Pritikin-friendly options. Patients will understand how calorie density may be adjusted for different weight management goals.  Mindful Eating  Clinical staff led group instruction and group discussion with PowerPoint presentation and patient guidebook. To enhance the learning environment the use of posters, models and videos may be added. Patients will briefly review the concepts of the Pritikin Eating Plan and the importance of low-calorie dense foods. The concept of mindful eating will be introduced as well as the  importance of paying attention to internal hunger signals. Triggers for non-hunger eating and techniques for dealing with triggers will be explored. The purpose of this lesson is to provide patients with the opportunity to review the basic principles of the Pritikin Eating Plan, discuss the value of eating mindfully and how to measure internal cues of hunger and fullness using the Hunger Scale. Patients will also discuss reasons for non-hunger eating and learn strategies to use for controlling emotional eating.  Targeting Your Nutrition Priorities Clinical staff led group instruction and group discussion with PowerPoint presentation and patient guidebook. To enhance the learning environment the use of posters, models and videos may be added. Patients will learn how to determine their genetic susceptibility to disease by reviewing their family history. Patients will gain insight into the importance of diet as part of an overall healthy lifestyle in mitigating the impact of genetics and other environmental insults. The purpose of this lesson is to provide patients with the opportunity to assess their personal nutrition priorities by looking at their family history, their own health history and current risk factors. Patients will also be able to discuss ways of prioritizing and modifying the Pritikin Eating Plan for their highest risk areas  Menu  Clinical staff led group instruction and group discussion with PowerPoint presentation and patient guidebook. To enhance the learning environment the use of posters, models and videos may be added. Using menus brought in from [employer], or printed from [employer], patients will apply the Pritikin dining out guidelines that were presented in the [employer] video. Patients will also be able to practice these guidelines in a variety of provided scenarios. The purpose of this lesson is to  provide patients with the opportunity to practice  hands-on learning of the [employer] guidelines with actual menus and practice scenarios.  Label Reading Clinical staff led group instruction and group discussion with PowerPoint presentation and patient guidebook. To enhance the learning environment the use of posters, models and videos may be added. Patients will review and discuss the Pritikin label reading guidelines presented in Pritikin's Label Reading Educational series video. Using fool labels brought in from local grocery stores and markets, patients will apply the label reading guidelines and determine if the packaged food meet the Pritikin guidelines. The purpose of this lesson is to provide patients with the opportunity to review, discuss, and practice hands-on learning of the Pritikin Label Reading guidelines with actual packaged food labels. Cooking School  Pritikin's [employer] are designed to teach patients ways to prepare quick, simple, and affordable recipes at home. The importance of nutrition's role in chronic disease risk reduction is reflected in its emphasis in the overall Pritikin program. By learning how to prepare essential core Pritikin Eating Plan recipes, patients will increase control over what they eat; be able to customize the flavor of foods without the use of added salt, sugar, or fat; and improve the quality of the food they consume. By learning a set of core recipes which are easily assembled, quickly prepared, and affordable, patients are more likely to prepare more healthy foods at home. These workshops focus on convenient breakfasts, simple entres, side dishes, and desserts which can be prepared with minimal effort and are consistent with nutrition recommendations for cardiovascular risk reduction. Cooking [employer] are taught by a Armed forces logistics/support/administrative officer (RD) who has been trained by the [employer]. The chef or RD has a clear understanding of the importance of minimizing -  if not completely eliminating - added fat, sugar, and sodium in recipes. Throughout the series of Cooking School Workshop sessions, patients will learn about healthy ingredients and efficient methods of cooking to build confidence in their capability to prepare    Cooking School weekly topics:  Adding Flavor- Sodium-Free  Fast and Healthy Breakfasts  Powerhouse Plant-Based Proteins  Satisfying Salads and Dressings  Simple Sides and Sauces  International Cuisine-Spotlight on the [employer] Zones  Delicious Desserts  Savory Soups  [employer] - Meals in a Astronomer Appetizers and Snacks  Comforting Weekend Breakfasts  One-Pot Wonders   Fast Evening Meals  Landscape architect Your Pritikin Plate  WORKSHOPS   Healthy Mindset (Psychosocial):  Focused Goals, Sustainable Changes Clinical staff led group instruction and group discussion with PowerPoint presentation and patient guidebook. To enhance the learning environment the use of posters, models and videos may be added. Patients will be able to apply effective goal setting strategies to establish at least one personal goal, and then take consistent, meaningful action toward that goal. They will learn to identify common barriers to achieving personal goals and develop strategies to overcome them. Patients will also gain an understanding of how our mind-set can impact our ability to achieve goals and the importance of cultivating a positive and growth-oriented mind-set. The purpose of this lesson is to provide patients with a deeper understanding of how to set and achieve personal goals, as well as the tools and strategies needed to overcome common obstacles which may arise along the way.  From Head to Heart: The Power of a Healthy Outlook  Clinical staff led group instruction and group discussion with PowerPoint presentation and patient guidebook.  To enhance the learning environment the use of posters, models and videos may be  added. Patients will be able to recognize and describe the impact of emotions and mood on physical health. They will discover the importance of self-care and explore self-care practices which may work for them. Patients will also learn how to utilize the 4 C's to cultivate a healthier outlook and better manage stress and challenges. The purpose of this lesson is to demonstrate to patients how a healthy outlook is an essential part of maintaining good health, especially as they continue their cardiac rehab journey.  Healthy Sleep for a Healthy Heart Clinical staff led group instruction and group discussion with PowerPoint presentation and patient guidebook. To enhance the learning environment the use of posters, models and videos may be added. At the conclusion of this workshop, patients will be able to demonstrate knowledge of the importance of sleep to overall health, well-being, and quality of life. They will understand the symptoms of, and treatments for, common sleep disorders. Patients will also be able to identify daytime and nighttime behaviors which impact sleep, and they will be able to apply these tools to help manage sleep-related challenges. The purpose of this lesson is to provide patients with a general overview of sleep and outline the importance of quality sleep. Patients will learn about a few of the most common sleep disorders. Patients will also be introduced to the concept of "sleep hygiene," and discover ways to self-manage certain sleeping problems through simple daily behavior changes. Finally, the workshop will motivate patients by clarifying the links between quality sleep and their goals of heart-healthy living.   Recognizing and Reducing Stress Clinical staff led group instruction and group discussion with PowerPoint presentation and patient guidebook. To enhance the learning environment the use of posters, models and videos may be added. At the conclusion of this workshop, patients  will be able to understand the types of stress reactions, differentiate between acute and chronic stress, and recognize the impact that chronic stress has on their health. They will also be able to apply different coping mechanisms, such as reframing negative self-talk. Patients will have the opportunity to practice a variety of stress management techniques, such as deep abdominal breathing, progressive muscle relaxation, and/or guided imagery.  The purpose of this lesson is to educate patients on the role of stress in their lives and to provide healthy techniques for coping with it.  Learning Barriers/Preferences:  Learning Barriers/Preferences - 07/25/23 1131       Learning Barriers/Preferences   Learning Barriers Sight   readers   Learning Preferences Audio;Computer/Internet;Group Instruction;Skilled Demonstration;Verbal Instruction;Video;Written Material;Pictoral;Individual Instruction             Education Topics:  Knowledge Questionnaire Score:  Knowledge Questionnaire Score - 07/25/23 1131       Knowledge Questionnaire Score   Pre Score 20/24             Core Components/Risk Factors/Patient Goals at Admission:  Personal Goals and Risk Factors at Admission - 07/25/23 1132       Core Components/Risk Factors/Patient Goals on Admission    Weight Management Yes;Weight Loss;Obesity    Intervention Weight Management: Develop a combined nutrition and exercise program designed to reach desired caloric intake, while maintaining appropriate intake of nutrient and fiber, sodium and fats, and appropriate energy expenditure required for the weight goal.;Weight Management: Provide education and appropriate resources to help participant work on and attain dietary goals.;Weight Management/Obesity: Establish reasonable short term and long term weight  goals.;Obesity: Provide education and appropriate resources to help participant work on and attain dietary goals.    Goal Weight: Long Term  185 lb (83.9 kg)   pt goal   Expected Outcomes Short Term: Continue to assess and modify interventions until short term weight is achieved;Long Term: Adherence to nutrition and physical activity/exercise program aimed toward attainment of established weight goal;Weight Loss: Understanding of general recommendations for a balanced deficit meal plan, which promotes 1-2 lb weight loss per week and includes a negative energy balance of [phone] kcal/d;Understanding recommendations for meals to include 15-35% energy as protein, 25-35% energy from fat, 35-60% energy from carbohydrates, less than 200mg  of dietary cholesterol, 20-35 gm of total fiber daily;Understanding of distribution of calorie intake throughout the day with the consumption of 4-5 meals/snacks    Hypertension Yes    Intervention Provide education on lifestyle modifcations including regular physical activity/exercise, weight management, moderate sodium restriction and increased consumption of fresh fruit, vegetables, and low fat dairy, alcohol moderation, and smoking cessation.;Monitor prescription use compliance.    Expected Outcomes Short Term: Continued assessment and intervention until BP is < 140/31mm HG in hypertensive participants. < 130/74mm HG in hypertensive participants with diabetes, heart failure or chronic kidney disease.;Long Term: Maintenance of blood pressure at goal levels.    Lipids Yes    Intervention Provide education and support for participant on nutrition & aerobic/resistive exercise along with prescribed medications to achieve LDL 70mg , HDL >40mg .    Expected Outcomes Short Term: Participant states understanding of desired cholesterol values and is compliant with medications prescribed. Participant is following exercise prescription and nutrition guidelines.;Long Term: Cholesterol controlled with medications as prescribed, with individualized exercise RX and with personalized nutrition plan. Value goals: LDL < 70mg , HDL >  40 mg.             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 Introduction to [facility] education/intensive cardiac rehab. Initial orientation packet reviewed with patient.                Comments: Participant attended orientation for the cardiac rehabilitation program on  07/25/2023  to perform initial intake and exercise walk test. Patient introduced to the [facility] education and orientation packet was reviewed. Completed 6-minute walk test, measurements, initial ITP, and exercise prescription. Vital signs stable. Telemetry-normal sinus rhythm, asymptomatic. Pt restarted metoprolol  on his own after being taken off of it by Dr. Lorie Rook. Discussed with RN Carlette, see note for details. Proceeded with after clearance, no s/sx and vital signs stable.   Service time was from 1032 to 1220.  Arlander Labrum, MS, ACSM-CEP 07/25/2023 1:33 PM

## 2023-07-25 NOTE — Telephone Encounter (Signed)
Please see previous telephone encounter and advise  

## 2023-07-25 NOTE — Progress Notes (Signed)
 Pt admitted that he restarted his metoprolol  on his own over the weekend because of elevated HR in the 90-100's with activity and elevated bp.  Pt presents today for Cardiac rehab orientation with HR in the 50's and bp 118/70.  Pt asymptomatic.  Pt did notify his cardiology provider of he readings and that he restarted the metoprolol . Reviewed notes and sent this message to Dr. Lorie Rook:  "he above pt is here this morning for Cardiac rehab orientation. As you are aware he restarted his Metoprolol  because of his belief his HR was too high 90's to low 100's with activity and his bp was too high.  4 mins AT Arun K Thukkani, MD that is fine  3 mins Presently his HR is in the 50's and his bp 118/70. He is asymptomatic. please advise what if anything to do  3 mins AT Arun K Thukkani, MD he can proceed"  Pt advised to check his message for further detail.  Talked with pt about recovery time and realistic expectations of his HR post bypass surgery and that time is essential for any major surgery recovery. Verbalized understanding. Lettie Ray, BSN Cardiac and Emergency planning/management officer

## 2023-07-25 NOTE — Telephone Encounter (Signed)
 Pt sent MyChart message, see below: Increased Ramipril  to 10 mg and stopped Metoprolol  Sat evening. BP 145/84 HR 94 Sun. morning. Concerned, I tool 75 mg Metoprolol  can HR decreased to 60's. Did not take Metoprolol  last night and BP this morning was 147/87, HR 95. I will take the Metoprolol  now. Please advise if I should continue the Metoprolol .    Patient identification verified by 2 forms.   Called and spoke to patient  Patient states:  -Pt states his resting heart rate is elevated 90s, its increased to 104 when walking to restroom (20 feet). -After taking Metoprolol  75 mg the heart rate decreased  -Heart rate was elevated this morning, current heart rate is: 89 (10-15 mins after taking medication).  -Pt has cardiac rehab at 10:30 this morning, he didn't want to have high heart rate so he took the Metoprolol  this morning.  -Pt states I notice it's high but it could just be a mental thing. -Pt was advised to call if his numbers were higher.   Patient denies:  -CP   Interventions/Plan: -Will get message to Dr. Lorie Rook for review.  -Pt would like a MyChart instead of a call back.   Reviewed ED warning signs/precautions  Patient agrees with plan, no questions at this time

## 2023-07-25 NOTE — Progress Notes (Signed)
 Cardiac Rehab Medication Review   Does the patient  feel that his/her medications are working for him/her? Yes    Has the patient been experiencing any side effects to the medications prescribed? No  Does the patient measure his/her own blood pressure or blood glucose at home?   Yes  Does the patient have any problems obtaining medications due to transportation or finances?   No  Understanding of regimen: excellent Understanding of indications: good Potential of compliance: excellent    Comments: Quamel understands his medications and regime well. He checks his BP daily. Herschel was recently taken off of his metoprolol  by Dr. Lorie Rook, he noticed his HR was elevated in the 90's and his BP was higher when he checked it. Orvin resumed his metoprolol , HR today on telemetry 54 with BP 118/58. Chin sent a message to Dr. Lorie Rook through [place] and is waiting to hear back about whether to continue metoprolol  or discontinue.    Arlander Labrum, MS, ACSM-CEP 07/25/2023 10:58 AM

## 2023-08-01 ENCOUNTER — Encounter (HOSPITAL_COMMUNITY)
Admission: RE | Admit: 2023-08-01 | Discharge: 2023-08-01 | Disposition: A | Discharge status: Home / Self Care | Source: Ambulatory Visit | Attending: Internal Medicine

## 2023-08-01 DIAGNOSIS — Z951 Presence of aortocoronary bypass graft: Secondary | ICD-10-CM | POA: Diagnosis not present

## 2023-08-01 NOTE — Progress Notes (Signed)
 Daily Session Note  Patient Details  Name: Tim Walters MRN: [id number] Date of Birth: [birth year] Referring Provider:   Flowsheet Row INTENSIVE CARDIAC REHAB ORIENT from 07/25/2023 in [facility]  Referring Provider Tim Backbone, MD    Encounter Date: 08/01/2023  Check In:  Session Check In - 08/01/23 1126       Check-In   Supervising physician immediately available to respond to emergencies [facility] MD immediately available    Physician(s) Tim Ar, NP    Location [facility]    Staff Present Tim Contes, MS, ACSM-CEP, CCRP, Exercise Physiologist;Tim Walters Tim Kays, MS, ACSM-CEP, Exercise Physiologist;Tim Walters BS, ACSM-CEP, Exercise Physiologist;Tim Alexia Angelucci, MS, Exercise Physiologist;Tim Whitaker, RN, BSN;Tim Walters BS, ACSM-CEP, Exercise Physiologist    Virtual Visit No    Medication changes reported     Yes    Comments med list updated. see note.    Fall or balance concerns reported    No    Tobacco Cessation No Change    Warm-up and Cool-down Performed as group-led instruction   CRP2 orientation   Resistance Training Performed No    VAD Patient? No    PAD/SET Patient? No      Pain Assessment   Currently in Pain? No/denies    Pain Score 0-No pain    Multiple Pain Sites No          Capillary Blood Glucose: No results found for this or any previous visit (from the past 24 hours).   Exercise Prescription Changes - 08/01/23 1026       Response to Exercise   Blood Pressure (Admit) 132/70    Blood Pressure (Exercise) 154/68    Blood Pressure (Exit) 122/80    Heart Rate (Admit) 93 bpm    Heart Rate (Exercise) 134 bpm    Heart Rate (Exit) 102 bpm    Rating of Perceived Exertion (Exercise) 10    Symptoms None    Comments Off to a good start with exercise.    Duration Continue with 30 min of aerobic exercise without signs/symptoms of physical distress.    Intensity THRR  unchanged      Progression   Progression Continue to progress workloads to maintain intensity without signs/symptoms of physical distress.    Average METs 3.3      Resistance Training   Training Prescription Yes    Weight 3 lbs    Reps 10-15    Time 5 Minutes      Interval Training   Interval Training No      Recumbant Elliptical   Level 1    RPM 65    Watts 96    Minutes 15    METs 3.8      Track   Laps 15    Minutes 15    METs 2.91          Social History   Tobacco Use  Smoking Status Former   Current packs/day: 0.00   Types: Cigarettes   Start date: 02/15/1962   Quit date: 02/15/1974   Years since quitting: 49.4  Smokeless Tobacco Never    Goals Met:  Exercise tolerated well No report of concerns or symptoms today Strength training completed today   Goals Unmet:  Not Applicable  Comments: Dom completed first exercise session on the equipment fairly well. Tolerated low intensity exercise but exceeded target heart rate range. Recent change to medication is being followed by his cardiologist. Will continue  to monitor.   Tim Walters is Medical Director for Cardiac Rehab at [facility].

## 2023-08-02 NOTE — Progress Notes (Addendum)
 I have reviewed and agree with the Rehab Treatment Plan as outlined above.  Cardiac Individual Treatment Plan  Patient Details  Name: Tim Walters MRN: [id number] Date of Birth: [birth year] Referring Provider:   Flowsheet Row INTENSIVE CARDIAC REHAB ORIENT from 07/25/2023 in [facility]  Referring Provider [name], MD    Initial Encounter Date:  Flowsheet Row INTENSIVE CARDIAC REHAB ORIENT from 07/25/2023 in [facility]  Date 07/25/23    Visit Diagnosis: S/P CABG x 4  Patient's Home Medications on Admission:  Current Outpatient Medications:    acetaminophen  (TYLENOL ) 325 MG tablet, Take 2 tablets (650 mg total) by mouth every 6 (six) hours as needed., Disp: , Rfl:    aspirin  EC 325 MG tablet, Take 1 tablet (325 mg total) by mouth daily. (Patient taking differently: Take 81 mg by mouth daily.), Disp: , Rfl:    atorvastatin  (LIPITOR ) 80 MG tablet, TAKE 1 TABLET BY MOUTH ONCE  DAILY, Disp: 90 tablet, Rfl: 3   ezetimibe  (ZETIA ) 10 MG tablet, TAKE 1 TABLET BY MOUTH DAILY, Disp: 90 tablet, Rfl: 3   omeprazole (PRILOSEC) 20 MG capsule, Take 20 mg by mouth daily., Disp: , Rfl:    ramipril  (ALTACE ) 10 MG capsule, Take 1 capsule (10 mg total) by mouth daily., Disp: 90 capsule, Rfl: 3   sertraline  (ZOLOFT ) 50 MG tablet, TAKE 1 TABLET BY MOUTH DAILY, Disp: 90 tablet, Rfl: 3 No current facility-administered medications for this encounter.  Facility-Administered Medications Ordered in Other Encounters:    regadenoson  (LEXISCAN ) injection SOLN 0.4 mg, 0.4 mg, Intravenous, Once, Valentina Gasman, Karon Packer., NP  Past Medical History: Past Medical History:  Diagnosis Date   Anginal pain (HCC)    Arthritis    lt ankle   Basal cell carcinoma    x2 (excised)   Breast lump    Per records from [facility]    Colon polyp    Coronary artery disease    a. BMS to RCA 2003 with  residual LAD/diag disease treated medically, normal EF.   COVID    Depression    Diverticulosis    Per records from [facility]    GERD (gastroesophageal reflux disease)    Hyperkalemia    a. K of 5.2 in 2017.   Hyperlipidemia    Hypertension    Idiopathic peripheral neuropathy    Per records from [facility]    Impaired fasting glucose    Per records from [facility]    Kidney stones    Monoclonal gammopathy of undetermined significance    Per records from [facility]    Myocardial infarction [facility]) 11/15/2001   Dr. Kay Parson [facility])   OSA (obstructive sleep apnea)    Per [facility] New Patient Packet   Peripheral neuropathy    Left Foot, Per [facility] New Patient Packet   Pre-diabetes    Sleep apnea    had test several yr ago-said he did not need a cpap-still snores   Torn rotator cuff    Per [facility] New Patient Packet   Venous insufficiency of left leg    Per [facility] New Patient Packet   Wears glasses     Tobacco Use: Social History   Tobacco Use  Smoking Status Former   Current packs/day: 0.00   Types: Cigarettes   Start date: 02/15/1962   Quit date: 02/15/1974   Years since quitting: 47.4  Smokeless Tobacco Never    Labs: Review Flowsheet  More data exists      Latest Ref Rng & Units 11/09/2022 04/20/2023 05/17/2023 06/07/2023 06/08/2023  Labs for ITP Cardiac and Pulmonary Rehab  Cholestrol <200 mg/dL - 191  478  - -  LDL (calc) mg/dL (calc) - 41  33  - -  HDL-C > OR = 40 mg/dL - 54  61  - -  Trlycerides <150 mg/dL - 41  49  - -  Hemoglobin A1c 4.8 - 5.6 % 6.3  - 6.5  6.0  -  PH, Arterial 7.35 - 7.45 - - - - 7.319  7.357  7.383  7.411  7.409  7.358  7.324   PCO2 arterial 32 - 48 mmHg - - - - 41.0  38.9  39.2  35.9  35.5  38.7  45.0   Bicarbonate 20.0 - 28.0 mmol/L - - - - 21.0  21.8  23.5  22.8  22.5  24.7  21.7  23.4   TCO2 22 - 32 mmol/L - - - - 22  23  25  24  24  24  25  26  23  25  25  26    Acid-base deficit 0.0 - 2.0 mmol/L - - - - 5.0  3.0  2.0   2.0  2.0  1.0  3.0  3.0   O2 Saturation % - - - - 94  96  93  99  100  86  100  100     Details       Multiple values from one day are sorted in reverse-chronological order         Capillary Blood Glucose: Lab Results  Component Value Date   GLUCAP 106 (H) 06/10/2023   GLUCAP 138 (H) 06/10/2023   GLUCAP 108 (H) 06/10/2023   GLUCAP 143 (H) 06/10/2023   GLUCAP 129 (H) 06/10/2023     Exercise Target Goals: Exercise Program Goal: Individual exercise prescription set using results from initial 6 min walk test and THRR while considering  patient's activity barriers and safety.   Exercise Prescription Goal: Initial exercise prescription builds to 30-45 minutes a day of aerobic activity, 2-3 days per week.  Home exercise guidelines will be given to patient during program as part of exercise prescription that the participant will acknowledge.  Activity Barriers & Risk Stratification:  Activity Barriers & Cardiac Risk Stratification - 07/25/23 1127       Activity Barriers & Cardiac Risk Stratification   Activity Barriers Other (comment);Arthritis;Joint Problems;Back Problems    Comments sternal precautions    Cardiac Risk Stratification High   <5 METs on         6 Minute Walk:  6 Minute Walk     Row Name 07/25/23 1324         6 Minute Walk   Phase Initial     Distance 1466 feet     Walk Time 6 minutes     # of Rest Breaks 0     MPH 2.78     METS 2.55     RPE 11     Perceived Dyspnea  0     VO2 Peak 8.93     Symptoms No     Resting HR 52 bpm     Resting BP 118/58     Resting Oxygen Saturation  96 %     Exercise Oxygen Saturation  during 6 min walk 96 %  Max Ex. HR 94 bpm     Max Ex. BP 146/66     2 Minute Post BP 122/70        Oxygen Initial Assessment:   Oxygen Re-Evaluation:   Oxygen Discharge (Final Oxygen Re-Evaluation):   Initial Exercise Prescription:  Initial Exercise Prescription - 07/25/23 1300       Date of Initial Exercise  RX and Referring Provider   Date 07/25/23    Referring Provider [name], MD    Expected Discharge Date 10/19/23      Recumbant Elliptical   Level 1    RPM 50    Watts 80    Minutes 15    METs 2.2      Track   Laps 16    Minutes 15    METs 2      Prescription Details   Frequency (times per week) 3    Duration Progress to 30 minutes of continuous aerobic without signs/symptoms of physical distress      Intensity   THRR 40-80% of Max Heartrate 56-113    Ratings of Perceived Exertion 11-13    Perceived Dyspnea 0-4      Progression   Progression Continue progressive overload as per policy without signs/symptoms or physical distress.      Resistance Training   Training Prescription Yes    Weight 3    Reps 10-15          Perform Capillary Blood Glucose checks as needed.  Exercise Prescription Changes:   Exercise Prescription Changes     Row Name 08/01/23 1026             Response to Exercise   Blood Pressure (Admit) 132/70       Blood Pressure (Exercise) 154/68       Blood Pressure (Exit) 122/80       Heart Rate (Admit) 93 bpm       Heart Rate (Exercise) 134 bpm       Heart Rate (Exit) 102 bpm       Rating of Perceived Exertion (Exercise) 10       Symptoms None       Comments Off to a good start with exercise.       Duration Continue with 30 min of aerobic exercise without signs/symptoms of physical distress.       Intensity THRR unchanged         Progression   Progression Continue to progress workloads to maintain intensity without signs/symptoms of physical distress.       Average METs 3.3         Resistance Training   Training Prescription Yes       Weight 3 lbs       Reps 10-15       Time 5 Minutes         Interval Training   Interval Training No         Recumbant Elliptical   Level 1       RPM 65       Watts 96       Minutes 15       METs 3.8         Track   Laps 15       Minutes 15       METs 2.91          Exercise  Comments:   Exercise Comments     Row Name 08/01/23 1135  Exercise Comments Tim Walters tolerated low intensity exercise fairly well. Rest breaks taken on the walking track due to elevated heart rate. Will continue to follow. Oriented him to the exercise equipment and stretching routine.          Exercise Goals and Review:   Exercise Goals     Row Name 07/25/23 1130             Exercise Goals   Increase Physical Activity Yes       Intervention Provide advice, education, support and counseling about physical activity/exercise needs.;Develop an individualized exercise prescription for aerobic and resistive training based on initial evaluation findings, risk stratification, comorbidities and participant's personal goals.       Expected Outcomes Short Term: Attend rehab on a regular basis to increase amount of physical activity.;Long Term: Exercising regularly at least 3-5 days a week.;Long Term: Add in home exercise to make exercise part of routine and to increase amount of physical activity.       Increase Strength and Stamina Yes       Intervention Provide advice, education, support and counseling about physical activity/exercise needs.;Develop an individualized exercise prescription for aerobic and resistive training based on initial evaluation findings, risk stratification, comorbidities and participant's personal goals.       Expected Outcomes Short Term: Increase workloads from initial exercise prescription for resistance, speed, and METs.;Short Term: Perform resistance training exercises routinely during rehab and add in resistance training at home;Long Term: Improve cardiorespiratory fitness, muscular endurance and strength as measured by increased METs and functional capacity ( )       Able to understand and use rate of perceived exertion (RPE) scale Yes       Intervention Provide education and explanation on how to use RPE scale       Expected Outcomes Short Term: Able to  use RPE daily in rehab to express subjective intensity level;Long Term:  Able to use RPE to guide intensity level when exercising independently       Knowledge and understanding of Target Heart Rate Range (THRR) Yes       Intervention Provide education and explanation of THRR including how the numbers were predicted and where they are located for reference       Expected Outcomes Short Term: Able to state/look up THRR;Short Term: Able to use daily as guideline for intensity in rehab;Long Term: Able to use THRR to govern intensity when exercising independently       Understanding of Exercise Prescription Yes       Intervention Provide education, explanation, and written materials on patient's individual exercise prescription       Expected Outcomes Short Term: Able to explain program exercise prescription;Long Term: Able to explain home exercise prescription to exercise independently          Exercise Goals Re-Evaluation :  Exercise Goals Re-Evaluation     Row Name 08/01/23 1135             Exercise Goal Re-Evaluation   Exercise Goals Review Increase Physical Activity;Increase Strength and Stamina;Able to understand and use rate of perceived exertion (RPE) scale       Comments Tim Walters was able to understand and use RPE scale appropriately.       Expected Outcomes Progress workloads as tolerated to help improve cardiorespiratory fitness.          Discharge Exercise Prescription (Final Exercise Prescription Changes):  Exercise Prescription Changes - 08/01/23 1026       Response to Exercise  Blood Pressure (Admit) 132/70    Blood Pressure (Exercise) 154/68    Blood Pressure (Exit) 122/80    Heart Rate (Admit) 93 bpm    Heart Rate (Exercise) 134 bpm    Heart Rate (Exit) 102 bpm    Rating of Perceived Exertion (Exercise) 10    Symptoms None    Comments Off to a good start with exercise.    Duration Continue with 30 min of aerobic exercise without signs/symptoms of physical distress.     Intensity THRR unchanged      Progression   Progression Continue to progress workloads to maintain intensity without signs/symptoms of physical distress.    Average METs 3.3      Resistance Training   Training Prescription Yes    Weight 3 lbs    Reps 10-15    Time 5 Minutes      Interval Training   Interval Training No      Recumbant Elliptical   Level 1    RPM 65    Watts 96    Minutes 15    METs 3.8      Track   Laps 15    Minutes 15    METs 2.91          Nutrition:  Target Goals: Understanding of nutrition guidelines, daily intake of sodium 1500mg , cholesterol 200mg , calories 30% from fat and 7% or less from saturated fats, daily to have 5 or more servings of fruits and vegetables.  Biometrics:  Pre Biometrics - 07/25/23 1124       Pre Biometrics   Waist Circumference 45 inches    Hip Circumference 41 inches    Waist to Hip Ratio 1.1 %    Triceps Skinfold 22 mm    % Body Fat 33.1 %    Grip Strength 28 kg    Flexibility --   not done- low back pain   Single Leg Stand 5.25 seconds           Nutrition Therapy Plan and Nutrition Goals:  Nutrition Therapy & Goals - 08/01/23 1126       Nutrition Therapy   Diet Heart Healthy Diet    Drug/Food Interactions Statins/Certain Fruits      Personal Nutrition Goals   Nutrition Goal Patient to identify strategies for reducing cardiovascular risk by attending the [employer]in education and nutrition series weekly.    Personal Goal #2 Patient to improve diet quality by using the plate method as a guide for meal planning to include lean protein/plant protein, fruits, vegetables, whole grains, nonfat dairy as part of a well-balanced diet.    Personal Goal #3 Patient to identify strategies for weight loss with goal of 0.5-2.0# per week.    Comments Patient has medical history of CABGx4, CAD, hyperlipidemia (statin,zetia ), aortic atherosclerosis, CKD2. LDL is at goal. A1c is in a prediabetic range. Patient is motivated  to lose weight; will continue to discuss strategies for weight loss including calorie density, the plate method as a guide for meal planning, label reading,etc. Patient will benefit from participation in intensive cardiac rehab for nutrition, exercise, and lifestyle modification      Intervention Plan   Intervention Nutrition handout(s) given to patient.;Prescribe, educate and counsel regarding individualized specific dietary modifications aiming towards targeted core components such as weight, hypertension, lipid management, diabetes, heart failure and other comorbidities.    Expected Outcomes Short Term Goal: Understand basic principles of dietary content, such as calories, fat, sodium, cholesterol and nutrients.;Long Term Goal:  Adherence to prescribed nutrition plan.          Nutrition Assessments:  Nutrition Assessments - 08/01/23 1131       Rate Your Plate Scores   Pre Score 50         MEDIFICTS Score Key: >=70 Need to make dietary changes  40-70 Heart Healthy Diet <= 40 Therapeutic Level Cholesterol Diet   Flowsheet Row INTENSIVE CARDIAC REHAB from 08/01/2023 in [facility]  Picture Your Plate Total Score on Admission 50   Picture Your Plate Scores: <86 Unhealthy dietary pattern with much room for improvement. 41-50 Dietary pattern unlikely to meet recommendations for good health and room for improvement. 51-60 More healthful dietary pattern, with some room for improvement.  >60 Healthy dietary pattern, although there may be some specific behaviors that could be improved.    Nutrition Goals Re-Evaluation:  Nutrition Goals Re-Evaluation     Row Name 08/01/23 1126             Goals   Current Weight 200 lb 6.4 oz (90.9 kg)       Comment LDL 33, HDL 61, A1c 6.0       Expected Outcome Patient has medical history of CABGx4, CAD, hyperlipidemia (statin,zetia ), aortic atherosclerosis, CKD2. LDL is at goal. A1c is  in a prediabetic range. Patient is motivated to lose weight; will continue to discuss strategies for weight loss including calorie density, the plate method as a guide for meal planning, label reading,etc. Patient will benefit from participation in intensive cardiac rehab for nutrition, exercise, and lifestyle modification          Nutrition Goals Re-Evaluation:  Nutrition Goals Re-Evaluation     Row Name 08/01/23 1126             Goals   Current Weight 200 lb 6.4 oz (90.9 kg)       Comment LDL 33, HDL 61, A1c 6.0       Expected Outcome Patient has medical history of CABGx4, CAD, hyperlipidemia (statin,zetia ), aortic atherosclerosis, CKD2. LDL is at goal. A1c is in a prediabetic range. Patient is motivated to lose weight; will continue to discuss strategies for weight loss including calorie density, the plate method as a guide for meal planning, label reading,etc. Patient will benefit from participation in intensive cardiac rehab for nutrition, exercise, and lifestyle modification          Nutrition Goals Discharge (Final Nutrition Goals Re-Evaluation):  Nutrition Goals Re-Evaluation - 08/01/23 1126       Goals   Current Weight 200 lb 6.4 oz (90.9 kg)    Comment LDL 33, HDL 61, A1c 6.0    Expected Outcome Patient has medical history of CABGx4, CAD, hyperlipidemia (statin,zetia ), aortic atherosclerosis, CKD2. LDL is at goal. A1c is in a prediabetic range. Patient is motivated to lose weight; will continue to discuss strategies for weight loss including calorie density, the plate method as a guide for meal planning, label reading,etc. Patient will benefit from participation in intensive cardiac rehab for nutrition, exercise, and lifestyle modification          Psychosocial: Target Goals: Acknowledge presence or absence of significant depression and/or stress, maximize coping skills, provide positive support system. Participant is able to verbalize types and ability to use techniques  and skills needed for reducing stress and depression.  Initial Review & Psychosocial Screening:  Initial Psych Review & Screening - 07/25/23 1130  Initial Review   Current issues with History of Depression      Family Dynamics   Good Support System? Yes   wife   Comments Duante has a history of depression but is in remission and feels Zoloft  is helping. No current concerns or need for additional resources.      Barriers   Psychosocial barriers to participate in program There are no identifiable barriers or psychosocial needs.      Screening Interventions   Interventions Encouraged to exercise;Provide feedback about the scores to participant    Expected Outcomes Long Term goal: The participant improves quality of Life and PHQ9 Scores as seen by post scores and/or verbalization of changes;Short Term goal: Identification and review with participant of any Quality of Life or Depression concerns found by scoring the questionnaire.          Quality of Life Scores:  Quality of Life - 07/25/23 1330       Quality of Life   Select Quality of Life      Quality of Life Scores   Health/Function Pre 25.33 %    Socioeconomic Pre 26.43 %    Psych/Spiritual Pre 24.67 %    Family Pre 21.6 %    GLOBAL Pre 24.88 %         Scores of 19 and below usually indicate a poorer quality of life in these areas.  A difference of  2-3 points is a clinically meaningful difference.  A difference of 2-3 points in the total score of the Quality of Life Index has been associated with significant improvement in overall quality of life, self-image, physical symptoms, and general health in studies assessing change in quality of life.  PHQ-9: Review Flowsheet  More data exists      07/25/2023 07/04/2023 07/01/2023 06/24/2023 06/21/2023  Depression screen PHQ 2/9  Decreased Interest 0 0 0 0 0  Down, Depressed, Hopeless 0 0 0 0 0  PHQ - 2 Score 0 0 0 0 0  Altered sleeping 0 0 - - -  Tired, decreased energy 1 2  - - -  Change in appetite 0 0 - - -  Feeling bad or failure about yourself  0 0 - - -  Trouble concentrating 0 0 - - -  Moving slowly or fidgety/restless 0 0 - - -  Suicidal thoughts 0 0 - - -  PHQ-9 Score 1 2 - - -  Difficult doing work/chores Somewhat difficult Not difficult at all - - -   Interpretation of Total Score  Total Score Depression Severity:  1-4 = Minimal depression, 5-9 = Mild depression, 10-14 = Moderate depression, 15-19 = Moderately severe depression, 20-27 = Severe depression   Psychosocial Evaluation and Intervention:   Psychosocial Re-Evaluation:  Psychosocial Re-Evaluation     Row Name 08/01/23 1817             Psychosocial Re-Evaluation   Current issues with History of Depression       Comments Tim Walters did not voice any increased concerns or stressors on his first day of exercise.       Expected Outcomes Tim Walters will have controlled or decreased depression uponcompletion of cardiac rehab.       Interventions Stress management education;Encouraged to attend Cardiac Rehabilitation for the exercise;Relaxation education       Continue Psychosocial Services  Follow up required by staff          Psychosocial Discharge (Final Psychosocial Re-Evaluation):  Psychosocial Re-Evaluation - 08/01/23 1817  Psychosocial Re-Evaluation   Current issues with History of Depression    Comments Tim Walters did not voice any increased concerns or stressors on his first day of exercise.    Expected Outcomes Tim Walters will have controlled or decreased depression uponcompletion of cardiac rehab.    Interventions Stress management education;Encouraged to attend Cardiac Rehabilitation for the exercise;Relaxation education    Continue Psychosocial Services  Follow up required by staff          Vocational Rehabilitation: Provide vocational rehab assistance to qualifying candidates.   Vocational Rehab Evaluation & Intervention:  Vocational Rehab - 07/25/23 1131       Initial  Vocational Rehab Evaluation & Intervention   Assessment shows need for Vocational Rehabilitation No   retired         Education: Education Goals: Education classes will be provided on a weekly basis, covering required topics. Participant will state understanding/return demonstration of topics presented.    Education     Row Name 08/01/23 1300     Education   Cardiac Education Topics Pritikin   Geographical information systems officer Psychosocial   Psychosocial Workshop Focused Goals, Sustainable Changes   Instruction Review Code 1- Verbalizes Understanding   Class Start Time 1155   Class Stop Time 1235   Class Time Calculation (min) 40 min      Core Videos: Exercise    Move It!  Clinical staff conducted group or individual video education with verbal and written material and guidebook.  Patient learns the recommended Pritikin exercise program. Exercise with the goal of living a long, healthy life. Some of the health benefits of exercise include controlled diabetes, healthier blood pressure levels, improved cholesterol levels, improved heart and lung capacity, improved sleep, and better body composition. Everyone should speak with their doctor before starting or changing an exercise routine.  Biomechanical Limitations Clinical staff conducted group or individual video education with verbal and written material and guidebook.  Patient learns how biomechanical limitations can impact exercise and how we can mitigate and possibly overcome limitations to have an impactful and balanced exercise routine.  Body Composition Clinical staff conducted group or individual video education with verbal and written material and guidebook.  Patient learns that body composition (ratio of muscle mass to fat mass) is a key component to assessing overall fitness, rather than body weight alone. Increased fat mass, especially visceral belly fat, can put us  at  increased risk for metabolic syndrome, type 2 diabetes, heart disease, and even death. It is recommended to combine diet and exercise (cardiovascular and resistance training) to improve your body composition. Seek guidance from your physician and exercise physiologist before implementing an exercise routine.  Exercise Action Plan Clinical staff conducted group or individual video education with verbal and written material and guidebook.  Patient learns the recommended strategies to achieve and enjoy long-term exercise adherence, including variety, self-motivation, self-efficacy, and positive decision making. Benefits of exercise include fitness, good health, weight management, more energy, better sleep, less stress, and overall well-being.  Medical   Heart Disease Risk Reduction Clinical staff conducted group or individual video education with verbal and written material and guidebook.  Patient learns our heart is our most vital organ as it circulates oxygen, nutrients, white blood cells, and hormones throughout the entire body, and carries waste away. Data supports a plant-based eating plan like the Pritikin Program for its effectiveness in slowing progression of and reversing heart disease. The video provides  a number of recommendations to address heart disease.   Metabolic Syndrome and Belly Fat  Clinical staff conducted group or individual video education with verbal and written material and guidebook.  Patient learns what metabolic syndrome is, how it leads to heart disease, and how one can reverse it and keep it from coming back. You have metabolic syndrome if you have 3 of the following 5 criteria: abdominal obesity, high blood pressure, high triglycerides, low HDL cholesterol, and high blood sugar.  Hypertension and Heart Disease Clinical staff conducted group or individual video education with verbal and written material and guidebook.  Patient learns that high blood pressure, or  hypertension, is very common in the [place] . Hypertension is largely due to excessive salt intake, but other important risk factors include being overweight, physical inactivity, drinking too much alcohol, smoking, and not eating enough potassium from fruits and vegetables. High blood pressure is a leading risk factor for heart attack, stroke, congestive heart failure, dementia, kidney failure, and premature death. Long-term effects of excessive salt intake include stiffening of the arteries and thickening of heart muscle and organ damage. Recommendations include ways to reduce hypertension and the risk of heart disease.  Diseases of Our Time - Focusing on Diabetes Clinical staff conducted group or individual video education with verbal and written material and guidebook.  Patient learns why the best way to stop diseases of our time is prevention, through food and other lifestyle changes. Medicine (such as prescription pills and surgeries) is often only a Band-Aid on the problem, not a long-term solution. Most common diseases of our time include obesity, type 2 diabetes, hypertension, heart disease, and cancer. The Pritikin Program is recommended and has been proven to help reduce, reverse, and/or prevent the damaging effects of metabolic syndrome.  Nutrition   Overview of the Pritikin Eating Plan  Clinical staff conducted group or individual video education with verbal and written material and guidebook.  Patient learns about the Pritikin Eating Plan for disease risk reduction. The Pritikin Eating Plan emphasizes a wide variety of unrefined, minimally-processed carbohydrates, like fruits, vegetables, whole grains, and legumes. Go, Caution, and Stop food choices are explained. Plant-based and lean animal proteins are emphasized. Rationale provided for low sodium intake for blood pressure control, low added sugars for blood sugar stabilization, and low added fats and oils for coronary artery disease  risk reduction and weight management.  Calorie Density  Clinical staff conducted group or individual video education with verbal and written material and guidebook.  Patient learns about calorie density and how it impacts the Pritikin Eating Plan. Knowing the characteristics of the food you choose will help you decide whether those foods will lead to weight gain or weight loss, and whether you want to consume more or less of them. Weight loss is usually a side effect of the Pritikin Eating Plan because of its focus on low calorie-dense foods.  Label Reading  Clinical staff conducted group or individual video education with verbal and written material and guidebook.  Patient learns about the Pritikin recommended label reading guidelines and corresponding recommendations regarding calorie density, added sugars, sodium content, and whole grains.  Dining Out - Part 1  Clinical staff conducted group or individual video education with verbal and written material and guidebook.  Patient learns that restaurant meals can be sabotaging because they can be so high in calories, fat, sodium, and/or sugar. Patient learns recommended strategies on how to positively address this and avoid unhealthy pitfalls.  Facts on Fats  Clinical staff conducted group or individual video education with verbal and written material and guidebook.  Patient learns that lifestyle modifications can be just as effective, if not more so, as many medications for lowering your risk of heart disease. A Pritikin lifestyle can help to reduce your risk of inflammation and atherosclerosis (cholesterol build-up, or plaque, in the artery walls). Lifestyle interventions such as dietary choices and physical activity address the cause of atherosclerosis. A review of the types of fats and their impact on blood cholesterol levels, along with dietary recommendations to reduce fat intake is also included.  Nutrition Action Plan  Clinical staff  conducted group or individual video education with verbal and written material and guidebook.  Patient learns how to incorporate Pritikin recommendations into their lifestyle. Recommendations include planning and keeping personal health goals in mind as an important part of their success.  Healthy Mind-Set    Healthy Minds, Bodies, Hearts  Clinical staff conducted group or individual video education with verbal and written material and guidebook.  Patient learns how to identify when they are stressed. Video will discuss the impact of that stress, as well as the many benefits of stress management. Patient will also be introduced to stress management techniques. The way we think, act, and feel has an impact on our hearts.  How Our Thoughts Can Heal Our Hearts  Clinical staff conducted group or individual video education with verbal and written material and guidebook.  Patient learns that negative thoughts can cause depression and anxiety. This can result in negative lifestyle behavior and serious health problems. Cognitive behavioral therapy is an effective method to help control our thoughts in order to change and improve our emotional outlook.  Additional Videos:  Exercise    Improving Performance  Clinical staff conducted group or individual video education with verbal and written material and guidebook.  Patient learns to use a non-linear approach by alternating intensity levels and lengths of time spent exercising to help burn more calories and lose more body fat. Cardiovascular exercise helps improve heart health, metabolism, hormonal balance, blood sugar control, and recovery from fatigue. Resistance training improves strength, endurance, balance, coordination, reaction time, metabolism, and muscle mass. Flexibility exercise improves circulation, posture, and balance. Seek guidance from your physician and exercise physiologist before implementing an exercise routine and learn your capabilities  and proper form for all exercise.  Introduction to Yoga  Clinical staff conducted group or individual video education with verbal and written material and guidebook.  Patient learns about yoga, a discipline of the coming together of mind, breath, and body. The benefits of yoga include improved flexibility, improved range of motion, better posture and core strength, increased lung function, weight loss, and positive self-image. Yoga's heart health benefits include lowered blood pressure, healthier heart rate, decreased cholesterol and triglyceride levels, improved immune function, and reduced stress. Seek guidance from your physician and exercise physiologist before implementing an exercise routine and learn your capabilities and proper form for all exercise.  Medical   Aging: Enhancing Your Quality of Life  Clinical staff conducted group or individual video education with verbal and written material and guidebook.  Patient learns key strategies and recommendations to stay in good physical health and enhance quality of life, such as prevention strategies, having an advocate, securing a Health Care Proxy and Power of Attorney, and keeping a list of medications and system for tracking them. It also discusses how to avoid risk for bone loss.  Biology of Weight Control  Clinical staff conducted  group or individual video education with verbal and written material and guidebook.  Patient learns that weight gain occurs because we consume more calories than we burn (eating more, moving less). Even if your body weight is normal, you may have higher ratios of fat compared to muscle mass. Too much body fat puts you at increased risk for cardiovascular disease, heart attack, stroke, type 2 diabetes, and obesity-related cancers. In addition to exercise, following the Pritikin Eating Plan can help reduce your risk.  Decoding Lab Results  Clinical staff conducted group or individual video education with verbal and  written material and guidebook.  Patient learns that lab test reflects one measurement whose values change over time and are influenced by many factors, including medication, stress, sleep, exercise, food, hydration, pre-existing medical conditions, and more. It is recommended to use the knowledge from this video to become more involved with your lab results and evaluate your numbers to speak with your doctor.   Diseases of Our Time - Overview  Clinical staff conducted group or individual video education with verbal and written material and guidebook.  Patient learns that according to the CDC, 50% to 70% of chronic diseases (such as obesity, type 2 diabetes, elevated lipids, hypertension, and heart disease) are avoidable through lifestyle improvements including healthier food choices, listening to satiety cues, and increased physical activity.  Sleep Disorders Clinical staff conducted group or individual video education with verbal and written material and guidebook.  Patient learns how good quality and duration of sleep are important to overall health and well-being. Patient also learns about sleep disorders and how they impact health along with recommendations to address them, including discussing with a physician.  Nutrition  Dining Out - Part 2 Clinical staff conducted group or individual video education with verbal and written material and guidebook.  Patient learns how to plan ahead and communicate in order to maximize their dining experience in a healthy and nutritious manner. Included are recommended food choices based on the type of restaurant the patient is visiting.   Fueling a Banker conducted group or individual video education with verbal and written material and guidebook.  There is a strong connection between our food choices and our health. Diseases like obesity and type 2 diabetes are very prevalent and are in large-part due to lifestyle choices. The  Pritikin Eating Plan provides plenty of food and hunger-curbing satisfaction. It is easy to follow, affordable, and helps reduce health risks.  Menu Workshop  Clinical staff conducted group or individual video education with verbal and written material and guidebook.  Patient learns that restaurant meals can sabotage health goals because they are often packed with calories, fat, sodium, and sugar. Recommendations include strategies to plan ahead and to communicate with the manager, chef, or server to help order a healthier meal.  Planning Your Eating Strategy  Clinical staff conducted group or individual video education with verbal and written material and guidebook.  Patient learns about the Pritikin Eating Plan and its benefit of reducing the risk of disease. The Pritikin Eating Plan does not focus on calories. Instead, it emphasizes high-quality, nutrient-rich foods. By knowing the characteristics of the foods, we choose, we can determine their calorie density and make informed decisions.  Targeting Your Nutrition Priorities  Clinical staff conducted group or individual video education with verbal and written material and guidebook.  Patient learns that lifestyle habits have a tremendous impact on disease risk and progression. This video provides eating and physical activity  recommendations based on your personal health goals, such as reducing LDL cholesterol, losing weight, preventing or controlling type 2 diabetes, and reducing high blood pressure.  Vitamins and Minerals  Clinical staff conducted group or individual video education with verbal and written material and guidebook.  Patient learns different ways to obtain key vitamins and minerals, including through a recommended healthy diet. It is important to discuss all supplements you take with your doctor.   Healthy Mind-Set    Smoking Cessation  Clinical staff conducted group or individual video education with verbal and written  material and guidebook.  Patient learns that cigarette smoking and tobacco addiction pose a serious health risk which affects millions of people. Stopping smoking will significantly reduce the risk of heart disease, lung disease, and many forms of cancer. Recommended strategies for quitting are covered, including working with your doctor to develop a successful plan.  Culinary   Becoming a Set designer conducted group or individual video education with verbal and written material and guidebook.  Patient learns that cooking at home can be healthy, cost-effective, quick, and puts them in control. Keys to cooking healthy recipes will include looking at your recipe, assessing your equipment needs, planning ahead, making it simple, choosing cost-effective seasonal ingredients, and limiting the use of added fats, salts, and sugars.  Cooking - Breakfast and Snacks  Clinical staff conducted group or individual video education with verbal and written material and guidebook.  Patient learns how important breakfast is to satiety and nutrition through the entire day. Recommendations include key foods to eat during breakfast to help stabilize blood sugar levels and to prevent overeating at meals later in the day. Planning ahead is also a key component.  Cooking - Educational psychologist conducted group or individual video education with verbal and written material and guidebook.  Patient learns eating strategies to improve overall health, including an approach to cook more at home. Recommendations include thinking of animal protein as a side on your plate rather than center stage and focusing instead on lower calorie dense options like vegetables, fruits, whole grains, and plant-based proteins, such as beans. Making sauces in large quantities to freeze for later and leaving the skin on your vegetables are also recommended to maximize your experience.  Cooking - Healthy Salads and  Dressing Clinical staff conducted group or individual video education with verbal and written material and guidebook.  Patient learns that vegetables, fruits, whole grains, and legumes are the foundations of the Pritikin Eating Plan. Recommendations include how to incorporate each of these in flavorful and healthy salads, and how to create homemade salad dressings. Proper handling of ingredients is also covered. Cooking - Soups and [employer] - Soups and Desserts Clinical staff conducted group or individual video education with verbal and written material and guidebook.  Patient learns that Pritikin soups and desserts make for easy, nutritious, and delicious snacks and meal components that are low in sodium, fat, sugar, and calorie density, while high in vitamins, minerals, and filling fiber. Recommendations include simple and healthy ideas for soups and desserts.   Overview     The Pritikin Solution Program Overview Clinical staff conducted group or individual video education with verbal and written material and guidebook.  Patient learns that the results of the Pritikin Program have been documented in more than 100 articles published in peer-reviewed journals, and the benefits include reducing risk factors for (and, in some cases, even reversing) high cholesterol, high blood pressure,  type 2 diabetes, obesity, and more! An overview of the three key pillars of the Pritikin Program will be covered: eating well, doing regular exercise, and having a healthy mind-set.  WORKSHOPS  Exercise: Exercise Basics: Building Your Action Plan Clinical staff led group instruction and group discussion with PowerPoint presentation and patient guidebook. To enhance the learning environment the use of posters, models and videos may be added. At the conclusion of this workshop, patients will comprehend the difference between physical activity and exercise, as well as the benefits of incorporating both, into  their routine. Patients will understand the FITT (Frequency, Intensity, Time, and Type) principle and how to use it to build an exercise action plan. In addition, safety concerns and other considerations for exercise and cardiac rehab will be addressed by the presenter. The purpose of this lesson is to promote a comprehensive and effective weekly exercise routine in order to improve patients' overall level of fitness.   Managing Heart Disease: Your Path to a Healthier Heart Clinical staff led group instruction and group discussion with PowerPoint presentation and patient guidebook. To enhance the learning environment the use of posters, models and videos may be added.At the conclusion of this workshop, patients will understand the anatomy and physiology of the heart. Additionally, they will understand how Pritikin's three pillars impact the risk factors, the progression, and the management of heart disease.  The purpose of this lesson is to provide a high-level overview of the heart, heart disease, and how the Pritikin lifestyle positively impacts risk factors.  Exercise Biomechanics Clinical staff led group instruction and group discussion with PowerPoint presentation and patient guidebook. To enhance the learning environment the use of posters, models and videos may be added. Patients will learn how the structural parts of their bodies function and how these functions impact their daily activities, movement, and exercise. Patients will learn how to promote a neutral spine, learn how to manage pain, and identify ways to improve their physical movement in order to promote healthy living. The purpose of this lesson is to expose patients to common physical limitations that impact physical activity. Participants will learn practical ways to adapt and manage aches and pains, and to minimize their effect on regular exercise. Patients will learn how to maintain good posture while sitting, walking, and  lifting.  Balance Training and Fall Prevention  Clinical staff led group instruction and group discussion with PowerPoint presentation and patient guidebook. To enhance the learning environment the use of posters, models and videos may be added. At the conclusion of this workshop, patients will understand the importance of their sensorimotor skills (vision, proprioception, and the vestibular system) in maintaining their ability to balance as they age. Patients will apply a variety of balancing exercises that are appropriate for their current level of function. Patients will understand the common causes for poor balance, possible solutions to these problems, and ways to modify their physical environment in order to minimize their fall risk. The purpose of this lesson is to teach patients about the importance of maintaining balance as they age and ways to minimize their risk of falling.  WORKSHOPS   Nutrition:  Fueling a Ship broker led group instruction and group discussion with PowerPoint presentation and patient guidebook. To enhance the learning environment the use of posters, models and videos may be added. Patients will review the foundational principles of the Pritikin Eating Plan and understand what constitutes a serving size in each of the food groups. Patients will also learn Pritikin-friendly  foods that are better choices when away from home and review make-ahead meal and snack options. Calorie density will be reviewed and applied to three nutrition priorities: weight maintenance, weight loss, and weight gain. The purpose of this lesson is to reinforce (in a group setting) the key concepts around what patients are recommended to eat and how to apply these guidelines when away from home by planning and selecting Pritikin-friendly options. Patients will understand how calorie density may be adjusted for different weight management goals.  Mindful Eating  Clinical staff led  group instruction and group discussion with PowerPoint presentation and patient guidebook. To enhance the learning environment the use of posters, models and videos may be added. Patients will briefly review the concepts of the Pritikin Eating Plan and the importance of low-calorie dense foods. The concept of mindful eating will be introduced as well as the importance of paying attention to internal hunger signals. Triggers for non-hunger eating and techniques for dealing with triggers will be explored. The purpose of this lesson is to provide patients with the opportunity to review the basic principles of the Pritikin Eating Plan, discuss the value of eating mindfully and how to measure internal cues of hunger and fullness using the Hunger Scale. Patients will also discuss reasons for non-hunger eating and learn strategies to use for controlling emotional eating.  Targeting Your Nutrition Priorities Clinical staff led group instruction and group discussion with PowerPoint presentation and patient guidebook. To enhance the learning environment the use of posters, models and videos may be added. Patients will learn how to determine their genetic susceptibility to disease by reviewing their family history. Patients will gain insight into the importance of diet as part of an overall healthy lifestyle in mitigating the impact of genetics and other environmental insults. The purpose of this lesson is to provide patients with the opportunity to assess their personal nutrition priorities by looking at their family history, their own health history and current risk factors. Patients will also be able to discuss ways of prioritizing and modifying the Pritikin Eating Plan for their highest risk areas  Menu  Clinical staff led group instruction and group discussion with PowerPoint presentation and patient guidebook. To enhance the learning environment the use of posters, models and videos may be added. Using menus  brought in from [employer], or printed from [employer], patients will apply the Pritikin dining out guidelines that were presented in the [employer] video. Patients will also be able to practice these guidelines in a variety of provided scenarios. The purpose of this lesson is to provide patients with the opportunity to practice hands-on learning of the Pritikin Dining Out guidelines with actual menus and practice scenarios.  Label Reading Clinical staff led group instruction and group discussion with PowerPoint presentation and patient guidebook. To enhance the learning environment the use of posters, models and videos may be added. Patients will review and discuss the Pritikin label reading guidelines presented in Pritikin's Label Reading Educational series video. Using fool labels brought in from local grocery stores and markets, patients will apply the label reading guidelines and determine if the packaged food meet the Pritikin guidelines. The purpose of this lesson is to provide patients with the opportunity to review, discuss, and practice hands-on learning of the Pritikin Label Reading guidelines with actual packaged food labels. Cooking School  Pritikin's [employer] are designed to teach patients ways to prepare quick, simple, and affordable recipes at home. The importance of nutrition's role  in chronic disease risk reduction is reflected in its emphasis in the overall Pritikin program. By learning how to prepare essential core Pritikin Eating Plan recipes, patients will increase control over what they eat; be able to customize the flavor of foods without the use of added salt, sugar, or fat; and improve the quality of the food they consume. By learning a set of core recipes which are easily assembled, quickly prepared, and affordable, patients are more likely to prepare more healthy foods at home. These workshops focus on convenient breakfasts, simple  entres, side dishes, and desserts which can be prepared with minimal effort and are consistent with nutrition recommendations for cardiovascular risk reduction. Cooking [employer] are taught by a Armed forces logistics/support/administrative officer (RD) who has been trained by the [employer]. The chef or RD has a clear understanding of the importance of minimizing - if not completely eliminating - added fat, sugar, and sodium in recipes. Throughout the series of Cooking School Workshop sessions, patients will learn about healthy ingredients and efficient methods of cooking to build confidence in their capability to prepare    Cooking School weekly topics:  Adding Flavor- Sodium-Free  Fast and Healthy Breakfasts  Powerhouse Plant-Based Proteins  Satisfying Salads and Dressings  Simple Sides and Sauces  International Cuisine-Spotlight on the [employer] Zones  Delicious Desserts  Savory Soups  [employer] - Meals in a Astronomer Appetizers and Snacks  Comforting Weekend Breakfasts  One-Pot Wonders   Fast Evening Meals  Landscape architect Your Pritikin Plate  WORKSHOPS   Healthy Mindset (Psychosocial):  Focused Goals, Sustainable Changes Clinical staff led group instruction and group discussion with PowerPoint presentation and patient guidebook. To enhance the learning environment the use of posters, models and videos may be added. Patients will be able to apply effective goal setting strategies to establish at least one personal goal, and then take consistent, meaningful action toward that goal. They will learn to identify common barriers to achieving personal goals and develop strategies to overcome them. Patients will also gain an understanding of how our mind-set can impact our ability to achieve goals and the importance of cultivating a positive and growth-oriented mind-set. The purpose of this lesson is to provide patients with a deeper understanding of how to set and  achieve personal goals, as well as the tools and strategies needed to overcome common obstacles which may arise along the way.  From Head to Heart: The Power of a Healthy Outlook  Clinical staff led group instruction and group discussion with PowerPoint presentation and patient guidebook. To enhance the learning environment the use of posters, models and videos may be added. Patients will be able to recognize and describe the impact of emotions and mood on physical health. They will discover the importance of self-care and explore self-care practices which may work for them. Patients will also learn how to utilize the 4 C's to cultivate a healthier outlook and better manage stress and challenges. The purpose of this lesson is to demonstrate to patients how a healthy outlook is an essential part of maintaining good health, especially as they continue their cardiac rehab journey.  Healthy Sleep for a Healthy Heart Clinical staff led group instruction and group discussion with PowerPoint presentation and patient guidebook. To enhance the learning environment the use of posters, models and videos may be added. At the conclusion of this workshop, patients will be able to demonstrate knowledge of the importance of sleep to overall health,  well-being, and quality of life. They will understand the symptoms of, and treatments for, common sleep disorders. Patients will also be able to identify daytime and nighttime behaviors which impact sleep, and they will be able to apply these tools to help manage sleep-related challenges. The purpose of this lesson is to provide patients with a general overview of sleep and outline the importance of quality sleep. Patients will learn about a few of the most common sleep disorders. Patients will also be introduced to the concept of "sleep hygiene," and discover ways to self-manage certain sleeping problems through simple daily behavior changes. Finally, the workshop will motivate  patients by clarifying the links between quality sleep and their goals of heart-healthy living.   Recognizing and Reducing Stress Clinical staff led group instruction and group discussion with PowerPoint presentation and patient guidebook. To enhance the learning environment the use of posters, models and videos may be added. At the conclusion of this workshop, patients will be able to understand the types of stress reactions, differentiate between acute and chronic stress, and recognize the impact that chronic stress has on their health. They will also be able to apply different coping mechanisms, such as reframing negative self-talk. Patients will have the opportunity to practice a variety of stress management techniques, such as deep abdominal breathing, progressive muscle relaxation, and/or guided imagery.  The purpose of this lesson is to educate patients on the role of stress in their lives and to provide healthy techniques for coping with it.  Learning Barriers/Preferences:  Learning Barriers/Preferences - 07/25/23 1131       Learning Barriers/Preferences   Learning Barriers Sight   readers   Learning Preferences Audio;Computer/Internet;Group Instruction;Skilled Demonstration;Verbal Instruction;Video;Written Material;Pictoral;Individual Instruction          Education Topics:  Knowledge Questionnaire Score:  Knowledge Questionnaire Score - 07/25/23 1131       Knowledge Questionnaire Score   Pre Score 20/24          Core Components/Risk Factors/Patient Goals at Admission:  Personal Goals and Risk Factors at Admission - 07/25/23 1132       Core Components/Risk Factors/Patient Goals on Admission    Weight Management Yes;Weight Loss;Obesity    Intervention Weight Management: Develop a combined nutrition and exercise program designed to reach desired caloric intake, while maintaining appropriate intake of nutrient and fiber, sodium and fats, and appropriate energy expenditure  required for the weight goal.;Weight Management: Provide education and appropriate resources to help participant work on and attain dietary goals.;Weight Management/Obesity: Establish reasonable short term and long term weight goals.;Obesity: Provide education and appropriate resources to help participant work on and attain dietary goals.    Goal Weight: Long Term 185 lb (83.9 kg)   pt goal   Expected Outcomes Short Term: Continue to assess and modify interventions until short term weight is achieved;Long Term: Adherence to nutrition and physical activity/exercise program aimed toward attainment of established weight goal;Weight Loss: Understanding of general recommendations for a balanced deficit meal plan, which promotes 1-2 lb weight loss per week and includes a negative energy balance of [phone] kcal/d;Understanding recommendations for meals to include 15-35% energy as protein, 25-35% energy from fat, 35-60% energy from carbohydrates, less than 200mg  of dietary cholesterol, 20-35 gm of total fiber daily;Understanding of distribution of calorie intake throughout the day with the consumption of 4-5 meals/snacks    Hypertension Yes    Intervention Provide education on lifestyle modifcations including regular physical activity/exercise, weight management, moderate sodium restriction and increased consumption of fresh  fruit, vegetables, and low fat dairy, alcohol moderation, and smoking cessation.;Monitor prescription use compliance.    Expected Outcomes Short Term: Continued assessment and intervention until BP is < 140/46mm HG in hypertensive participants. < 130/46mm HG in hypertensive participants with diabetes, heart failure or chronic kidney disease.;Long Term: Maintenance of blood pressure at goal levels.    Lipids Yes    Intervention Provide education and support for participant on nutrition & aerobic/resistive exercise along with prescribed medications to achieve LDL 70mg , HDL >40mg .    Expected  Outcomes Short Term: Participant states understanding of desired cholesterol values and is compliant with medications prescribed. Participant is following exercise prescription and nutrition guidelines.;Long Term: Cholesterol controlled with medications as prescribed, with individualized exercise RX and with personalized nutrition plan. Value goals: LDL < 70mg , HDL > 40 mg.          Core Components/Risk Factors/Patient Goals Review:   Goals and Risk Factor Review     Row Name 08/01/23 1819             Core Components/Risk Factors/Patient Goals Review   Personal Goals Review Weight Management/Obesity;Hypertension;Lipids       Review Tim Walters started cardiac rehab on 08/01/23. Arno did well with exercise. Vital signs were stable. Tim Walters did exceed his target heart rate as Dr Lorie Rook discontinued his betablocker at his last office visit.       Expected Outcomes Tim Walters will continue to participate in cardiac rehab for exercise, nutrition and lifestyle modificaitons          Core Components/Risk Factors/Patient Goals at Discharge (Final Review):   Goals and Risk Factor Review - 08/01/23 1819       Core Components/Risk Factors/Patient Goals Review   Personal Goals Review Weight Management/Obesity;Hypertension;Lipids    Review Tim Walters started cardiac rehab on 08/01/23. Tim Walters did well with exercise. Vital signs were stable. Tim Walters did exceed his target heart rate as Dr Lorie Rook discontinued his betablocker at his last office visit.    Expected Outcomes Tim Walters will continue to participate in cardiac rehab for exercise, nutrition and lifestyle modificaitons          ITP Comments:  ITP Comments     Row Name 07/25/23 1125 08/01/23 1135         ITP Comments [name] medical director. Introduction to pritikin education/intensive cardiac rehab. Initial orientation packet reviewed with patient. 30-day ITP reviewe. Abelino tolerated low intensity exercise fairly well but exceeded target heart rate  range. Will continue to monitor.         Comments: See ITP Comments

## 2023-08-03 ENCOUNTER — Encounter (HOSPITAL_COMMUNITY)
Admission: RE | Admit: 2023-08-03 | Discharge: 2023-08-03 | Disposition: A | Discharge status: Home / Self Care | Source: Ambulatory Visit | Attending: Internal Medicine | Admitting: Internal Medicine

## 2023-08-03 DIAGNOSIS — I10 Essential (primary) hypertension: Secondary | ICD-10-CM | POA: Diagnosis not present

## 2023-08-03 DIAGNOSIS — Z951 Presence of aortocoronary bypass graft: Secondary | ICD-10-CM

## 2023-08-03 DIAGNOSIS — N182 Chronic kidney disease, stage 2 (mild): Secondary | ICD-10-CM | POA: Diagnosis not present

## 2023-08-03 LAB — BASIC METABOLIC PANEL WITH GFR
BUN/Creatinine Ratio: 18 (ref 10–24)
BUN: 17 mg/dL (ref 8–27)
CO2: 20 mmol/L (ref 20–29)
Calcium: 9 mg/dL (ref 8.6–10.2)
Chloride: 104 mmol/L (ref 96–106)
Creatinine, Ser: 0.95 mg/dL (ref 0.76–1.27)
Glucose: 104 mg/dL — ABNORMAL HIGH (ref 70–99)
Potassium: 4.4 mmol/L (ref 3.5–5.2)
Sodium: 139 mmol/L (ref 134–144)
eGFR: 81 mL/min/{1.73_m2} (ref >59)

## 2023-08-04 ENCOUNTER — Ambulatory Visit: Payer: Self-pay | Admitting: Internal Medicine

## 2023-08-05 ENCOUNTER — Encounter (HOSPITAL_COMMUNITY)
Admission: RE | Admit: 2023-08-05 | Discharge: 2023-08-05 | Disposition: A | Discharge status: Home / Self Care | Source: Ambulatory Visit | Attending: Internal Medicine | Admitting: Internal Medicine

## 2023-08-05 DIAGNOSIS — Z951 Presence of aortocoronary bypass graft: Secondary | ICD-10-CM | POA: Diagnosis not present

## 2023-08-08 ENCOUNTER — Encounter (HOSPITAL_COMMUNITY)
Admission: RE | Admit: 2023-08-08 | Discharge: 2023-08-08 | Disposition: A | Discharge status: Home / Self Care | Source: Ambulatory Visit | Attending: Internal Medicine | Admitting: Internal Medicine

## 2023-08-08 DIAGNOSIS — Z951 Presence of aortocoronary bypass graft: Secondary | ICD-10-CM | POA: Diagnosis not present

## 2023-08-10 ENCOUNTER — Encounter (HOSPITAL_COMMUNITY)
Admission: RE | Admit: 2023-08-10 | Discharge: 2023-08-10 | Disposition: A | Discharge status: Home / Self Care | Source: Ambulatory Visit | Attending: Internal Medicine

## 2023-08-10 DIAGNOSIS — Z951 Presence of aortocoronary bypass graft: Secondary | ICD-10-CM

## 2023-08-12 ENCOUNTER — Encounter (HOSPITAL_COMMUNITY)
Admission: RE | Admit: 2023-08-12 | Discharge: 2023-08-12 | Disposition: A | Discharge status: Home / Self Care | Source: Ambulatory Visit | Attending: Internal Medicine | Admitting: Internal Medicine

## 2023-08-12 ENCOUNTER — Telehealth: Payer: Self-pay | Admitting: Internal Medicine

## 2023-08-12 DIAGNOSIS — Z951 Presence of aortocoronary bypass graft: Secondary | ICD-10-CM | POA: Diagnosis not present

## 2023-08-12 NOTE — Progress Notes (Signed)
 Resting blood pressures noted in the 140's. Exertional BP's noted in the 160's. Patient has noticed that his blood pressure and heart rate have increased since his metoprolol  has been discontinued. Medications reviewed. Taking as prescribed. Will notify Dr Parry office. Patient is doing well with exercise at cardiac rehab.Hadassah Elpidio Quan RN BSN

## 2023-08-12 NOTE — Telephone Encounter (Signed)
 Caller Merwyn) returned RN's call.

## 2023-08-12 NOTE — Telephone Encounter (Signed)
 Left message for Hadassah at Cardiac Rehab to call back.

## 2023-08-12 NOTE — Telephone Encounter (Signed)
 Left voicemail to return call to office

## 2023-08-12 NOTE — Telephone Encounter (Signed)
 Caller Merwyn) wants a call back to discuss patient's BP readings and Metoprolol  medication.

## 2023-08-12 NOTE — Telephone Encounter (Signed)
 Tim Walters with Cardiac Rehab called back. She stated patient's BP's have been really high today, and she was concerned. Last office visit patient's metoprolol  was discontinue and rampril was increased to 10 mg. Since Dr. Wendel is out of the office today and next two weeks, I will contact DOD for advisement.

## 2023-08-15 ENCOUNTER — Encounter (HOSPITAL_COMMUNITY)
Admission: RE | Admit: 2023-08-15 | Discharge: 2023-08-15 | Disposition: A | Discharge status: Home / Self Care | Source: Ambulatory Visit | Attending: Internal Medicine

## 2023-08-15 ENCOUNTER — Telehealth (HOSPITAL_COMMUNITY): Payer: Self-pay | Admitting: *Deleted

## 2023-08-15 DIAGNOSIS — Z951 Presence of aortocoronary bypass graft: Secondary | ICD-10-CM

## 2023-08-15 NOTE — Telephone Encounter (Signed)
 Friday's DOD, Dr. Cindie, advised patient to follow-up with his PCP.

## 2023-08-15 NOTE — Telephone Encounter (Signed)
-----   Message from Arun K Thukkani sent at 08/15/2023  8:29 AM EDT ----- Regarding: RE: Increase execise target heart rate range That should be fine ----- Message ----- From: Valere Arnoldo HERO Sent: 08/15/2023   7:07 AM EDT To: Arun K Thukkani, MD Subject: Increase execise target heart rate range       Greetings Dr. Wendel,  Javione Gunawan has been in rehab approximately 2 weeks and is doing well.  Blood pressure is within normal limits, but exercise heart rates are beginning to exceed Target Heart Rate Range(THRR) of 56-113 bpm (40%-80% of age predicted max HR).  If no GXT is planned for the near future and MD agrees, request to increase THR to 40% -90% of age predicted max HR 56-127 bpm.   Thank you,  Arnoldo HERO Valere, MS, ACSM CEP

## 2023-08-15 NOTE — Progress Notes (Signed)
 Discussed Kerrigan's blood pressures with Holley Martinis RN triage nurse about Solly's elevated BP's on Friday. I talked with Deward and he said that he thinks he dropped his ramipril  in the sink before coming to exercise. Pam discussed Yeray's blood pressures on Friday with the Dr of the day. The DOD said to have Nhan's PCP Harlene An NP review the patient's blood pressure. Blood pressures from today are as follows. BP  126/62 heart rate 83. with exercise now 170/78 hr 113 on the octane. Repeat blood pressure 160/62 on the walking track. Exit blood pressure 112/60. No complaints upon exit from cardiac rehab. Will continue to monitor the patient throughout  the program.Romir Klimowicz Elpidio Quan RN BSN

## 2023-08-15 NOTE — Progress Notes (Signed)
 Reviewed home exercise guidelines with Tim Walters including endpoints, temperature precautions, target heart rate and rate of perceived exertion. He is currently swimming breast stroke 10-15 laps in about 40-45 minutes 4 days/week as his mode of home exercise. He warms up walking/ slow legs kicks the first 5 minutes and rests after 5 laps. Received clearance from Dr. Wendel to increase Tim Walters's exercise target heart rate range to 56-127. He has a smart watch to monitor his pulse. Tim Walters voices understanding of instructions given.  Arnoldo CHRISTELLA Gal, MS, ACSM CEP

## 2023-08-17 ENCOUNTER — Encounter (HOSPITAL_COMMUNITY)
Admission: RE | Admit: 2023-08-17 | Discharge: 2023-08-17 | Disposition: A | Discharge status: Home / Self Care | Source: Ambulatory Visit | Attending: Internal Medicine | Admitting: Internal Medicine

## 2023-08-17 DIAGNOSIS — Z48812 Encounter for surgical aftercare following surgery on the circulatory system: Secondary | ICD-10-CM | POA: Diagnosis not present

## 2023-08-17 DIAGNOSIS — Z951 Presence of aortocoronary bypass graft: Secondary | ICD-10-CM | POA: Insufficient documentation

## 2023-08-22 ENCOUNTER — Encounter (HOSPITAL_COMMUNITY)
Admission: RE | Admit: 2023-08-22 | Discharge: 2023-08-22 | Disposition: A | Discharge status: Home / Self Care | Source: Ambulatory Visit | Attending: Internal Medicine | Admitting: Internal Medicine

## 2023-08-22 DIAGNOSIS — Z48812 Encounter for surgical aftercare following surgery on the circulatory system: Secondary | ICD-10-CM | POA: Diagnosis not present

## 2023-08-22 DIAGNOSIS — Z951 Presence of aortocoronary bypass graft: Secondary | ICD-10-CM

## 2023-08-24 ENCOUNTER — Encounter (HOSPITAL_COMMUNITY)
Admission: RE | Admit: 2023-08-24 | Discharge: 2023-08-24 | Disposition: A | Discharge status: Home / Self Care | Source: Ambulatory Visit | Attending: Internal Medicine | Admitting: Internal Medicine

## 2023-08-24 DIAGNOSIS — Z951 Presence of aortocoronary bypass graft: Secondary | ICD-10-CM

## 2023-08-24 DIAGNOSIS — Z48812 Encounter for surgical aftercare following surgery on the circulatory system: Secondary | ICD-10-CM | POA: Diagnosis not present

## 2023-08-26 ENCOUNTER — Encounter (HOSPITAL_COMMUNITY)
Admission: RE | Admit: 2023-08-26 | Discharge: 2023-08-26 | Disposition: A | Discharge status: Home / Self Care | Source: Ambulatory Visit | Attending: Internal Medicine

## 2023-08-26 DIAGNOSIS — Z48812 Encounter for surgical aftercare following surgery on the circulatory system: Secondary | ICD-10-CM | POA: Diagnosis not present

## 2023-08-26 DIAGNOSIS — Z951 Presence of aortocoronary bypass graft: Secondary | ICD-10-CM | POA: Diagnosis not present

## 2023-08-29 ENCOUNTER — Encounter: Payer: Self-pay | Admitting: Internal Medicine

## 2023-08-29 ENCOUNTER — Encounter (HOSPITAL_COMMUNITY)
Admission: RE | Admit: 2023-08-29 | Discharge: 2023-08-29 | Disposition: A | Discharge status: Home / Self Care | Source: Ambulatory Visit | Attending: Internal Medicine | Admitting: Internal Medicine

## 2023-08-29 DIAGNOSIS — Z951 Presence of aortocoronary bypass graft: Secondary | ICD-10-CM | POA: Diagnosis not present

## 2023-08-29 DIAGNOSIS — Z48812 Encounter for surgical aftercare following surgery on the circulatory system: Secondary | ICD-10-CM | POA: Diagnosis not present

## 2023-08-29 DIAGNOSIS — R000 Tachycardia, unspecified: Secondary | ICD-10-CM

## 2023-08-29 NOTE — Progress Notes (Signed)
 Cardiac Individual Treatment Plan  Patient Details  Name: Tim Walters MRN: [id number] Date of Birth: 10-26-1945 Referring Provider:   Flowsheet Row INTENSIVE CARDIAC REHAB ORIENT from 07/25/2023 in [facility]  Referring Provider Lurena Red, MD    Initial Encounter Date:  Flowsheet Row INTENSIVE CARDIAC REHAB ORIENT from 07/25/2023 in [facility] Heart, Vascular, [facility] Lung Health  Date 07/25/23    Visit Diagnosis: S/P CABG x 4  Patient's Home Medications on Admission:  Current Outpatient Medications:    acetaminophen  (TYLENOL ) 325 MG tablet, Take 2 tablets (650 mg total) by mouth every 6 (six) hours as needed., Disp: , Rfl:    aspirin  EC 325 MG tablet, Take 1 tablet (325 mg total) by mouth daily. (Patient taking differently: Take 81 mg by mouth daily.), Disp: , Rfl:    atorvastatin  (LIPITOR ) 80 MG tablet, TAKE 1 TABLET BY MOUTH ONCE  DAILY, Disp: 90 tablet, Rfl: 3   ezetimibe  (ZETIA ) 10 MG tablet, TAKE 1 TABLET BY MOUTH DAILY, Disp: 90 tablet, Rfl: 3   omeprazole (PRILOSEC) 20 MG capsule, Take 20 mg by mouth daily., Disp: , Rfl:    ramipril  (ALTACE ) 10 MG capsule, Take 1 capsule (10 mg total) by mouth daily., Disp: 90 capsule, Rfl: 3   sertraline  (ZOLOFT ) 50 MG tablet, TAKE 1 TABLET BY MOUTH DAILY, Disp: 90 tablet, Rfl: 3 No current facility-administered medications for this encounter.  Facility-Administered Medications Ordered in Other Encounters:    regadenoson  (LEXISCAN ) injection SOLN 0.4 mg, 0.4 mg, Intravenous, Once, Wyn, Jackee VEAR Raddle., NP  Past Medical History: Past Medical History:  Diagnosis Date   Anginal pain (HCC)    Arthritis    lt ankle   Basal cell carcinoma    x2 (excised)   Breast lump    Per records from [facility]    Colon polyp    Coronary artery disease    a. BMS to RCA 2003 with residual LAD/diag disease treated medically, normal EF.   COVID    Depression     Diverticulosis    Per records from [facility]    GERD (gastroesophageal reflux disease)    Hyperkalemia    a. K of 5.2 in 2017.   Hyperlipidemia    Hypertension    Idiopathic peripheral neuropathy    Per records from [facility]    Impaired fasting glucose    Per records from [facility]    Kidney stones    Monoclonal gammopathy of undetermined significance    Per records from [facility]    Myocardial infarction [facility]) 11/15/2001   Dr. Victory Sharps [facility])   OSA (obstructive sleep apnea)    Per [facility] New Patient Packet   Peripheral neuropathy    Left Foot, Per [facility] New Patient Packet   Pre-diabetes    Sleep apnea    had test several yr ago-said he did not need a cpap-still snores   Torn rotator cuff    Per [facility] New Patient Packet   Venous insufficiency of left leg    Per [facility] New Patient Packet   Wears glasses     Tobacco Use: Social History   Tobacco Use  Smoking Status Former   Current packs/day: 0.00   Types: Cigarettes   Start date: 02/15/1962   Quit date: 02/15/1974   Years since quitting: 49.5  Smokeless Tobacco Never    Labs: Review Flowsheet  More data exists  Latest Ref Rng & Units 11/09/2022 04/20/2023 05/17/2023 06/07/2023 06/08/2023  Labs for ITP Cardiac and Pulmonary Rehab  Cholestrol <200 mg/dL - 893  891  - -  LDL (calc) mg/dL (calc) - 41  33  - -  HDL-C > OR = 40 mg/dL - 54  61  - -  Trlycerides <150 mg/dL - 41  49  - -  Hemoglobin A1c 4.8 - 5.6 % 6.3  - 6.5  6.0  -  PH, Arterial 7.35 - 7.45 - - - - 7.319  7.357  7.383  7.411  7.409  7.358  7.324   PCO2 arterial 32 - 48 mmHg - - - - 41.0  38.9  39.2  35.9  35.5  38.7  45.0   Bicarbonate 20.0 - 28.0 mmol/L - - - - 21.0  21.8  23.5  22.8  22.5  24.7  21.7  23.4   TCO2 22 - 32 mmol/L - - - - 22  23  25  24  24  24  25  26  23  25  25  26    Acid-base deficit 0.0 - 2.0 mmol/L - - - - 5.0  3.0  2.0  2.0  2.0  1.0  3.0  3.0   O2 Saturation % - - - - 94  96  93  99  100  86   100  100     Details       Multiple values from one day are sorted in reverse-chronological order         Capillary Blood Glucose: Lab Results  Component Value Date   GLUCAP 106 (H) 06/10/2023   GLUCAP 138 (H) 06/10/2023   GLUCAP 108 (H) 06/10/2023   GLUCAP 143 (H) 06/10/2023   GLUCAP 129 (H) 06/10/2023     Exercise Target Goals: Exercise Program Goal: Individual exercise prescription set using results from initial 6 min walk test and THRR while considering  patient's activity barriers and safety.   Exercise Prescription Goal: Initial exercise prescription builds to 30-45 minutes a day of aerobic activity, 2-3 days per week.  Home exercise guidelines will be given to patient during program as part of exercise prescription that the participant will acknowledge.  Activity Barriers & Risk Stratification:  Activity Barriers & Cardiac Risk Stratification - 07/25/23 1127       Activity Barriers & Cardiac Risk Stratification   Activity Barriers Other (comment);Arthritis;Joint Problems;Back Problems    Comments sternal precautions    Cardiac Risk Stratification High   <5 METs on         6 Minute Walk:  6 Minute Walk     Row Name 07/25/23 1324         6 Minute Walk   Phase Initial     Distance 1466 feet     Walk Time 6 minutes     # of Rest Breaks 0     MPH 2.78     METS 2.55     RPE 11     Perceived Dyspnea  0     VO2 Peak 8.93     Symptoms No     Resting HR 52 bpm     Resting BP 118/58     Resting Oxygen Saturation  96 %     Exercise Oxygen Saturation  during 6 min walk 96 %     Max Ex. HR 94 bpm     Max Ex. BP 146/66     2  Minute Post BP 122/70        Oxygen Initial Assessment:   Oxygen Re-Evaluation:   Oxygen Discharge (Final Oxygen Re-Evaluation):   Initial Exercise Prescription:  Initial Exercise Prescription - 07/25/23 1300       Date of Initial Exercise RX and Referring Provider   Date 07/25/23    Referring Provider Lurena Red, MD    Expected Discharge Date 10/19/23      Recumbant Elliptical   Level 1    RPM 50    Watts 80    Minutes 15    METs 2.2      Track   Laps 16    Minutes 15    METs 2      Prescription Details   Frequency (times per week) 3    Duration Progress to 30 minutes of continuous aerobic without signs/symptoms of physical distress      Intensity   THRR 40-80% of Max Heartrate 56-113    Ratings of Perceived Exertion 11-13    Perceived Dyspnea 0-4      Progression   Progression Continue progressive overload as per policy without signs/symptoms or physical distress.      Resistance Training   Training Prescription Yes    Weight 3    Reps 10-15          Perform Capillary Blood Glucose checks as needed.  Exercise Prescription Changes:   Exercise Prescription Changes     Row Name 08/01/23 1026 08/15/23 1033 08/29/23 1034         Response to Exercise   Blood Pressure (Admit) 132/70 124/66 132/70     Blood Pressure (Exercise) 154/68 170/78 --     Blood Pressure (Exit) 122/80 112/60 110/60     Heart Rate (Admit) 93 bpm 83 bpm 90 bpm     Heart Rate (Exercise) 134 bpm 133 bpm 142 bpm     Heart Rate (Exit) 102 bpm 91 bpm 99 bpm     Rating of Perceived Exertion (Exercise) 10 12 12      Symptoms None None None     Comments Off to a good start with exercise. Reviewed exercise prescription, goals, and THRR. Increased workload on recumbent elliptical today. --     Duration Continue with 30 min of aerobic exercise without signs/symptoms of physical distress. Continue with 30 min of aerobic exercise without signs/symptoms of physical distress. Continue with 30 min of aerobic exercise without signs/symptoms of physical distress.     Intensity THRR unchanged THRR unchanged THRR unchanged       Progression   Progression Continue to progress workloads to maintain intensity without signs/symptoms of physical distress. Continue to progress workloads to maintain intensity without  signs/symptoms of physical distress. Continue to progress workloads to maintain intensity without signs/symptoms of physical distress.     Average METs 3.3 3.9 4.1       Resistance Training   Training Prescription Yes Yes Yes     Weight 3 lbs 3 lbs 5 lbs     Reps 10-15 10-15 10-15     Time 5 Minutes 5 Minutes 5 Minutes       Interval Training   Interval Training No No No       Recumbant Elliptical   Level 1 3 4      RPM 65 72 70     Watts 96 113 110     Minutes 15 15 15      METs 3.8 4.5 4.7  Track   Laps 15 18 19      Minutes 15 15 15      METs 2.91 3.3 3.43       Home Exercise Plan   Plans to continue exercise at -- Home (comment)  Swimming at pool at son's residence. Home (comment)  Swimming at pool at son's residence.     Frequency -- Add 4 additional days to program exercise sessions. Add 4 additional days to program exercise sessions.     Initial Home Exercises Provided -- 08/15/23 08/15/23        Exercise Comments:   Exercise Comments     Row Name 08/01/23 1135 08/15/23 1050 08/22/23 1115       Exercise Comments Leeland tolerated low intensity exercise fairly well. Rest breaks taken on the walking track due to elevated heart rate. Will continue to follow. Oriented him to the exercise equipment and stretching routine. Reviewed home exercise guidelines, goals, and target heart rate range increase with Deward. Reviewed METs with Deward. Increased hand weights today from 3 to 5 lbs.        Exercise Goals and Review:   Exercise Goals     Row Name 07/25/23 1130             Exercise Goals   Increase Physical Activity Yes       Intervention Provide advice, education, support and counseling about physical activity/exercise needs.;Develop an individualized exercise prescription for aerobic and resistive training based on initial evaluation findings, risk stratification, comorbidities and participant's personal goals.       Expected Outcomes Short Term: Attend rehab on a  regular basis to increase amount of physical activity.;Long Term: Exercising regularly at least 3-5 days a week.;Long Term: Add in home exercise to make exercise part of routine and to increase amount of physical activity.       Increase Strength and Stamina Yes       Intervention Provide advice, education, support and counseling about physical activity/exercise needs.;Develop an individualized exercise prescription for aerobic and resistive training based on initial evaluation findings, risk stratification, comorbidities and participant's personal goals.       Expected Outcomes Short Term: Increase workloads from initial exercise prescription for resistance, speed, and METs.;Short Term: Perform resistance training exercises routinely during rehab and add in resistance training at home;Long Term: Improve cardiorespiratory fitness, muscular endurance and strength as measured by increased METs and functional capacity ( )       Able to understand and use rate of perceived exertion (RPE) scale Yes       Intervention Provide education and explanation on how to use RPE scale       Expected Outcomes Short Term: Able to use RPE daily in rehab to express subjective intensity level;Long Term:  Able to use RPE to guide intensity level when exercising independently       Knowledge and understanding of Target Heart Rate Range (THRR) Yes       Intervention Provide education and explanation of THRR including how the numbers were predicted and where they are located for reference       Expected Outcomes Short Term: Able to state/look up THRR;Short Term: Able to use daily as guideline for intensity in rehab;Long Term: Able to use THRR to govern intensity when exercising independently       Understanding of Exercise Prescription Yes       Intervention Provide education, explanation, and written materials on patient's individual exercise prescription       Expected  Outcomes Short Term: Able to explain program exercise  prescription;Long Term: Able to explain home exercise prescription to exercise independently          Exercise Goals Re-Evaluation :  Exercise Goals Re-Evaluation     Row Name 08/01/23 1135 08/15/23 1050           Exercise Goal Re-Evaluation   Exercise Goals Review Increase Physical Activity;Increase Strength and Stamina;Able to understand and use rate of perceived exertion (RPE) scale Increase Physical Activity;Increase Strength and Stamina;Able to understand and use rate of perceived exertion (RPE) scale;Knowledge and understanding of Target Heart Rate Range (THRR);Able to check pulse independently;Understanding of Exercise Prescription      Comments Michoel was able to understand and use RPE scale appropriately. Received clearance from Dr. Wendel to increase THRR to 40-90% age predicted max, 56-127 bpm. Reviewed exercise prescription and THRR increase with Deward. He has a smart watch he can use to monitor his pulse. He is swimming 40-45 minutes 4 days/week. He has a smart watch to monitor his pulse. [facility] and [facility] once the Fall comes and the pool is closed.      Expected Outcomes Progress workloads as tolerated to help improve cardiorespiratory fitness. Axl will continue daily exercise routine to help achieve personal health and fitness goals.         Discharge Exercise Prescription (Final Exercise Prescription Changes):  Exercise Prescription Changes - 08/29/23 1034       Response to Exercise   Blood Pressure (Admit) 132/70    Blood Pressure (Exit) 110/60    Heart Rate (Admit) 90 bpm    Heart Rate (Exercise) 142 bpm    Heart Rate (Exit) 99 bpm    Rating of Perceived Exertion (Exercise) 12    Symptoms None    Duration Continue with 30 min of aerobic exercise without signs/symptoms of physical distress.    Intensity THRR unchanged      Progression   Progression Continue to progress workloads to maintain intensity without signs/symptoms of physical  distress.    Average METs 4.1      Resistance Training   Training Prescription Yes    Weight 5 lbs    Reps 10-15    Time 5 Minutes      Interval Training   Interval Training No      Recumbant Elliptical   Level 4    RPM 70    Watts 110    Minutes 15    METs 4.7      Track   Laps 19    Minutes 15    METs 3.43      Home Exercise Plan   Plans to continue exercise at Home (comment)   Swimming at pool at son's residence.   Frequency Add 4 additional days to program exercise sessions.    Initial Home Exercises Provided 08/15/23          Nutrition:  Target Goals: Understanding of nutrition guidelines, daily intake of sodium 1500mg , cholesterol 200mg , calories 30% from fat and 7% or less from saturated fats, daily to have 5 or more servings of fruits and vegetables.  Biometrics:  Pre Biometrics - 07/25/23 1124       Pre Biometrics   Waist Circumference 45 inches    Hip Circumference 41 inches    Waist to Hip Ratio 1.1 %    Triceps Skinfold 22 mm    % Body Fat 33.1 %    Grip  Strength 28 kg    Flexibility --   not done- low back pain   Single Leg Stand 5.25 seconds           Nutrition Therapy Plan and Nutrition Goals:  Nutrition Therapy & Goals - 08/29/23 1155       Nutrition Therapy   Diet Heart Healthy Diet    Drug/Food Interactions Statins/Certain Fruits      Personal Nutrition Goals   Nutrition Goal Patient to identify strategies for reducing cardiovascular risk by attending the [facility] education and nutrition series weekly.   goal in progress.   Personal Goal #2 Patient to improve diet quality by using the plate method as a guide for meal planning to include lean protein/plant protein, fruits, vegetables, whole grains, nonfat dairy as part of a well-balanced diet.   goal in progress.   Personal Goal #3 Patient to identify strategies for weight loss with goal of 0.5-2.0# per week.   goal in progress.   Comments Goals in progress. Patient has medical  history of CABGx4, CAD, hyperlipidemia (statin,zetia ), aortic atherosclerosis, CKD2. He continues to attend the [facility] education and nutrition series regularly.  LDL is at goal. A1c is in a prediabetic range. Patient is motivated to lose weight; will continue to discuss strategies for weight loss including calorie density, the plate method as a guide for meal planning, label reading,etc. He is down 1.3# since starting with our program. Patient will benefit from participation in intensive cardiac rehab for nutrition, exercise, and lifestyle modification      Intervention Plan   Intervention Nutrition handout(s) given to patient.;Prescribe, educate and counsel regarding individualized specific dietary modifications aiming towards targeted core components such as weight, hypertension, lipid management, diabetes, heart failure and other comorbidities.    Expected Outcomes Short Term Goal: Understand basic principles of dietary content, such as calories, fat, sodium, cholesterol and nutrients.;Long Term Goal: Adherence to prescribed nutrition plan.          Nutrition Assessments:  Nutrition Assessments - 08/01/23 1131       Rate Your Plate Scores   Pre Score 50         MEDIFICTS Score Key: >=70 Need to make dietary changes  40-70 Heart Healthy Diet <= 40 Therapeutic Level Cholesterol Diet   Flowsheet Row INTENSIVE CARDIAC REHAB from 08/01/2023 in [facility] Heart, Vascular, & Lung Health  Picture Your Plate Total Score on Admission 50   Picture Your Plate Scores: <59 Unhealthy dietary pattern with much room for improvement. 41-50 Dietary pattern unlikely to meet recommendations for good health and room for improvement. 51-60 More healthful dietary pattern, with some room for improvement.  >60 Healthy dietary pattern, although there may be some specific behaviors that could be improved.    Nutrition Goals Re-Evaluation:  Nutrition Goals Re-Evaluation      Row Name 08/01/23 1126 08/29/23 1155           Goals   Current Weight 200 lb 6.4 oz (90.9 kg) 199 lb 1.2 oz (90.3 kg)      Comment LDL 33, HDL 61, A1c 6.0 LDL 33, HDL 61, A1c 6.0      Expected Outcome Patient has medical history of CABGx4, CAD, hyperlipidemia (statin,zetia ), aortic atherosclerosis, CKD2. LDL is at goal. A1c is in a prediabetic range. Patient is motivated to lose weight; will continue to discuss strategies for weight loss including calorie density, the plate method as a guide for meal planning, label reading,etc. Patient will benefit  from participation in intensive cardiac rehab for nutrition, exercise, and lifestyle modification Goals in progress. Patient has medical history of CABGx4, CAD, hyperlipidemia (statin,zetia ), aortic atherosclerosis, CKD2. He continues to attend the [facility] education and nutrition series regularly. LDL is at goal. A1c is in a prediabetic range. Patient is motivated to lose weight; will continue to discuss strategies for weight loss including calorie density, the plate method as a guide for meal planning, label reading,etc. He is down 1.3# since starting with our program. Patient will benefit from participation in intensive cardiac rehab for nutrition, exercise, and lifestyle modification         Nutrition Goals Re-Evaluation:  Nutrition Goals Re-Evaluation     Row Name 08/01/23 1126 08/29/23 1155           Goals   Current Weight 200 lb 6.4 oz (90.9 kg) 199 lb 1.2 oz (90.3 kg)      Comment LDL 33, HDL 61, A1c 6.0 LDL 33, HDL 61, A1c 6.0      Expected Outcome Patient has medical history of CABGx4, CAD, hyperlipidemia (statin,zetia ), aortic atherosclerosis, CKD2. LDL is at goal. A1c is in a prediabetic range. Patient is motivated to lose weight; will continue to discuss strategies for weight loss including calorie density, the plate method as a guide for meal planning, label reading,etc. Patient will benefit from participation in intensive cardiac  rehab for nutrition, exercise, and lifestyle modification Goals in progress. Patient has medical history of CABGx4, CAD, hyperlipidemia (statin,zetia ), aortic atherosclerosis, CKD2. He continues to attend the [facility] education and nutrition series regularly. LDL is at goal. A1c is in a prediabetic range. Patient is motivated to lose weight; will continue to discuss strategies for weight loss including calorie density, the plate method as a guide for meal planning, label reading,etc. He is down 1.3# since starting with our program. Patient will benefit from participation in intensive cardiac rehab for nutrition, exercise, and lifestyle modification         Nutrition Goals Discharge (Final Nutrition Goals Re-Evaluation):  Nutrition Goals Re-Evaluation - 08/29/23 1155       Goals   Current Weight 199 lb 1.2 oz (90.3 kg)    Comment LDL 33, HDL 61, A1c 6.0    Expected Outcome Goals in progress. Patient has medical history of CABGx4, CAD, hyperlipidemia (statin,zetia ), aortic atherosclerosis, CKD2. He continues to attend the [facility] education and nutrition series regularly. LDL is at goal. A1c is in a prediabetic range. Patient is motivated to lose weight; will continue to discuss strategies for weight loss including calorie density, the plate method as a guide for meal planning, label reading,etc. He is down 1.3# since starting with our program. Patient will benefit from participation in intensive cardiac rehab for nutrition, exercise, and lifestyle modification          Psychosocial: Target Goals: Acknowledge presence or absence of significant depression and/or stress, maximize coping skills, provide positive support system. Participant is able to verbalize types and ability to use techniques and skills needed for reducing stress and depression.  Initial Review & Psychosocial Screening:  Initial Psych Review & Screening - 07/25/23 1130       Initial Review   Current issues with History of  Depression      Family Dynamics   Good Support System? Yes   wife   Comments Dmani has a history of depression but is in remission and feels Zoloft  is helping. No current concerns or need for additional resources.      Barriers  Psychosocial barriers to participate in program There are no identifiable barriers or psychosocial needs.      Screening Interventions   Interventions Encouraged to exercise;Provide feedback about the scores to participant    Expected Outcomes Long Term goal: The participant improves quality of Life and PHQ9 Scores as seen by post scores and/or verbalization of changes;Short Term goal: Identification and review with participant of any Quality of Life or Depression concerns found by scoring the questionnaire.          Quality of Life Scores:  Quality of Life - 07/25/23 1330       Quality of Life   Select Quality of Life      Quality of Life Scores   Health/Function Pre 25.33 %    Socioeconomic Pre 26.43 %    Psych/Spiritual Pre 24.67 %    Family Pre 21.6 %    GLOBAL Pre 24.88 %         Scores of 19 and below usually indicate a poorer quality of life in these areas.  A difference of  2-3 points is a clinically meaningful difference.  A difference of 2-3 points in the total score of the Quality of Life Index has been associated with significant improvement in overall quality of life, self-image, physical symptoms, and general health in studies assessing change in quality of life.  PHQ-9: Review Flowsheet  More data exists      07/25/2023 07/04/2023 07/01/2023 06/24/2023 06/21/2023  Depression screen PHQ 2/9  Decreased Interest 0 0 0 0 0  Down, Depressed, Hopeless 0 0 0 0 0  PHQ - 2 Score 0 0 0 0 0  Altered sleeping 0 0 - - -  Tired, decreased energy 1 2 - - -  Change in appetite 0 0 - - -  Feeling bad or failure about yourself  0 0 - - -  Trouble concentrating 0 0 - - -  Moving slowly or fidgety/restless 0 0 - - -  Suicidal thoughts 0 0 - - -  PHQ-9  Score 1 2 - - -  Difficult doing work/chores Somewhat difficult Not difficult at all - - -   Interpretation of Total Score  Total Score Depression Severity:  1-4 = Minimal depression, 5-9 = Mild depression, 10-14 = Moderate depression, 15-19 = Moderately severe depression, 20-27 = Severe depression   Psychosocial Evaluation and Intervention:   Psychosocial Re-Evaluation:  Psychosocial Re-Evaluation     Row Name 08/01/23 1817 08/23/23 1643           Psychosocial Re-Evaluation   Current issues with History of Depression History of Depression      Comments Kile did not voice any increased concerns or stressors on his first day of exercise. Rashawd mentioned his energy is improving.  Pt has no other complaints during exercise at cardiac rehab at this time.      Expected Outcomes Celeste will have controlled or decreased depression uponcompletion of cardiac rehab. [place] will have controlled or decreased depression uponcompletion of cardiac rehab.      Interventions Stress management education;Encouraged to attend Cardiac Rehabilitation for the exercise;Relaxation education Stress management education;Encouraged to attend Cardiac Rehabilitation for the exercise;Relaxation education      Continue Psychosocial Services  Follow up required by staff No Follow up required         Psychosocial Discharge (Final Psychosocial Re-Evaluation):  Psychosocial Re-Evaluation - 08/23/23 1643       Psychosocial Re-Evaluation   Current issues with History of Depression  Comments Niklas mentioned his energy is improving.  Pt has no other complaints during exercise at cardiac rehab at this time.    Expected Outcomes Dinnis will have controlled or decreased depression uponcompletion of cardiac rehab.    Interventions Stress management education;Encouraged to attend Cardiac Rehabilitation for the exercise;Relaxation education    Continue Psychosocial Services  No Follow up required          Vocational  Rehabilitation: Provide vocational rehab assistance to qualifying candidates.   Vocational Rehab Evaluation & Intervention:  Vocational Rehab - 07/25/23 1131       Initial Vocational Rehab Evaluation & Intervention   Assessment shows need for Vocational Rehabilitation No   retired         Education: Education Goals: Education classes will be provided on a weekly basis, covering required topics. Participant will state understanding/return demonstration of topics presented.    Education     Row Name 08/01/23 1300     Education   Cardiac Education Topics Pritikin   Geographical information systems officer Psychosocial   Psychosocial Workshop Focused Goals, Sustainable Changes   Instruction Review Code 1- Verbalizes Understanding   Class Start Time 1155   Class Stop Time 1235   Class Time Calculation (min) 40 min    Row Name 08/03/23 1300     Education   Cardiac Education Topics Pritikin   Customer service manager   Weekly Topic Comforting Weekend Breakfasts   Instruction Review Code 1- Verbalizes Understanding   Class Start Time 1145   Class Stop Time 1220   Class Time Calculation (min) 35 min    Row Name 08/10/23 1300     Education   Cardiac Education Topics Pritikin   Customer service manager   Weekly Topic Fast Evening Meals   Instruction Review Code 1- Verbalizes Understanding   Class Start Time 1145   Class Stop Time 1225   Class Time Calculation (min) 40 min    Row Name 08/12/23 1100     Education   Cardiac Education Topics Pritikin   Licensed conveyancer Nutrition   Nutrition Vitamins and Minerals   Instruction Review Code 1- Verbalizes Understanding   Class Start Time 1145   Class Stop Time 1225   Class Time Calculation (min) 40 min    Row Name 08/15/23 1600     Education   Cardiac  Education Topics Pritikin   Glass blower/designer Nutrition   Nutrition Workshop Fueling a Forensic psychologist   Instruction Review Code 1- Tax inspector   Class Start Time 1145   Class Stop Time 1230   Class Time Calculation (min) 45 min    Row Name 08/17/23 1500     Education   Cardiac Education Topics Pritikin   Customer service manager   Weekly Topic International Cuisine- Spotlight on the [employer] Zones   Instruction Review Code 1- Verbalizes Understanding   Class Start Time 1145   Class Stop Time 1225   Class Time Calculation (min) 40 min    Row Name 08/22/23 1100     Education   Cardiac Education Topics --   Select --  Workshops   Programmer, systems --   Physiological scientist --   Psychosocial Workshop --   Instruction Review Code --    Row Name 08/24/23 1100     Education   Cardiac Education Topics Pritikin   Chiropodist   Instruction Review Code 1- Verbalizes Understanding   Class Start Time 1145   Class Stop Time 1225   Class Time Calculation (min) 40 min    Row Name 08/29/23 1200     Education   Cardiac Education Topics Pritikin   Hospital doctor Education   General Education Hypertension and Heart Disease   Instruction Review Code 1- Verbalizes Understanding   Class Start Time 1150   Class Stop Time 1225   Class Time Calculation (min) 35 min      Core Videos: Exercise    Move It!  Clinical staff conducted group or individual video education with verbal and written material and guidebook.  Patient learns the recommended Pritikin exercise program. Exercise with the goal of living a long, healthy life. Some of the health benefits of exercise include controlled diabetes, healthier blood pressure levels, improved cholesterol  levels, improved heart and lung capacity, improved sleep, and better body composition. Everyone should speak with their doctor before starting or changing an exercise routine.  Biomechanical Limitations Clinical staff conducted group or individual video education with verbal and written material and guidebook.  Patient learns how biomechanical limitations can impact exercise and how we can mitigate and possibly overcome limitations to have an impactful and balanced exercise routine.  Body Composition Clinical staff conducted group or individual video education with verbal and written material and guidebook.  Patient learns that body composition (ratio of muscle mass to fat mass) is a key component to assessing overall fitness, rather than body weight alone. Increased fat mass, especially visceral belly fat, can put us  at increased risk for metabolic syndrome, type 2 diabetes, heart disease, and even death. It is recommended to combine diet and exercise (cardiovascular and resistance training) to improve your body composition. Seek guidance from your physician and exercise physiologist before implementing an exercise routine.  Exercise Action Plan Clinical staff conducted group or individual video education with verbal and written material and guidebook.  Patient learns the recommended strategies to achieve and enjoy long-term exercise adherence, including variety, self-motivation, self-efficacy, and positive decision making. Benefits of exercise include fitness, good health, weight management, more energy, better sleep, less stress, and overall well-being.  Medical   Heart Disease Risk Reduction Clinical staff conducted group or individual video education with verbal and written material and guidebook.  Patient learns our heart is our most vital organ as it circulates oxygen, nutrients, white blood cells, and hormones throughout the entire body, and carries waste away. Data supports a plant-based  eating plan like the Pritikin Program for its effectiveness in slowing progression of and reversing heart disease. The video provides a number of recommendations to address heart disease.   Metabolic Syndrome and Belly Fat  Clinical staff conducted group or individual video education with verbal and written material and guidebook.  Patient learns what metabolic syndrome is, how it leads to heart disease, and how one can reverse it and keep it from coming back. You have metabolic syndrome if you have 3 of the following 5 criteria: abdominal obesity,  high blood pressure, high triglycerides, low HDL cholesterol, and high blood sugar.  Hypertension and Heart Disease Clinical staff conducted group or individual video education with verbal and written material and guidebook.  Patient learns that high blood pressure, or hypertension, is very common in the [place] . Hypertension is largely due to excessive salt intake, but other important risk factors include being overweight, physical inactivity, drinking too much alcohol, smoking, and not eating enough potassium from fruits and vegetables. High blood pressure is a leading risk factor for heart attack, stroke, congestive heart failure, dementia, kidney failure, and premature death. Long-term effects of excessive salt intake include stiffening of the arteries and thickening of heart muscle and organ damage. Recommendations include ways to reduce hypertension and the risk of heart disease.  Diseases of Our Time - Focusing on Diabetes Clinical staff conducted group or individual video education with verbal and written material and guidebook.  Patient learns why the best way to stop diseases of our time is prevention, through food and other lifestyle changes. Medicine (such as prescription pills and surgeries) is often only a Band-Aid on the problem, not a long-term solution. Most common diseases of our time include obesity, type 2 diabetes, hypertension,  heart disease, and cancer. The Pritikin Program is recommended and has been proven to help reduce, reverse, and/or prevent the damaging effects of metabolic syndrome.  Nutrition   Overview of the Pritikin Eating Plan  Clinical staff conducted group or individual video education with verbal and written material and guidebook.  Patient learns about the Pritikin Eating Plan for disease risk reduction. The Pritikin Eating Plan emphasizes a wide variety of unrefined, minimally-processed carbohydrates, like fruits, vegetables, whole grains, and legumes. Go, Caution, and Stop food choices are explained. Plant-based and lean animal proteins are emphasized. Rationale provided for low sodium intake for blood pressure control, low added sugars for blood sugar stabilization, and low added fats and oils for coronary artery disease risk reduction and weight management.  Calorie Density  Clinical staff conducted group or individual video education with verbal and written material and guidebook.  Patient learns about calorie density and how it impacts the Pritikin Eating Plan. Knowing the characteristics of the food you choose will help you decide whether those foods will lead to weight gain or weight loss, and whether you want to consume more or less of them. Weight loss is usually a side effect of the Pritikin Eating Plan because of its focus on low calorie-dense foods.  Label Reading  Clinical staff conducted group or individual video education with verbal and written material and guidebook.  Patient learns about the Pritikin recommended label reading guidelines and corresponding recommendations regarding calorie density, added sugars, sodium content, and whole grains.  Dining Out - Part 1  Clinical staff conducted group or individual video education with verbal and written material and guidebook.  Patient learns that restaurant meals can be sabotaging because they can be so high in calories, fat, sodium, and/or  sugar. Patient learns recommended strategies on how to positively address this and avoid unhealthy pitfalls.  Facts on Fats  Clinical staff conducted group or individual video education with verbal and written material and guidebook.  Patient learns that lifestyle modifications can be just as effective, if not more so, as many medications for lowering your risk of heart disease. A Pritikin lifestyle can help to reduce your risk of inflammation and atherosclerosis (cholesterol build-up, or plaque, in the artery walls). Lifestyle interventions such as dietary choices and physical activity  address the cause of atherosclerosis. A review of the types of fats and their impact on blood cholesterol levels, along with dietary recommendations to reduce fat intake is also included.  Nutrition Action Plan  Clinical staff conducted group or individual video education with verbal and written material and guidebook.  Patient learns how to incorporate Pritikin recommendations into their lifestyle. Recommendations include planning and keeping personal health goals in mind as an important part of their success.  Healthy Mind-Set    Healthy Minds, Bodies, Hearts  Clinical staff conducted group or individual video education with verbal and written material and guidebook.  Patient learns how to identify when they are stressed. Video will discuss the impact of that stress, as well as the many benefits of stress management. Patient will also be introduced to stress management techniques. The way we think, act, and feel has an impact on our hearts.  How Our Thoughts Can Heal Our Hearts  Clinical staff conducted group or individual video education with verbal and written material and guidebook.  Patient learns that negative thoughts can cause depression and anxiety. This can result in negative lifestyle behavior and serious health problems. Cognitive behavioral therapy is an effective method to help control our thoughts in  order to change and improve our emotional outlook.  Additional Videos:  Exercise    Improving Performance  Clinical staff conducted group or individual video education with verbal and written material and guidebook.  Patient learns to use a non-linear approach by alternating intensity levels and lengths of time spent exercising to help burn more calories and lose more body fat. Cardiovascular exercise helps improve heart health, metabolism, hormonal balance, blood sugar control, and recovery from fatigue. Resistance training improves strength, endurance, balance, coordination, reaction time, metabolism, and muscle mass. Flexibility exercise improves circulation, posture, and balance. Seek guidance from your physician and exercise physiologist before implementing an exercise routine and learn your capabilities and proper form for all exercise.  Introduction to Yoga  Clinical staff conducted group or individual video education with verbal and written material and guidebook.  Patient learns about yoga, a discipline of the coming together of mind, breath, and body. The benefits of yoga include improved flexibility, improved range of motion, better posture and core strength, increased lung function, weight loss, and positive self-image. Yoga's heart health benefits include lowered blood pressure, healthier heart rate, decreased cholesterol and triglyceride levels, improved immune function, and reduced stress. Seek guidance from your physician and exercise physiologist before implementing an exercise routine and learn your capabilities and proper form for all exercise.  Medical   Aging: Enhancing Your Quality of Life  Clinical staff conducted group or individual video education with verbal and written material and guidebook.  Patient learns key strategies and recommendations to stay in good physical health and enhance quality of life, such as prevention strategies, having an advocate, securing a Health  Care Proxy and Power of Attorney, and keeping a list of medications and system for tracking them. It also discusses how to avoid risk for bone loss.  Biology of Weight Control  Clinical staff conducted group or individual video education with verbal and written material and guidebook.  Patient learns that weight gain occurs because we consume more calories than we burn (eating more, moving less). Even if your body weight is normal, you may have higher ratios of fat compared to muscle mass. Too much body fat puts you at increased risk for cardiovascular disease, heart attack, stroke, type 2 diabetes, and obesity-related cancers.  In addition to exercise, following the Pritikin Eating Plan can help reduce your risk.  Decoding Lab Results  Clinical staff conducted group or individual video education with verbal and written material and guidebook.  Patient learns that lab test reflects one measurement whose values change over time and are influenced by many factors, including medication, stress, sleep, exercise, food, hydration, pre-existing medical conditions, and more. It is recommended to use the knowledge from this video to become more involved with your lab results and evaluate your numbers to speak with your doctor.   Diseases of Our Time - Overview  Clinical staff conducted group or individual video education with verbal and written material and guidebook.  Patient learns that according to the CDC, 50% to 70% of chronic diseases (such as obesity, type 2 diabetes, elevated lipids, hypertension, and heart disease) are avoidable through lifestyle improvements including healthier food choices, listening to satiety cues, and increased physical activity.  Sleep Disorders Clinical staff conducted group or individual video education with verbal and written material and guidebook.  Patient learns how good quality and duration of sleep are important to overall health and well-being. Patient also learns  about sleep disorders and how they impact health along with recommendations to address them, including discussing with a physician.  Nutrition  Dining Out - Part 2 Clinical staff conducted group or individual video education with verbal and written material and guidebook.  Patient learns how to plan ahead and communicate in order to maximize their dining experience in a healthy and nutritious manner. Included are recommended food choices based on the type of restaurant the patient is visiting.   Fueling a Banker conducted group or individual video education with verbal and written material and guidebook.  There is a strong connection between our food choices and our health. Diseases like obesity and type 2 diabetes are very prevalent and are in large-part due to lifestyle choices. The Pritikin Eating Plan provides plenty of food and hunger-curbing satisfaction. It is easy to follow, affordable, and helps reduce health risks.  Menu Workshop  Clinical staff conducted group or individual video education with verbal and written material and guidebook.  Patient learns that restaurant meals can sabotage health goals because they are often packed with calories, fat, sodium, and sugar. Recommendations include strategies to plan ahead and to communicate with the manager, chef, or server to help order a healthier meal.  Planning Your Eating Strategy  Clinical staff conducted group or individual video education with verbal and written material and guidebook.  Patient learns about the Pritikin Eating Plan and its benefit of reducing the risk of disease. The Pritikin Eating Plan does not focus on calories. Instead, it emphasizes high-quality, nutrient-rich foods. By knowing the characteristics of the foods, we choose, we can determine their calorie density and make informed decisions.  Targeting Your Nutrition Priorities  Clinical staff conducted group or individual video education with  verbal and written material and guidebook.  Patient learns that lifestyle habits have a tremendous impact on disease risk and progression. This video provides eating and physical activity recommendations based on your personal health goals, such as reducing LDL cholesterol, losing weight, preventing or controlling type 2 diabetes, and reducing high blood pressure.  Vitamins and Minerals  Clinical staff conducted group or individual video education with verbal and written material and guidebook.  Patient learns different ways to obtain key vitamins and minerals, including through a recommended healthy diet. It is important to discuss all supplements you  take with your doctor.   Healthy Mind-Set    Smoking Cessation  Clinical staff conducted group or individual video education with verbal and written material and guidebook.  Patient learns that cigarette smoking and tobacco addiction pose a serious health risk which affects millions of people. Stopping smoking will significantly reduce the risk of heart disease, lung disease, and many forms of cancer. Recommended strategies for quitting are covered, including working with your doctor to develop a successful plan.  Culinary   Becoming a Set designer conducted group or individual video education with verbal and written material and guidebook.  Patient learns that cooking at home can be healthy, cost-effective, quick, and puts them in control. Keys to cooking healthy recipes will include looking at your recipe, assessing your equipment needs, planning ahead, making it simple, choosing cost-effective seasonal ingredients, and limiting the use of added fats, salts, and sugars.  Cooking - Breakfast and Snacks  Clinical staff conducted group or individual video education with verbal and written material and guidebook.  Patient learns how important breakfast is to satiety and nutrition through the entire day. Recommendations include key  foods to eat during breakfast to help stabilize blood sugar levels and to prevent overeating at meals later in the day. Planning ahead is also a key component.  Cooking - Educational psychologist conducted group or individual video education with verbal and written material and guidebook.  Patient learns eating strategies to improve overall health, including an approach to cook more at home. Recommendations include thinking of animal protein as a side on your plate rather than center stage and focusing instead on lower calorie dense options like vegetables, fruits, whole grains, and plant-based proteins, such as beans. Making sauces in large quantities to freeze for later and leaving the skin on your vegetables are also recommended to maximize your experience.  Cooking - Healthy Salads and Dressing Clinical staff conducted group or individual video education with verbal and written material and guidebook.  Patient learns that vegetables, fruits, whole grains, and legumes are the foundations of the Pritikin Eating Plan. Recommendations include how to incorporate each of these in flavorful and healthy salads, and how to create homemade salad dressings. Proper handling of ingredients is also covered. Cooking - Soups and [employer] - Soups and Desserts Clinical staff conducted group or individual video education with verbal and written material and guidebook.  Patient learns that Pritikin soups and desserts make for easy, nutritious, and delicious snacks and meal components that are low in sodium, fat, sugar, and calorie density, while high in vitamins, minerals, and filling fiber. Recommendations include simple and healthy ideas for soups and desserts.   Overview     The Pritikin Solution Program Overview Clinical staff conducted group or individual video education with verbal and written material and guidebook.  Patient learns that the results of the Pritikin Program have been  documented in more than 100 articles published in peer-reviewed journals, and the benefits include reducing risk factors for (and, in some cases, even reversing) high cholesterol, high blood pressure, type 2 diabetes, obesity, and more! An overview of the three key pillars of the Pritikin Program will be covered: eating well, doing regular exercise, and having a healthy mind-set.  WORKSHOPS  Exercise: Exercise Basics: Building Your Action Plan Clinical staff led group instruction and group discussion with PowerPoint presentation and patient guidebook. To enhance the learning environment the use of posters, models and videos may be added. At  the conclusion of this workshop, patients will comprehend the difference between physical activity and exercise, as well as the benefits of incorporating both, into their routine. Patients will understand the FITT (Frequency, Intensity, Time, and Type) principle and how to use it to build an exercise action plan. In addition, safety concerns and other considerations for exercise and cardiac rehab will be addressed by the presenter. The purpose of this lesson is to promote a comprehensive and effective weekly exercise routine in order to improve patients' overall level of fitness.   Managing Heart Disease: Your Path to a Healthier Heart Clinical staff led group instruction and group discussion with PowerPoint presentation and patient guidebook. To enhance the learning environment the use of posters, models and videos may be added.At the conclusion of this workshop, patients will understand the anatomy and physiology of the heart. Additionally, they will understand how Pritikin's three pillars impact the risk factors, the progression, and the management of heart disease.  The purpose of this lesson is to provide a high-level overview of the heart, heart disease, and how the Pritikin lifestyle positively impacts risk factors.  Exercise Biomechanics Clinical  staff led group instruction and group discussion with PowerPoint presentation and patient guidebook. To enhance the learning environment the use of posters, models and videos may be added. Patients will learn how the structural parts of their bodies function and how these functions impact their daily activities, movement, and exercise. Patients will learn how to promote a neutral spine, learn how to manage pain, and identify ways to improve their physical movement in order to promote healthy living. The purpose of this lesson is to expose patients to common physical limitations that impact physical activity. Participants will learn practical ways to adapt and manage aches and pains, and to minimize their effect on regular exercise. Patients will learn how to maintain good posture while sitting, walking, and lifting.  Balance Training and Fall Prevention  Clinical staff led group instruction and group discussion with PowerPoint presentation and patient guidebook. To enhance the learning environment the use of posters, models and videos may be added. At the conclusion of this workshop, patients will understand the importance of their sensorimotor skills (vision, proprioception, and the vestibular system) in maintaining their ability to balance as they age. Patients will apply a variety of balancing exercises that are appropriate for their current level of function. Patients will understand the common causes for poor balance, possible solutions to these problems, and ways to modify their physical environment in order to minimize their fall risk. The purpose of this lesson is to teach patients about the importance of maintaining balance as they age and ways to minimize their risk of falling.  WORKSHOPS   Nutrition:  Fueling a Ship broker led group instruction and group discussion with PowerPoint presentation and patient guidebook. To enhance the learning environment the use of  posters, models and videos may be added. Patients will review the foundational principles of the Pritikin Eating Plan and understand what constitutes a serving size in each of the food groups. Patients will also learn Pritikin-friendly foods that are better choices when away from home and review make-ahead meal and snack options. Calorie density will be reviewed and applied to three nutrition priorities: weight maintenance, weight loss, and weight gain. The purpose of this lesson is to reinforce (in a group setting) the key concepts around what patients are recommended to eat and how to apply these guidelines when away from home by planning and selecting  Pritikin-friendly options. Patients will understand how calorie density may be adjusted for different weight management goals.  Mindful Eating  Clinical staff led group instruction and group discussion with PowerPoint presentation and patient guidebook. To enhance the learning environment the use of posters, models and videos may be added. Patients will briefly review the concepts of the Pritikin Eating Plan and the importance of low-calorie dense foods. The concept of mindful eating will be introduced as well as the importance of paying attention to internal hunger signals. Triggers for non-hunger eating and techniques for dealing with triggers will be explored. The purpose of this lesson is to provide patients with the opportunity to review the basic principles of the Pritikin Eating Plan, discuss the value of eating mindfully and how to measure internal cues of hunger and fullness using the Hunger Scale. Patients will also discuss reasons for non-hunger eating and learn strategies to use for controlling emotional eating.  Targeting Your Nutrition Priorities Clinical staff led group instruction and group discussion with PowerPoint presentation and patient guidebook. To enhance the learning environment the use of posters, models and videos may be added.  Patients will learn how to determine their genetic susceptibility to disease by reviewing their family history. Patients will gain insight into the importance of diet as part of an overall healthy lifestyle in mitigating the impact of genetics and other environmental insults. The purpose of this lesson is to provide patients with the opportunity to assess their personal nutrition priorities by looking at their family history, their own health history and current risk factors. Patients will also be able to discuss ways of prioritizing and modifying the Pritikin Eating Plan for their highest risk areas  Menu  Clinical staff led group instruction and group discussion with PowerPoint presentation and patient guidebook. To enhance the learning environment the use of posters, models and videos may be added. Using menus brought in from [employer], or printed from [employer], patients will apply the Pritikin dining out guidelines that were presented in the [employer] video. Patients will also be able to practice these guidelines in a variety of provided scenarios. The purpose of this lesson is to provide patients with the opportunity to practice hands-on learning of the Pritikin Dining Out guidelines with actual menus and practice scenarios.  Label Reading Clinical staff led group instruction and group discussion with PowerPoint presentation and patient guidebook. To enhance the learning environment the use of posters, models and videos may be added. Patients will review and discuss the Pritikin label reading guidelines presented in [employer]ritikin's Label Reading Educational series video. Using fool labels brought in from local grocery stores and markets, patients will apply the label reading guidelines and determine if the packaged food meet the Pritikin guidelines. The purpose of this lesson is to provide patients with the opportunity to review, discuss, and practice hands-on learning of the  Pritikin Label Reading guidelines with actual packaged food labels. Cooking School  Pritikin's [employer] are designed to teach patients ways to prepare quick, simple, and affordable recipes at home. The importance of nutrition's role in chronic disease risk reduction is reflected in its emphasis in the overall Pritikin program. By learning how to prepare essential core Pritikin Eating Plan recipes, patients will increase control over what they eat; be able to customize the flavor of foods without the use of added salt, sugar, or fat; and improve the quality of the food they consume. By learning a set of core recipes which are easily  assembled, quickly prepared, and affordable, patients are more likely to prepare more healthy foods at home. These workshops focus on convenient breakfasts, simple entres, side dishes, and desserts which can be prepared with minimal effort and are consistent with nutrition recommendations for cardiovascular risk reduction. Cooking [employer] are taught by a Armed forces logistics/support/administrative officer (RD) who has been trained by the [employer]. The chef or RD has a clear understanding of the importance of minimizing - if not completely eliminating - added fat, sugar, and sodium in recipes. Throughout the series of Cooking School Workshop sessions, patients will learn about healthy ingredients and efficient methods of cooking to build confidence in their capability to prepare    Cooking School weekly topics:  Adding Flavor- Sodium-Free  Fast and Healthy Breakfasts  Powerhouse Plant-Based Proteins  Satisfying Salads and Dressings  Simple Sides and Sauces  International Cuisine-Spotlight on the [employer] Zones  Delicious Desserts  Savory Soups  [employer] - Meals in a Astronomer Appetizers and Snacks  Comforting Weekend Breakfasts  One-Pot Wonders   Fast Evening Meals  Landscape architect Your Pritikin Plate  WORKSHOPS    Healthy Mindset (Psychosocial):  Focused Goals, Sustainable Changes Clinical staff led group instruction and group discussion with PowerPoint presentation and patient guidebook. To enhance the learning environment the use of posters, models and videos may be added. Patients will be able to apply effective goal setting strategies to establish at least one personal goal, and then take consistent, meaningful action toward that goal. They will learn to identify common barriers to achieving personal goals and develop strategies to overcome them. Patients will also gain an understanding of how our mind-set can impact our ability to achieve goals and the importance of cultivating a positive and growth-oriented mind-set. The purpose of this lesson is to provide patients with a deeper understanding of how to set and achieve personal goals, as well as the tools and strategies needed to overcome common obstacles which may arise along the way.  From Head to Heart: The Power of a Healthy Outlook  Clinical staff led group instruction and group discussion with PowerPoint presentation and patient guidebook. To enhance the learning environment the use of posters, models and videos may be added. Patients will be able to recognize and describe the impact of emotions and mood on physical health. They will discover the importance of self-care and explore self-care practices which may work for them. Patients will also learn how to utilize the 4 C's to cultivate a healthier outlook and better manage stress and challenges. The purpose of this lesson is to demonstrate to patients how a healthy outlook is an essential part of maintaining good health, especially as they continue their cardiac rehab journey.  Healthy Sleep for a Healthy Heart Clinical staff led group instruction and group discussion with PowerPoint presentation and patient guidebook. To enhance the learning environment the use of posters, models and videos may be  added. At the conclusion of this workshop, patients will be able to demonstrate knowledge of the importance of sleep to overall health, well-being, and quality of life. They will understand the symptoms of, and treatments for, common sleep disorders. Patients will also be able to identify daytime and nighttime behaviors which impact sleep, and they will be able to apply these tools to help manage sleep-related challenges. The purpose of this lesson is to provide patients with a general overview of sleep and outline the importance of quality sleep. Patients will learn  about a few of the most common sleep disorders. Patients will also be introduced to the concept of "sleep hygiene," and discover ways to self-manage certain sleeping problems through simple daily behavior changes. Finally, the workshop will motivate patients by clarifying the links between quality sleep and their goals of heart-healthy living.   Recognizing and Reducing Stress Clinical staff led group instruction and group discussion with PowerPoint presentation and patient guidebook. To enhance the learning environment the use of posters, models and videos may be added. At the conclusion of this workshop, patients will be able to understand the types of stress reactions, differentiate between acute and chronic stress, and recognize the impact that chronic stress has on their health. They will also be able to apply different coping mechanisms, such as reframing negative self-talk. Patients will have the opportunity to practice a variety of stress management techniques, such as deep abdominal breathing, progressive muscle relaxation, and/or guided imagery.  The purpose of this lesson is to educate patients on the role of stress in their lives and to provide healthy techniques for coping with it.  Learning Barriers/Preferences:  Learning Barriers/Preferences - 07/25/23 1131       Learning Barriers/Preferences   Learning Barriers Sight   readers    Learning Preferences Audio;Computer/Internet;Group Instruction;Skilled Demonstration;Verbal Instruction;Video;Written Material;Pictoral;Individual Instruction          Education Topics:  Knowledge Questionnaire Score:  Knowledge Questionnaire Score - 07/25/23 1131       Knowledge Questionnaire Score   Pre Score 20/24          Core Components/Risk Factors/Patient Goals at Admission:  Personal Goals and Risk Factors at Admission - 07/25/23 1132       Core Components/Risk Factors/Patient Goals on Admission    Weight Management Yes;Weight Loss;Obesity    Intervention Weight Management: Develop a combined nutrition and exercise program designed to reach desired caloric intake, while maintaining appropriate intake of nutrient and fiber, sodium and fats, and appropriate energy expenditure required for the weight goal.;Weight Management: Provide education and appropriate resources to help participant work on and attain dietary goals.;Weight Management/Obesity: Establish reasonable short term and long term weight goals.;Obesity: Provide education and appropriate resources to help participant work on and attain dietary goals.    Goal Weight: Long Term 185 lb (83.9 kg)   pt goal   Expected Outcomes Short Term: Continue to assess and modify interventions until short term weight is achieved;Long Term: Adherence to nutrition and physical activity/exercise program aimed toward attainment of established weight goal;Weight Loss: Understanding of general recommendations for a balanced deficit meal plan, which promotes 1-2 lb weight loss per week and includes a negative energy balance of [phone] kcal/d;Understanding recommendations for meals to include 15-35% energy as protein, 25-35% energy from fat, 35-60% energy from carbohydrates, less than 200mg  of dietary cholesterol, 20-35 gm of total fiber daily;Understanding of distribution of calorie intake throughout the day with the consumption of 4-5  meals/snacks    Hypertension Yes    Intervention Provide education on lifestyle modifcations including regular physical activity/exercise, weight management, moderate sodium restriction and increased consumption of fresh fruit, vegetables, and low fat dairy, alcohol moderation, and smoking cessation.;Monitor prescription use compliance.    Expected Outcomes Short Term: Continued assessment and intervention until BP is < 140/32mm HG in hypertensive participants. < 130/23mm HG in hypertensive participants with diabetes, heart failure or chronic kidney disease.;Long Term: Maintenance of blood pressure at goal levels.    Lipids Yes    Intervention Provide education and support for  participant on nutrition & aerobic/resistive exercise along with prescribed medications to achieve LDL 70mg , HDL >40mg .    Expected Outcomes Short Term: Participant states understanding of desired cholesterol values and is compliant with medications prescribed. Participant is following exercise prescription and nutrition guidelines.;Long Term: Cholesterol controlled with medications as prescribed, with individualized exercise RX and with personalized nutrition plan. Value goals: LDL < 70mg , HDL > 40 mg.          Core Components/Risk Factors/Patient Goals Review:   Goals and Risk Factor Review     Row Name 08/01/23 1819 08/23/23 1644           Core Components/Risk Factors/Patient Goals Review   Personal Goals Review Weight Management/Obesity;Hypertension;Lipids Weight Management/Obesity;Hypertension;Lipids      Review Terrius started cardiac rehab on 08/01/23. Erhard did well with exercise. Vital signs were stable. Mosi did exceed his target heart rate as Dr Wendel discontinued his betablocker at his last office visit. Rahiem is doing well with exercise at cardiac rehab. Vital signs have been stable. Taeden has received an increased target heart rate goal from cardiologist.  BPs stable, pt has increased his MET level.       Expected Outcomes Zein will continue to participate in cardiac rehab for exercise, nutrition and lifestyle modificaitons Taysom will continue to participate in cardiac rehab for exercise, nutrition and lifestyle modificaitons         Core Components/Risk Factors/Patient Goals at Discharge (Final Review):   Goals and Risk Factor Review - 08/23/23 1644       Core Components/Risk Factors/Patient Goals Review   Personal Goals Review Weight Management/Obesity;Hypertension;Lipids    Review Govanni is doing well with exercise at cardiac rehab. Vital signs have been stable. Tayten has received an increased target heart rate goal from cardiologist.  BPs stable, pt has increased his MET level.    Expected Outcomes Jaidev will continue to participate in cardiac rehab for exercise, nutrition and lifestyle modificaitons          ITP Comments:  ITP Comments     Row Name 07/25/23 1125 08/01/23 1135 08/23/23 1642       ITP Comments Dr. Wilbert Bihari medical director. Introduction to pritikin education/intensive cardiac rehab. Initial orientation packet reviewed with patient. 30-day ITP reviewe. Jeremia tolerated low intensity exercise fairly well but exceeded target heart rate range. Will continue to monitor. 30-day ITP reviewed. Pt has good attendance and participation with exercise at cardiac rehab        Comments: See ITP Comments

## 2023-08-31 ENCOUNTER — Encounter (HOSPITAL_COMMUNITY)
Admission: RE | Admit: 2023-08-31 | Discharge: 2023-08-31 | Disposition: A | Discharge status: Home / Self Care | Source: Ambulatory Visit | Attending: Internal Medicine

## 2023-08-31 DIAGNOSIS — R000 Tachycardia, unspecified: Secondary | ICD-10-CM | POA: Diagnosis not present

## 2023-08-31 DIAGNOSIS — Z48812 Encounter for surgical aftercare following surgery on the circulatory system: Secondary | ICD-10-CM | POA: Diagnosis not present

## 2023-08-31 DIAGNOSIS — Z951 Presence of aortocoronary bypass graft: Secondary | ICD-10-CM | POA: Diagnosis not present

## 2023-09-01 ENCOUNTER — Ambulatory Visit: Payer: Self-pay | Admitting: Internal Medicine

## 2023-09-01 LAB — COMPREHENSIVE METABOLIC PANEL WITH GFR
ALT: 18 IU/L (ref 0–44)
AST: 19 IU/L (ref 0–40)
Albumin: 4 g/dL (ref 3.8–4.8)
Alkaline Phosphatase: 84 IU/L (ref 44–121)
BUN/Creatinine Ratio: 35 — AB (ref 10–24)
BUN: 29 mg/dL — AB (ref 8–27)
Bilirubin Total: 0.5 mg/dL (ref 0.0–1.2)
CO2: 20 mmol/L (ref 20–29)
Calcium: 8.9 mg/dL (ref 8.6–10.2)
Chloride: 103 mmol/L (ref 96–106)
Creatinine, Ser: 0.84 mg/dL (ref 0.76–1.27)
Globulin, Total: 2.5 g/dL (ref 1.5–4.5)
Glucose: 94 mg/dL (ref 70–99)
Potassium: 4.4 mmol/L (ref 3.5–5.2)
Sodium: 140 mmol/L (ref 134–144)
Total Protein: 6.5 g/dL (ref 6.0–8.5)
eGFR: 89 mL/min/1.73 (ref >59)

## 2023-09-01 LAB — CBC
Hematocrit: 40.6 % (ref 37.5–51.0)
Hemoglobin: 12.8 g/dL — ABNORMAL LOW (ref 13.0–17.7)
MCH: 30 pg (ref 26.6–33.0)
MCHC: 31.5 g/dL (ref 31.5–35.7)
MCV: 95 fL (ref 79–97)
Platelets: 200 x10E3/uL (ref 150–450)
RBC: 4.26 x10E6/uL (ref 4.14–5.80)
RDW: 12 % (ref 11.6–15.4)
WBC: 8 x10E3/uL (ref 3.4–10.8)

## 2023-09-01 LAB — TSH RFX ON ABNORMAL TO FREE T4: TSH: 2.18 u[IU]/mL (ref 0.450–4.500)

## 2023-09-02 ENCOUNTER — Encounter (HOSPITAL_COMMUNITY)
Admission: RE | Admit: 2023-09-02 | Discharge: 2023-09-02 | Disposition: A | Discharge status: Home / Self Care | Source: Ambulatory Visit | Attending: Internal Medicine | Admitting: Internal Medicine

## 2023-09-02 DIAGNOSIS — Z48812 Encounter for surgical aftercare following surgery on the circulatory system: Secondary | ICD-10-CM | POA: Diagnosis not present

## 2023-09-02 DIAGNOSIS — Z951 Presence of aortocoronary bypass graft: Secondary | ICD-10-CM | POA: Diagnosis not present

## 2023-09-05 ENCOUNTER — Encounter (HOSPITAL_COMMUNITY)
Admission: RE | Admit: 2023-09-05 | Discharge: 2023-09-05 | Disposition: A | Discharge status: Home / Self Care | Source: Ambulatory Visit | Attending: Internal Medicine

## 2023-09-05 DIAGNOSIS — Z951 Presence of aortocoronary bypass graft: Secondary | ICD-10-CM

## 2023-09-05 DIAGNOSIS — Z48812 Encounter for surgical aftercare following surgery on the circulatory system: Secondary | ICD-10-CM | POA: Diagnosis not present

## 2023-09-05 MED ORDER — METOPROLOL TARTRATE 50 MG PO TABS: 50.0000 mg | ORAL_TABLET | Freq: Two times a day (BID) | ORAL | 3 refills | Status: AC

## 2023-09-05 NOTE — Telephone Encounter (Signed)
 Left a detailed message for patient as well replied to his MyChart message.

## 2023-09-07 ENCOUNTER — Encounter (HOSPITAL_COMMUNITY)
Admission: RE | Admit: 2023-09-07 | Discharge: 2023-09-07 | Disposition: A | Discharge status: Home / Self Care | Source: Ambulatory Visit | Attending: Internal Medicine | Admitting: Internal Medicine

## 2023-09-07 DIAGNOSIS — Z951 Presence of aortocoronary bypass graft: Secondary | ICD-10-CM | POA: Diagnosis not present

## 2023-09-07 DIAGNOSIS — Z48812 Encounter for surgical aftercare following surgery on the circulatory system: Secondary | ICD-10-CM | POA: Diagnosis not present

## 2023-09-09 ENCOUNTER — Encounter (HOSPITAL_COMMUNITY)
Admission: RE | Admit: 2023-09-09 | Discharge: 2023-09-09 | Disposition: A | Discharge status: Home / Self Care | Source: Ambulatory Visit | Attending: Internal Medicine | Admitting: Internal Medicine

## 2023-09-09 DIAGNOSIS — Z48812 Encounter for surgical aftercare following surgery on the circulatory system: Secondary | ICD-10-CM | POA: Diagnosis not present

## 2023-09-09 DIAGNOSIS — Z951 Presence of aortocoronary bypass graft: Secondary | ICD-10-CM | POA: Diagnosis not present

## 2023-09-12 ENCOUNTER — Encounter (HOSPITAL_COMMUNITY)
Admission: RE | Admit: 2023-09-12 | Discharge: 2023-09-12 | Disposition: A | Discharge status: Home / Self Care | Source: Ambulatory Visit | Attending: Internal Medicine

## 2023-09-12 DIAGNOSIS — Z951 Presence of aortocoronary bypass graft: Secondary | ICD-10-CM

## 2023-09-12 DIAGNOSIS — Z48812 Encounter for surgical aftercare following surgery on the circulatory system: Secondary | ICD-10-CM | POA: Diagnosis not present

## 2023-09-14 ENCOUNTER — Encounter (HOSPITAL_COMMUNITY)
Admission: RE | Admit: 2023-09-14 | Discharge: 2023-09-14 | Disposition: A | Discharge status: Home / Self Care | Source: Ambulatory Visit | Attending: Internal Medicine | Admitting: Internal Medicine

## 2023-09-14 DIAGNOSIS — Z951 Presence of aortocoronary bypass graft: Secondary | ICD-10-CM

## 2023-09-14 DIAGNOSIS — Z48812 Encounter for surgical aftercare following surgery on the circulatory system: Secondary | ICD-10-CM | POA: Diagnosis not present

## 2023-09-16 ENCOUNTER — Encounter (HOSPITAL_COMMUNITY)
Admission: RE | Admit: 2023-09-16 | Discharge: 2023-09-16 | Disposition: A | Discharge status: Home / Self Care | Source: Ambulatory Visit | Attending: Internal Medicine | Admitting: Internal Medicine

## 2023-09-16 DIAGNOSIS — Z951 Presence of aortocoronary bypass graft: Secondary | ICD-10-CM | POA: Insufficient documentation

## 2023-09-16 DIAGNOSIS — Z48812 Encounter for surgical aftercare following surgery on the circulatory system: Secondary | ICD-10-CM | POA: Insufficient documentation

## 2023-09-19 ENCOUNTER — Encounter (HOSPITAL_COMMUNITY)
Admission: RE | Admit: 2023-09-19 | Discharge: 2023-09-19 | Disposition: A | Discharge status: Home / Self Care | Source: Ambulatory Visit | Attending: Internal Medicine | Admitting: Internal Medicine

## 2023-09-19 ENCOUNTER — Other Ambulatory Visit: Payer: Self-pay | Admitting: Nurse Practitioner

## 2023-09-19 DIAGNOSIS — Z951 Presence of aortocoronary bypass graft: Secondary | ICD-10-CM | POA: Diagnosis not present

## 2023-09-19 DIAGNOSIS — Z48812 Encounter for surgical aftercare following surgery on the circulatory system: Secondary | ICD-10-CM | POA: Diagnosis not present

## 2023-09-21 ENCOUNTER — Encounter (HOSPITAL_COMMUNITY)
Admission: RE | Admit: 2023-09-21 | Discharge: 2023-09-21 | Disposition: A | Discharge status: Home / Self Care | Source: Ambulatory Visit | Attending: Internal Medicine

## 2023-09-21 DIAGNOSIS — Z48812 Encounter for surgical aftercare following surgery on the circulatory system: Secondary | ICD-10-CM | POA: Diagnosis not present

## 2023-09-21 DIAGNOSIS — Z951 Presence of aortocoronary bypass graft: Secondary | ICD-10-CM | POA: Diagnosis not present

## 2023-09-21 NOTE — Addendum Note (Signed)
 Encounter addended by: Valere Arnoldo HERO on: 09/21/2023 2:40 PM  Actions taken: Flowsheet accepted

## 2023-09-23 ENCOUNTER — Encounter (HOSPITAL_COMMUNITY)
Admission: RE | Admit: 2023-09-23 | Discharge: 2023-09-23 | Disposition: A | Discharge status: Home / Self Care | Source: Ambulatory Visit | Attending: Internal Medicine | Admitting: Internal Medicine

## 2023-09-23 DIAGNOSIS — Z48812 Encounter for surgical aftercare following surgery on the circulatory system: Secondary | ICD-10-CM | POA: Diagnosis not present

## 2023-09-23 DIAGNOSIS — Z951 Presence of aortocoronary bypass graft: Secondary | ICD-10-CM

## 2023-09-26 ENCOUNTER — Other Ambulatory Visit: Payer: Self-pay | Admitting: Nurse Practitioner

## 2023-09-26 ENCOUNTER — Encounter (HOSPITAL_COMMUNITY)
Admission: RE | Admit: 2023-09-26 | Discharge: 2023-09-26 | Disposition: A | Discharge status: Home / Self Care | Source: Ambulatory Visit | Attending: Internal Medicine

## 2023-09-26 DIAGNOSIS — Z951 Presence of aortocoronary bypass graft: Secondary | ICD-10-CM

## 2023-09-26 DIAGNOSIS — Z48812 Encounter for surgical aftercare following surgery on the circulatory system: Secondary | ICD-10-CM | POA: Diagnosis not present

## 2023-09-27 NOTE — Progress Notes (Signed)
 Cardiac Individual Treatment Plan  Patient Details  Name: Tim Walters MRN: [id number] Date of Birth: 1945-10-29 Referring Provider:   Flowsheet Row INTENSIVE CARDIAC REHAB ORIENT from 07/25/2023 in [facility] for [facility], V[facility], & [facility]  Referring Provider Lurena Red, MD    Initial Encounter Date:  Flowsheet Row INTENSIVE CARDIAC REHAB ORIENT from 07/25/2023 in [facility] for Heart, Vascular, & Lung [facility]  Date 07/25/23    Visit Diagnosis: S/P CABG x 4  Patient's Home Medications on Admission:  Current Outpatient Medications:    acetaminophen  (TYLENOL ) 325 MG tablet, Take 2 tablets (650 mg total) by mouth every 6 (six) hours as needed., Disp: , Rfl:    aspirin  EC 325 MG tablet, Take 1 tablet (325 mg total) by mouth daily. (Patient taking differently: Take 81 mg by mouth daily.), Disp: , Rfl:    atorvastatin  (LIPITOR ) 80 MG tablet, TAKE 1 TABLET BY MOUTH ONCE  DAILY, Disp: 100 tablet, Rfl: 2   ezetimibe  (ZETIA ) 10 MG tablet, TAKE 1 TABLET BY MOUTH DAILY, Disp: 90 tablet, Rfl: 3   metoprolol  tartrate (LOPRESSOR ) 50 MG tablet, Take 1 tablet (50 mg total) by mouth 2 (two) times daily., Disp: 180 tablet, Rfl: 3   omeprazole (PRILOSEC) 20 MG capsule, Take 20 mg by mouth daily., Disp: , Rfl:    ramipril  (ALTACE ) 10 MG capsule, Take 1 capsule (10 mg total) by mouth daily., Disp: 90 capsule, Rfl: 3   sertraline  (ZOLOFT ) 50 MG tablet, TAKE 1 TABLET BY MOUTH DAILY, Disp: 90 tablet, Rfl: 3 No current facility-administered medications for this encounter.  Facility-Administered Medications Ordered in Other Encounters:    regadenoson  (LEXISCAN ) injection SOLN 0.4 mg, 0.4 mg, Intravenous, Once, Wyn, Jackee VEAR Raddle., NP  Past Medical History: Past Medical History:  Diagnosis Date   Anginal pain (HCC)    Arthritis    lt ankle   Basal cell carcinoma    x2 (excised)   Breast lump    Per records from [facility]    Colon polyp     Coronary artery disease    a. BMS to RCA 2003 with residual LAD/diag disease treated medically, normal EF.   COVID    Depression    Diverticulosis    Per records from [facility]    GERD (gastroesophageal reflux disease)    Hyperkalemia    a. K of 5.2 in 2017.   Hyperlipidemia    Hypertension    Idiopathic peripheral neuropathy    Per records from [facility]    Impaired fasting glucose    Per records from [facility]    Kidney stones    Monoclonal gammopathy of undetermined significance    Per records from [facility]    Myocardial infarction [facility]) 11/15/2001   Dr. Victory Sharps [facility])   OSA (obstructive sleep apnea)    Per [facility] New Patient Packet   Peripheral neuropathy    Left Foot, Per [facility] New Patient Packet   Pre-diabetes    Sleep apnea    had test several yr ago-said he did not need a cpap-still snores   Torn rotator cuff    Per [facility] New Patient Packet   Venous insufficiency of left leg    Per [facility] New Patient Packet   Wears glasses     Tobacco Use: Social History   Tobacco Use  Smoking Status Former   Current packs/day: 0.00   Types: Cigarettes   Start date: 02/15/1962  Quit date: 02/15/1974   Years since quitting: 49.6  Smokeless Tobacco Never    Labs: Review Flowsheet  More data exists      Latest Ref Rng & Units 11/09/2022 04/20/2023 05/17/2023 06/07/2023 06/08/2023  Labs for ITP Cardiac and Pulmonary Rehab  Cholestrol <200 mg/dL - 893  891  - -  LDL (calc) mg/dL (calc) - 41  33  - -  HDL-C > OR = 40 mg/dL - 54  61  - -  Trlycerides <150 mg/dL - 41  49  - -  Hemoglobin A1c 4.8 - 5.6 % 6.3  - 6.5  6.0  -  PH, Arterial 7.35 - 7.45 - - - - 7.319  7.357  7.383  7.411  7.409  7.358  7.324   PCO2 arterial 32 - 48 mmHg - - - - 41.0  38.9  39.2  35.9  35.5  38.7  45.0   Bicarbonate 20.0 - 28.0 mmol/L - - - - 21.0  21.8  23.5  22.8  22.5  24.7  21.7  23.4   TCO2 22 - 32 mmol/L - - - - 22  23  25  24  24  24  25  26  23  25  25  26     Acid-base deficit 0.0 - 2.0 mmol/L - - - - 5.0  3.0  2.0  2.0  2.0  1.0  3.0  3.0   O2 Saturation % - - - - 94  96  93  99  100  86  100  100     Details       Multiple values from one day are sorted in reverse-chronological order         Capillary Blood Glucose: Lab Results  Component Value Date   GLUCAP 106 (H) 06/10/2023   GLUCAP 138 (H) 06/10/2023   GLUCAP 108 (H) 06/10/2023   GLUCAP 143 (H) 06/10/2023   GLUCAP 129 (H) 06/10/2023     Exercise Target Goals: Exercise Program Goal: Individual exercise prescription set using results from initial 6 min walk test and THRR while considering  patient's activity barriers and safety.   Exercise Prescription Goal: Initial exercise prescription builds to 30-45 minutes a day of aerobic activity, 2-3 days per week.  Home exercise guidelines will be given to patient during program as part of exercise prescription that the participant will acknowledge.  Activity Barriers & Risk Stratification:  Activity Barriers & Cardiac Risk Stratification - 07/25/23 1127       Activity Barriers & Cardiac Risk Stratification   Activity Barriers Other (comment);Arthritis;Joint Problems;Back Problems    Comments sternal precautions    Cardiac Risk Stratification High   <5 METs on         6 Minute Walk:  6 Minute Walk     Row Name 07/25/23 1324         6 Minute Walk   Phase Initial     Distance 1466 feet     Walk Time 6 minutes     # of Rest Breaks 0     MPH 2.78     METS 2.55     RPE 11     Perceived Dyspnea  0     VO2 Peak 8.93     Symptoms No     Resting HR 52 bpm     Resting BP 118/58     Resting Oxygen Saturation  96 %     Exercise Oxygen Saturation  during 6 min walk 96 %     Max Ex. HR 94 bpm     Max Ex. BP 146/66     2 Minute Post BP 122/70        Oxygen Initial Assessment:   Oxygen Re-Evaluation:   Oxygen Discharge (Final Oxygen Re-Evaluation):   Initial Exercise Prescription:  Initial Exercise  Prescription - 07/25/23 1300       Date of Initial Exercise RX and Referring Provider   Date 07/25/23    Referring Provider Lurena Red, MD    Expected Discharge Date 10/19/23      Recumbant Elliptical   Level 1    RPM 50    Watts 80    Minutes 15    METs 2.2      Track   Laps 16    Minutes 15    METs 2      Prescription Details   Frequency (times per week) 3    Duration Progress to 30 minutes of continuous aerobic without signs/symptoms of physical distress      Intensity   THRR 40-80% of Max Heartrate 56-113    Ratings of Perceived Exertion 11-13    Perceived Dyspnea 0-4      Progression   Progression Continue progressive overload as per policy without signs/symptoms or physical distress.      Resistance Training   Training Prescription Yes    Weight 3    Reps 10-15          Perform Capillary Blood Glucose checks as needed.  Exercise Prescription Changes:   Exercise Prescription Changes     Row Name 08/01/23 1026 08/15/23 1033 08/29/23 1034 09/16/23 1029       Response to Exercise   Blood Pressure (Admit) 132/70 124/66 132/70 122/68    Blood Pressure (Exercise) 154/68 170/78 -- --    Blood Pressure (Exit) 122/80 112/60 110/60 102/60    Heart Rate (Admit) 93 bpm 83 bpm 90 bpm 53 bpm    Heart Rate (Exercise) 134 bpm 133 bpm 142 bpm 114 bpm    Heart Rate (Exit) 102 bpm 91 bpm 99 bpm 65 bpm    Rating of Perceived Exertion (Exercise) 10 12 12 12     Symptoms None None None None    Comments Off to a good start with exercise. Reviewed exercise prescription, goals, and THRR. Increased workload on recumbent elliptical today. -- Increased WL on recumbent elliptical today.    Duration Continue with 30 min of aerobic exercise without signs/symptoms of physical distress. Continue with 30 min of aerobic exercise without signs/symptoms of physical distress. Continue with 30 min of aerobic exercise without signs/symptoms of physical distress. Continue with 30 min of  aerobic exercise without signs/symptoms of physical distress.    Intensity THRR unchanged THRR unchanged THRR unchanged THRR unchanged      Progression   Progression Continue to progress workloads to maintain intensity without signs/symptoms of physical distress. Continue to progress workloads to maintain intensity without signs/symptoms of physical distress. Continue to progress workloads to maintain intensity without signs/symptoms of physical distress. Continue to progress workloads to maintain intensity without signs/symptoms of physical distress.    Average METs 3.3 3.9 4.1 4.4      Resistance Training   Training Prescription Yes Yes Yes Yes    Weight 3 lbs 3 lbs 5 lbs 5 lbs    Reps 10-15 10-15 10-15 10-15    Time 5 Minutes 5 Minutes 5 Minutes 5 Minutes  Interval Training   Interval Training No No No No      Recumbant Elliptical   Level 1 3 4 6     RPM 65 72 70 72    Watts 96 113 110 107    Minutes 15 15 15 15     METs 3.8 4.5 4.7 4.6      Track   Laps 15 18 19 25     Minutes 15 15 15 15     METs 2.91 3.3 3.43 4.19      Home Exercise Plan   Plans to continue exercise at -- Home (comment)  Swimming at pool at son's residence. Home (comment)  Swimming at pool at son's residence. Home (comment)  Swimming at pool at son's residence.    Frequency -- Add 4 additional days to program exercise sessions. Add 4 additional days to program exercise sessions. Add 4 additional days to program exercise sessions.    Initial Home Exercises Provided -- 08/15/23 08/15/23 08/15/23       Exercise Comments:   Exercise Comments     Row Name 08/01/23 1135 08/15/23 1050 08/22/23 1115 09/09/23 1037 09/21/23 1052   Exercise Comments Lane tolerated low intensity exercise fairly well. Rest breaks taken on the walking track due to elevated heart rate. Will continue to follow. Oriented him to the exercise equipment and stretching routine. Reviewed home exercise guidelines, goals, and target heart rate  range increase with Deward. Reviewed METs with Deward. Increased hand weights today from 3 to 5 lbs. Reviewed METs and goals with Deward. Reviewed METs with Deward.      Exercise Goals and Review:   Exercise Goals     Row Name 07/25/23 1130             Exercise Goals   Increase Physical Activity Yes       Intervention Provide advice, education, support and counseling about physical activity/exercise needs.;Develop an individualized exercise prescription for aerobic and resistive training based on initial evaluation findings, risk stratification, comorbidities and participant's personal goals.       Expected Outcomes Short Term: Attend rehab on a regular basis to increase amount of physical activity.;Long Term: Exercising regularly at least 3-5 days a week.;Long Term: Add in home exercise to make exercise part of routine and to increase amount of physical activity.       Increase Strength and Stamina Yes       Intervention Provide advice, education, support and counseling about physical activity/exercise needs.;Develop an individualized exercise prescription for aerobic and resistive training based on initial evaluation findings, risk stratification, comorbidities and participant's personal goals.       Expected Outcomes Short Term: Increase workloads from initial exercise prescription for resistance, speed, and METs.;Short Term: Perform resistance training exercises routinely during rehab and add in resistance training at home;Long Term: Improve cardiorespiratory fitness, muscular endurance and strength as measured by increased METs and functional capacity ( )       Able to understand and use rate of perceived exertion (RPE) scale Yes       Intervention Provide education and explanation on how to use RPE scale       Expected Outcomes Short Term: Able to use RPE daily in rehab to express subjective intensity level;Long Term:  Able to use RPE to guide intensity level when exercising independently        Knowledge and understanding of Target Heart Rate Range (THRR) Yes       Intervention Provide education and explanation of THRR including  how the numbers were predicted and where they are located for reference       Expected Outcomes Short Term: Able to state/look up THRR;Short Term: Able to use daily as guideline for intensity in rehab;Long Term: Able to use THRR to govern intensity when exercising independently       Understanding of Exercise Prescription Yes       Intervention Provide education, explanation, and written materials on patient's individual exercise prescription       Expected Outcomes Short Term: Able to explain program exercise prescription;Long Term: Able to explain home exercise prescription to exercise independently          Exercise Goals Re-Evaluation :  Exercise Goals Re-Evaluation     Row Name 08/01/23 1135 08/15/23 1050 09/09/23 1037         Exercise Goal Re-Evaluation   Exercise Goals Review Increase Physical Activity;Increase Strength and Stamina;Able to understand and use rate of perceived exertion (RPE) scale Increase Physical Activity;Increase Strength and Stamina;Able to understand and use rate of perceived exertion (RPE) scale;Knowledge and understanding of Target Heart Rate Range (THRR);Able to check pulse independently;Understanding of Exercise Prescription Increase Physical Activity;Increase Strength and Stamina;Able to understand and use rate of perceived exertion (RPE) scale;Knowledge and understanding of Target Heart Rate Range (THRR);Able to check pulse independently;Understanding of Exercise Prescription     Comments Abdulloh was able to understand and use RPE scale appropriately. Received clearance from Dr. Wendel to increase THRR to 40-90% age predicted max, 56-127 bpm. Reviewed exercise prescription and THRR increase with Deward. He is swimming 40-45 minutes 4 days/week. He has a smart watch to monitor his pulse. [facility]  [facility] once the Fall comes and the pool is closed. Koleson continues to progress well with exercise. He states he feels great. He feels less short of breath except going up inclines/ stairs. He is swimming 20 laps (50 yards) in 40-45 minutes.     Expected Outcomes Progress workloads as tolerated to help improve cardiorespiratory fitness. Reshard will continue daily exercise routine to help achieve personal health and fitness goals. Marshell will continue daily exercise routine to help achieve personal health and fitness goals.        Discharge Exercise Prescription (Final Exercise Prescription Changes):  Exercise Prescription Changes - 09/16/23 1029       Response to Exercise   Blood Pressure (Admit) 122/68    Blood Pressure (Exit) 102/60    Heart Rate (Admit) 53 bpm    Heart Rate (Exercise) 114 bpm    Heart Rate (Exit) 65 bpm    Rating of Perceived Exertion (Exercise) 12    Symptoms None    Comments Increased WL on recumbent elliptical today.    Duration Continue with 30 min of aerobic exercise without signs/symptoms of physical distress.    Intensity THRR unchanged      Progression   Progression Continue to progress workloads to maintain intensity without signs/symptoms of physical distress.    Average METs 4.4      Resistance Training   Training Prescription Yes    Weight 5 lbs    Reps 10-15    Time 5 Minutes      Interval Training   Interval Training No      Recumbant Elliptical   Level 6    RPM 72    Watts 107    Minutes 15    METs 4.6      Track   Laps 25  Minutes 15    METs 4.19      Home Exercise Plan   Plans to continue exercise at Home (comment)   Swimming at pool at son's residence.   Frequency Add 4 additional days to program exercise sessions.    Initial Home Exercises Provided 08/15/23          Nutrition:  Target Goals: Understanding of nutrition guidelines, daily intake of sodium 1500mg , cholesterol 200mg , calories 30% from fat and 7% or less from  saturated fats, daily to have 5 or more servings of fruits and vegetables.  Biometrics:  Pre Biometrics - 07/25/23 1124       Pre Biometrics   Waist Circumference 45 inches    Hip Circumference 41 inches    Waist to Hip Ratio 1.1 %    Triceps Skinfold 22 mm    % Body Fat 33.1 %    Grip Strength 28 kg    Flexibility --   not done- low back pain   Single Leg Stand 5.25 seconds           Nutrition Therapy Plan and Nutrition Goals:  Nutrition Therapy & Goals - 09/26/23 1021       Nutrition Therapy   Diet Heart Healthy Diet    Drug/Food Interactions Statins/Certain Fruits      Personal Nutrition Goals   Nutrition Goal Patient to identify strategies for reducing cardiovascular risk by attending the [facility] education and nutrition series weekly.   goal in action.   Personal Goal #2 Patient to improve diet quality by using the plate method as a guide for meal planning to include lean protein/plant protein, fruits, vegetables, whole grains, nonfat dairy as part of a well-balanced diet.   goal in action.   Personal Goal #3 Patient to identify strategies for weight loss with goal of 0.5-2.0# per week.   goal in action.   Comments Goals in action. Patient has medical history of CABGx4, CAD, hyperlipidemia (statin,zetia ), aortic atherosclerosis, CKD2. He continues to attend the [facility] education and nutrition series regularly.  LDL is at goal. A1c is in a prediabetic range. Patient is motivated to lose weight; have discussed strategies for weight loss including calorie density, the plate method as a guide for meal planning, label reading,etc. He is down 2.2# since starting with our program. Patient will benefit from participation in intensive cardiac rehab and adherence to nutrition, exercise, and lifestyle modification.      Intervention Plan   Intervention Nutrition handout(s) given to patient.;Prescribe, educate and counsel regarding individualized specific dietary modifications aiming  towards targeted core components such as weight, hypertension, lipid management, diabetes, heart failure and other comorbidities.    Expected Outcomes Short Term Goal: Understand basic principles of dietary content, such as calories, fat, sodium, cholesterol and nutrients.;Long Term Goal: Adherence to prescribed nutrition plan.          Nutrition Assessments:  Nutrition Assessments - 08/01/23 1131       Rate Your Plate Scores   Pre Score 50         MEDIFICTS Score Key: >=70 Need to make dietary changes  40-70 Heart Healthy Diet <= 40 Therapeutic Level Cholesterol Diet   Flowsheet Row INTENSIVE CARDIAC REHAB from 08/01/2023 in [facility], V[facility] Lung [facility]  Picture Your Plate Total Score on Admission 50   Picture Your Plate Scores: <59 Unhealthy dietary pattern with much room for improvement. 41-50 Dietary pattern unlikely to meet recommendations for good health and  room for improvement. 51-60 More healthful dietary pattern, with some room for improvement.  >60 Healthy dietary pattern, although there may be some specific behaviors that could be improved.    Nutrition Goals Re-Evaluation:  Nutrition Goals Re-Evaluation     Row Name 08/01/23 1126 08/29/23 1155 09/26/23 1021         Goals   Current Weight 200 lb 6.4 oz (90.9 kg) 199 lb 1.2 oz (90.3 kg) 198 lb 3.1 oz (89.9 kg)     Comment LDL 33, HDL 61, A1c 6.0 LDL 33, HDL 61, A1c 6.0 no new labs; most recent labs  LDL 33, HDL 61, A1c 6.0     Expected Outcome Patient has medical history of CABGx4, CAD, hyperlipidemia (statin,zetia ), aortic atherosclerosis, CKD2. LDL is at goal. A1c is in a prediabetic range. Patient is motivated to lose weight; will continue to discuss strategies for weight loss including calorie density, the plate method as a guide for meal planning, label reading,etc. Patient will benefit from participation in intensive cardiac rehab for nutrition, exercise, and  lifestyle modification Goals in progress. Patient has medical history of CABGx4, CAD, hyperlipidemia (statin,zetia ), aortic atherosclerosis, CKD2. He continues to attend the [facility] education and nutrition series regularly. LDL is at goal. A1c is in a prediabetic range. Patient is motivated to lose weight; will continue to discuss strategies for weight loss including calorie density, the plate method as a guide for meal planning, label reading,etc. He is down 1.3# since starting with our program. Patient will benefit from participation in intensive cardiac rehab for nutrition, exercise, and lifestyle modification Goals in action. Patient has medical history of CABGx4, CAD, hyperlipidemia (statin,zetia ), aortic atherosclerosis, CKD2. He continues to attend the [facility] education and nutrition series regularly. LDL is at goal. A1c is in a prediabetic range. Patient is motivated to lose weight; have discussed strategies for weight loss including calorie density, the plate method as a guide for meal planning, label reading,etc. He is down 2.2# since starting with our program. Patient will benefit from participation in intensive cardiac rehab and adherence to nutrition, exercise, and lifestyle modification.        Nutrition Goals Re-Evaluation:  Nutrition Goals Re-Evaluation     Row Name 08/01/23 1126 08/29/23 1155 09/26/23 1021         Goals   Current Weight 200 lb 6.4 oz (90.9 kg) 199 lb 1.2 oz (90.3 kg) 198 lb 3.1 oz (89.9 kg)     Comment LDL 33, HDL 61, A1c 6.0 LDL 33, HDL 61, A1c 6.0 no new labs; most recent labs  LDL 33, HDL 61, A1c 6.0     Expected Outcome Patient has medical history of CABGx4, CAD, hyperlipidemia (statin,zetia ), aortic atherosclerosis, CKD2. LDL is at goal. A1c is in a prediabetic range. Patient is motivated to lose weight; will continue to discuss strategies for weight loss including calorie density, the plate method as a guide for meal planning, label reading,etc. Patient will  benefit from participation in intensive cardiac rehab for nutrition, exercise, and lifestyle modification Goals in progress. Patient has medical history of CABGx4, CAD, hyperlipidemia (statin,zetia ), aortic atherosclerosis, CKD2. He continues to attend the [facility] education and nutrition series regularly. LDL is at goal. A1c is in a prediabetic range. Patient is motivated to lose weight; will continue to discuss strategies for weight loss including calorie density, the plate method as a guide for meal planning, label reading,etc. He is down 1.3# since starting with our program. Patient will benefit from participation in intensive cardiac  rehab for nutrition, exercise, and lifestyle modification Goals in action. Patient has medical history of CABGx4, CAD, hyperlipidemia (statin,zetia ), aortic atherosclerosis, CKD2. He continues to attend the [facility] education and nutrition series regularly. LDL is at goal. A1c is in a prediabetic range. Patient is motivated to lose weight; have discussed strategies for weight loss including calorie density, the plate method as a guide for meal planning, label reading,etc. He is down 2.2# since starting with our program. Patient will benefit from participation in intensive cardiac rehab and adherence to nutrition, exercise, and lifestyle modification.        Nutrition Goals Discharge (Final Nutrition Goals Re-Evaluation):  Nutrition Goals Re-Evaluation - 09/26/23 1021       Goals   Current Weight 198 lb 3.1 oz (89.9 kg)    Comment no new labs; most recent labs  LDL 33, HDL 61, A1c 6.0    Expected Outcome Goals in action. Patient has medical history of CABGx4, CAD, hyperlipidemia (statin,zetia ), aortic atherosclerosis, CKD2. He continues to attend the [facility] education and nutrition series regularly. LDL is at goal. A1c is in a prediabetic range. Patient is motivated to lose weight; have discussed strategies for weight loss including calorie density, the plate method as a  guide for meal planning, label reading,etc. He is down 2.2# since starting with our program. Patient will benefit from participation in intensive cardiac rehab and adherence to nutrition, exercise, and lifestyle modification.          Psychosocial: Target Goals: Acknowledge presence or absence of significant depression and/or stress, maximize coping skills, provide positive support system. Participant is able to verbalize types and ability to use techniques and skills needed for reducing stress and depression.  Initial Review & Psychosocial Screening:  Initial Psych Review & Screening - 07/25/23 1130       Initial Review   Current issues with History of Depression      Family Dynamics   Good Support System? Yes   wife   Comments Cassius has a history of depression but is in remission and feels Zoloft  is helping. No current concerns or need for additional resources.      Barriers   Psychosocial barriers to participate in program There are no identifiable barriers or psychosocial needs.      Screening Interventions   Interventions Encouraged to exercise;Provide feedback about the scores to participant    Expected Outcomes Long Term goal: The participant improves quality of Life and PHQ9 Scores as seen by post scores and/or verbalization of changes;Short Term goal: Identification and review with participant of any Quality of Life or Depression concerns found by scoring the questionnaire.          Quality of Life Scores:  Quality of Life - 07/25/23 1330       Quality of Life   Select Quality of Life      Quality of Life Scores   Health/Function Pre 25.33 %    Socioeconomic Pre 26.43 %    Psych/Spiritual Pre 24.67 %    Family Pre 21.6 %    GLOBAL Pre 24.88 %         Scores of 19 and below usually indicate a poorer quality of life in these areas.  A difference of  2-3 points is a clinically meaningful difference.  A difference of 2-3 points in the total score of the Quality of  Life Index has been associated with significant improvement in overall quality of life, self-image, physical symptoms, and general health in studies  assessing change in quality of life.  PHQ-9: Review Flowsheet  More data exists      07/25/2023 07/04/2023 07/01/2023 06/24/2023 06/21/2023  Depression screen PHQ 2/9  Decreased Interest 0 0 0 0 0  Down, Depressed, Hopeless 0 0 0 0 0  PHQ - 2 Score 0 0 0 0 0  Altered sleeping 0 0 - - -  Tired, decreased energy 1 2 - - -  Change in appetite 0 0 - - -  Feeling bad or failure about yourself  0 0 - - -  Trouble concentrating 0 0 - - -  Moving slowly or fidgety/restless 0 0 - - -  Suicidal thoughts 0 0 - - -  PHQ-9 Score 1 2 - - -  Difficult doing work/chores Somewhat difficult Not difficult at all - - -   Interpretation of Total Score  Total Score Depression Severity:  1-4 = Minimal depression, 5-9 = Mild depression, 10-14 = Moderate depression, 15-19 = Moderately severe depression, 20-27 = Severe depression   Psychosocial Evaluation and Intervention:   Psychosocial Re-Evaluation:  Psychosocial Re-Evaluation     Row Name 08/01/23 1817 08/23/23 1643 09/27/23 1559         Psychosocial Re-Evaluation   Current issues with History of Depression History of Depression History of Depression     Comments Eissa did not voice any increased concerns or stressors on his first day of exercise. Yutaka mentioned his energy is improving.  Pt has no other complaints during exercise at cardiac rehab at this time. Daniela has not voiced any increased concerns or stressos during exercise at cardiac rehab     Expected Outcomes Dakai will have controlled or decreased depression uponcompletion of cardiac rehab. Jusitn will have controlled or decreased depression uponcompletion of cardiac rehab. Emiel will have controlled or decreased depression uponcompletion of cardiac rehab.     Interventions Stress management education;Encouraged to attend Cardiac Rehabilitation for the  exercise;Relaxation education Stress management education;Encouraged to attend Cardiac Rehabilitation for the exercise;Relaxation education Stress management education;Encouraged to attend Cardiac Rehabilitation for the exercise;Relaxation education     Continue Psychosocial Services  Follow up required by staff No Follow up required No Follow up required        Psychosocial Discharge (Final Psychosocial Re-Evaluation):  Psychosocial Re-Evaluation - 09/27/23 1559       Psychosocial Re-Evaluation   Current issues with History of Depression    Comments Yer has not voiced any increased concerns or stressos during exercise at cardiac rehab    Expected Outcomes Noa will have controlled or decreased depression uponcompletion of cardiac rehab.    Interventions Stress management education;Encouraged to attend Cardiac Rehabilitation for the exercise;Relaxation education    Continue Psychosocial Services  No Follow up required          Vocational Rehabilitation: Provide vocational rehab assistance to qualifying candidates.   Vocational Rehab Evaluation & Intervention:  Vocational Rehab - 07/25/23 1131       Initial Vocational Rehab Evaluation & Intervention   Assessment shows need for Vocational Rehabilitation No   retired         Education: Education Goals: Education classes will be provided on a weekly basis, covering required topics. Participant will state understanding/return demonstration of topics presented.    Education     Row Name 08/01/23 1300     Education   Cardiac Education Topics Pritikin   Geographical information systems officer Psychosocial  Psychosocial Workshop Focused Goals, Sustainable Changes   Instruction Review Code 1- Verbalizes Understanding   Class Start Time 1155   Class Stop Time 1235   Class Time Calculation (min) 40 min    Row Name 08/03/23 1300     Education   Cardiac Education Topics Pritikin   Research scientist (life sciences)   Weekly Topic Comforting Weekend Breakfasts   Instruction Review Code 1- Verbalizes Understanding   Class Start Time 1145   Class Stop Time 1220   Class Time Calculation (min) 35 min    Row Name 08/10/23 1300     Education   Cardiac Education Topics Pritikin   Customer service manager   Weekly Topic Fast Evening Meals   Instruction Review Code 1- Verbalizes Understanding   Class Start Time 1145   Class Stop Time 1225   Class Time Calculation (min) 40 min    Row Name 08/12/23 1100     Education   Cardiac Education Topics Pritikin   Licensed conveyancer Nutrition   Nutrition Vitamins and Minerals   Instruction Review Code 1- Verbalizes Understanding   Class Start Time 1145   Class Stop Time 1225   Class Time Calculation (min) 40 min    Row Name 08/15/23 1600     Education   Cardiac Education Topics Pritikin   Glass blower/designer Nutrition   Nutrition Workshop Fueling a Forensic psychologist   Instruction Review Code 1- Tax inspector   Class Start Time 1145   Class Stop Time 1230   Class Time Calculation (min) 45 min    Row Name 08/17/23 1500     Education   Cardiac Education Topics Pritikin   Customer service manager   Weekly Topic International Cuisine- Spotlight on the [employer] Zones   Instruction Review Code 1- Verbalizes Understanding   Class Start Time 1145   Class Stop Time 1225   Class Time Calculation (min) 40 min    Row Name 08/22/23 1100     Education   Cardiac Education Topics --   Select --     Workshops   Educator --   Physiological scientist --   Psychosocial Workshop --   Instruction Review Code --    Row Name 08/24/23 1100     Education   Cardiac Education Topics Pritikin   Optometrist   Weekly Topic Simple Sides and Sauces   Instruction Review Code 1- Verbalizes Understanding   Class Start Time 1145   Class Stop Time 1225   Class Time Calculation (min) 40 min    Row Name 08/29/23 1200     Education   Cardiac Education Topics Pritikin   Hospital doctor Education   General Education Hypertension and Heart Disease   Instruction Review Code 1- Verbalizes Understanding   Class Start Time 1150   Class Stop Time 1225   Class Time Calculation (min) 35 min    Row Name 08/31/23 1100     Education   Cardiac Education Topics Pritikin   [employer]  Secondary school teacher   Weekly Topic Powerhouse Plant-Based Proteins   Instruction Review Code 1- Verbalizes Understanding   Class Start Time 1145   Class Stop Time 1225   Class Time Calculation (min) 40 min    Row Name 09/07/23 1100     Education   Cardiac Education Topics Pritikin   Customer service manager   Weekly Topic Tasty Appetizers and Snacks   Instruction Review Code 1- Verbalizes Understanding   Class Start Time 1145   Class Stop Time 1225   Class Time Calculation (min) 40 min    Row Name 09/09/23 1100     Education   Cardiac Education Topics Pritikin   Hospital doctor Education   General Education Heart Disease Risk Reduction   Instruction Review Code 1- Verbalizes Understanding   Class Start Time 1150   Class Stop Time 1240   Class Time Calculation (min) 50 min    Row Name 09/14/23 1600     Education   Cardiac Education Topics Pritikin   Customer service manager   Weekly Topic Adding Flavor - Sodium-Free   Instruction Review Code 1- Verbalizes Understanding   Class Start Time 1140   Class Stop Time 1215   Class Time Calculation  (min) 35 min    Row Name 09/16/23 1100     Education   Cardiac Education Topics Pritikin   Geographical information systems officer Exercise   Exercise Workshop Location manager and Fall Prevention   Instruction Review Code 1- Verbalizes Understanding   Class Start Time 1150   Class Stop Time 1240   Class Time Calculation (min) 50 min    Row Name 09/21/23 1000     Education   Cardiac Education Topics Pritikin   Customer service manager   Weekly Topic Fast and Healthy Breakfasts   Instruction Review Code 1- Verbalizes Understanding   Class Start Time 1145   Class Stop Time 1220   Class Time Calculation (min) 35 min    Row Name 09/26/23 1100     Education   Cardiac Education Topics Pritikin   Hospital doctor Education   General Education Metabolic Syndrome and Belly Fat   Instruction Review Code 1- Verbalizes Understanding   Class Start Time 1200   Class Stop Time 1235   Class Time Calculation (min) 35 min      Core Videos: Exercise    Move It!  Clinical staff conducted group or individual video education with verbal and written material and guidebook.  Patient learns the recommended Pritikin exercise program. Exercise with the goal of living a long, healthy life. Some of the health benefits of exercise include controlled diabetes, healthier blood pressure levels, improved cholesterol levels, improved heart and lung capacity, improved sleep, and better body composition. Everyone should speak with their doctor before starting or changing an exercise routine.  Biomechanical Limitations Clinical staff conducted group or individual video education with verbal and written material and guidebook.  Patient learns how biomechanical limitations can impact exercise and how we can mitigate and possibly overcome limitations to have an  impactful and balanced exercise routine.  Body Composition Clinical staff conducted group or individual video education with verbal and written material and guidebook.  Patient learns that body composition (ratio of muscle mass to fat mass) is a key component to assessing overall fitness, rather than body weight alone. Increased fat mass, especially visceral belly fat, can put us  at increased risk for metabolic syndrome, type 2 diabetes, heart disease, and even death. It is recommended to combine diet and exercise (cardiovascular and resistance training) to improve your body composition. Seek guidance from your physician and exercise physiologist before implementing an exercise routine.  Exercise Action Plan Clinical staff conducted group or individual video education with verbal and written material and guidebook.  Patient learns the recommended strategies to achieve and enjoy long-term exercise adherence, including variety, self-motivation, self-efficacy, and positive decision making. Benefits of exercise include fitness, good health, weight management, more energy, better sleep, less stress, and overall well-being.  Medical   Heart Disease Risk Reduction Clinical staff conducted group or individual video education with verbal and written material and guidebook.  Patient learns our heart is our most vital organ as it circulates oxygen, nutrients, white blood cells, and hormones throughout the entire body, and carries waste away. Data supports a plant-based eating plan like the Pritikin Program for its effectiveness in slowing progression of and reversing heart disease. The video provides a number of recommendations to address heart disease.   Metabolic Syndrome and Belly Fat  Clinical staff conducted group or individual video education with verbal and written material and guidebook.  Patient learns what metabolic syndrome is, how it leads to heart disease, and how one can reverse it and keep it  from coming back. You have metabolic syndrome if you have 3 of the following 5 criteria: abdominal obesity, high blood pressure, high triglycerides, low HDL cholesterol, and high blood sugar.  Hypertension and Heart Disease Clinical staff conducted group or individual video education with verbal and written material and guidebook.  Patient learns that high blood pressure, or hypertension, is very common in the [place] . Hypertension is largely due to excessive salt intake, but other important risk factors include being overweight, physical inactivity, drinking too much alcohol, smoking, and not eating enough potassium from fruits and vegetables. High blood pressure is a leading risk factor for heart attack, stroke, congestive heart failure, dementia, kidney failure, and premature death. Long-term effects of excessive salt intake include stiffening of the arteries and thickening of heart muscle and organ damage. Recommendations include ways to reduce hypertension and the risk of heart disease.  Diseases of Our Time - Focusing on Diabetes Clinical staff conducted group or individual video education with verbal and written material and guidebook.  Patient learns why the best way to stop diseases of our time is prevention, through food and other lifestyle changes. Medicine (such as prescription pills and surgeries) is often only a Band-Aid on the problem, not a long-term solution. Most common diseases of our time include obesity, type 2 diabetes, hypertension, heart disease, and cancer. The Pritikin Program is recommended and has been proven to help reduce, reverse, and/or prevent the damaging effects of metabolic syndrome.  Nutrition   Overview of the Pritikin Eating Plan  Clinical staff conducted group or individual video education with verbal and written material and guidebook.  Patient learns about the Pritikin Eating Plan for disease risk reduction. The Pritikin Eating Plan emphasizes a wide  variety of unrefined, minimally-processed carbohydrates, like fruits, vegetables, whole grains, and legumes. Go,  Caution, and Stop food choices are explained. Plant-based and lean animal proteins are emphasized. Rationale provided for low sodium intake for blood pressure control, low added sugars for blood sugar stabilization, and low added fats and oils for coronary artery disease risk reduction and weight management.  Calorie Density  Clinical staff conducted group or individual video education with verbal and written material and guidebook.  Patient learns about calorie density and how it impacts the Pritikin Eating Plan. Knowing the characteristics of the food you choose will help you decide whether those foods will lead to weight gain or weight loss, and whether you want to consume more or less of them. Weight loss is usually a side effect of the Pritikin Eating Plan because of its focus on low calorie-dense foods.  Label Reading  Clinical staff conducted group or individual video education with verbal and written material and guidebook.  Patient learns about the Pritikin recommended label reading guidelines and corresponding recommendations regarding calorie density, added sugars, sodium content, and whole grains.  Dining Out - Part 1  Clinical staff conducted group or individual video education with verbal and written material and guidebook.  Patient learns that restaurant meals can be sabotaging because they can be so high in calories, fat, sodium, and/or sugar. Patient learns recommended strategies on how to positively address this and avoid unhealthy pitfalls.  Facts on Fats  Clinical staff conducted group or individual video education with verbal and written material and guidebook.  Patient learns that lifestyle modifications can be just as effective, if not more so, as many medications for lowering your risk of heart disease. A Pritikin lifestyle can help to reduce your risk of  inflammation and atherosclerosis (cholesterol build-up, or plaque, in the artery walls). Lifestyle interventions such as dietary choices and physical activity address the cause of atherosclerosis. A review of the types of fats and their impact on blood cholesterol levels, along with dietary recommendations to reduce fat intake is also included.  Nutrition Action Plan  Clinical staff conducted group or individual video education with verbal and written material and guidebook.  Patient learns how to incorporate Pritikin recommendations into their lifestyle. Recommendations include planning and keeping personal health goals in mind as an important part of their success.  Healthy Mind-Set    Healthy Minds, Bodies, Hearts  Clinical staff conducted group or individual video education with verbal and written material and guidebook.  Patient learns how to identify when they are stressed. Video will discuss the impact of that stress, as well as the many benefits of stress management. Patient will also be introduced to stress management techniques. The way we think, act, and feel has an impact on our hearts.  How Our Thoughts Can Heal Our Hearts  Clinical staff conducted group or individual video education with verbal and written material and guidebook.  Patient learns that negative thoughts can cause depression and anxiety. This can result in negative lifestyle behavior and serious health problems. Cognitive behavioral therapy is an effective method to help control our thoughts in order to change and improve our emotional outlook.  Additional Videos:  Exercise    Improving Performance  Clinical staff conducted group or individual video education with verbal and written material and guidebook.  Patient learns to use a non-linear approach by alternating intensity levels and lengths of time spent exercising to help burn more calories and lose more body fat. Cardiovascular exercise helps improve heart health,  metabolism, hormonal balance, blood sugar control, and recovery from fatigue. Resistance  training improves strength, endurance, balance, coordination, reaction time, metabolism, and muscle mass. Flexibility exercise improves circulation, posture, and balance. Seek guidance from your physician and exercise physiologist before implementing an exercise routine and learn your capabilities and proper form for all exercise.  Introduction to Yoga  Clinical staff conducted group or individual video education with verbal and written material and guidebook.  Patient learns about yoga, a discipline of the coming together of mind, breath, and body. The benefits of yoga include improved flexibility, improved range of motion, better posture and core strength, increased lung function, weight loss, and positive self-image. Yoga's heart health benefits include lowered blood pressure, healthier heart rate, decreased cholesterol and triglyceride levels, improved immune function, and reduced stress. Seek guidance from your physician and exercise physiologist before implementing an exercise routine and learn your capabilities and proper form for all exercise.  Medical   Aging: Enhancing Your Quality of Life  Clinical staff conducted group or individual video education with verbal and written material and guidebook.  Patient learns key strategies and recommendations to stay in good physical health and enhance quality of life, such as prevention strategies, having an advocate, securing a Health Care Proxy and Power of Attorney, and keeping a list of medications and system for tracking them. It also discusses how to avoid risk for bone loss.  Biology of Weight Control  Clinical staff conducted group or individual video education with verbal and written material and guidebook.  Patient learns that weight gain occurs because we consume more calories than we burn (eating more, moving less). Even if your body weight is normal, you  may have higher ratios of fat compared to muscle mass. Too much body fat puts you at increased risk for cardiovascular disease, heart attack, stroke, type 2 diabetes, and obesity-related cancers. In addition to exercise, following the Pritikin Eating Plan can help reduce your risk.  Decoding Lab Results  Clinical staff conducted group or individual video education with verbal and written material and guidebook.  Patient learns that lab test reflects one measurement whose values change over time and are influenced by many factors, including medication, stress, sleep, exercise, food, hydration, pre-existing medical conditions, and more. It is recommended to use the knowledge from this video to become more involved with your lab results and evaluate your numbers to speak with your doctor.   Diseases of Our Time - Overview  Clinical staff conducted group or individual video education with verbal and written material and guidebook.  Patient learns that according to the CDC, 50% to 70% of chronic diseases (such as obesity, type 2 diabetes, elevated lipids, hypertension, and heart disease) are avoidable through lifestyle improvements including healthier food choices, listening to satiety cues, and increased physical activity.  Sleep Disorders Clinical staff conducted group or individual video education with verbal and written material and guidebook.  Patient learns how good quality and duration of sleep are important to overall health and well-being. Patient also learns about sleep disorders and how they impact health along with recommendations to address them, including discussing with a physician.  Nutrition  Dining Out - Part 2 Clinical staff conducted group or individual video education with verbal and written material and guidebook.  Patient learns how to plan ahead and communicate in order to maximize their dining experience in a healthy and nutritious manner. Included are recommended food choices  based on the type of restaurant the patient is visiting.   Fueling a Banker conducted group or individual video  education with verbal and written material and guidebook.  There is a strong connection between our food choices and our health. Diseases like obesity and type 2 diabetes are very prevalent and are in large-part due to lifestyle choices. The Pritikin Eating Plan provides plenty of food and hunger-curbing satisfaction. It is easy to follow, affordable, and helps reduce health risks.  Menu Workshop  Clinical staff conducted group or individual video education with verbal and written material and guidebook.  Patient learns that restaurant meals can sabotage health goals because they are often packed with calories, fat, sodium, and sugar. Recommendations include strategies to plan ahead and to communicate with the manager, chef, or server to help order a healthier meal.  Planning Your Eating Strategy  Clinical staff conducted group or individual video education with verbal and written material and guidebook.  Patient learns about the Pritikin Eating Plan and its benefit of reducing the risk of disease. The Pritikin Eating Plan does not focus on calories. Instead, it emphasizes high-quality, nutrient-rich foods. By knowing the characteristics of the foods, we choose, we can determine their calorie density and make informed decisions.  Targeting Your Nutrition Priorities  Clinical staff conducted group or individual video education with verbal and written material and guidebook.  Patient learns that lifestyle habits have a tremendous impact on disease risk and progression. This video provides eating and physical activity recommendations based on your personal health goals, such as reducing LDL cholesterol, losing weight, preventing or controlling type 2 diabetes, and reducing high blood pressure.  Vitamins and Minerals  Clinical staff conducted group or individual video  education with verbal and written material and guidebook.  Patient learns different ways to obtain key vitamins and minerals, including through a recommended healthy diet. It is important to discuss all supplements you take with your doctor.   Healthy Mind-Set    Smoking Cessation  Clinical staff conducted group or individual video education with verbal and written material and guidebook.  Patient learns that cigarette smoking and tobacco addiction pose a serious health risk which affects millions of people. Stopping smoking will significantly reduce the risk of heart disease, lung disease, and many forms of cancer. Recommended strategies for quitting are covered, including working with your doctor to develop a successful plan.  Culinary   Becoming a Set designer conducted group or individual video education with verbal and written material and guidebook.  Patient learns that cooking at home can be healthy, cost-effective, quick, and puts them in control. Keys to cooking healthy recipes will include looking at your recipe, assessing your equipment needs, planning ahead, making it simple, choosing cost-effective seasonal ingredients, and limiting the use of added fats, salts, and sugars.  Cooking - Breakfast and Snacks  Clinical staff conducted group or individual video education with verbal and written material and guidebook.  Patient learns how important breakfast is to satiety and nutrition through the entire day. Recommendations include key foods to eat during breakfast to help stabilize blood sugar levels and to prevent overeating at meals later in the day. Planning ahead is also a key component.  Cooking - Educational psychologist conducted group or individual video education with verbal and written material and guidebook.  Patient learns eating strategies to improve overall health, including an approach to cook more at home. Recommendations include thinking of  animal protein as a side on your plate rather than center stage and focusing instead on lower calorie dense options like vegetables, fruits, whole  grains, and plant-based proteins, such as beans. Making sauces in large quantities to freeze for later and leaving the skin on your vegetables are also recommended to maximize your experience.  Cooking - Healthy Salads and Dressing Clinical staff conducted group or individual video education with verbal and written material and guidebook.  Patient learns that vegetables, fruits, whole grains, and legumes are the foundations of the Pritikin Eating Plan. Recommendations include how to incorporate each of these in flavorful and healthy salads, and how to create homemade salad dressings. Proper handling of ingredients is also covered. Cooking - Soups and [employer] - Soups and Desserts Clinical staff conducted group or individual video education with verbal and written material and guidebook.  Patient learns that Pritikin soups and desserts make for easy, nutritious, and delicious snacks and meal components that are low in sodium, fat, sugar, and calorie density, while high in vitamins, minerals, and filling fiber. Recommendations include simple and healthy ideas for soups and desserts.   Overview     The Pritikin Solution Program Overview Clinical staff conducted group or individual video education with verbal and written material and guidebook.  Patient learns that the results of the Pritikin Program have been documented in more than 100 articles published in peer-reviewed journals, and the benefits include reducing risk factors for (and, in some cases, even reversing) high cholesterol, high blood pressure, type 2 diabetes, obesity, and more! An overview of the three key pillars of the Pritikin Program will be covered: eating well, doing regular exercise, and having a healthy mind-set.  WORKSHOPS  Exercise: Exercise Basics: Building Your Action  Plan Clinical staff led group instruction and group discussion with PowerPoint presentation and patient guidebook. To enhance the learning environment the use of posters, models and videos may be added. At the conclusion of this workshop, patients will comprehend the difference between physical activity and exercise, as well as the benefits of incorporating both, into their routine. Patients will understand the FITT (Frequency, Intensity, Time, and Type) principle and how to use it to build an exercise action plan. In addition, safety concerns and other considerations for exercise and cardiac rehab will be addressed by the presenter. The purpose of this lesson is to promote a comprehensive and effective weekly exercise routine in order to improve patients' overall level of fitness.   Managing Heart Disease: Your Path to a Healthier Heart Clinical staff led group instruction and group discussion with PowerPoint presentation and patient guidebook. To enhance the learning environment the use of posters, models and videos may be added.At the conclusion of this workshop, patients will understand the anatomy and physiology of the heart. Additionally, they will understand how Pritikin's three pillars impact the risk factors, the progression, and the management of heart disease.  The purpose of this lesson is to provide a high-level overview of the heart, heart disease, and how the Pritikin lifestyle positively impacts risk factors.  Exercise Biomechanics Clinical staff led group instruction and group discussion with PowerPoint presentation and patient guidebook. To enhance the learning environment the use of posters, models and videos may be added. Patients will learn how the structural parts of their bodies function and how these functions impact their daily activities, movement, and exercise. Patients will learn how to promote a neutral spine, learn how to manage pain, and identify ways to improve  their physical movement in order to promote healthy living. The purpose of this lesson is to expose patients to common physical limitations that impact physical activity. Participants  will learn practical ways to adapt and manage aches and pains, and to minimize their effect on regular exercise. Patients will learn how to maintain good posture while sitting, walking, and lifting.  Balance Training and Fall Prevention  Clinical staff led group instruction and group discussion with PowerPoint presentation and patient guidebook. To enhance the learning environment the use of posters, models and videos may be added. At the conclusion of this workshop, patients will understand the importance of their sensorimotor skills (vision, proprioception, and the vestibular system) in maintaining their ability to balance as they age. Patients will apply a variety of balancing exercises that are appropriate for their current level of function. Patients will understand the common causes for poor balance, possible solutions to these problems, and ways to modify their physical environment in order to minimize their fall risk. The purpose of this lesson is to teach patients about the importance of maintaining balance as they age and ways to minimize their risk of falling.  WORKSHOPS   Nutrition:  Fueling a Ship broker led group instruction and group discussion with PowerPoint presentation and patient guidebook. To enhance the learning environment the use of posters, models and videos may be added. Patients will review the foundational principles of the Pritikin Eating Plan and understand what constitutes a serving size in each of the food groups. Patients will also learn Pritikin-friendly foods that are better choices when away from home and review make-ahead meal and snack options. Calorie density will be reviewed and applied to three nutrition priorities: weight maintenance, weight loss, and weight  gain. The purpose of this lesson is to reinforce (in a group setting) the key concepts around what patients are recommended to eat and how to apply these guidelines when away from home by planning and selecting Pritikin-friendly options. Patients will understand how calorie density may be adjusted for different weight management goals.  Mindful Eating  Clinical staff led group instruction and group discussion with PowerPoint presentation and patient guidebook. To enhance the learning environment the use of posters, models and videos may be added. Patients will briefly review the concepts of the Pritikin Eating Plan and the importance of low-calorie dense foods. The concept of mindful eating will be introduced as well as the importance of paying attention to internal hunger signals. Triggers for non-hunger eating and techniques for dealing with triggers will be explored. The purpose of this lesson is to provide patients with the opportunity to review the basic principles of the Pritikin Eating Plan, discuss the value of eating mindfully and how to measure internal cues of hunger and fullness using the Hunger Scale. Patients will also discuss reasons for non-hunger eating and learn strategies to use for controlling emotional eating.  Targeting Your Nutrition Priorities Clinical staff led group instruction and group discussion with PowerPoint presentation and patient guidebook. To enhance the learning environment the use of posters, models and videos may be added. Patients will learn how to determine their genetic susceptibility to disease by reviewing their family history. Patients will gain insight into the importance of diet as part of an overall healthy lifestyle in mitigating the impact of genetics and other environmental insults. The purpose of this lesson is to provide patients with the opportunity to assess their personal nutrition priorities by looking at their family history, their own health history  and current risk factors. Patients will also be able to discuss ways of prioritizing and modifying the Pritikin Eating Plan for their highest risk areas  Menu  Clinical staff led group instruction and group discussion with PowerPoint presentation and patient guidebook. To enhance the learning environment the use of posters, models and videos may be added. Using menus brought in from [employer], or printed from [employer], patients will apply the Pritikin dining out guidelines that were presented in the [employer] video. Patients will also be able to practice these guidelines in a variety of provided scenarios. The purpose of this lesson is to provide patients with the opportunity to practice hands-on learning of the Pritikin Dining Out guidelines with actual menus and practice scenarios.  Label Reading Clinical staff led group instruction and group discussion with PowerPoint presentation and patient guidebook. To enhance the learning environment the use of posters, models and videos may be added. Patients will review and discuss the Pritikin label reading guidelines presented in Pritikin's Label Reading Educational series video. Using fool labels brought in from local grocery stores and markets, patients will apply the label reading guidelines and determine if the packaged food meet the Pritikin guidelines. The purpose of this lesson is to provide patients with the opportunity to review, discuss, and practice hands-on learning of the Pritikin Label Reading guidelines with actual packaged food labels. Cooking School  Pritikin's [employer] are designed to teach patients ways to prepare quick, simple, and affordable recipes at home. The importance of nutrition's role in chronic disease risk reduction is reflected in its emphasis in the overall Pritikin program. By learning how to prepare essential core Pritikin Eating Plan recipes, patients will increase control over  what they eat; be able to customize the flavor of foods without the use of added salt, sugar, or fat; and improve the quality of the food they consume. By learning a set of core recipes which are easily assembled, quickly prepared, and affordable, patients are more likely to prepare more healthy foods at home. These workshops focus on convenient breakfasts, simple entres, side dishes, and desserts which can be prepared with minimal effort and are consistent with nutrition recommendations for cardiovascular risk reduction. Cooking [employer] are taught by a Armed forces logistics/support/administrative officer (RD) who has been trained by the [employer]. The chef or RD has a clear understanding of the importance of minimizing - if not completely eliminating - added fat, sugar, and sodium in recipes. Throughout the series of Cooking School Workshop sessions, patients will learn about healthy ingredients and efficient methods of cooking to build confidence in their capability to prepare    Cooking School weekly topics:  Adding Flavor- Sodium-Free  Fast and Healthy Breakfasts  Powerhouse Plant-Based Proteins  Satisfying Salads and Dressings  Simple Sides and Sauces  International Cuisine-Spotlight on the [employer] Zones  Delicious Desserts  Savory Soups  [employer] - Meals in a Astronomer Appetizers and Snacks  Comforting Weekend Breakfasts  One-Pot Wonders   Fast Evening Meals  Landscape architect Your Pritikin Plate  WORKSHOPS   Healthy Mindset (Psychosocial):  Focused Goals, Sustainable Changes Clinical staff led group instruction and group discussion with PowerPoint presentation and patient guidebook. To enhance the learning environment the use of posters, models and videos may be added. Patients will be able to apply effective goal setting strategies to establish at least one personal goal, and then take consistent, meaningful action toward that goal. They will learn to  identify common barriers to achieving personal goals and develop strategies to overcome them. Patients will also gain an understanding of how our mind-set can  impact our ability to achieve goals and the importance of cultivating a positive and growth-oriented mind-set. The purpose of this lesson is to provide patients with a deeper understanding of how to set and achieve personal goals, as well as the tools and strategies needed to overcome common obstacles which may arise along the way.  From Head to Heart: The Power of a Healthy Outlook  Clinical staff led group instruction and group discussion with PowerPoint presentation and patient guidebook. To enhance the learning environment the use of posters, models and videos may be added. Patients will be able to recognize and describe the impact of emotions and mood on physical health. They will discover the importance of self-care and explore self-care practices which may work for them. Patients will also learn how to utilize the 4 C's to cultivate a healthier outlook and better manage stress and challenges. The purpose of this lesson is to demonstrate to patients how a healthy outlook is an essential part of maintaining good health, especially as they continue their cardiac rehab journey.  Healthy Sleep for a Healthy Heart Clinical staff led group instruction and group discussion with PowerPoint presentation and patient guidebook. To enhance the learning environment the use of posters, models and videos may be added. At the conclusion of this workshop, patients will be able to demonstrate knowledge of the importance of sleep to overall health, well-being, and quality of life. They will understand the symptoms of, and treatments for, common sleep disorders. Patients will also be able to identify daytime and nighttime behaviors which impact sleep, and they will be able to apply these tools to help manage sleep-related challenges. The purpose of this lesson is to  provide patients with a general overview of sleep and outline the importance of quality sleep. Patients will learn about a few of the most common sleep disorders. Patients will also be introduced to the concept of "sleep hygiene," and discover ways to self-manage certain sleeping problems through simple daily behavior changes. Finally, the workshop will motivate patients by clarifying the links between quality sleep and their goals of heart-healthy living.   Recognizing and Reducing Stress Clinical staff led group instruction and group discussion with PowerPoint presentation and patient guidebook. To enhance the learning environment the use of posters, models and videos may be added. At the conclusion of this workshop, patients will be able to understand the types of stress reactions, differentiate between acute and chronic stress, and recognize the impact that chronic stress has on their health. They will also be able to apply different coping mechanisms, such as reframing negative self-talk. Patients will have the opportunity to practice a variety of stress management techniques, such as deep abdominal breathing, progressive muscle relaxation, and/or guided imagery.  The purpose of this lesson is to educate patients on the role of stress in their lives and to provide healthy techniques for coping with it.  Learning Barriers/Preferences:  Learning Barriers/Preferences - 07/25/23 1131       Learning Barriers/Preferences   Learning Barriers Sight   readers   Learning Preferences Audio;Computer/Internet;Group Instruction;Skilled Demonstration;Verbal Instruction;Video;Written Material;Pictoral;Individual Instruction          Education Topics:  Knowledge Questionnaire Score:  Knowledge Questionnaire Score - 07/25/23 1131       Knowledge Questionnaire Score   Pre Score 20/24          Core Components/Risk Factors/Patient Goals at Admission:  Personal Goals and Risk Factors at Admission -  07/25/23 1132       Core  Components/Risk Factors/Patient Goals on Admission    Weight Management Yes;Weight Loss;Obesity    Intervention Weight Management: Develop a combined nutrition and exercise program designed to reach desired caloric intake, while maintaining appropriate intake of nutrient and fiber, sodium and fats, and appropriate energy expenditure required for the weight goal.;Weight Management: Provide education and appropriate resources to help participant work on and attain dietary goals.;Weight Management/Obesity: Establish reasonable short term and long term weight goals.;Obesity: Provide education and appropriate resources to help participant work on and attain dietary goals.    Goal Weight: Long Term 185 lb (83.9 kg)   pt goal   Expected Outcomes Short Term: Continue to assess and modify interventions until short term weight is achieved;Long Term: Adherence to nutrition and physical activity/exercise program aimed toward attainment of established weight goal;Weight Loss: Understanding of general recommendations for a balanced deficit meal plan, which promotes 1-2 lb weight loss per week and includes a negative energy balance of [phone] kcal/d;Understanding recommendations for meals to include 15-35% energy as protein, 25-35% energy from fat, 35-60% energy from carbohydrates, less than 200mg  of dietary cholesterol, 20-35 gm of total fiber daily;Understanding of distribution of calorie intake throughout the day with the consumption of 4-5 meals/snacks    Hypertension Yes    Intervention Provide education on lifestyle modifcations including regular physical activity/exercise, weight management, moderate sodium restriction and increased consumption of fresh fruit, vegetables, and low fat dairy, alcohol moderation, and smoking cessation.;Monitor prescription use compliance.    Expected Outcomes Short Term: Continued assessment and intervention until BP is < 140/36mm HG in hypertensive  participants. < 130/6mm HG in hypertensive participants with diabetes, heart failure or chronic kidney disease.;Long Term: Maintenance of blood pressure at goal levels.    Lipids Yes    Intervention Provide education and support for participant on nutrition & aerobic/resistive exercise along with prescribed medications to achieve LDL 70mg , HDL >40mg .    Expected Outcomes Short Term: Participant states understanding of desired cholesterol values and is compliant with medications prescribed. Participant is following exercise prescription and nutrition guidelines.;Long Term: Cholesterol controlled with medications as prescribed, with individualized exercise RX and with personalized nutrition plan. Value goals: LDL < 70mg , HDL > 40 mg.          Core Components/Risk Factors/Patient Goals Review:   Goals and Risk Factor Review     Row Name 08/01/23 1819 08/23/23 1644 09/27/23 1602         Core Components/Risk Factors/Patient Goals Review   Personal Goals Review Weight Management/Obesity;Hypertension;Lipids Weight Management/Obesity;Hypertension;Lipids Weight Management/Obesity;Hypertension;Lipids     Review Thaddaeus started cardiac rehab on 08/01/23. Talon did well with exercise. Vital signs were stable. Hernan did exceed his target heart rate as Dr Wendel discontinued his betablocker at his last office visit. Julez is doing well with exercise at cardiac rehab. Vital signs have been stable. Noelle has received an increased target heart rate goal from cardiologist.  BPs stable, pt has increased his MET level. Denzal continues to do well with exercise at cardiac rehab. Vital signs have been stable. Haydon's vital signs have remained stable, Pt continues increased his MET level. Alric will complete cardiac rehab at the begining of September     Expected Outcomes Dquan will continue to participate in cardiac rehab for exercise, nutrition and lifestyle modificaitons Leng will continue to participate in cardiac rehab for  exercise, nutrition and lifestyle modificaitons Anais will continue to participate in cardiac rehab for exercise, nutrition and lifestyle modificaitons        Core  Components/Risk Factors/Patient Goals at Discharge (Final Review):   Goals and Risk Factor Review - 09/27/23 1602       Core Components/Risk Factors/Patient Goals Review   Personal Goals Review Weight Management/Obesity;Hypertension;Lipids    Review Durell continues to do well with exercise at cardiac rehab. Vital signs have been stable. Cai's vital signs have remained stable, Pt continues increased his MET level. 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 Introduction to pritikin education/intensive cardiac rehab. Initial orientation packet reviewed with patient. 30-day ITP reviewe. [place] tolerated low intensity exercise fairly well but exceeded target heart rate range. Will continue to monitor. 30-day ITP reviewed. Pt has good attendance and participation with exercise at cardiac rehab 30-day ITP reviewed. Pt continues to have  good attendance and participation with exercise at cardiac rehab       Comments: See ITP Comments

## 2023-09-28 ENCOUNTER — Encounter (HOSPITAL_COMMUNITY)
Admission: RE | Admit: 2023-09-28 | Discharge: 2023-09-28 | Disposition: A | Discharge status: Home / Self Care | Source: Ambulatory Visit | Attending: Internal Medicine | Admitting: Internal Medicine

## 2023-09-28 DIAGNOSIS — Z48812 Encounter for surgical aftercare following surgery on the circulatory system: Secondary | ICD-10-CM | POA: Diagnosis not present

## 2023-09-28 DIAGNOSIS — Z951 Presence of aortocoronary bypass graft: Secondary | ICD-10-CM | POA: Diagnosis not present

## 2023-09-30 ENCOUNTER — Encounter (HOSPITAL_COMMUNITY)
Admission: RE | Admit: 2023-09-30 | Discharge: 2023-09-30 | Disposition: A | Discharge status: Home / Self Care | Source: Ambulatory Visit | Attending: Internal Medicine | Admitting: Internal Medicine

## 2023-09-30 DIAGNOSIS — Z951 Presence of aortocoronary bypass graft: Secondary | ICD-10-CM

## 2023-09-30 DIAGNOSIS — Z48812 Encounter for surgical aftercare following surgery on the circulatory system: Secondary | ICD-10-CM | POA: Diagnosis not present

## 2023-10-03 ENCOUNTER — Encounter (HOSPITAL_COMMUNITY)
Admission: RE | Admit: 2023-10-03 | Discharge: 2023-10-03 | Disposition: A | Discharge status: Home / Self Care | Source: Ambulatory Visit | Attending: Internal Medicine

## 2023-10-03 DIAGNOSIS — Z48812 Encounter for surgical aftercare following surgery on the circulatory system: Secondary | ICD-10-CM | POA: Diagnosis not present

## 2023-10-03 DIAGNOSIS — Z951 Presence of aortocoronary bypass graft: Secondary | ICD-10-CM

## 2023-10-05 ENCOUNTER — Encounter (HOSPITAL_COMMUNITY)
Admission: RE | Admit: 2023-10-05 | Discharge: 2023-10-05 | Disposition: A | Discharge status: Home / Self Care | Source: Ambulatory Visit | Attending: Internal Medicine

## 2023-10-05 DIAGNOSIS — Z48812 Encounter for surgical aftercare following surgery on the circulatory system: Secondary | ICD-10-CM | POA: Diagnosis not present

## 2023-10-05 DIAGNOSIS — Z951 Presence of aortocoronary bypass graft: Secondary | ICD-10-CM

## 2023-10-07 ENCOUNTER — Encounter (HOSPITAL_COMMUNITY)
Admission: RE | Admit: 2023-10-07 | Discharge: 2023-10-07 | Disposition: A | Discharge status: Home / Self Care | Source: Ambulatory Visit | Attending: Internal Medicine | Admitting: Internal Medicine

## 2023-10-07 DIAGNOSIS — Z48812 Encounter for surgical aftercare following surgery on the circulatory system: Secondary | ICD-10-CM | POA: Diagnosis not present

## 2023-10-07 DIAGNOSIS — Z951 Presence of aortocoronary bypass graft: Secondary | ICD-10-CM

## 2023-10-10 ENCOUNTER — Encounter (HOSPITAL_COMMUNITY)
Admission: RE | Admit: 2023-10-10 | Discharge: 2023-10-10 | Disposition: A | Discharge status: Home / Self Care | Source: Ambulatory Visit | Attending: Internal Medicine

## 2023-10-10 DIAGNOSIS — Z951 Presence of aortocoronary bypass graft: Secondary | ICD-10-CM | POA: Diagnosis not present

## 2023-10-10 DIAGNOSIS — Z48812 Encounter for surgical aftercare following surgery on the circulatory system: Secondary | ICD-10-CM | POA: Diagnosis not present

## 2023-10-12 ENCOUNTER — Encounter (HOSPITAL_COMMUNITY)
Admission: RE | Admit: 2023-10-12 | Discharge: 2023-10-12 | Disposition: A | Discharge status: Home / Self Care | Source: Ambulatory Visit | Attending: Internal Medicine

## 2023-10-12 DIAGNOSIS — Z48812 Encounter for surgical aftercare following surgery on the circulatory system: Secondary | ICD-10-CM | POA: Diagnosis not present

## 2023-10-12 DIAGNOSIS — Z951 Presence of aortocoronary bypass graft: Secondary | ICD-10-CM

## 2023-10-13 DIAGNOSIS — Z85828 Personal history of other malignant neoplasm of skin: Secondary | ICD-10-CM | POA: Diagnosis not present

## 2023-10-13 DIAGNOSIS — R229 Localized swelling, mass and lump, unspecified: Secondary | ICD-10-CM | POA: Diagnosis not present

## 2023-10-13 DIAGNOSIS — L814 Other melanin hyperpigmentation: Secondary | ICD-10-CM | POA: Diagnosis not present

## 2023-10-13 DIAGNOSIS — Z08 Encounter for follow-up examination after completed treatment for malignant neoplasm: Secondary | ICD-10-CM | POA: Diagnosis not present

## 2023-10-13 DIAGNOSIS — Z789 Other specified health status: Secondary | ICD-10-CM | POA: Diagnosis not present

## 2023-10-13 DIAGNOSIS — L820 Inflamed seborrheic keratosis: Secondary | ICD-10-CM | POA: Diagnosis not present

## 2023-10-13 DIAGNOSIS — D225 Melanocytic nevi of trunk: Secondary | ICD-10-CM | POA: Diagnosis not present

## 2023-10-13 DIAGNOSIS — L538 Other specified erythematous conditions: Secondary | ICD-10-CM | POA: Diagnosis not present

## 2023-10-13 DIAGNOSIS — L2989 Other pruritus: Secondary | ICD-10-CM | POA: Diagnosis not present

## 2023-10-13 DIAGNOSIS — R208 Other disturbances of skin sensation: Secondary | ICD-10-CM | POA: Diagnosis not present

## 2023-10-13 DIAGNOSIS — L821 Other seborrheic keratosis: Secondary | ICD-10-CM | POA: Diagnosis not present

## 2023-10-14 ENCOUNTER — Encounter (HOSPITAL_COMMUNITY)
Admission: RE | Admit: 2023-10-14 | Discharge: 2023-10-14 | Disposition: A | Discharge status: Home / Self Care | Source: Ambulatory Visit | Attending: Internal Medicine | Admitting: Internal Medicine

## 2023-10-14 DIAGNOSIS — Z48812 Encounter for surgical aftercare following surgery on the circulatory system: Secondary | ICD-10-CM | POA: Diagnosis not present

## 2023-10-14 DIAGNOSIS — Z951 Presence of aortocoronary bypass graft: Secondary | ICD-10-CM | POA: Diagnosis not present

## 2023-10-17 NOTE — Progress Notes (Signed)
 " Cardiology Office Note    Patient Name: Tim Walters Date of Encounter: 10/17/2023  Primary Care Provider:  Caro Harlene POUR, NP Primary Cardiologist:  Lurena POUR Red, MD Primary Electrophysiologist: None   Past Medical History    Past Medical History:  Diagnosis Date   Anginal pain (HCC)    Arthritis    lt ankle   Basal cell carcinoma    x2 (excised)   Breast lump    Per records from [facility]    Colon polyp    Coronary artery disease    a. BMS to RCA 2003 with residual LAD/diag disease treated medically, normal EF.   COVID    Depression    Diverticulosis    Per records from [facility]    GERD (gastroesophageal reflux disease)    Hyperkalemia    a. K of 5.2 in 2017.   Hyperlipidemia    Hypertension    Idiopathic peripheral neuropathy    Per records from [facility]    Impaired fasting glucose    Per records from [facility]    Kidney stones    Monoclonal gammopathy of undetermined significance    Per records from [facility]    Myocardial infarction [facility]) 11/15/2001   Dr. Victory Sharps [facility])   OSA (obstructive sleep apnea)    Per [facility] New Patient Packet   Peripheral neuropathy    Left Foot, Per [facility] New Patient Packet   Pre-diabetes    Sleep apnea    had test several yr ago-said he did not need a cpap-still snores   Torn rotator cuff    Per [facility] New Patient Packet   Venous insufficiency of left leg    Per [facility] New Patient Packet   Wears glasses     History of Present Illness  Tim Walters  is a 78 year old male with a PMH of CAD s/p CABG x 4 on 06/08/2023 (LIMA to LAD, SVG to OM, SVG to PDA, and SVG to diagonal vessel), HTN, HLD, OSA (not on CPAP), GERD, MGUS, aortic atherosclerosis who presents today for 27-month follow-up.  Tim Walters was last seen in clinic on 07/22/2023 for follow-up after recent CABG x 4 on 06/08/2023.  He was seen by Dr. Red in February and noted chest pain and was started on Imdur .  He  was seen in follow-up on 4/25 and reported no significant change to chest pain and underwent LHC that demonstrated severe two-vessel disease and was referred to CVTS and underwent CABG x 4 on 06/08/2023. Postoperatively, he developed sinus tachycardia with PVCs and ventricular bigeminy that was managed well with metoprolol .  During his visit on 07/22/2023 he reported doing well and was completing cardiac rehab at that time.  Tim Walters presents today for 11-month follow-up.  Reports since his previous follow-up no dizziness or fatigue related to his medications. His blood pressure is stable, though slightly higher than usual, with occasional higher readings. Exercise significantly lowers his blood pressure. He has no chest tightness or discomfort and can walk three and a half miles without shortness of breath. He is actively participating in cardiac rehab and plans to continue exercising at the [facility] and walking in his neighborhood.  He mentions numbness and tenderness near his surgical incision, which has decreased over time. This numbness has decreased over time, and he has no issues with coughing or sneezing post-surgery. He has lost weight since his surgery, from 224 pounds to 195 pounds, attributing this to  his exercise regimen and dietary changes. Patient denies chest pain, palpitations, dyspnea, PND, orthopnea, nausea, vomiting, dizziness, syncope, edema, weight gain, or early satiety.  Discussed the use of AI scribe software for clinical note transcription with the patient, who gave verbal consent to proceed.  History of Present Illness   Review of Systems  Please see the history of present illness.    All other systems reviewed and are otherwise negative except as noted above.  Physical Exam    Wt Readings from Last 3 Encounters:  07/25/23 200 lb 6.4 oz (90.9 kg)  07/22/23 197 lb (89.4 kg)  07/15/23 195 lb (88.5 kg)   CD:Uyzmz were no vitals filed for this visit.,There is no height or weight  on file to calculate BMI. GEN: Well nourished, well developed in no acute distress Neck: No JVD; No carotid bruits Pulmonary: Clear to auscultation without rales, wheezing or rhonchi  Cardiovascular: Normal rate. Regular rhythm. Normal S1. Normal S2.   Murmurs: There is no murmur.  ABDOMEN: Soft, non-tender, non-distended EXTREMITIES:  No edema; No deformity   EKG/LABS/ Recent Cardiac Studies   ECG personally reviewed by me today -sinus bradycardia with PAC no acute changes consistent with previous EKG.  Risk Assessment/Calculations:          Lab Results  Component Value Date   WBC 8.0 08/31/2023   HGB 12.8 (L) 08/31/2023   HCT 40.6 08/31/2023   MCV 95 08/31/2023   PLT 200 08/31/2023   Lab Results  Component Value Date   CREATININE 0.84 08/31/2023   BUN 29 (H) 08/31/2023   NA 140 08/31/2023   K 4.4 08/31/2023   CL 103 08/31/2023   CO2 20 08/31/2023   Lab Results  Component Value Date   CHOL 108 05/17/2023   HDL 61 05/17/2023   LDLCALC 33 05/17/2023   TRIG 49 05/17/2023   CHOLHDL 1.8 05/17/2023    Lab Results  Component Value Date   HGBA1C 6.0 (H) 06/07/2023   Assessment & Plan    Assessment and Plan Assessment & Plan Atherosclerotic heart disease of native coronary artery status post coronary artery bypass grafting Status post coronary artery bypass grafting in April with significant symptom improvement. No current chest pain, shortness of breath, dizziness, or fatigue. EKG shows premature atrial contractions without significant arrhythmia or heart block. - Continue metoprolol , ramipril , Zetia , and Lipitor . - Monitor blood pressure at home, report if systolic remains in the 140s. - Continue regular exercise with walking and swimming. - Follow up with primary care for cholesterol check in October.  Essential hypertension Blood pressure well-controlled with current medication. Occasional readings in the 140s. Exercise aids in management. - Continue current  antihypertensive regimen. - Monitor blood pressure at home, report if systolic remains in the 140s.  Hyperlipidemia Cholesterol levels well-controlled with current medication. LDL optimal. - Continue Zetia  and Lipitor . - Follow up with primary care for cholesterol check in October.  Premature atrial contractions EKG shows occasional premature atrial contractions, asymptomatic and benign. Metoprolol  effective in suppression. - Continue metoprolol  therapy. - Monitor for symptoms such as palpitations or fatigue, report if he occurs.  Post-surgical chest wall numbness and tenderness Numbness and tenderness at surgical site likely due to nerve damage. Symptoms decreased over time, no significant concerns unless worsening or radiating. - Monitor symptoms, report if he worsens or radiates down the arm. - Avoid heavy lifting, start with light weights, gradually increase as tolerated.   1.  Coronary artery disease: -s/p CABG x 4 with (  LIMA to LAD, SVG to OM, SVG to PDA, and SVG to diagonal vessel) - Today patient reports no chest pain or angina since previous visit. - Continue metoprolol , ramipril , Zetia , and Lipitor . - Monitor blood pressure at home, report if systolic remains in the 140s. - Continue regular exercise with walking and swimming. - Follow up with primary care for cholesterol check in October.  2.  Essential hypertension: - Patient's blood pressure today was stable at 126/54 - Continue ramipril  10 mg  3.  Hyperlipidemia -Cholesterol levels well-controlled with current medication. LDL optimal. - Continue Zetia  and Lipitor . - Follow up with primary care for cholesterol check in October.  4.  Aortic atherosclerosis: - Continue current GDMT with ezetimibe  10 mg and Lipitor  80 mg  5.  CKD stage II: - Patient's last creatinine was stable at 0.8 - Good hydration and current medication as prescribed.  6. Premature atrial contraction: EKG shows occasional premature atrial  contractions, asymptomatic and benign. Metoprolol  effective in suppression. - Continue metoprolol  therapy. - Monitor for symptoms such as palpitations or fatigue, report if he occurs.  Disposition: Follow-up with Arun K Thukkani, MD or APP in 6 months    Signed, Wyn Raddle, Jackee Shove, NP 10/17/2023, 11:44 AM [facility] "

## 2023-10-18 ENCOUNTER — Ambulatory Visit: Attending: Nurse Practitioner | Admitting: Nurse Practitioner

## 2023-10-18 ENCOUNTER — Encounter: Payer: Self-pay | Admitting: Nurse Practitioner

## 2023-10-18 ENCOUNTER — Ambulatory Visit: Admitting: Physician Assistant

## 2023-10-18 VITALS — BP 126/54 | HR 58 | Ht 67.0 in | Wt 196.0 lb

## 2023-10-18 DIAGNOSIS — I2510 Atherosclerotic heart disease of native coronary artery without angina pectoris: Secondary | ICD-10-CM

## 2023-10-18 DIAGNOSIS — I491 Atrial premature depolarization: Secondary | ICD-10-CM | POA: Diagnosis not present

## 2023-10-18 DIAGNOSIS — I700 Atherosclerosis of aorta: Secondary | ICD-10-CM

## 2023-10-18 DIAGNOSIS — N182 Chronic kidney disease, stage 2 (mild): Secondary | ICD-10-CM

## 2023-10-18 DIAGNOSIS — I10 Essential (primary) hypertension: Secondary | ICD-10-CM

## 2023-10-18 DIAGNOSIS — E785 Hyperlipidemia, unspecified: Secondary | ICD-10-CM

## 2023-10-18 NOTE — Patient Instructions (Signed)
 Medication Instructions:  Your physician recommends that you continue on your current medications as directed. Please refer to the Current Medication list given to you today. *If you need a refill on your cardiac medications before your next appointment, please call your pharmacy*  Lab Work: None ordered If you have labs (blood work) drawn today and your tests are completely normal, you will receive your results only by: MyChart Message (if you have MyChart) OR A paper copy in the mail If you have any lab test that is abnormal or we need to change your treatment, we will call you to review the results.  Testing/Procedures: None ordered  Follow-Up: At [facility], you and your health needs are our priority.  As part of our continuing mission to provide you with exceptional heart care, our providers are all part of one team.  This team includes your primary Cardiologist (physician) and Advanced Practice Providers or APPs (Physician Assistants and Nurse Practitioners) who all work together to provide you with the care you need, when you need it.  Your next appointment:   6 month(s)  Provider:   Arun K Thukkani, MD or ANY APP  We recommend signing up for the patient portal called MyChart.  Sign up information is provided on this After Visit Summary.  MyChart is used to connect with patients for Virtual Visits (Telemedicine).  Patients are able to view lab/test results, encounter notes, upcoming appointments, etc.  Non-urgent messages can be sent to your provider as well.   To learn more about what you can do with MyChart, go to [email or web address].   Other Instructions

## 2023-10-19 ENCOUNTER — Encounter (HOSPITAL_COMMUNITY)
Admission: RE | Admit: 2023-10-19 | Discharge: 2023-10-19 | Disposition: A | Discharge status: Home / Self Care | Source: Ambulatory Visit | Attending: Internal Medicine | Admitting: Internal Medicine

## 2023-10-19 DIAGNOSIS — Z951 Presence of aortocoronary bypass graft: Secondary | ICD-10-CM | POA: Insufficient documentation

## 2023-10-19 NOTE — Progress Notes (Signed)
 Discharge Progress Report  Patient Details  Name: Marcoantonio Legault MRN: [id number] Date of Birth: [birth year] Referring Provider:   Flowsheet Row INTENSIVE CARDIAC REHAB ORIENT from 07/25/2023 in [facility]  Referring Provider Lurena Red, MD     Number of Visits: 24  Reason for Discharge:  Patient reached a stable level of exercise. Patient independent in their exercise. Patient has met program and personal goals.  Smoking History:  Social History   Tobacco Use  Smoking Status Former   Current packs/day: 0.00   Types: Cigarettes   Start date: 02/15/1962   Quit date: 02/15/1974   Years since quitting: 49.7  Smokeless Tobacco Never    Diagnosis:  S/P CABG x 4  ADL UCSD:   Initial Exercise Prescription:  Initial Exercise Prescription - 07/25/23 1300       Date of Initial Exercise RX and Referring Provider   Date 07/25/23    Referring Provider Lurena Red, MD    Expected Discharge Date 10/19/23      Recumbant Elliptical   Level 1    RPM 50    Watts 80    Minutes 15    METs 2.2      Track   Laps 16    Minutes 15    METs 2      Prescription Details   Frequency (times per week) 3    Duration Progress to 30 minutes of continuous aerobic without signs/symptoms of physical distress      Intensity   THRR 40-80% of Max Heartrate 56-113    Ratings of Perceived Exertion 11-13    Perceived Dyspnea 0-4      Progression   Progression Continue progressive overload as per policy without signs/symptoms or physical distress.      Resistance Training   Training Prescription Yes    Weight 3    Reps 10-15          Discharge Exercise Prescription (Final Exercise Prescription Changes):  Exercise Prescription Changes - 10/03/23 1028       Response to Exercise   Blood Pressure (Admit) 110/60    Blood Pressure (Exit) 98/68    Heart Rate (Admit) 53 bpm    Heart Rate (Exercise) 117 bpm    Heart Rate  (Exit) 62 bpm    Rating of Perceived Exertion (Exercise) 13    Symptoms None    Duration Continue with 30 min of aerobic exercise without signs/symptoms of physical distress.    Intensity THRR unchanged      Progression   Progression Continue to progress workloads to maintain intensity without signs/symptoms of physical distress.    Average METs 5      Resistance Training   Training Prescription Yes    Weight 6 lbs    Reps 10-15    Time 5 Minutes      Interval Training   Interval Training No      Recumbant Elliptical   Level 7    RPM 73    Watts 140    Minutes 15    METs 6.1      Track   Laps 23    Minutes 15    METs 3.93      Home Exercise Plan   Plans to continue exercise at Home (comment)   Swimming at pool at son's residence.   Frequency Add 4 additional days to program exercise sessions.    Initial Home Exercises Provided 08/15/23  Functional Capacity:  6 Minute Walk     Row Name 07/25/23 1324 10/12/23 1044       6 Minute Walk   Phase Initial Discharge    Distance 1466 feet 2085 feet    Distance % Change -- 42.22 %    Distance Feet Change -- 619 ft    Walk Time 6 minutes 6 minutes    # of Rest Breaks 0 0    MPH 2.78 3.95    METS 2.55 3.9    RPE 11 11    Perceived Dyspnea  0 0    VO2 Peak 8.93 13.65    Symptoms No No    Resting HR 52 bpm 55 bpm    Resting BP 118/58 110/58    Resting Oxygen Saturation  96 % --    Exercise Oxygen Saturation  during 6 min walk 96 % 99 %    Max Ex. HR 94 bpm 106 bpm    Max Ex. BP 146/66 158/72    2 Minute Post BP 122/70 --       Psychological, QOL, Others - Outcomes: PHQ 2/9:    10/14/2023    4:38 PM 07/25/2023   11:26 AM 07/04/2023    1:43 PM 07/01/2023    2:10 PM 06/24/2023   11:19 AM  Depression screen PHQ 2/9  Decreased Interest 0 0 0 0 0  Down, Depressed, Hopeless 0 0 0 0 0  PHQ - 2 Score 0 0 0 0 0  Altered sleeping 0 0 0    Tired, decreased energy 0 1 2    Change in appetite 0 0 0    Feeling  bad or failure about yourself  0 0 0    Trouble concentrating 0 0 0    Moving slowly or fidgety/restless 0 0 0    Suicidal thoughts 0 0 0    PHQ-9 Score 0 1 2    Difficult doing work/chores  Somewhat difficult Not difficult at all      Quality of Life:  Quality of Life - 10/18/23 1626       Quality of Life   Select Quality of Life      Quality of Life Scores   Health/Function Pre 25.33 %    Health/Function Post 27.07 %    Health/Function % Change 6.87 %    Socioeconomic Pre 26.43 %    Socioeconomic Post 28.21 %    Socioeconomic % Change  6.73 %    Psych/Spiritual Pre 24.67 %    Psych/Spiritual Post 25.67 %    Psych/Spiritual % Change 4.05 %    Family Pre 21.6 %    Family Post 24 %    Family % Change 11.11 %    GLOBAL Pre 24.88 %    GLOBAL Post 26.59 %    GLOBAL % Change 6.87 %          Personal Goals: Goals established at orientation with interventions provided to work toward goal.  Personal Goals and Risk Factors at Admission - 07/25/23 1132       Core Components/Risk Factors/Patient Goals on Admission    Weight Management Yes;Weight Loss;Obesity    Intervention Weight Management: Develop a combined nutrition and exercise program designed to reach desired caloric intake, while maintaining appropriate intake of nutrient and fiber, sodium and fats, and appropriate energy expenditure required for the weight goal.;Weight Management: Provide education and appropriate resources to help participant work on and attain dietary goals.;Weight Management/Obesity: Establish reasonable  short term and long term weight goals.;Obesity: Provide education and appropriate resources to help participant work on and attain dietary goals.    Goal Weight: Long Term 185 lb (83.9 kg)   pt goal   Expected Outcomes Short Term: Continue to assess and modify interventions until short term weight is achieved;Long Term: Adherence to nutrition and physical activity/exercise program aimed toward  attainment of established weight goal;Weight Loss: Understanding of general recommendations for a balanced deficit meal plan, which promotes 1-2 lb weight loss per week and includes a negative energy balance of [phone] kcal/d;Understanding recommendations for meals to include 15-35% energy as protein, 25-35% energy from fat, 35-60% energy from carbohydrates, less than 200mg  of dietary cholesterol, 20-35 gm of total fiber daily;Understanding of distribution of calorie intake throughout the day with the consumption of 4-5 meals/snacks    Hypertension Yes    Intervention Provide education on lifestyle modifcations including regular physical activity/exercise, weight management, moderate sodium restriction and increased consumption of fresh fruit, vegetables, and low fat dairy, alcohol moderation, and smoking cessation.;Monitor prescription use compliance.    Expected Outcomes Short Term: Continued assessment and intervention until BP is < 140/51mm HG in hypertensive participants. < 130/52mm HG in hypertensive participants with diabetes, heart failure or chronic kidney disease.;Long Term: Maintenance of blood pressure at goal levels.    Lipids Yes    Intervention Provide education and support for participant on nutrition & aerobic/resistive exercise along with prescribed medications to achieve LDL 70mg , HDL >40mg .    Expected Outcomes Short Term: Participant states understanding of desired cholesterol values and is compliant with medications prescribed. Participant is following exercise prescription and nutrition guidelines.;Long Term: Cholesterol controlled with medications as prescribed, with individualized exercise RX and with personalized nutrition plan. Value goals: LDL < 70mg , HDL > 40 mg.           Personal Goals Discharge:  Goals and Risk Factor Review     Row Name 08/01/23 1819 08/23/23 1644 09/27/23 1602         Core Components/Risk Factors/Patient Goals Review   Personal Goals Review  Weight Management/Obesity;Hypertension;Lipids Weight Management/Obesity;Hypertension;Lipids Weight Management/Obesity;Hypertension;Lipids     Review Arek started cardiac rehab on 08/01/23. Chriss did well with exercise. Vital signs were stable. Zakk did exceed his target heart rate as Dr Wendel discontinued his betablocker at his last office visit. Hubert is doing well with exercise at cardiac rehab. Vital signs have been stable. Braelin has received an increased target heart rate goal from cardiologist.  BPs stable, pt has increased his MET level. Delynn continues to do well with exercise at cardiac rehab. Vital signs have been stable. Tyberius's vital signs have remained stable, Pt continues increased his MET level. Shahir will complete cardiac rehab at the begining of September     Expected Outcomes Vannie will continue to participate in cardiac rehab for exercise, nutrition and lifestyle modificaitons Seichi will continue to participate in cardiac rehab for exercise, nutrition and lifestyle modificaitons Curvin will continue to participate in cardiac rehab for exercise, nutrition and lifestyle modificaitons        Exercise Goals and Review:  Exercise Goals     Row Name 07/25/23 1130             Exercise Goals   Increase Physical Activity Yes       Intervention Provide advice, education, support and counseling about physical activity/exercise needs.;Develop an individualized exercise prescription for aerobic and resistive training based on initial evaluation findings, risk stratification, comorbidities and participant's personal  goals.       Expected Outcomes Short Term: Attend rehab on a regular basis to increase amount of physical activity.;Long Term: Exercising regularly at least 3-5 days a week.;Long Term: Add in home exercise to make exercise part of routine and to increase amount of physical activity.       Increase Strength and Stamina Yes       Intervention Provide advice, education, support and  counseling about physical activity/exercise needs.;Develop an individualized exercise prescription for aerobic and resistive training based on initial evaluation findings, risk stratification, comorbidities and participant's personal goals.       Expected Outcomes Short Term: Increase workloads from initial exercise prescription for resistance, speed, and METs.;Short Term: Perform resistance training exercises routinely during rehab and add in resistance training at home;Long Term: Improve cardiorespiratory fitness, muscular endurance and strength as measured by increased METs and functional capacity ( )       Able to understand and use rate of perceived exertion (RPE) scale Yes       Intervention Provide education and explanation on how to use RPE scale       Expected Outcomes Short Term: Able to use RPE daily in rehab to express subjective intensity level;Long Term:  Able to use RPE to guide intensity level when exercising independently       Knowledge and understanding of Target Heart Rate Range (THRR) Yes       Intervention Provide education and explanation of THRR including how the numbers were predicted and where they are located for reference       Expected Outcomes Short Term: Able to state/look up THRR;Short Term: Able to use daily as guideline for intensity in rehab;Long Term: Able to use THRR to govern intensity when exercising independently       Understanding of Exercise Prescription Yes       Intervention Provide education, explanation, and written materials on patient's individual exercise prescription       Expected Outcomes Short Term: Able to explain program exercise prescription;Long Term: Able to explain home exercise prescription to exercise independently          Exercise Goals Re-Evaluation:  Exercise Goals Re-Evaluation     Row Name 08/01/23 1135 08/15/23 1050 09/09/23 1037 10/07/23 1104 10/14/23 1015     Exercise Goal Re-Evaluation   Exercise Goals Review Increase  Physical Activity;Increase Strength and Stamina;Able to understand and use rate of perceived exertion (RPE) scale Increase Physical Activity;Increase Strength and Stamina;Able to understand and use rate of perceived exertion (RPE) scale;Knowledge and understanding of Target Heart Rate Range (THRR);Able to check pulse independently;Understanding of Exercise Prescription Increase Physical Activity;Increase Strength and Stamina;Able to understand and use rate of perceived exertion (RPE) scale;Knowledge and understanding of Target Heart Rate Range (THRR);Able to check pulse independently;Understanding of Exercise Prescription Increase Physical Activity;Increase Strength and Stamina;Able to understand and use rate of perceived exertion (RPE) scale;Knowledge and understanding of Target Heart Rate Range (THRR);Able to check pulse independently;Understanding of Exercise Prescription Increase Physical Activity;Increase Strength and Stamina;Able to understand and use rate of perceived exertion (RPE) scale;Knowledge and understanding of Target Heart Rate Range (THRR);Able to check pulse independently;Understanding of Exercise Prescription   Comments Austin was able to understand and use RPE scale appropriately. Received clearance from Dr. Wendel to increase THRR to 40-90% age predicted max, 56-127 bpm. Reviewed exercise prescription and THRR increase with Deward. He is swimming 40-45 minutes 4 days/week. He has a smart watch to monitor his pulse. [facility] and can [facility] once the Fall comes and the pool is closed. Avel continues to progress well with exercise. He states he feels great. He feels less short of breath except going up inclines/ stairs. He is swimming 20 laps (50 yards) in 40-45 minutes. August will complete cardiac rehab soon, and he states that he has made great progress in the program. He continues to swim in addition to exercise at cardiac rehab. Upon completion of the program, [facility] of [facility] cardiac rehab. Once the outdoor pool closes, [facility] on Tues[facility] and Thrusday, and he will walk on Saturdays and Sundays. Kemauri has been pleased with his progress in the program. [facility], [facility], using the equipment, and walking when he's not swimming. He will discuss returning to the weight machines with his cardiologist office visit on Tuesday.   Expected Outcomes Progress workloads as tolerated to help improve cardiorespiratory fitness. Labarron will continue daily exercise routine to help achieve personal health and fitness goals. Harshal will continue daily exercise routine to help achieve personal health and fitness goals. Aldric [facility] and walking at [facility] to maintain health and fitness gains. Tavarius will [facility] and walking at home to maintain health and fitness gains.      Nutrition & Weight - Outcomes:  Pre Biometrics - 07/25/23 1124       Pre Biometrics   Waist Circumference 45 inches    Hip Circumference 41 inches    Waist to Hip Ratio 1.1 %    Triceps Skinfold 22 mm    % Body Fat 33.1 %    Grip Strength 28 kg    Flexibility --   not done- low back pain   Single Leg Stand 5.25 seconds          Post Biometrics - 10/14/23 1015        Post  Biometrics   Waist Circumference 44.25 inches    Hip Circumference 41 inches    Waist to Hip Ratio 1.08 %    Triceps Skinfold 6 mm    Grip Strength 28 kg    Flexibility --   Not performed due to low back pain   Single Leg Stand 6.12 seconds          Nutrition:  Nutrition Therapy & Goals - 09/26/23 1021       Nutrition Therapy   Diet Heart Healthy Diet    Drug/Food Interactions Statins/Certain Fruits      Personal Nutrition Goals   Nutrition Goal Patient to identify strategies for reducing cardiovascular risk by attending the [facility] education and nutrition series weekly.   goal  in action.   Personal Goal #2 Patient to improve diet quality by using the plate method as a guide for meal planning to include lean protein/plant protein, fruits, vegetables, whole grains, nonfat dairy as part of a well-balanced diet.   goal in action.   Personal Goal #3 Patient to identify strategies for weight loss with goal of 0.5-2.0# per week.   goal in action.   Comments Goals in action. Patient has medical history of CABGx4, CAD, hyperlipidemia (statin,zetia ), aortic atherosclerosis, CKD2. He continues to attend the [facility] education and nutrition series regularly.  LDL is at goal. A1c is in a prediabetic range. Patient is motivated to lose weight; have discussed strategies for weight loss  including calorie density, the plate method as a guide for meal planning, label reading,etc. He is down 2.2# since starting with our program. Patient will benefit from participation in intensive cardiac rehab and adherence to nutrition, exercise, and lifestyle modification.      Intervention Plan   Intervention Nutrition handout(s) given to patient.;Prescribe, educate and counsel regarding individualized specific dietary modifications aiming towards targeted core components such as weight, hypertension, lipid management, diabetes, heart failure and other comorbidities.    Expected Outcomes Short Term Goal: Understand basic principles of dietary content, such as calories, fat, sodium, cholesterol and nutrients.;Long Term Goal: Adherence to prescribed nutrition plan.          Nutrition Discharge:  Nutrition Assessments - 10/18/23 1621       Rate Your Plate Scores   Pre Score 50          Education Questionnaire Score:  Knowledge Questionnaire Score - 10/18/23 1615       Knowledge Questionnaire Score   Pre Score 20/24    Post Score 23/24          Goals reviewed with patient; copy given to patient.Pt graduates from  Intensive/Traditional cardiac rehab program on 10/19/23 with completion of  34 exercise and  22 education sessions. Pt maintained good attendance and progressed nicely during their participation in rehab as evidenced by increased MET level. Dave increased his distance on his post exercise walk test by 619 feet and lost 2 kg while enrolled in the program.   Medication list reconciled. Repeat  PHQ score- 0 .  Pt has made significant lifestyle changes and should be commended for his success. Hillis  achieved their goals during cardiac rehab. Voris  plans to continue exercise at the [facility]  walking and swimming. We are proud of Timoteo's progress and weight loss!Hadassah Elpidio Quan RN BSN

## 2023-11-15 NOTE — Addendum Note (Signed)
 Encounter addended by: Valere Arnoldo HERO on: 11/15/2023 7:34 AM  Actions taken: Flowsheet data copied forward, Flowsheet accepted

## 2023-11-29 ENCOUNTER — Other Ambulatory Visit

## 2023-11-29 DIAGNOSIS — I10 Essential (primary) hypertension: Secondary | ICD-10-CM | POA: Diagnosis not present

## 2023-11-29 DIAGNOSIS — E785 Hyperlipidemia, unspecified: Secondary | ICD-10-CM | POA: Diagnosis not present

## 2023-11-29 DIAGNOSIS — D649 Anemia, unspecified: Secondary | ICD-10-CM | POA: Diagnosis not present

## 2023-11-29 DIAGNOSIS — R739 Hyperglycemia, unspecified: Secondary | ICD-10-CM | POA: Diagnosis not present

## 2023-11-30 ENCOUNTER — Ambulatory Visit: Payer: Self-pay | Admitting: Nurse Practitioner

## 2023-12-02 ENCOUNTER — Ambulatory Visit: Admitting: Nurse Practitioner

## 2023-12-02 ENCOUNTER — Encounter: Payer: Self-pay | Admitting: Nurse Practitioner

## 2023-12-02 VITALS — BP 128/72 | HR 64 | Temp 98.1°F | Ht 67.0 in | Wt 200.6 lb

## 2023-12-02 DIAGNOSIS — D649 Anemia, unspecified: Secondary | ICD-10-CM

## 2023-12-02 DIAGNOSIS — E66811 Obesity, class 1: Secondary | ICD-10-CM

## 2023-12-02 DIAGNOSIS — E785 Hyperlipidemia, unspecified: Secondary | ICD-10-CM | POA: Diagnosis not present

## 2023-12-02 DIAGNOSIS — F325 Major depressive disorder, single episode, in full remission: Secondary | ICD-10-CM | POA: Diagnosis not present

## 2023-12-02 DIAGNOSIS — I25810 Atherosclerosis of coronary artery bypass graft(s) without angina pectoris: Secondary | ICD-10-CM

## 2023-12-02 DIAGNOSIS — M19041 Primary osteoarthritis, right hand: Secondary | ICD-10-CM | POA: Diagnosis not present

## 2023-12-02 DIAGNOSIS — E6609 Other obesity due to excess calories: Secondary | ICD-10-CM

## 2023-12-02 DIAGNOSIS — Z6831 Body mass index (BMI) 31.0-31.9, adult: Secondary | ICD-10-CM

## 2023-12-02 DIAGNOSIS — I10 Essential (primary) hypertension: Secondary | ICD-10-CM

## 2023-12-02 DIAGNOSIS — K219 Gastro-esophageal reflux disease without esophagitis: Secondary | ICD-10-CM | POA: Insufficient documentation

## 2023-12-02 NOTE — Assessment & Plan Note (Signed)
 Osteoarthritis affecting hand joints with joint popping and reduced grip strength during flare-ups. Managed with Voltaren gel. - Continue using Voltaren gel for symptomatic relief. - Encourage warm water therapy for symptomatic relief. - Avoid NSAIDs due to cardiac and renal considerations. - Consider orthopedic referral

## 2023-12-02 NOTE — Assessment & Plan Note (Signed)
 Blood pressure well controlled, goal bp <140/90 Continue current medications and dietary modifications follow metabolic panel

## 2023-12-02 NOTE — Assessment & Plan Note (Signed)
 GERD symptoms well-managed with omeprazole. - Attempt to reduce omeprazole to every other day. - Encourage continued dietary modifications to prevent GERD.

## 2023-12-02 NOTE — Assessment & Plan Note (Signed)
-  he is working on losing weight, education provided on healthy weight loss through increase in physical activity and proper nutrition.

## 2023-12-02 NOTE — Progress Notes (Signed)
 Patient had labs 10/14. Patient visit is 12/02/2023. During patient visit patient pcp Caro Harlene POUR, NP Informed to add current labs : Iron, TBIC, and Ferritin to current  lab results that were done on 11/29/2023. Labs have been added to sheet.

## 2023-12-02 NOTE — Assessment & Plan Note (Signed)
 S/p CABG x4, without chest pains at this time.  Continues on ASA 325 mg daily with metoprolol  and ramipril 

## 2023-12-02 NOTE — Assessment & Plan Note (Addendum)
 Cholesterol well controlled on lipitor  80 mg daily with zetia  10 mg daily

## 2023-12-02 NOTE — Assessment & Plan Note (Signed)
 Stable, Continues on zoloft  50 mg daily

## 2023-12-02 NOTE — Progress Notes (Signed)
 Careteam: Patient Care Team: Caro Harlene POUR, NP as PCP - General (Geriatric Medicine) Wendel Lurena POUR, MD as PCP - Cardiology (Cardiology) Gail Favorite, MD as Consulting Physician (General Surgery) Josefina Chew, MD as Consulting Physician (Orthopedic Surgery) Joshua Blamer, MD as Consulting Physician (Dermatology) Jenel Carlin POUR, MD (Inactive) as Consulting Physician (Neurology) Rayne Doffing, OD (Optometry) [facility], [place] Wendel Lurena POUR, MD as Consulting Physician (Cardiology)  PLACE OF SERVICE:  [facility]  Advanced Directive information    Allergies  Allergen Reactions   Demadex  [Torsemide ] Rash    Chief Complaint  Patient presents with   Medical Management of Chronic Issues    6 month routine  Arthritis in midde finger & thumb in left hand Has concerns about COVID vaccine / had last shot in May 2025 [facility] - flu vaccine was given     HPI:  Discussed the use of AI scribe software for clinical note transcription with the patient, who gave verbal consent to proceed.  History of Present Illness Tim Walters is an 78 year old male with coronary artery disease status post CABG who presents for a six-month follow-up.  He underwent coronary artery bypass grafting (CABG) on June 08, 2023, and has completed a three-month cardiac rehabilitation program. He feels excellent, with no chest pain or shortness of breath, and has resumed walking four miles daily. He has lost weight and two inches from his waist.  He reports that his cholesterol was good at his last follow-up, and his blood sugar and A1c have been borderline. His hemoglobin was 14.3 a year ago and is currently 34. No dark stools or significant bruising, although he does bruise on his arms with minor trauma.  He is currently taking ramipril  10 mg daily, Lopressor  50 mg twice daily, for hypertension  He is taking omeprazole for acid reflux, which he feels has  improved.   He also takes Zoloft  for mood, with no worsening of anxiety or depression.  He mentions arthritis in his hand joints, particularly affecting his grip when it flares up.   He reports tenderness at the vein harvest site and some improvement in breastbone discomfort post-surgery. He has reduced weight-bearing exercises and stops if he feels discomfort.  He recently passed a driver's test without glasses, noting his vision has improved to 20/20 in one eye and 20/40 in the other. He has not worn glasses for three years.   Review of Systems:  Review of Systems  Constitutional:  Negative for chills, fever and weight loss.  HENT:  Negative for tinnitus.   Respiratory:  Negative for cough, sputum production and shortness of breath.   Cardiovascular:  Negative for chest pain, palpitations and leg swelling.  Gastrointestinal:  Negative for abdominal pain, constipation, diarrhea and heartburn.  Genitourinary:  Negative for dysuria, frequency and urgency.  Musculoskeletal:  Negative for back pain, falls, joint pain and myalgias.  Skin: Negative.   Neurological:  Negative for dizziness and headaches.  Psychiatric/Behavioral:  Negative for depression and memory loss. The patient does not have insomnia.     Past Medical History:  Diagnosis Date   Anginal pain    Arthritis    lt ankle   Basal cell carcinoma    x2 (excised)   Breast lump    Per records from [facility]    Colon polyp    Coronary artery disease    a. BMS to RCA 2003 with residual LAD/diag disease treated medically, normal EF.  COVID    Depression    Diverticulosis    Per records from [facility]    GERD (gastroesophageal reflux disease)    Hyperkalemia    a. K of 5.2 in 2017.   Hyperlipidemia    Hypertension    Idiopathic peripheral neuropathy    Per records from [facility]    Impaired fasting glucose    Per records from [facility]    Kidney stones    Monoclonal gammopathy of  undetermined significance    Per records from [facility]    Myocardial infarction [facility]) 11/15/2001   Dr. Victory Sharps [facility])   OSA (obstructive sleep apnea)    Per [facility] New Patient Packet   Peripheral neuropathy    Left Foot, Per [facility] Packet   Pre-diabetes    Sleep apnea    had test several yr ago-said he did not need a cpap-still snores   Torn rotator cuff    Per [facility] New Patient Packet   Venous insufficiency of left leg    Per [facility] New Patient Packet   Wears glasses    Past Surgical History:  Procedure Laterality Date   ANGIOPLASTY  2003   Per records from [facility]    BASAL CELL CARCINOMA EXCISION     left tricep   BASAL CELL CARCINOMA EXCISION  11/10/2022   right shoulder   CARDIAC CATHETERIZATION  23003   stent rca   CARDIOVASCULAR STRESS TEST  02/16/2016   Per records from [facility]    COLONOSCOPY  06/17/2003   Per records from [facility], Dr.Ganem to be repeated 2015   CORONARY ARTERY BYPASS GRAFT N/A 06/08/2023   Procedure: CORONARY ARTERY BYPASS GRAFTING X FOUR, USING LEFT INTERNAL MAMMARY ARTERY AND ENDOSCOPICALLY HARVESTED RIGHT GREATER SAPHENOUS VEIN GRAFT;  Surgeon: Lucas Dorise POUR, MD;  Location: [facility];  Service: Open Heart Surgery;  Laterality: N/A;   coronary artery stent  11/15/2001   CORONARY STENT PLACEMENT  11/15/2001   Per records from [facility]    CYST REMOVAL TRUNK Left 05/08/2013   Procedure: CYST REMOVAL BACK;  Surgeon: Elon CHRISTELLA Pacini, MD;  Location: [facility];  Service: General;  Laterality: Left;   INGUINAL HERNIA REPAIR Left 03/24/2015   Procedure: OPEN REPAIR LEFT INGUINAL HERNIA ;  Surgeon: Elon Pacini, MD;  Location: [facility];  Service: General;  Laterality: Left;   INSERTION OF MESH Left 03/24/2015   Procedure: INSERTION OF MESH;  Surgeon: Elon Pacini, MD;  Location: [facility];  Service: General;  Laterality: Left;   INTRAOPERATIVE  TRANSESOPHAGEAL ECHOCARDIOGRAM N/A 06/08/2023   Procedure: ECHOCARDIOGRAM, TRANSESOPHAGEAL, INTRAOPERATIVE;  Surgeon: Lucas Dorise POUR, MD;  Location: [facility];  Service: Open Heart Surgery;  Laterality: N/A;   LEFT HEART CATH AND CORONARY ANGIOGRAPHY N/A 05/24/2023   Procedure: LEFT HEART CATH AND CORONARY ANGIOGRAPHY;  Surgeon: Swaz[place], Peter M, MD;  Location: [facility];  Service: Cardiovascular;  Laterality: N/A;   SHOULDER ARTHROSCOPY W/ ROTATOR CUFF REPAIR  2011   right   SPINE SURGERY  02/12/1993   L2, L3 fragmented disc   VASECTOMY  1987   VEIN REPAIR  09/2018   Vein injections    Social History:   reports that he quit smoking about 49 years ago. His smoking use included cigarettes. He started smoking about 61 years ago. He has never used smokeless tobacco. He reports that he does not currently use alcohol after a past usage  of about 21.0 standard drinks of alcohol per week. He reports that he does not use drugs.  Family History  Problem Relation Age of Onset   Macular degeneration Mother    COPD Mother    Non-Hodgkin's lymphoma Mother    Lung cancer Mother        Per [facility] New Patient Packet    Hypertension Father    Suicidality Father 98       Per [facility] New Patient Packet    Depression Father    Angina Father        Per records from [facility]    Diverticulosis Father        Per records from [facility]    Heart disease Father    High Cholesterol Sister    Macular degeneration Maternal Grandmother    Schizophrenia Son    Bipolar disorder Son    Autism Son    Post-traumatic stress disorder Son    Diabetes type II Daughter    Breast cancer Neg Hx    Colon cancer Neg Hx    Esophageal cancer Neg Hx    Stomach cancer Neg Hx    Liver disease Neg Hx    Pancreatic cancer Neg Hx     Medications: Patient's Medications  New Prescriptions   No medications on file  Previous Medications   ACETAMINOPHEN  (TYLENOL ) 325 MG TABLET    Take 2 tablets (650 mg total) by  mouth every 6 (six) hours as needed.   ASPIRIN  EC 81 MG TABLET    Take 81 mg by mouth daily. Swallow whole.   ATORVASTATIN  (LIPITOR ) 80 MG TABLET    TAKE 1 TABLET BY MOUTH ONCE  DAILY   EZETIMIBE  (ZETIA ) 10 MG TABLET    TAKE 1 TABLET BY MOUTH DAILY   METOPROLOL  TARTRATE (LOPRESSOR ) 50 MG TABLET    Take 1 tablet (50 mg total) by mouth 2 (two) times daily.   OMEPRAZOLE (PRILOSEC) 20 MG CAPSULE    Take 20 mg by mouth daily.   RAMIPRIL  (ALTACE ) 10 MG CAPSULE    Take 1 capsule (10 mg total) by mouth daily.   SERTRALINE  (ZOLOFT ) 50 MG TABLET    TAKE 1 TABLET BY MOUTH DAILY  Modified Medications   No medications on file  Discontinued Medications   No medications on file    Physical Exam:  Vitals:   12/02/23 1317  BP: 128/72  Pulse: 64  Temp: 98.1 F (36.7 C)  SpO2: 94%  Weight: 200 lb 9.6 oz (91 kg)  Height: 5' 7 (1.702 m)   Body mass index is 31.42 kg/m. Wt Readings from Last 3 Encounters:  12/02/23 200 lb 9.6 oz (91 kg)  10/18/23 196 lb (88.9 kg)  07/25/23 200 lb 6.4 oz (90.9 kg)    Physical Exam Constitutional:      General: He is not in acute distress.    Appearance: He is well-developed. He is not diaphoretic.  HENT:     Head: Normocephalic and atraumatic.     Right Ear: External ear normal.     Left Ear: External ear normal.     Mouth/Throat:     Pharynx: No oropharyngeal exudate.  Eyes:     Conjunctiva/sclera: Conjunctivae normal.     Pupils: Pupils are equal, round, and reactive to light.  Cardiovascular:     Rate and Rhythm: Normal rate and regular rhythm.     Heart sounds: Normal heart sounds.  Pulmonary:     Effort: Pulmonary effort  is normal.     Breath sounds: Normal breath sounds.  Abdominal:     General: Bowel sounds are normal.     Palpations: Abdomen is soft.  Musculoskeletal:        General: No tenderness.     Cervical back: Normal range of motion and neck supple.     Right lower leg: No edema.     Left lower leg: No edema.  Skin:    General:  Skin is warm and dry.  Neurological:     Mental Status: He is alert and oriented to person, place, and time.     Labs reviewed: Basic Metabolic Panel: Recent Labs    06/09/23 0408 06/09/23 1720 06/10/23 0413 06/13/23 1013 06/14/23 0327 08/03/23 0830 08/31/23 1320 11/29/23 0821  NA 133* 132*   < >  --    < > 139 140 140  K 4.4 4.6   < >  --    < > 4.4 4.4 4.2  CL 104 102   < >  --    < > 104 103 107  CO2 21* 21*   < >  --    < > 20 20 27   GLUCOSE 125* 158*   < >  --    < > 104* 94 111*  BUN 21 26*   < >  --    < > 17 29* 25  CREATININE 1.10 1.74*   < >  --    < > 0.95 0.84 1.00  CALCIUM  7.7* 7.8*   < >  --    < > 9.0 8.9 8.8  MG 2.6* 2.5*  --  2.0  --   --   --   --   TSH  --   --   --   --   --   --  2.180  --    < > = values in this interval not displayed.   Liver Function Tests: Recent Labs    04/20/23 1103 05/17/23 0832 06/07/23 1130 08/31/23 1320 11/29/23 0821  AST 24   < > 26 19 20   ALT 33   < > 30 18 17   ALKPHOS 84  --  52 84  --   BILITOT 0.5   < > 0.8 0.5 0.5  PROT 6.5   < > 6.5 6.5 6.1  ALBUMIN  4.0  --  3.5 4.0  --    < > = values in this interval not displayed.   No results for input(s): LIPASE, AMYLASE in the last 8760 hours. No results for input(s): AMMONIA in the last 8760 hours. CBC: Recent Labs    05/17/23 0832 06/07/23 1130 06/21/23 1458 08/31/23 1320 11/29/23 0821  WBC 5.8   < > 15.3* 8.0 6.8  NEUTROABS 4,002  --  12,072*  --  4,481  HGB 13.1*   < > 11.5* 12.8* 13.0*  HCT 40.6   < > 34.5* 40.6 41.1  MCV 96.0   < > 95.8 95 94.7  PLT 178   < > 426* 200 190   < > = values in this interval not displayed.   Lipid Panel: Recent Labs    04/20/23 1100 05/17/23 0832 11/29/23 0821  CHOL 106 108 102  HDL 54 61 63  LDLCALC 41 33 28  TRIG 41 49 37  CHOLHDL 2.0 1.8 1.6   TSH: Recent Labs    08/31/23 1320  TSH 2.180   A1C: Lab Results  Component Value Date  HGBA1C 6.0 (H) 11/29/2023     Assessment/Plan  Coronary  artery disease involving coronary bypass graft of native heart without angina pectoris Assessment & Plan: S/p CABG x4, without chest pains at this time.  Continues on ASA 325 mg daily with metoprolol  and ramipril    Essential hypertension, benign Assessment & Plan: Blood pressure well controlled, goal bp <140/90 Continue current medications and dietary modifications follow metabolic panel   Major depressive disorder in full remission, unspecified whether recurrent Assessment & Plan: Stable, Continues on zoloft  50 mg daily   Class 1 obesity due to excess calories with serious comorbidity and body mass index (BMI) of 31.0 to 31.9 in adult Assessment & Plan: -he is working on losing weight, education provided on healthy weight loss through increase in physical activity and proper nutrition.   Gastroesophageal reflux disease without esophagitis Assessment & Plan: GERD symptoms well-managed with omeprazole. - Attempt to reduce omeprazole to every other day. - Encourage continued dietary modifications to prevent GERD.   Primary osteoarthritis of right hand Assessment & Plan: Osteoarthritis affecting hand joints with joint popping and reduced grip strength during flare-ups. Managed with Voltaren gel. - Continue using Voltaren gel for symptomatic relief. - Encourage warm water therapy for symptomatic relief. - Avoid NSAIDs due to cardiac and renal considerations. - Consider orthopedic referral   Hyperlipidemia LDL goal <55 Assessment & Plan: Cholesterol well controlled on lipitor  80 mg daily with zetia  10 mg daily    Anemia, unspecified type   Mild anemia with hemoglobin levels between 11.5 and 13.0. Possible dietary iron deficiency considered. Kidney function normal. - Order iron profile to assess for iron deficiency. - Increase dietary iron intake if iron deficiency is confirmed.   Return in about 6 months (around 06/01/2024) for routine follow up, labs prior to  visit.:  [place] Athanas K. Caro BODILY [facility] [phone]

## 2023-12-03 LAB — HEMOGLOBIN A1C
Hgb A1c MFr Bld: 6 % — ABNORMAL HIGH (ref <5.7)
Mean Plasma Glucose: 126 mg/dL
eAG (mmol/L): 7 mmol/L

## 2023-12-03 LAB — CBC WITH DIFFERENTIAL/PLATELET
Absolute Lymphocytes: 1272 {cells}/uL (ref 850–3900)
Absolute Monocytes: 734 {cells}/uL (ref 200–950)
Basophils Absolute: 48 {cells}/uL (ref 0–200)
Basophils Relative: 0.7 %
Eosinophils Absolute: 265 {cells}/uL (ref 15–500)
Eosinophils Relative: 3.9 %
HCT: 41.1 % (ref 38.5–50.0)
Hemoglobin: 13 g/dL — ABNORMAL LOW (ref 13.2–17.1)
MCH: 30 pg (ref 27.0–33.0)
MCHC: 31.6 g/dL — ABNORMAL LOW (ref 32.0–36.0)
MCV: 94.7 fL (ref 80.0–100.0)
MPV: 10.1 fL (ref 7.5–12.5)
Monocytes Relative: 10.8 %
Neutro Abs: 4481 {cells}/uL (ref 1500–7800)
Neutrophils Relative %: 65.9 %
Platelets: 190 Thousand/uL (ref 140–400)
RBC: 4.34 Million/uL (ref 4.20–5.80)
RDW: 13.8 % (ref 11.0–15.0)
Total Lymphocyte: 18.7 %
WBC: 6.8 Thousand/uL (ref 3.8–10.8)

## 2023-12-03 LAB — COMPLETE METABOLIC PANEL WITHOUT GFR
AG Ratio: 1.8 (calc) (ref 1.0–2.5)
ALT: 17 U/L (ref 9–46)
AST: 20 U/L (ref 10–35)
Albumin: 3.9 g/dL (ref 3.6–5.1)
Alkaline phosphatase (APISO): 63 U/L (ref 35–144)
BUN: 25 mg/dL (ref 7–25)
CO2: 27 mmol/L (ref 20–32)
Calcium: 8.8 mg/dL (ref 8.6–10.3)
Chloride: 107 mmol/L (ref 98–110)
Creat: 1 mg/dL (ref 0.70–1.22)
Globulin: 2.2 g/dL (ref 1.9–3.7)
Glucose, Bld: 111 mg/dL — ABNORMAL HIGH (ref 65–99)
Potassium: 4.2 mmol/L (ref 3.5–5.3)
Sodium: 140 mmol/L (ref 135–146)
Total Bilirubin: 0.5 mg/dL (ref 0.2–1.2)
Total Protein: 6.1 g/dL (ref 6.1–8.1)

## 2023-12-03 LAB — LIPID PANEL
Cholesterol: 102 mg/dL (ref <200)
HDL: 63 mg/dL (ref >=40)
LDL Cholesterol (Calc): 28 mg/dL
Non-HDL Cholesterol (Calc): 39 mg/dL (ref <130)
Total CHOL/HDL Ratio: 1.6 (calc) (ref <5.0)
Triglycerides: 37 mg/dL (ref <150)

## 2023-12-03 LAB — TEST AUTHORIZATION

## 2023-12-03 LAB — IRON,TIBC AND FERRITIN PANEL
%SAT: 21 % (ref 20–48)
Ferritin: 12 ng/mL — ABNORMAL LOW (ref 24–380)
Iron: 66 ug/dL (ref 50–180)
TIBC: 307 ug/dL (ref 250–425)

## 2023-12-11 ENCOUNTER — Ambulatory Visit: Payer: Self-pay | Admitting: Nurse Practitioner

## 2023-12-12 NOTE — Telephone Encounter (Signed)
 I left a detail voicemail in regards to make an appointment for labs.

## 2023-12-19 ENCOUNTER — Other Ambulatory Visit: Payer: Self-pay | Admitting: Internal Medicine

## 2024-01-19 ENCOUNTER — Other Ambulatory Visit: Payer: Self-pay

## 2024-01-19 ENCOUNTER — Other Ambulatory Visit

## 2024-01-19 DIAGNOSIS — D649 Anemia, unspecified: Secondary | ICD-10-CM

## 2024-01-20 LAB — IRON,TIBC AND FERRITIN PANEL
%SAT: 36 % (ref 20–48)
Ferritin: 21 ng/mL — ABNORMAL LOW (ref 24–380)
Iron: 108 ug/dL (ref 50–180)
TIBC: 298 ug/dL (ref 250–425)

## 2024-01-23 ENCOUNTER — Ambulatory Visit: Payer: Self-pay | Admitting: Nurse Practitioner

## 2024-03-04 ENCOUNTER — Encounter: Payer: Self-pay | Admitting: Nurse Practitioner

## 2024-03-04 DIAGNOSIS — M1990 Unspecified osteoarthritis, unspecified site: Secondary | ICD-10-CM

## 2024-03-12 ENCOUNTER — Other Ambulatory Visit: Payer: Self-pay | Admitting: Internal Medicine

## 2024-03-26 ENCOUNTER — Ambulatory Visit: Admitting: Orthopedic Surgery

## 2024-05-09 ENCOUNTER — Ambulatory Visit: Admitting: Internal Medicine

## 2024-05-28 ENCOUNTER — Other Ambulatory Visit: Payer: Self-pay

## 2024-06-01 ENCOUNTER — Ambulatory Visit: Payer: Self-pay | Admitting: Nurse Practitioner
